# Patient Record
Sex: Male | Born: 1955
Health system: Southern US, Community
[De-identification: ages and names within clinical notes are randomized; demographics above are authoritative.]

## PROBLEM LIST (undated history)

## (undated) DIAGNOSIS — K219 Gastro-esophageal reflux disease without esophagitis: Secondary | ICD-10-CM

## (undated) DIAGNOSIS — M1990 Unspecified osteoarthritis, unspecified site: Secondary | ICD-10-CM

## (undated) DIAGNOSIS — H34211 Partial retinal artery occlusion, right eye: Secondary | ICD-10-CM

## (undated) DIAGNOSIS — I10 Essential (primary) hypertension: Secondary | ICD-10-CM

## (undated) DIAGNOSIS — R519 Headache, unspecified: Secondary | ICD-10-CM

## (undated) DIAGNOSIS — I499 Cardiac arrhythmia, unspecified: Secondary | ICD-10-CM

## (undated) DIAGNOSIS — G510 Bell's palsy: Secondary | ICD-10-CM

## (undated) DIAGNOSIS — R51 Headache: Secondary | ICD-10-CM

## (undated) HISTORY — PX: COLONOSCOPY: SHX174

## (undated) HISTORY — PX: CIRCUMCISION: SUR203

## (undated) HISTORY — PX: JOINT REPLACEMENT: SHX530

---

## 2005-06-06 ENCOUNTER — Emergency Department: Payer: Self-pay | Admitting: Emergency Medicine

## 2007-07-20 ENCOUNTER — Emergency Department: Payer: Self-pay | Admitting: Emergency Medicine

## 2008-09-06 DIAGNOSIS — G510 Bell's palsy: Secondary | ICD-10-CM

## 2008-09-06 HISTORY — DX: Bell's palsy: G51.0

## 2009-07-28 ENCOUNTER — Emergency Department: Payer: Self-pay | Admitting: Emergency Medicine

## 2009-08-20 ENCOUNTER — Emergency Department: Payer: Self-pay | Admitting: Emergency Medicine

## 2010-02-02 ENCOUNTER — Emergency Department: Payer: Self-pay | Admitting: Emergency Medicine

## 2010-02-03 ENCOUNTER — Emergency Department: Payer: Self-pay | Admitting: Emergency Medicine

## 2010-03-27 ENCOUNTER — Emergency Department (HOSPITAL_COMMUNITY): Admission: EM | Admit: 2010-03-27 | Discharge: 2010-03-28 | Payer: Self-pay | Admitting: Emergency Medicine

## 2011-02-28 ENCOUNTER — Emergency Department: Payer: Self-pay | Admitting: Emergency Medicine

## 2012-08-08 ENCOUNTER — Other Ambulatory Visit: Payer: Self-pay | Admitting: Orthopedic Surgery

## 2012-08-25 ENCOUNTER — Encounter (HOSPITAL_COMMUNITY): Admission: RE | Payer: Self-pay | Source: Ambulatory Visit

## 2012-08-25 ENCOUNTER — Ambulatory Visit (HOSPITAL_COMMUNITY): Admission: RE | Admit: 2012-08-25 | Payer: 59 | Source: Ambulatory Visit | Admitting: Orthopedic Surgery

## 2012-08-25 SURGERY — ARTHROPLASTY, KNEE, TOTAL
Anesthesia: General | Laterality: Right

## 2015-01-14 ENCOUNTER — Emergency Department (HOSPITAL_COMMUNITY)
Admission: EM | Admit: 2015-01-14 | Discharge: 2015-01-14 | Disposition: A | Discharge status: Home / Self Care | Payer: No Typology Code available for payment source | Attending: Emergency Medicine | Admitting: Emergency Medicine

## 2015-01-14 ENCOUNTER — Emergency Department (HOSPITAL_COMMUNITY): Payer: No Typology Code available for payment source

## 2015-01-14 ENCOUNTER — Encounter (HOSPITAL_COMMUNITY): Payer: Self-pay | Admitting: *Deleted

## 2015-01-14 DIAGNOSIS — S46911A Strain of unspecified muscle, fascia and tendon at shoulder and upper arm level, right arm, initial encounter: Secondary | ICD-10-CM | POA: Diagnosis not present

## 2015-01-14 DIAGNOSIS — Z7982 Long term (current) use of aspirin: Secondary | ICD-10-CM | POA: Diagnosis not present

## 2015-01-14 DIAGNOSIS — Y9389 Activity, other specified: Secondary | ICD-10-CM | POA: Diagnosis not present

## 2015-01-14 DIAGNOSIS — Y92410 Unspecified street and highway as the place of occurrence of the external cause: Secondary | ICD-10-CM | POA: Insufficient documentation

## 2015-01-14 DIAGNOSIS — Y998 Other external cause status: Secondary | ICD-10-CM | POA: Insufficient documentation

## 2015-01-14 DIAGNOSIS — Z79899 Other long term (current) drug therapy: Secondary | ICD-10-CM | POA: Insufficient documentation

## 2015-01-14 DIAGNOSIS — S161XXA Strain of muscle, fascia and tendon at neck level, initial encounter: Secondary | ICD-10-CM | POA: Diagnosis not present

## 2015-01-14 DIAGNOSIS — S4991XA Unspecified injury of right shoulder and upper arm, initial encounter: Secondary | ICD-10-CM | POA: Diagnosis present

## 2015-01-14 MED ORDER — ORPHENADRINE CITRATE ER 100 MG PO TB12: 100.0000 mg | ORAL_TABLET | Freq: Two times a day (BID) | ORAL | Status: DC | PRN

## 2015-01-14 MED ORDER — HYDROCODONE-ACETAMINOPHEN 5-325 MG PO TABS
2.0000 | ORAL_TABLET | Freq: Once | ORAL | Status: AC
  Administered 2015-01-14: 2 via ORAL
  Filled 2015-01-14: qty 2
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
## 2015-01-14 NOTE — ED Notes (Signed)
Pt is upset that dr Jodi MourningZavitz wouldn't give him prescription for Vicodin. Explained to pt there are multiple ways to manage pain besides narcotics, he voiced understanding.

## 2015-01-14 NOTE — ED Notes (Signed)
Bed: WA14 Expected date:  Expected time:  Means of arrival:  Comments: EMS 

## 2015-01-14 NOTE — Discharge Instructions (Signed)
Take Tylenol as needed for pain every 4 hours. Use ice or heat as needed and tolerated. For muscle spasms or muscle tension try muscle relaxant.  If you were given medicines take as directed.  If you are on coumadin or contraceptives realize their levels and effectiveness is altered by many different medicines.  If you have any reaction (rash, tongues swelling, other) to the medicines stop taking and see a physician.   Please follow up as directed and return to the ER or see a physician for new or worsening symptoms.  Thank you. Filed Vitals:   01/14/15 1633 01/14/15 1640  BP:  153/77  Pulse:  105  Temp:  97.6 F (36.4 C)  TempSrc:  Oral  Resp:  20  SpO2: 98% 96%

## 2015-01-14 NOTE — ED Notes (Signed)
Pt requesting prescription for narcotic pain medications, Dr Jodi MourningZavitz notified.

## 2015-02-26 ENCOUNTER — Emergency Department
Admission: EM | Admit: 2015-02-26 | Discharge: 2015-02-26 | Discharge status: Against Medical Advice | Payer: Self-pay | Attending: Emergency Medicine | Admitting: Emergency Medicine

## 2015-02-26 DIAGNOSIS — M25561 Pain in right knee: Secondary | ICD-10-CM | POA: Insufficient documentation

## 2015-02-26 DIAGNOSIS — M25562 Pain in left knee: Secondary | ICD-10-CM | POA: Insufficient documentation

## 2015-02-26 DIAGNOSIS — M545 Low back pain: Secondary | ICD-10-CM | POA: Insufficient documentation
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
## 2015-02-27 ENCOUNTER — Encounter: Payer: Self-pay | Admitting: Emergency Medicine

## 2015-02-27 ENCOUNTER — Emergency Department
Admission: EM | Admit: 2015-02-27 | Discharge: 2015-02-27 | Disposition: A | Discharge status: Home / Self Care | Payer: Self-pay | Attending: Emergency Medicine | Admitting: Emergency Medicine

## 2015-02-27 DIAGNOSIS — M170 Bilateral primary osteoarthritis of knee: Secondary | ICD-10-CM | POA: Insufficient documentation

## 2015-02-27 DIAGNOSIS — Z7982 Long term (current) use of aspirin: Secondary | ICD-10-CM | POA: Insufficient documentation

## 2015-02-27 DIAGNOSIS — G8929 Other chronic pain: Secondary | ICD-10-CM | POA: Insufficient documentation

## 2015-02-27 DIAGNOSIS — Z79899 Other long term (current) drug therapy: Secondary | ICD-10-CM | POA: Insufficient documentation

## 2015-02-27 DIAGNOSIS — S46911D Strain of unspecified muscle, fascia and tendon at shoulder and upper arm level, right arm, subsequent encounter: Secondary | ICD-10-CM | POA: Insufficient documentation

## 2015-02-27 MED ORDER — PREDNISONE 10 MG PO TABS: 10.0000 mg | ORAL_TABLET | Freq: Two times a day (BID) | ORAL | Status: DC

## 2015-02-27 MED ORDER — MELOXICAM 15 MG PO TABS: 15.0000 mg | ORAL_TABLET | Freq: Every day | ORAL | Status: DC

## 2015-02-27 MED ORDER — TRAMADOL HCL 50 MG PO TABS: 50.0000 mg | ORAL_TABLET | Freq: Two times a day (BID) | ORAL | Status: DC

## 2015-02-27 MED ORDER — ORPHENADRINE CITRATE 30 MG/ML IJ SOLN
60.0000 mg | INTRAMUSCULAR | Status: AC
  Administered 2015-02-27: 60 mg via INTRAMUSCULAR

## 2015-02-27 MED ORDER — KETOROLAC TROMETHAMINE 60 MG/2ML IM SOLN
60.0000 mg | Freq: Once | INTRAMUSCULAR | Status: AC
  Administered 2015-02-27: 60 mg via INTRAMUSCULAR

## 2015-02-27 MED ORDER — KETOROLAC TROMETHAMINE 60 MG/2ML IM SOLN
INTRAMUSCULAR | Status: AC
  Administered 2015-02-27: 60 mg via INTRAMUSCULAR
  Filled 2015-02-27: qty 2

## 2015-02-27 MED ORDER — ORPHENADRINE CITRATE 30 MG/ML IJ SOLN
INTRAMUSCULAR | Status: AC
  Administered 2015-02-27: 60 mg via INTRAMUSCULAR
  Filled 2015-02-27: qty 2
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
## 2015-02-27 NOTE — Discharge Instructions (Signed)
Osteoarthritis Osteoarthritis is a disease that causes soreness and inflammation of a joint. It occurs when the cartilage at the affected joint wears down. Cartilage acts as a cushion, covering the ends of bones where they meet to form a joint. Osteoarthritis is the most common form of arthritis. It often occurs in older people. The joints affected most often by this condition include those in the:  Ends of the fingers.  Thumbs.  Neck.  Lower back.  Knees.  Hips. CAUSES  Over time, the cartilage that covers the ends of bones begins to wear away. This causes bone to rub on bone, producing pain and stiffness in the affected joints.  RISK FACTORS Certain factors can increase your chances of having osteoarthritis, including:  Older age.  Excessive body weight.  Overuse of joints.  Previous joint injury. SIGNS AND SYMPTOMS   Pain, swelling, and stiffness in the joint.  Over time, the joint may lose its normal shape.  Small deposits of bone (osteophytes) may grow on the edges of the joint.  Bits of bone or cartilage can break off and float inside the joint space. This may cause more pain and damage. DIAGNOSIS  Your health care provider will do a physical exam and ask about your symptoms. Various tests may be ordered, such as:  X-rays of the affected joint.  An MRI scan.  Blood tests to rule out other types of arthritis.  Joint fluid tests. This involves using a needle to draw fluid from the joint and examining the fluid under a microscope. TREATMENT  Goals of treatment are to control pain and improve joint function. Treatment plans may include:  A prescribed exercise program that allows for rest and joint relief.  A weight control plan.  Pain relief techniques, such as:  Properly applied heat and cold.  Electric pulses delivered to nerve endings under the skin (transcutaneous electrical nerve stimulation [TENS]).  Massage.  Certain nutritional  supplements.  Medicines to control pain, such as:  Acetaminophen.  Nonsteroidal anti-inflammatory drugs (NSAIDs), such as naproxen.  Narcotic or central-acting agents, such as tramadol.  Corticosteroids. These can be given orally or as an injection.  Surgery to reposition the bones and relieve pain (osteotomy) or to remove loose pieces of bone and cartilage. Joint replacement may be needed in advanced states of osteoarthritis. HOME CARE INSTRUCTIONS   Take medicines only as directed by your health care provider.  Maintain a healthy weight. Follow your health care provider's instructions for weight control. This may include dietary instructions.  Exercise as directed. Your health care provider can recommend specific types of exercise. These may include:  Strengthening exercises. These are done to strengthen the muscles that support joints affected by arthritis. They can be performed with weights or with exercise bands to add resistance.  Aerobic activities. These are exercises, such as brisk walking or low-impact aerobics, that get your heart pumping.  Range-of-motion activities. These keep your joints limber.  Balance and agility exercises. These help you maintain daily living skills.  Rest your affected joints as directed by your health care provider.  Keep all follow-up visits as directed by your health care provider. SEEK MEDICAL CARE IF:   Your skin turns red.  You develop a rash in addition to your joint pain.  You have worsening joint pain.  You have a fever along with joint or muscle aches. SEEK IMMEDIATE MEDICAL CARE IF: 1. You have a significant loss of weight or appetite. 2. You have night sweats. FOR MORE  INFORMATION  1. [employer] of Arthritis and Musculoskeletal and Skin Diseases: www.niams.http://www.myers.net/ 2. [employer] on Aging: https://walker.com/ 3. [employer] of Rheumatology: www.rheumatology.org Document Released: 08/23/2005 Document  Revised: 01/07/2014 Document Reviewed: 04/30/2013 [facility] Patient Information 2015 [place], [place]. This information is not intended to replace advice given to you by your health care provider. Make sure you discuss any questions you have with your health care provider.   Shoulder Sprain A shoulder sprain is the result of damage to the tough, fiber-like tissues (ligaments) that help hold your shoulder in place. The ligaments may be stretched or torn. Besides the main shoulder joint (the ball and socket), there are several smaller joints that connect the bones in this area. A sprain usually involves one of those joints. Most often it is the acromioclavicular (or AC) joint. That is the joint that connects the collarbone (clavicle) and the shoulder blade (scapula) at the top point of the shoulder blade (acromion). A shoulder sprain is a mild form of what is called a shoulder separation. Recovering from a shoulder sprain may take some time. For some, pain lingers for several months. Most people recover without long term problems. CAUSES   A shoulder sprain is usually caused by some kind of trauma. This might be:  Falling on an outstretched arm.  Being hit hard on the shoulder.  Twisting the arm.  Shoulder sprains are more likely to occur in people who:  Play sports.  Have balance or coordination problems. SYMPTOMS   Pain when you move your shoulder.  Limited ability to move the shoulder.  Swelling and tenderness on top of the shoulder.  Redness or warmth in the shoulder.  Bruising.  A change in the shape of the shoulder. DIAGNOSIS  Your healthcare provider may:  Ask about your symptoms.  Ask about recent activity that might have caused those symptoms.  Examine your shoulder. You may be asked to do simple exercises to test movement. The other shoulder will be examined for comparison.  Order some tests that provide a look inside the body. They can show the extent of the injury.  The tests could include:  X-rays.  CT (computed tomography) scan.  MRI (magnetic resonance imaging) scan. RISKS AND COMPLICATIONS  Loss of full shoulder motion.  Ongoing shoulder pain. TREATMENT  How long it takes to recover from a shoulder sprain depends on how severe it was. Treatment options may include:  Rest. You should not use the arm or shoulder until it heals.  Ice. For 2 or 3 days after the injury, put an ice pack on the shoulder up to 4 times a day. It should stay on for 15 to 20 minutes each time. Wrap the ice in a towel so it does not touch your skin.  Over-the-counter medicine to relieve pain.  A sling or brace. This will keep the arm still while the shoulder is healing.  Physical therapy or rehabilitation exercises. These will help you regain strength and motion. Ask your healthcare provider when it is OK to begin these exercises.  Surgery. The need for surgery is rare with a sprained shoulder, but some people may need surgery to keep the joint in place and reduce pain. HOME CARE INSTRUCTIONS   Ask your healthcare provider about what you should and should not do while your shoulder heals.  Make sure you know how to apply ice to the correct area of your shoulder.  Talk with your healthcare provider about which medications should be used for pain  and swelling.  If rehabilitation therapy will be needed, ask your healthcare provider to refer you to a therapist. If it is not recommended, then ask about at-home exercises. Find out when exercise should begin. SEEK MEDICAL CARE IF:  Your pain, swelling, or redness at the joint increases. SEEK IMMEDIATE MEDICAL CARE IF:   You have a fever.  You cannot move your arm or shoulder. Document Released: 01/09/2009 Document Revised: 11/15/2011 Document Reviewed: 01/09/2009 [facility] Patient Information 2015 [place], [place]. This information is not intended to replace advice given to you by your health care provider. Make sure  you discuss any questions you have with your health care provider.  Take the prescription meds as directed.  Take the Prednisone until complete, then start the Meloxicam daily for arthritis pain. Rest and ice the joints for comfort. Determine eligibility for Medicaid benefits, then follow-up with Dr. Luiz Blare for re-evaluation for surgery.

## 2015-03-18 ENCOUNTER — Other Ambulatory Visit (HOSPITAL_COMMUNITY): Payer: Self-pay | Admitting: Orthopaedic Surgery

## 2015-03-18 DIAGNOSIS — M25511 Pain in right shoulder: Secondary | ICD-10-CM

## 2015-03-27 ENCOUNTER — Ambulatory Visit (HOSPITAL_COMMUNITY)
Admission: RE | Admit: 2015-03-27 | Discharge: 2015-03-27 | Disposition: A | Discharge status: Home / Self Care | Payer: No Typology Code available for payment source | Source: Ambulatory Visit | Attending: Orthopaedic Surgery | Admitting: Orthopaedic Surgery

## 2015-03-27 DIAGNOSIS — M25511 Pain in right shoulder: Secondary | ICD-10-CM

## 2015-04-04 ENCOUNTER — Emergency Department
Admission: EM | Admit: 2015-04-04 | Discharge: 2015-04-04 | Disposition: A | Discharge status: Home / Self Care | Payer: Self-pay | Attending: Emergency Medicine | Admitting: Emergency Medicine

## 2015-04-04 DIAGNOSIS — Z79899 Other long term (current) drug therapy: Secondary | ICD-10-CM | POA: Insufficient documentation

## 2015-04-04 DIAGNOSIS — Z791 Long term (current) use of non-steroidal anti-inflammatories (NSAID): Secondary | ICD-10-CM | POA: Insufficient documentation

## 2015-04-04 DIAGNOSIS — L259 Unspecified contact dermatitis, unspecified cause: Secondary | ICD-10-CM | POA: Insufficient documentation

## 2015-04-04 DIAGNOSIS — L509 Urticaria, unspecified: Secondary | ICD-10-CM | POA: Insufficient documentation

## 2015-04-04 DIAGNOSIS — Z7982 Long term (current) use of aspirin: Secondary | ICD-10-CM | POA: Insufficient documentation

## 2015-04-04 MED ORDER — RANITIDINE HCL 150 MG PO TABS: 150.0000 mg | ORAL_TABLET | Freq: Two times a day (BID) | ORAL | Status: DC

## 2015-04-04 MED ORDER — DEXAMETHASONE SODIUM PHOSPHATE 10 MG/ML IJ SOLN
INTRAMUSCULAR | Status: AC
  Administered 2015-04-04: 4 mg
  Filled 2015-04-04: qty 1

## 2015-04-04 MED ORDER — FAMOTIDINE 20 MG PO TABS
20.0000 mg | ORAL_TABLET | Freq: Once | ORAL | Status: AC
  Administered 2015-04-04: 20 mg via ORAL
  Filled 2015-04-04: qty 1

## 2015-04-04 MED ORDER — DIPHENHYDRAMINE HCL 25 MG PO CAPS
ORAL_CAPSULE | ORAL | Status: AC
  Filled 2015-04-04: qty 1

## 2015-04-04 MED ORDER — HYDROXYZINE PAMOATE 25 MG PO CAPS: 25.0000 mg | ORAL_CAPSULE | Freq: Three times a day (TID) | ORAL | Status: DC | PRN

## 2015-04-04 MED ORDER — DEXAMETHASONE SODIUM PHOSPHATE 4 MG/ML IJ SOLN: 4.0000 mg | Freq: Once | INTRAMUSCULAR | Status: AC

## 2015-04-04 MED ORDER — PREDNISONE 10 MG PO TABS: 50.0000 mg | ORAL_TABLET | Freq: Every day | ORAL | Status: DC

## 2015-04-04 MED ORDER — DIPHENHYDRAMINE HCL 25 MG PO CAPS
25.0000 mg | ORAL_CAPSULE | Freq: Once | ORAL | Status: AC
  Administered 2015-04-04: 25 mg via ORAL

## 2015-04-04 MED ORDER — DIPHENHYDRAMINE HCL 50 MG/ML IJ SOLN
50.0000 mg | Freq: Once | INTRAMUSCULAR | Status: AC
  Administered 2015-04-04: 50 mg via INTRAMUSCULAR
  Filled 2015-04-04: qty 1

## 2015-04-04 NOTE — ED Provider Notes (Signed)
[facility] Emergency Department Provider Note  ____________________________________________  Time seen: Approximately 10:29 PM  I have reviewed the triage vital signs and the nursing notes.   HISTORY  Chief Complaint Rash    HPI Walter Hodges is a 59 y.o. male who presents for evaluation of eye itching erythematous rash in the groin and lower abdomen both arms and upper chest. Patient states that he is recently changed his soaps. Denies any shortness of breath chest pains or difficulty breathing.   No past medical history on file.  There are no active problems to display for this patient.   No past surgical history on file.  Current Outpatient Rx  Name  Route  Sig  Dispense  Refill  . aspirin EC 325 MG tablet   Oral   Take 325 mg by mouth daily.         . hydrochlorothiazide (HYDRODIURIL) 25 MG tablet   Oral   Take 25 mg by mouth daily.         . hydrOXYzine (VISTARIL) 25 MG capsule   Oral   Take 1 capsule (25 mg total) by mouth 3 (three) times daily as needed.   30 capsule   0   . lisinopril (PRINIVIL,ZESTRIL) 10 MG tablet   Oral   Take 10 mg by mouth daily.         . meloxicam (MOBIC) 15 MG tablet   Oral   Take 1 tablet (15 mg total) by mouth daily.   30 tablet   1   . orphenadrine (NORFLEX) 100 MG tablet   Oral   Take 1 tablet (100 mg total) by mouth 2 (two) times daily as needed for muscle spasms.   10 tablet   0   . predniSONE (DELTASONE) 10 MG tablet   Oral   Take 5 tablets (50 mg total) by mouth daily with breakfast.   25 tablet   0   . ranitidine (ZANTAC) 150 MG tablet   Oral   Take 1 tablet (150 mg total) by mouth 2 (two) times daily.   14 tablet   0   . traMADol (ULTRAM) 50 MG tablet   Oral   Take 1 tablet (50 mg total) by mouth 2 (two) times daily.   20 tablet   0     Allergies Review of patient's allergies indicates no known allergies.  No family history on file.  Social History History   Substance Use Topics  . Smoking status: Never Smoker   . Smokeless tobacco: Not on file  . Alcohol Use: No    Review of Systems Constitutional: No fever/chills Eyes: No visual changes. ENT: No sore throat. Cardiovascular: Denies chest pain. Respiratory: Denies shortness of breath. Gastrointestinal: No abdominal pain.  No nausea, no vomiting.  No diarrhea.  No constipation. Genitourinary: Negative for dysuria. Musculoskeletal: Negative for back pain. Skin: Positive for skin rash. Neurological: Negative for headaches, focal weakness or numbness.  10-point ROS otherwise negative.  ____________________________________________   PHYSICAL EXAM:  VITAL SIGNS: ED Triage Vitals  Enc Vitals Group     BP 04/04/15 2213 152/78 mmHg     Pulse Rate 04/04/15 2213 80     Resp 04/04/15 2213 16     Temp 04/04/15 2213 98.2 F (36.8 C)     Temp Source 04/04/15 2213 Oral     SpO2 04/04/15 2213 100 %     Weight 04/04/15 2213 248 lb (112.492 kg)     Height 04/04/15 2213  5\' 10"  (1.778 m)     Head Cir --      Peak Flow --      Pain Score --      Pain Loc --      Pain Edu? --      Excl. in GC? --     Constitutional: Alert and oriented. Well appearing and in no acute distress. Eyes: Conjunctivae are normal. PERRL. EOMI. Head: Atraumatic. Nose: No congestion/rhinnorhea. Mouth/Throat: Mucous membranes are moist.  Oropharynx non-erythematous. Neck: No stridor.   Cardiovascular: Normal rate, regular rhythm. Grossly normal heart sounds.  Good peripheral circulation. Respiratory: Normal respiratory effort.  No retractions. Lungs CTAB. Gastrointestinal: Soft and nontender. No distention. No abdominal bruits. No CVA tenderness. Musculoskeletal: No lower extremity tenderness nor edema.  No joint effusions. Neurologic:  Normal speech and language. No gross focal neurologic deficits are appreciated. No gait instability. Skin:  Skin is warm, dry and intact. No rash noted. Psychiatric: Mood and  affect are normal. Speech and behavior are normal.  ____________________________________________   LABS (all labs ordered are listed, but only abnormal results are displayed)  Labs Reviewed - No data to display    PROCEDURES  Procedure(s) performed: None  Critical Care performed: No  ____________________________________________   INITIAL IMPRESSION / ASSESSMENT AND PLAN / ED COURSE  Pertinent labs & imaging results that were available during my care of the patient were reviewed by me and considered in my medical decision making (see chart for details). Contact dermatitis/acute urticaria. There are Benadryl 50 mg IM, and on 4 mg IM and intact 150 by mouth given while in the ED. Rx given for prednisone, Vistaril, and Zantac. Patient to follow-up or return to the ER with any worsening symptomology. Patient voices no other emergency medical complaints at this visit. ____________________________________________   FINAL CLINICAL IMPRESSION(S) / ED DIAGNOSES  Final diagnoses:  Urticaria  Contact dermatitis      Evangeline Dakin, PA-C 04/04/15 2256  Arnaldo Natal, MD 04/04/15 2324

## 2015-04-04 NOTE — ED Notes (Signed)
Pt to ed from home due to rash on abd, arms and genitals area that's been going on for about 2 days now thinks it might be from changing the soaps.

## 2015-04-04 NOTE — ED Notes (Signed)
Pt states has itchy red rash that is confluent to groin, lower abd, bilateral arms, lateral upper chest. Pt states has recently changed soaps. Pt without shob, no difficulty swallowing.

## 2015-04-04 NOTE — Discharge Instructions (Signed)
Contact Dermatitis °Contact dermatitis is a reaction to certain substances that touch the skin. Contact dermatitis can be either irritant contact dermatitis or allergic contact dermatitis. Irritant contact dermatitis does not require previous exposure to the substance for a reaction to occur. Allergic contact dermatitis only occurs if you have been exposed to the substance before. Upon a repeat exposure, your body reacts to the substance.  °CAUSES  °Many substances can cause contact dermatitis. Irritant dermatitis is most commonly caused by repeated exposure to mildly irritating substances, such as: °· Makeup. °· Soaps. °· Detergents. °· Bleaches. °· Acids. °· Metal salts, such as nickel. °Allergic contact dermatitis is most commonly caused by exposure to: °· Poisonous plants. °· Chemicals (deodorants, shampoos). °· Jewelry. °· Latex. °· Neomycin in triple antibiotic cream. °· Preservatives in products, including clothing. °SYMPTOMS  °The area of skin that is exposed may develop: °· Dryness or flaking. °· Redness. °· Cracks. °· Itching. °· Pain or a burning sensation. °· Blisters. °With allergic contact dermatitis, there may also be swelling in areas such as the eyelids, mouth, or genitals.  °DIAGNOSIS  °Your caregiver can usually tell what the problem is by doing a physical exam. In cases where the cause is uncertain and an allergic contact dermatitis is suspected, a patch skin test may be performed to help determine the cause of your dermatitis. °TREATMENT °Treatment includes protecting the skin from further contact with the irritating substance by avoiding that substance if possible. Barrier creams, powders, and gloves may be helpful. Your caregiver may also recommend: °· Steroid creams or ointments applied 2 times daily. For best results, soak the rash area in cool water for 20 minutes. Then apply the medicine. Cover the area with a plastic wrap. You can store the steroid cream in the refrigerator for a "chilly"  effect on your rash. That may decrease itching. Oral steroid medicines may be needed in more severe cases. °· Antibiotics or antibacterial ointments if a skin infection is present. °· Antihistamine lotion or an antihistamine taken by mouth to ease itching. °· Lubricants to keep moisture in your skin. °· Burow's solution to reduce redness and soreness or to dry a weeping rash. Mix one packet or tablet of solution in 2 cups cool water. Dip a clean washcloth in the mixture, wring it out a bit, and put it on the affected area. Leave the cloth in place for 30 minutes. Do this as often as possible throughout the day. °· Taking several cornstarch or baking soda baths daily if the area is too large to cover with a washcloth. °Harsh chemicals, such as alkalis or acids, can cause skin damage that is like a burn. You should flush your skin for 15 to 20 minutes with cold water after such an exposure. You should also seek immediate medical care after exposure. Bandages (dressings), antibiotics, and pain medicine may be needed for severely irritated skin.  °HOME CARE INSTRUCTIONS °· Avoid the substance that caused your reaction. °· Keep the area of skin that is affected away from hot water, soap, sunlight, chemicals, acidic substances, or anything else that would irritate your skin. °· Do not scratch the rash. Scratching may cause the rash to become infected. °· You may take cool baths to help stop the itching. °· Only take over-the-counter or prescription medicines as directed by your caregiver. °· See your caregiver for follow-up care as directed to make sure your skin is healing properly. °SEEK MEDICAL CARE IF:  °· Your condition is not better after 3   days of treatment. °· You seem to be getting worse. °· You see signs of infection such as swelling, tenderness, redness, soreness, or warmth in the affected area. °· You have any problems related to your medicines. °Document Released: 08/20/2000 Document Revised: 11/15/2011  Document Reviewed: 01/26/2011 °ExitCare® Patient Information ©2015 [employer]. This information is not intended to replace advice given to you by your health care provider. Make sure you discuss any questions you have with your health care provider. °Hives °Hives are itchy, red, swollen areas of the skin. They can vary in size and location on your body. Hives can come and go for hours or several days (acute hives) or for several weeks (chronic hives). Hives do not spread from person to person (noncontagious). They may get worse with scratching, exercise, and emotional stress. °CAUSES  °Allergic reaction to food, additives, or drugs. °Infections, including the common cold. °Illness, such as vasculitis, lupus, or thyroid disease. °Exposure to sunlight, heat, or cold. °Exercise. °Stress. °Contact with chemicals. °SYMPTOMS  °Red or white swollen patches on the skin. The patches may change size, shape, and location quickly and repeatedly. °Itching. °Swelling of the hands, feet, and face. This may occur if hives develop deeper in the skin. °DIAGNOSIS  °Your caregiver can usually tell what is wrong by performing a physical exam. Skin or blood tests may also be done to determine the cause of your hives. In some cases, the cause cannot be determined. °TREATMENT  °Mild cases usually get better with medicines such as antihistamines. Severe cases may require an emergency epinephrine injection. If the cause of your hives is known, treatment includes avoiding that trigger.  °HOME CARE INSTRUCTIONS  °Avoid causes that trigger your hives. °Take antihistamines as directed by your caregiver to reduce the severity of your hives. Non-sedating or low-sedating antihistamines are usually recommended. Do not drive while taking an antihistamine. °Take any other medicines prescribed for itching as directed by your caregiver. °Wear loose-fitting clothing. °Keep all follow-up appointments as directed by your caregiver. °SEEK MEDICAL CARE  IF:  °You have persistent or severe itching that is not relieved with medicine. °You have painful or swollen joints. °SEEK IMMEDIATE MEDICAL CARE IF:  °You have a fever. °Your tongue or lips are swollen. °You have trouble breathing or swallowing. °You feel tightness in the throat or chest. °You have abdominal pain. °These problems may be the first sign of a life-threatening allergic reaction. Call your local emergency services (911 in [place] °MAKE SURE YOU:  °Understand these instructions. °Will watch your condition. °Will get help right away if you are not doing well or get worse. °Document Released: 08/23/2005 Document Revised: 08/28/2013 Document Reviewed: 11/16/2011 °ExitCare® Patient Information ©2015 [employer]. This information is not intended to replace advice given to you by your health care provider. Make sure you discuss any questions you have with your health care provider. ° °

## 2015-04-08 ENCOUNTER — Ambulatory Visit (HOSPITAL_COMMUNITY): Admission: RE | Admit: 2015-04-08 | Payer: No Typology Code available for payment source | Source: Ambulatory Visit

## 2015-04-09 ENCOUNTER — Ambulatory Visit (HOSPITAL_COMMUNITY): Admission: RE | Admit: 2015-04-09 | Payer: No Typology Code available for payment source | Source: Ambulatory Visit

## 2015-04-18 ENCOUNTER — Ambulatory Visit (HOSPITAL_COMMUNITY)
Admission: RE | Admit: 2015-04-18 | Discharge: 2015-04-18 | Disposition: A | Discharge status: Home / Self Care | Payer: Self-pay | Source: Ambulatory Visit | Attending: Orthopaedic Surgery | Admitting: Orthopaedic Surgery

## 2015-04-18 DIAGNOSIS — M25511 Pain in right shoulder: Secondary | ICD-10-CM | POA: Insufficient documentation

## 2015-04-18 DIAGNOSIS — M129 Arthropathy, unspecified: Secondary | ICD-10-CM | POA: Insufficient documentation

## 2015-04-18 DIAGNOSIS — M94211 Chondromalacia, right shoulder: Secondary | ICD-10-CM | POA: Insufficient documentation

## 2015-04-24 ENCOUNTER — Other Ambulatory Visit (HOSPITAL_COMMUNITY): Payer: Self-pay | Admitting: Orthopaedic Surgery

## 2015-05-15 NOTE — Pre-Procedure Instructions (Signed)
Walter Hodges  05/15/2015      [facility] - [place], [place] - [address] [place] [address] Phone: [phone] Fax: [phone]    Your procedure is scheduled on Monday, September 19th .  Report to [facility] Admitting at 10:30 AM  Call this number if you have problems the morning of surgery:  [phone]   Remember:  Do not eat food or drink liquids after midnight Sunday.            Take these medicines the morning of surgery with A SIP OF WATER :  No Medicine               Please STOP taking any herbal medications/supplements and anti-inflammatories ( DONOT TAKE MOTRIN AFTER 05/20/2015)   4-5 days prior to surgery.   Do not wear jewelry - no rings or watches.   Do not wear lotions or colognes.   You may NOT wear deodorant the day of surgery.              Men may shave face and neck.   Do not bring valuables to the hospital.   [facility] is not responsible for any belongings or valuables.  Contacts, dentures or bridgework may not be worn into surgery.  Leave your suitcase in the car.  After surgery it may be brought to your room.  For patients admitted to the hospital, discharge time will be determined by your treatment team.  Patients discharged the day of surgery will not be allowed to drive home.    Name and phone number of your driver:     Special instructions:  Special Instructions: [facility] - Preparing for Surgery  Before surgery, you can play an important role.  Because skin is not sterile, your skin needs to be as free of germs as possible.  You can reduce the number of germs on you skin by washing with CHG (chlorahexidine gluconate) soap before surgery.  CHG is an antiseptic cleaner which kills germs and bonds with the skin to continue killing germs even after washing.  Please DO NOT use if you have an allergy to CHG or antibacterial soaps.  If your skin becomes reddened/irritated stop  using the CHG and inform your nurse when you arrive at Short Stay.  Do not shave (including legs and underarms) for at least 48 hours prior to the first CHG shower.  You may shave your face.  Please follow these instructions carefully:   1.  Shower with CHG Soap the night before surgery and the  morning of Surgery.  2.  If you choose to wash your hair, wash your hair first as usual with your  normal shampoo.  3.  After you shampoo, rinse your hair and body thoroughly to remove the  Shampoo.  4.  Use CHG as you would any other liquid soap.  You can apply chg directly to the skin and wash gently with scrungie or a clean washcloth.  5.  Apply the CHG Soap to your body ONLY FROM THE NECK DOWN.    Do not use on open wounds or open sores.  Avoid contact with your eyes, ears, mouth and genitals (private parts).  Wash genitals (private parts)   with your normal soap.  6.  Wash thoroughly, paying special attention to the area where your surgery will be performed.  7.  Thoroughly rinse your body with warm water from the neck down.  8.  DO NOT shower/wash with your normal soap after using and rinsing off   the CHG Soap.  9.  Pat yourself dry with a clean towel.            10.  Wear clean pajamas.            11.  Place clean sheets on your bed the night of your first shower and do not sleep with pets.  Day of Surgery  Do not apply any lotions/deodorants the morning of surgery.  Please wear clean clothes to the hospital/surgery center.  Please read over the following fact sheets that you were given. Pain Booklet, Coughing and Deep Breathing, Blood Transfusion Information, MRSA Information and Surgical Site Infection Prevention

## 2015-05-16 ENCOUNTER — Encounter (HOSPITAL_COMMUNITY): Payer: Self-pay

## 2015-05-16 ENCOUNTER — Encounter (HOSPITAL_COMMUNITY)
Admission: RE | Admit: 2015-05-16 | Discharge: 2015-05-16 | Disposition: A | Discharge status: Home / Self Care | Payer: Self-pay | Source: Ambulatory Visit | Attending: Orthopaedic Surgery | Admitting: Orthopaedic Surgery

## 2015-05-16 DIAGNOSIS — Z0183 Encounter for blood typing: Secondary | ICD-10-CM | POA: Insufficient documentation

## 2015-05-16 DIAGNOSIS — M179 Osteoarthritis of knee, unspecified: Secondary | ICD-10-CM | POA: Insufficient documentation

## 2015-05-16 DIAGNOSIS — R9431 Abnormal electrocardiogram [ECG] [EKG]: Secondary | ICD-10-CM | POA: Insufficient documentation

## 2015-05-16 DIAGNOSIS — Z01812 Encounter for preprocedural laboratory examination: Secondary | ICD-10-CM | POA: Insufficient documentation

## 2015-05-16 HISTORY — DX: Bell's palsy: G51.0

## 2015-05-16 HISTORY — DX: Unspecified osteoarthritis, unspecified site: M19.90

## 2015-05-16 HISTORY — DX: Essential (primary) hypertension: I10

## 2015-05-16 LAB — COMPREHENSIVE METABOLIC PANEL
ALT: 27 U/L (ref 17–63)
ANION GAP: 8 (ref 5–15)
AST: 22 U/L (ref 15–41)
Albumin: 4 g/dL (ref 3.5–5.0)
Alkaline Phosphatase: 63 U/L (ref 38–126)
BUN: 18 mg/dL (ref 6–20)
CHLORIDE: 104 mmol/L (ref 101–111)
CO2: 28 mmol/L (ref 22–32)
Calcium: 9 mg/dL (ref 8.9–10.3)
Creatinine, Ser: 0.8 mg/dL (ref 0.61–1.24)
Glucose, Bld: 104 mg/dL — ABNORMAL HIGH (ref 65–99)
POTASSIUM: 3.4 mmol/L — AB (ref 3.5–5.1)
Sodium: 140 mmol/L (ref 135–145)
Total Bilirubin: 0.7 mg/dL (ref 0.3–1.2)
Total Protein: 7.3 g/dL (ref 6.5–8.1)

## 2015-05-16 LAB — CBC WITH DIFFERENTIAL/PLATELET
BASOS PCT: 0 % (ref 0–1)
Basophils Absolute: 0 10*3/uL (ref 0.0–0.1)
EOS ABS: 0.1 10*3/uL (ref 0.0–0.7)
Eosinophils Relative: 2 % (ref 0–5)
HEMATOCRIT: 41.4 % (ref 39.0–52.0)
HEMOGLOBIN: 13.8 g/dL (ref 13.0–17.0)
Lymphocytes Relative: 40 % (ref 12–46)
Lymphs Abs: 2.4 10*3/uL (ref 0.7–4.0)
MCH: 28.3 pg (ref 26.0–34.0)
MCHC: 33.3 g/dL (ref 30.0–36.0)
MCV: 84.8 fL (ref 78.0–100.0)
Monocytes Absolute: 0.6 10*3/uL (ref 0.1–1.0)
Monocytes Relative: 9 % (ref 3–12)
NEUTROS ABS: 2.9 10*3/uL (ref 1.7–7.7)
NEUTROS PCT: 49 % (ref 43–77)
Platelets: 238 10*3/uL (ref 150–400)
RBC: 4.88 MIL/uL (ref 4.22–5.81)
RDW: 13.6 % (ref 11.5–15.5)
WBC: 6 10*3/uL (ref 4.0–10.5)

## 2015-05-16 LAB — URINALYSIS, ROUTINE W REFLEX MICROSCOPIC
BILIRUBIN URINE: NEGATIVE
GLUCOSE, UA: NEGATIVE mg/dL
Hgb urine dipstick: NEGATIVE
KETONES UR: NEGATIVE mg/dL
LEUKOCYTES UA: NEGATIVE
Nitrite: NEGATIVE
PH: 6.5 (ref 5.0–8.0)
Protein, ur: NEGATIVE mg/dL
SPECIFIC GRAVITY, URINE: 1.022 (ref 1.005–1.030)
Urobilinogen, UA: 1 mg/dL (ref 0.0–1.0)

## 2015-05-16 LAB — SEDIMENTATION RATE: SED RATE: 8 mm/h (ref 0–16)

## 2015-05-16 LAB — SURGICAL PCR SCREEN
MRSA, PCR: NEGATIVE
Staphylococcus aureus: NEGATIVE

## 2015-05-16 LAB — C-REACTIVE PROTEIN

## 2015-05-16 LAB — PROTIME-INR
INR: 1.15 (ref 0.00–1.49)
PROTHROMBIN TIME: 14.9 s (ref 11.6–15.2)

## 2015-05-16 LAB — TYPE AND SCREEN
ABO/RH(D): A POS
ANTIBODY SCREEN: NEGATIVE

## 2015-05-16 LAB — ABO/RH: ABO/RH(D): A POS

## 2015-05-16 LAB — APTT: aPTT: 29 seconds (ref 24–37)

## 2015-05-16 NOTE — Progress Notes (Signed)
Pt. Reports that he is followed by Dr. Nita Sells at [address] , denies having an EKG in the past yr.  Pt. Denies ever having any advanced cardiac testing.

## 2015-05-16 NOTE — Progress Notes (Signed)
Call to interactive resource center where the med. Clinic is located, when called the med. Clinic, there was a recording. Do not know last name of "Dr. Nita Sells". The effort to find the PCP clinic was relative to sending sleep apnea scoring, but I was not successful in getting a fax no. To  Send to.

## 2015-05-16 NOTE — Progress Notes (Signed)
Call to Pharm. Tech for fine tuning of  med. List, pt. Reports that he absolutely doesn't take any meds. Except Lisinopril/ wHCTZ. Then later in the interview he reported use of Motrin . PRN

## 2015-05-26 ENCOUNTER — Encounter (HOSPITAL_COMMUNITY)
Admission: RE | Disposition: A | Discharge status: Home Health | Payer: Self-pay | Source: Ambulatory Visit | Attending: Orthopaedic Surgery

## 2015-05-26 ENCOUNTER — Inpatient Hospital Stay (HOSPITAL_COMMUNITY): Payer: Self-pay

## 2015-05-26 ENCOUNTER — Inpatient Hospital Stay (HOSPITAL_COMMUNITY)
Admission: RE | Admit: 2015-05-26 | Discharge: 2015-05-30 | DRG: 470 | Disposition: A | Discharge status: Home Health | Payer: Self-pay | Source: Ambulatory Visit | Attending: Orthopaedic Surgery | Admitting: Orthopaedic Surgery

## 2015-05-26 ENCOUNTER — Inpatient Hospital Stay (HOSPITAL_COMMUNITY): Payer: Self-pay | Admitting: Anesthesiology

## 2015-05-26 ENCOUNTER — Encounter (HOSPITAL_COMMUNITY): Payer: Self-pay | Admitting: Surgery

## 2015-05-26 DIAGNOSIS — Z79899 Other long term (current) drug therapy: Secondary | ICD-10-CM

## 2015-05-26 DIAGNOSIS — D62 Acute posthemorrhagic anemia: Secondary | ICD-10-CM | POA: Diagnosis not present

## 2015-05-26 DIAGNOSIS — Z96659 Presence of unspecified artificial knee joint: Secondary | ICD-10-CM

## 2015-05-26 DIAGNOSIS — M1711 Unilateral primary osteoarthritis, right knee: Principal | ICD-10-CM | POA: Diagnosis present

## 2015-05-26 DIAGNOSIS — Z96651 Presence of right artificial knee joint: Secondary | ICD-10-CM

## 2015-05-26 DIAGNOSIS — Z7982 Long term (current) use of aspirin: Secondary | ICD-10-CM

## 2015-05-26 DIAGNOSIS — I10 Essential (primary) hypertension: Secondary | ICD-10-CM | POA: Diagnosis present

## 2015-05-26 HISTORY — PX: TOTAL KNEE ARTHROPLASTY: SHX125

## 2015-05-26 SURGERY — ARTHROPLASTY, KNEE, TOTAL
Anesthesia: Monitor Anesthesia Care | Site: Knee | Laterality: Right

## 2015-05-26 MED ORDER — MIDAZOLAM HCL 2 MG/2ML IJ SOLN
INTRAMUSCULAR | Status: AC
  Filled 2015-05-26: qty 4

## 2015-05-26 MED ORDER — BUPIVACAINE LIPOSOME 1.3 % IJ SUSP
20.0000 mL | Freq: Once | INTRAMUSCULAR | Status: DC
  Filled 2015-05-26: qty 20

## 2015-05-26 MED ORDER — PROMETHAZINE HCL 25 MG/ML IJ SOLN: 6.2500 mg | INTRAMUSCULAR | Status: DC | PRN

## 2015-05-26 MED ORDER — DEXTROSE 5 % IV SOLN
500.0000 mg | Freq: Four times a day (QID) | INTRAVENOUS | Status: DC | PRN
  Filled 2015-05-26: qty 5

## 2015-05-26 MED ORDER — METOCLOPRAMIDE HCL 5 MG/ML IJ SOLN: 5.0000 mg | Freq: Three times a day (TID) | INTRAMUSCULAR | Status: DC | PRN

## 2015-05-26 MED ORDER — PROPOFOL 10 MG/ML IV BOLUS
INTRAVENOUS | Status: DC | PRN
  Administered 2015-05-26: 20 mg via INTRAVENOUS

## 2015-05-26 MED ORDER — HYDROMORPHONE HCL 1 MG/ML IJ SOLN
0.2500 mg | INTRAMUSCULAR | Status: DC | PRN
  Administered 2015-05-26 (×2): 0.5 mg via INTRAVENOUS

## 2015-05-26 MED ORDER — BUPIVACAINE LIPOSOME 1.3 % IJ SUSP
INTRAMUSCULAR | Status: DC | PRN
  Administered 2015-05-26: 20 mL

## 2015-05-26 MED ORDER — LACTATED RINGERS IV SOLN
INTRAVENOUS | Status: DC | PRN
  Administered 2015-05-26 (×2): via INTRAVENOUS

## 2015-05-26 MED ORDER — LISINOPRIL-HYDROCHLOROTHIAZIDE 20-12.5 MG PO TABS: 2.0000 | ORAL_TABLET | Freq: Every day | ORAL | Status: DC

## 2015-05-26 MED ORDER — SODIUM CHLORIDE 0.9 % IR SOLN
Status: DC | PRN
  Administered 2015-05-26: 3000 mL

## 2015-05-26 MED ORDER — BUPIVACAINE-EPINEPHRINE (PF) 0.5% -1:200000 IJ SOLN
INTRAMUSCULAR | Status: DC | PRN
  Administered 2015-05-26: 30 mL via PERINEURAL

## 2015-05-26 MED ORDER — PROMETHAZINE HCL 25 MG PO TABS: 25.0000 mg | ORAL_TABLET | Freq: Four times a day (QID) | ORAL | Status: DC | PRN

## 2015-05-26 MED ORDER — HYDROCHLOROTHIAZIDE 25 MG PO TABS
25.0000 mg | ORAL_TABLET | Freq: Every day | ORAL | Status: DC
  Administered 2015-05-26 – 2015-05-30 (×5): 25 mg via ORAL
  Filled 2015-05-26 (×5): qty 1

## 2015-05-26 MED ORDER — PROPOFOL 1000 MG/100ML IV EMUL
INTRAVENOUS | Status: AC
  Filled 2015-05-26: qty 200

## 2015-05-26 MED ORDER — HYDROMORPHONE HCL 1 MG/ML IJ SOLN
INTRAMUSCULAR | Status: AC
  Administered 2015-05-26: 0.5 mg via INTRAVENOUS
  Filled 2015-05-26: qty 1

## 2015-05-26 MED ORDER — DIPHENHYDRAMINE HCL 12.5 MG/5ML PO ELIX: 25.0000 mg | ORAL_SOLUTION | ORAL | Status: DC | PRN

## 2015-05-26 MED ORDER — ONDANSETRON HCL 4 MG/2ML IJ SOLN
INTRAMUSCULAR | Status: AC
  Filled 2015-05-26: qty 2

## 2015-05-26 MED ORDER — FENTANYL CITRATE (PF) 250 MCG/5ML IJ SOLN
INTRAMUSCULAR | Status: AC
  Filled 2015-05-26: qty 5

## 2015-05-26 MED ORDER — METHOCARBAMOL 500 MG PO TABS
500.0000 mg | ORAL_TABLET | Freq: Four times a day (QID) | ORAL | Status: DC | PRN
  Administered 2015-05-26 – 2015-05-30 (×8): 500 mg via ORAL
  Filled 2015-05-26 (×7): qty 1

## 2015-05-26 MED ORDER — ACETAMINOPHEN 650 MG RE SUPP: 650.0000 mg | Freq: Four times a day (QID) | RECTAL | Status: DC | PRN

## 2015-05-26 MED ORDER — PHENOL 1.4 % MT LIQD: 1.0000 | OROMUCOSAL | Status: DC | PRN

## 2015-05-26 MED ORDER — CEFAZOLIN SODIUM-DEXTROSE 2-3 GM-% IV SOLR
2.0000 g | INTRAVENOUS | Status: AC
  Administered 2015-05-26: 2 g via INTRAVENOUS
  Filled 2015-05-26: qty 50

## 2015-05-26 MED ORDER — PROPOFOL 10 MG/ML IV BOLUS
INTRAVENOUS | Status: AC
  Filled 2015-05-26: qty 20

## 2015-05-26 MED ORDER — METHOCARBAMOL 500 MG PO TABS
ORAL_TABLET | ORAL | Status: AC
Start: 2015-05-26 — End: 2015-05-27
  Filled 2015-05-26: qty 1

## 2015-05-26 MED ORDER — SODIUM CHLORIDE 0.9 % IJ SOLN
INTRAMUSCULAR | Status: DC | PRN
  Administered 2015-05-26: 40 mL

## 2015-05-26 MED ORDER — CHLORHEXIDINE GLUCONATE 4 % EX LIQD: 60.0000 mL | Freq: Once | CUTANEOUS | Status: DC

## 2015-05-26 MED ORDER — ASPIRIN EC 325 MG PO TBEC
325.0000 mg | DELAYED_RELEASE_TABLET | Freq: Two times a day (BID) | ORAL | Status: DC
  Administered 2015-05-26 – 2015-05-30 (×7): 325 mg via ORAL
  Filled 2015-05-26 (×7): qty 1

## 2015-05-26 MED ORDER — POVIDONE-IODINE 10 % EX SOLN
CUTANEOUS | Status: DC | PRN
  Administered 2015-05-26: 1 via TOPICAL

## 2015-05-26 MED ORDER — CEFAZOLIN SODIUM-DEXTROSE 2-3 GM-% IV SOLR
2.0000 g | Freq: Four times a day (QID) | INTRAVENOUS | Status: AC
  Administered 2015-05-26 (×2): 2 g via INTRAVENOUS
  Filled 2015-05-26 (×2): qty 50

## 2015-05-26 MED ORDER — MIDAZOLAM HCL 2 MG/2ML IJ SOLN
INTRAMUSCULAR | Status: AC
  Administered 2015-05-26: 2 mg
  Filled 2015-05-26: qty 2

## 2015-05-26 MED ORDER — HYDROMORPHONE HCL 1 MG/ML IJ SOLN
1.0000 mg | INTRAMUSCULAR | Status: DC | PRN
  Administered 2015-05-26 – 2015-05-28 (×6): 1 mg via INTRAVENOUS
  Filled 2015-05-26 (×6): qty 1

## 2015-05-26 MED ORDER — MORPHINE SULFATE (PF) 2 MG/ML IV SOLN
1.0000 mg | INTRAVENOUS | Status: DC | PRN
  Administered 2015-05-26: 1 mg via INTRAVENOUS
  Filled 2015-05-26: qty 1

## 2015-05-26 MED ORDER — SODIUM CHLORIDE 0.9 % IV SOLN
INTRAVENOUS | Status: DC
  Administered 2015-05-26: 1 mL via INTRAVENOUS
  Administered 2015-05-27: 09:00:00 via INTRAVENOUS

## 2015-05-26 MED ORDER — BUPIVACAINE IN DEXTROSE 0.75-8.25 % IT SOLN
INTRATHECAL | Status: DC | PRN
  Administered 2015-05-26: 2 mL via INTRATHECAL

## 2015-05-26 MED ORDER — ONDANSETRON HCL 4 MG PO TABS: 4.0000 mg | ORAL_TABLET | Freq: Four times a day (QID) | ORAL | Status: DC | PRN

## 2015-05-26 MED ORDER — METOCLOPRAMIDE HCL 5 MG PO TABS: 5.0000 mg | ORAL_TABLET | Freq: Three times a day (TID) | ORAL | Status: DC | PRN

## 2015-05-26 MED ORDER — PROPOFOL INFUSION 10 MG/ML OPTIME
INTRAVENOUS | Status: DC | PRN
  Administered 2015-05-26: 50 ug/kg/min via INTRAVENOUS

## 2015-05-26 MED ORDER — ALUM & MAG HYDROXIDE-SIMETH 200-200-20 MG/5ML PO SUSP: 30.0000 mL | ORAL | Status: DC | PRN

## 2015-05-26 MED ORDER — OXYCODONE HCL 5 MG PO TABS: 5.0000 mg | ORAL_TABLET | ORAL | Status: DC | PRN

## 2015-05-26 MED ORDER — FENTANYL CITRATE (PF) 100 MCG/2ML IJ SOLN
INTRAMUSCULAR | Status: DC | PRN
  Administered 2015-05-26: 100 ug via INTRAVENOUS

## 2015-05-26 MED ORDER — OXYCODONE HCL ER 10 MG PO T12A
10.0000 mg | EXTENDED_RELEASE_TABLET | Freq: Two times a day (BID) | ORAL | Status: DC
  Administered 2015-05-26 – 2015-05-30 (×8): 10 mg via ORAL
  Filled 2015-05-26 (×8): qty 1

## 2015-05-26 MED ORDER — LACTATED RINGERS IV SOLN
INTRAVENOUS | Status: DC
  Administered 2015-05-26: 11:00:00 via INTRAVENOUS

## 2015-05-26 MED ORDER — OXYCODONE HCL ER 10 MG PO T12A: 10.0000 mg | EXTENDED_RELEASE_TABLET | Freq: Two times a day (BID) | ORAL | Status: DC

## 2015-05-26 MED ORDER — SENNOSIDES-DOCUSATE SODIUM 8.6-50 MG PO TABS: 1.0000 | ORAL_TABLET | Freq: Every evening | ORAL | Status: DC | PRN

## 2015-05-26 MED ORDER — FENTANYL CITRATE (PF) 100 MCG/2ML IJ SOLN
INTRAMUSCULAR | Status: AC
  Administered 2015-05-26: 100 ug
  Filled 2015-05-26: qty 2

## 2015-05-26 MED ORDER — OXYCODONE HCL 5 MG PO TABS
5.0000 mg | ORAL_TABLET | ORAL | Status: DC | PRN
  Administered 2015-05-26: 10 mg via ORAL
  Administered 2015-05-26 – 2015-05-30 (×9): 15 mg via ORAL
  Administered 2015-05-30: 10 mg via ORAL
  Filled 2015-05-26 (×3): qty 3
  Filled 2015-05-26: qty 2
  Filled 2015-05-26 (×2): qty 3
  Filled 2015-05-26: qty 2
  Filled 2015-05-26 (×5): qty 3

## 2015-05-26 MED ORDER — ZOLPIDEM TARTRATE 5 MG PO TABS
10.0000 mg | ORAL_TABLET | Freq: Every evening | ORAL | Status: DC | PRN
  Administered 2015-05-26 – 2015-05-29 (×3): 10 mg via ORAL
  Filled 2015-05-26 (×3): qty 2

## 2015-05-26 MED ORDER — ACETAMINOPHEN 500 MG PO TABS
1000.0000 mg | ORAL_TABLET | Freq: Four times a day (QID) | ORAL | Status: AC
  Administered 2015-05-26 – 2015-05-27 (×4): 1000 mg via ORAL
  Filled 2015-05-26 (×4): qty 2

## 2015-05-26 MED ORDER — ASPIRIN EC 325 MG PO TBEC: 325.0000 mg | DELAYED_RELEASE_TABLET | Freq: Two times a day (BID) | ORAL | Status: DC

## 2015-05-26 MED ORDER — ONDANSETRON HCL 4 MG/2ML IJ SOLN
INTRAMUSCULAR | Status: DC | PRN
  Administered 2015-05-26: 4 mg via INTRAVENOUS

## 2015-05-26 MED ORDER — MIDAZOLAM HCL 5 MG/5ML IJ SOLN
INTRAMUSCULAR | Status: DC | PRN
  Administered 2015-05-26: 2 mg via INTRAVENOUS

## 2015-05-26 MED ORDER — ONDANSETRON HCL 4 MG/2ML IJ SOLN
4.0000 mg | Freq: Four times a day (QID) | INTRAMUSCULAR | Status: DC | PRN
  Administered 2015-05-26 – 2015-05-27 (×2): 4 mg via INTRAVENOUS
  Filled 2015-05-26 (×2): qty 2

## 2015-05-26 MED ORDER — MEPERIDINE HCL 25 MG/ML IJ SOLN: 6.2500 mg | INTRAMUSCULAR | Status: DC | PRN

## 2015-05-26 MED ORDER — 0.9 % SODIUM CHLORIDE (POUR BTL) OPTIME
TOPICAL | Status: DC | PRN
  Administered 2015-05-26: 1000 mL

## 2015-05-26 MED ORDER — LISINOPRIL 40 MG PO TABS
40.0000 mg | ORAL_TABLET | Freq: Every day | ORAL | Status: DC
  Administered 2015-05-26 – 2015-05-30 (×5): 40 mg via ORAL
  Filled 2015-05-26 (×5): qty 1

## 2015-05-26 MED ORDER — MENTHOL 3 MG MT LOZG: 1.0000 | LOZENGE | OROMUCOSAL | Status: DC | PRN

## 2015-05-26 MED ORDER — ACETAMINOPHEN 325 MG PO TABS: 650.0000 mg | ORAL_TABLET | Freq: Four times a day (QID) | ORAL | Status: DC | PRN

## 2015-05-26 MED ORDER — CELECOXIB 200 MG PO CAPS
200.0000 mg | ORAL_CAPSULE | Freq: Two times a day (BID) | ORAL | Status: DC
  Administered 2015-05-26 – 2015-05-27 (×2): 200 mg via ORAL
  Filled 2015-05-26 (×2): qty 1

## 2015-05-26 SURGICAL SUPPLY — 67 items
ALCOHOL ISOPROPYL (RUBBING) (MISCELLANEOUS) IMPLANT
BANDAGE ELASTIC 6 VELCRO ST LF (GAUZE/BANDAGES/DRESSINGS) ×6 IMPLANT
BANDAGE ESMARK 6X9 LF (GAUZE/BANDAGES/DRESSINGS) ×1 IMPLANT
BLADE SAG 18X100X1.27 (BLADE) ×6 IMPLANT
BLADE SAGITTAL 25.0X1.27X90 (BLADE) ×2 IMPLANT
BLADE SAGITTAL 25.0X1.27X90MM (BLADE) ×1
BLADE SAW SGTL 13.0X1.19X90.0M (BLADE) ×3 IMPLANT
BNDG ESMARK 6X9 LF (GAUZE/BANDAGES/DRESSINGS) ×3
BONE CEMENT PALACOSE (Orthopedic Implant) ×6 IMPLANT
BOWL SMART MIX CTS (DISPOSABLE) ×3 IMPLANT
CAPT KNEE TOTAL 3 ×3 IMPLANT
CEMENT BONE PALACOSE (Orthopedic Implant) ×2 IMPLANT
COVER SURGICAL LIGHT HANDLE (MISCELLANEOUS) ×3 IMPLANT
CUFF TOURNIQUET SINGLE 34IN LL (TOURNIQUET CUFF) ×3 IMPLANT
CUFF TOURNIQUET SINGLE 44IN (TOURNIQUET CUFF) IMPLANT
DERMABOND ADVANCED (GAUZE/BANDAGES/DRESSINGS) ×2
DERMABOND ADVANCED .7 DNX12 (GAUZE/BANDAGES/DRESSINGS) ×1 IMPLANT
DRAPE EXTREMITY T 121X128X90 (DRAPE) ×3 IMPLANT
DRAPE IMP U-DRAPE 54X76 (DRAPES) ×3 IMPLANT
DRAPE INCISE IOBAN 66X45 STRL (DRAPES) ×3 IMPLANT
DRAPE ORTHO SPLIT 77X108 STRL (DRAPES) ×4
DRAPE PROXIMA HALF (DRAPES) ×3 IMPLANT
DRAPE SURG 17X11 SM STRL (DRAPES) ×3 IMPLANT
DRAPE SURG ORHT 6 SPLT 77X108 (DRAPES) ×2 IMPLANT
DRSG ADAPTIC 3X8 NADH LF (GAUZE/BANDAGES/DRESSINGS) ×3 IMPLANT
DRSG AQUACEL AG ADV 3.5X14 (GAUZE/BANDAGES/DRESSINGS) ×3 IMPLANT
DRSG PAD ABDOMINAL 8X10 ST (GAUZE/BANDAGES/DRESSINGS) ×3 IMPLANT
DURAPREP 26ML APPLICATOR (WOUND CARE) ×9 IMPLANT
ELECT CAUTERY BLADE 6.4 (BLADE) ×3 IMPLANT
ELECT REM PT RETURN 9FT ADLT (ELECTROSURGICAL) ×3
ELECTRODE REM PT RTRN 9FT ADLT (ELECTROSURGICAL) ×1 IMPLANT
EVACUATOR 1/8 PVC DRAIN (DRAIN) IMPLANT
FACESHIELD WRAPAROUND (MASK) ×3 IMPLANT
GAUZE SPONGE 4X4 12PLY STRL (GAUZE/BANDAGES/DRESSINGS) ×3 IMPLANT
GAUZE XEROFORM 5X9 LF (GAUZE/BANDAGES/DRESSINGS) ×3 IMPLANT
GLOVE SURG SYN 7.5  E (GLOVE) ×4
GLOVE SURG SYN 7.5 E (GLOVE) ×2 IMPLANT
GOWN STRL REIN XL XLG (GOWN DISPOSABLE) ×9 IMPLANT
HANDPIECE INTERPULSE COAX TIP (DISPOSABLE) ×2
HOOD SURGICAL BLUE (PROTECTIVE WEAR) ×3 IMPLANT
KIT BASIN OR (CUSTOM PROCEDURE TRAY) ×3 IMPLANT
KIT ROOM TURNOVER OR (KITS) ×3 IMPLANT
MANIFOLD NEPTUNE II (INSTRUMENTS) ×3 IMPLANT
NEEDLE SPNL 18GX3.5 QUINCKE PK (NEEDLE) ×6 IMPLANT
NS IRRIG 1000ML POUR BTL (IV SOLUTION) ×3 IMPLANT
PACK TOTAL JOINT (CUSTOM PROCEDURE TRAY) ×3 IMPLANT
PACK UNIVERSAL I (CUSTOM PROCEDURE TRAY) ×3 IMPLANT
PAD ARMBOARD 7.5X6 YLW CONV (MISCELLANEOUS) ×6 IMPLANT
PADDING CAST COTTON 6X4 STRL (CAST SUPPLIES) ×3 IMPLANT
PEN SKIN MARKING BROAD (MISCELLANEOUS) ×3 IMPLANT
SET HNDPC FAN SPRY TIP SCT (DISPOSABLE) ×1 IMPLANT
SPONGE GAUZE 4X4 12PLY STER LF (GAUZE/BANDAGES/DRESSINGS) ×3 IMPLANT
STAPLER VISISTAT 35W (STAPLE) IMPLANT
SUCTION FRAZIER TIP 10 FR DISP (SUCTIONS) ×3 IMPLANT
SUT ETHILON 2 0 FS 18 (SUTURE) ×6 IMPLANT
SUT VIC AB 0 CT1 27 (SUTURE) ×4
SUT VIC AB 0 CT1 27XBRD ANBCTR (SUTURE) ×2 IMPLANT
SUT VIC AB 1 CT1 27 (SUTURE) ×4
SUT VIC AB 1 CT1 27XBRD ANBCTR (SUTURE) ×2 IMPLANT
SUT VIC AB 2-0 CT1 27 (SUTURE) ×6
SUT VIC AB 2-0 CT1 TAPERPNT 27 (SUTURE) ×3 IMPLANT
SYR 50ML LL SCALE MARK (SYRINGE) ×3 IMPLANT
TOWEL OR 17X24 6PK STRL BLUE (TOWEL DISPOSABLE) ×3 IMPLANT
TOWEL OR 17X26 10 PK STRL BLUE (TOWEL DISPOSABLE) ×3 IMPLANT
WATER STERILE IRR 1000ML POUR (IV SOLUTION) ×6 IMPLANT
WRAP KNEE MAXI GEL POST OP (GAUZE/BANDAGES/DRESSINGS) ×3 IMPLANT
YANKAUER SUCT BULB TIP NO VENT (SUCTIONS) ×3 IMPLANT

## 2015-05-26 NOTE — Discharge Instructions (Signed)

## 2015-05-26 NOTE — Transfer of Care (Signed)
Immediate Anesthesia Transfer of Care Note  Patient: MINOR IDEN  Procedure(s) Performed: Procedure(s): RIGHT TOTAL KNEE ARTHROPLASTY (Right)  Patient Location: PACU  Anesthesia Type:Spinal  Level of Consciousness: awake  Airway & Oxygen Therapy: Patient Spontanous Breathing and Patient connected to nasal cannula oxygen  Post-op Assessment: Report given to RN and Post -op Vital signs reviewed and stable  Post vital signs: Reviewed and stable  Last Vitals:  Filed Vitals:   05/26/15 1215  BP:   Pulse: 57  Temp:   Resp: 11    Complications: No apparent anesthesia complications

## 2015-05-26 NOTE — Progress Notes (Signed)
Orthopedic Tech Progress Note Patient Details:  Walter Hodges 1956/06/11 [id number]  CPM Right Knee CPM Right Knee: On Right Knee Flexion (Degrees): 90 Right Knee Extension (Degrees): 0   Cammer, Mickie Bail 05/26/2015, 5:52 PM

## 2015-05-26 NOTE — Anesthesia Preprocedure Evaluation (Addendum)
Anesthesia Evaluation  Patient identified by MRN, date of birth, ID band Patient awake    Reviewed: Allergy & Precautions, NPO status , Patient's Chart, lab work & pertinent test results  Airway Mallampati: II  TM Distance: >3 FB Neck ROM: Full    Dental no notable dental hx.    Pulmonary neg pulmonary ROS,    Pulmonary exam normal breath sounds clear to auscultation       Cardiovascular hypertension, Pt. on medications Normal cardiovascular exam Rhythm:Regular Rate:Normal     Neuro/Psych negative neurological ROS  negative psych ROS   GI/Hepatic negative GI ROS, Neg liver ROS,   Endo/Other  negative endocrine ROS  Renal/GU negative Renal ROS     Musculoskeletal  (+) Arthritis ,   Abdominal (+) + obese,   Peds  Hematology negative hematology ROS (+)   Anesthesia Other Findings   Reproductive/Obstetrics negative OB ROS                            Anesthesia Physical Anesthesia Plan  ASA: II  Anesthesia Plan: Spinal, MAC and Regional   Post-op Pain Management: MAC Combined w/ Regional for Post-op pain   Induction: Intravenous  Airway Management Planned: Simple Face Mask  Additional Equipment:   Intra-op Plan:   Post-operative Plan:   Informed Consent: I have reviewed the patients History and Physical, chart, labs and discussed the procedure including the risks, benefits and alternatives for the proposed anesthesia with the patient or authorized representative who has indicated his/her understanding and acceptance.   Dental advisory given  Plan Discussed with: CRNA  Anesthesia Plan Comments:        Anesthesia Quick Evaluation

## 2015-05-26 NOTE — Anesthesia Postprocedure Evaluation (Signed)
Anesthesia Post Note  Patient: Walter Hodges  Procedure(s) Performed: Procedure(s) (LRB): RIGHT TOTAL KNEE ARTHROPLASTY (Right)  Anesthesia type: Spinal  Patient location: PACU  Post pain: Pain level controlled  Post assessment: Post-op Vital signs reviewed  Last Vitals: BP 130/75 mmHg  Pulse 55  Temp(Src) 36.3 C (Axillary)  Resp 12  Ht  (1.778 m)  Wt 260 lb (117.935 kg)  BMI 37.31 kg/m2  SpO2 100%  Post vital signs: Reviewed  Level of consciousness: sedated  Complications: No apparent anesthesia complications

## 2015-05-26 NOTE — Op Note (Signed)
Total Knee Arthroplasty Procedure Note PAULMICHAEL SCHRECK [id number] 05/26/2015   Preoperative diagnosis: Right knee osteoarthritis  Postoperative diagnosis:same  Operative procedure: Right total knee arthroplasty. CPT [phone]  Surgeon: N. Glee Arvin, MD  Co-surgeon: none  Assistants: April Green, RNFA  Anesthesia: Spinal, regional  Tourniquet time: less than 2 hrs  Implants used: Smith and Nephew Femur: Legion PS 7 Tibia:Genesis II 7 Patella: 35 mm Polyethylene: 9 mm  Indication: Walter Hodges is a 59 y.o. year old male with a history of knee pain. Having failed conservative management, the patient elected to proceed with a total knee arthroplasty.  We have reviewed the risk and benefits of the surgery and they elected to proceed after voicing understanding.  Procedure:  After informed consent was obtained and understanding of the risk were voiced including but not limited to bleeding, infection, damage to surrounding structures including nerves and vessels, blood clots, leg length inequality and the failure to achieve desired results, the operative extremity was marked with verbal confirmation of the patient in the holding area.   The patient was then brought to the operating room and transported to the operating room table in the supine position.  A tourniquet was applied to the operative extremity around the upper thigh. The operative limb was then prepped and draped in the usual sterile fashion and preoperative antibiotics were administered.  A time out was performed prior to the start of surgery confirming the correct extremity, preoperative antibiotic administration, as well as team members, implants and instruments available for the case. Correct surgical site was also confirmed with preoperative radiographs. The limb was then elevated for exsanguination and the tourniquet was inflated. A midline incision was made and a standard medial parapatellar approach was performed.   The patella was prepared and sized to a 35 mm.  A cover was placed on the patella for protection from retractors.  We then turned our attention to the femur. Posterior cruciate ligament was sacrificed. Start site was drilled in the femur and the intramedullary distal femoral cutting guide was placed, set at 5 degrees valgus, taking 9 mm of distal resection. The distal cut was made. Osteophytes were then removed. Next, the proximal tibial cutting guide was placed with appropriate slope, varus/valgus alignment and depth of resection. The proximal tibial cut was made. Gap blocks were then used to assess the extension gap and alignment, and appropriate soft tissue releases were performed. Attention was turned back to the femur, which was sized using the sizing guide to a size 7. Appropriate rotation of the femoral component was determined using epicondylar axis, Whiteside's line, and assessing the flexion gap under ligament tension. The appropriate size 4-in-1 cutting block was placed and cuts were made. Posterior femoral osteophytes and uncapped bone were then removed with the curved osteotome. The tibia was sized for a size 7 component. The femoral box-cutting guide was placed and prepared for a PS femoral component. Trial components were placed, and stability was checked in full extension, mid-flexion, and deep flexion. Proper tibial rotation was determined and marked.  The patella tracked well without a lateral release. Trial components were then removed and tibial preparation performed. A posterior capsular injection comprising of 20 cc of 1.3% exparel and 40 cc of normal saline was performed for postoperative pain control. The bony surfaces were irrigated with a pulse lavage and then dried. Bone cement was vacuum mixed on the back table, and the final components sized above were cemented into place. After cement had finished  curing, excess cement was removed. The stability of the construct was re-evaluated  throughout a range of motion and found to be acceptable. The trial liner was removed, the knee was copiously irrigated, and the knee was re-evaluated for any excess bone debris. The real polyethylene liner, 9 mm thick, was inserted and checked to ensure the locking mechanism had engaged appropriately. The tourniquet was deflated and hemostasis was achieved. The wound was irrigated with dilute betadine in normal saline, and then again with normal saline. A drain was placed.  Capsular closure was performed with a #1 vicryl, subcutaneous fat closed with a 2.0 vicryl suture, then subcutaneous tissue closed with interrupted 2.0 vicryl suture. The skin was then closed with a staples. A sterile dressing was applied.   The patient was awakened in the operating room and taken to recovery in stable condition. All sponge, needle, and instrument counts were correct at the end of the case.  Position: supine  Complications: none.  Time Out: performed   Drains/Packing: 1 HVAC  Estimated blood loss: minimal  Returned to Recovery Room: in good condition.   Antibiotics: yes   Mechanical VTE (DVT) Prophylaxis: sequential compression devices, TED thigh-high  Chemical VTE (DVT) Prophylaxis: aspirin  Fluid Replacement  Crystalloid: see anesthesia record Blood: none  FFP: none   Specimens Removed: 1 to pathology   Sponge and Instrument Count Correct? yes   PACU: portable radiograph - knee AP and Lateral   Admission: inpatient status, start PT & OT POD#1  Plan/RTC: Return in 2 weeks for wound check.   Weight Bearing/Load Lower Extremity: full   N. Glee Arvin, MD [facility] [phone] 2:52 PM

## 2015-05-26 NOTE — H&P (Signed)
    PREOPERATIVE H&P  Chief Complaint: right knee degenerative joint disease  HPI: Walter Hodges is a 59 y.o. male who presents for surgical treatment of right knee degenerative joint disease.  He denies any changes in medical history.  Past Medical History  Diagnosis Date  . Bell's palsy 2010    lasted one day ago, told that he had a ministroke  . Hypertension     followed by Dr. Nita Sells" with [facility]    . Arthritis    Past Surgical History  Procedure Laterality Date  . Circumcision      done as a child- given inhalation    Social History   Social History  . Marital Status: Widowed    Spouse Name: N/A  . Number of Children: N/A  . Years of Education: N/A   Social History Main Topics  . Smoking status: Never Smoker   . Smokeless tobacco: Not on file  . Alcohol Use: No  . Drug Use: No  . Sexual Activity: Not on file   Other Topics Concern  . Not on file   Social History Narrative   No family history on file. No Known Allergies Prior to Admission medications   Medication Sig Start Date End Date Taking? Authorizing Provider  aspirin EC 325 MG tablet Take 325 mg by mouth every 6 (six) hours as needed for moderate pain.    Yes Historical Provider, MD  ibuprofen (ADVIL,MOTRIN) 800 MG tablet Take 800 mg by mouth every 8 (eight) hours as needed.   Yes Historical Provider, MD  lisinopril-hydrochlorothiazide (PRINZIDE,ZESTORETIC) 20-12.5 MG per tablet Take 2 tablets by mouth daily.   Yes Historical Provider, MD     Positive ROS: All other systems have been reviewed and were otherwise negative with the exception of those mentioned in the HPI and as above.  Physical Exam: General: Alert, no acute distress Cardiovascular: No pedal edema Respiratory: No cyanosis, no use of accessory musculature GI: abdomen soft Skin: No lesions in the area of chief complaint Neurologic: Sensation intact distally Psychiatric: Patient is competent for consent with normal mood and  affect Lymphatic: no lymphedema  MUSCULOSKELETAL: exam stable  Assessment: right knee degenerative joint disease  Plan: Plan for Procedure(s): RIGHT TOTAL KNEE ARTHROPLASTY  The risks benefits and alternatives were discussed with the patient including but not limited to the risks of nonoperative treatment, versus surgical intervention including infection, bleeding, nerve injury,  blood clots, cardiopulmonary complications, morbidity, mortality, among others, and they were willing to proceed.   Cheral Almas, MD   05/26/2015 6:34 AM

## 2015-05-26 NOTE — Anesthesia Procedure Notes (Addendum)
Anesthesia Regional Block:  Adductor canal block  Pre-Anesthetic Checklist: ,, timeout performed, Correct Patient, Correct Site, Correct Laterality, Correct Procedure, Correct Position, site marked, Risks and benefits discussed, Surgical consent,  Pre-op evaluation,  Post-op pain management  Laterality: Right  Prep: chloraprep       Needles:  Injection technique: Single-shot  Needle Type: Stimiplex     Needle Length: 9cm 9 cm Needle Gauge: 21 and 21 G    Additional Needles:  Procedures: ultrasound guided (picture in chart) Adductor canal block Narrative:  Injection made incrementally with aspirations every 5 mL.  Performed by: Personally  Anesthesiologist: Nolon Nations  Additional Notes: BP cuff, EKG monitors applied. Sedation begun. Artery and nerve location verified with U/S and anesthetic injected incrementally, slowly, and after negative aspirations under direct u/s guidance. Good fascial /perineural spread. Tolerated well.   Spinal Patient location during procedure: OR Staffing Anesthesiologist: Nolon Nations Performed by: anesthesiologist  Preanesthetic Checklist Completed: patient identified, site marked, surgical consent, pre-op evaluation, timeout performed, IV checked, risks and benefits discussed and monitors and equipment checked Spinal Block Patient position: sitting Prep: ChloraPrep Patient monitoring: heart rate, continuous pulse ox and blood pressure Approach: right paramedian Location: L3-4 Injection technique: single-shot Needle Needle type: Sprotte  Needle gauge: 24 G Needle length: 9 cm Additional Notes Expiration date of kit checked and confirmed. Patient tolerated procedure well, without complications.

## 2015-05-27 ENCOUNTER — Encounter (HOSPITAL_COMMUNITY): Payer: Self-pay | Admitting: Orthopaedic Surgery

## 2015-05-27 LAB — CBC
HCT: 33.9 % — ABNORMAL LOW (ref 39.0–52.0)
Hemoglobin: 11.5 g/dL — ABNORMAL LOW (ref 13.0–17.0)
MCH: 28.8 pg (ref 26.0–34.0)
MCHC: 33.9 g/dL (ref 30.0–36.0)
MCV: 85 fL (ref 78.0–100.0)
PLATELETS: 261 10*3/uL (ref 150–400)
RBC: 3.99 MIL/uL — ABNORMAL LOW (ref 4.22–5.81)
RDW: 13.7 % (ref 11.5–15.5)
WBC: 9.5 10*3/uL (ref 4.0–10.5)

## 2015-05-27 LAB — BASIC METABOLIC PANEL
Anion gap: 7 (ref 5–15)
BUN: 10 mg/dL (ref 6–20)
CALCIUM: 8.1 mg/dL — AB (ref 8.9–10.3)
CO2: 29 mmol/L (ref 22–32)
CREATININE: 0.84 mg/dL (ref 0.61–1.24)
Chloride: 98 mmol/L — ABNORMAL LOW (ref 101–111)
GFR calc Af Amer: >60 mL/min (ref >60)
GLUCOSE: 120 mg/dL — AB (ref 65–99)
Potassium: 3.2 mmol/L — ABNORMAL LOW (ref 3.5–5.1)
SODIUM: 134 mmol/L — AB (ref 135–145)

## 2015-05-27 MED ORDER — KETOROLAC TROMETHAMINE 30 MG/ML IJ SOLN
30.0000 mg | Freq: Four times a day (QID) | INTRAMUSCULAR | Status: AC | PRN
  Administered 2015-05-27: 30 mg via INTRAVENOUS
  Filled 2015-05-27: qty 1

## 2015-05-27 NOTE — Progress Notes (Signed)
   Subjective:  Patient reports pain as severe.  No events.  Objective:   VITALS:   Filed Vitals:   05/26/15 2034 05/26/15 2154 05/27/15 0420 05/27/15 0700  BP: 184/91  140/80 114/63  Pulse: 88 89 75 80  Temp: 98.1 F (36.7 C) 97.6 F (36.4 C) 97.9 F (36.6 C) 98.1 F (36.7 C)  TempSrc: Oral   Oral  Resp: Height:      Weight:      SpO2: 98% 96% 98% 97%    Neurologically intact ABD soft Neurovascular intact Sensation intact distally Intact pulses distally Dorsiflexion/Plantar flexion intact Incision: dressing C/D/I and no drainage No cellulitis present Compartment soft   Lab Results  Component Value Date   WBC 9.5 05/27/2015   HGB 11.5* 05/27/2015   HCT 33.9* 05/27/2015   MCV 85.0 05/27/2015   PLT 261 05/27/2015     Assessment/Plan:  1 Day Post-Op   - Expected postop acute blood loss anemia - will monitor for symptoms - Up with PT/OT - DVT ppx - SCDs, ambulation, aspirin - WBAT operative extremity - Pain control - HVAC removed - Discharge planning  Yeng, Perz 05/27/2015, 9:13 AM [phone]

## 2015-05-27 NOTE — Evaluation (Signed)
Physical Therapy Evaluation Patient Details Name: Walter Hodges MRN: [id number] DOB: April 22, 1956 Today's Date: 05/27/2015   History of Present Illness  Rt TKA  Clinical Impression  Pt is s/p TKA resulting in the deficits listed below (see PT Problem List). Pt will benefit from skilled PT to increase their independence and safety with mobility to allow discharge to home. Patient reporting that he is not sure if he will be able to go directly home upon D/C or go to a facility. Based upon the patient's initial mobility, it is anticipated that he will be able to D/C home if supervision is able to be provided when OOB. Will continue to assess during subsequent sessions.        Follow Up Recommendations Home health PT;Supervision for mobility/OOB    Equipment Recommendations  Rolling walker with 5" wheels    Recommendations for Other Services       Precautions / Restrictions Precautions Precautions: Knee;Fall Precaution Booklet Issued: Yes (comment) Precaution Comments: HEP provided Restrictions Weight Bearing Restrictions: Yes RLE Weight Bearing: Weight bearing as tolerated      Mobility  Bed Mobility Overal bed mobility: Needs Assistance Bed Mobility: Supine to Sit     Supine to sit: Supervision;HOB elevated        Transfers Overall transfer level: Needs assistance Equipment used: Rolling walker (2 wheeled) Transfers: Sit to/from Stand Sit to Stand: Min assist         General transfer comment: cues for hand placement, using bed rail to assist  Ambulation/Gait Ambulation/Gait assistance: Min guard Ambulation Distance (Feet): 12 Feet Assistive device: Rolling walker (2 wheeled) Gait Pattern/deviations: Step-to pattern;Decreased weight shift to right;Decreased stance time - right Gait velocity: decreased   General Gait Details: encouraging weight bearing as tolerated.   Stairs            Wheelchair Mobility    Modified Rankin (Stroke Patients Only)        Balance Overall balance assessment: Needs assistance Sitting-balance support: No upper extremity supported Sitting balance-Leahy Scale: Fair     Standing balance support: Bilateral upper extremity supported Standing balance-Leahy Scale: Poor Standing balance comment: using rw                             Pertinent Vitals/Pain Pain Assessment: 0-10 Pain Score: 8  Pain Location: Rt knee Pain Descriptors / Indicators: Aching;Sharp Pain Intervention(s): Limited activity within patient's tolerance;Monitored during session    Home Living Family/patient expects to be discharged to:: Unsure Living Arrangements: Other relatives               Additional Comments: stays with aunt    Prior Function Level of Independence: Independent               Hand Dominance        Extremity/Trunk Assessment               Lower Extremity Assessment: RLE deficits/detail RLE Deficits / Details: fair quad activation, able to perform SLR with lag       Communication   Communication: No difficulties  Cognition Arousal/Alertness: Awake/alert Behavior During Therapy: WFL for tasks assessed/performed Overall Cognitive Status: Within Functional Limits for tasks assessed                      General Comments      Exercises Total Joint Exercises Ankle Circles/Pumps: AROM;Both;10 reps Quad Sets: Right;10 reps;Strengthening Gluteal Sets: Strengthening;Both;10  reps      Assessment/Plan    PT Assessment Patient needs continued PT services  PT Diagnosis Difficulty walking;Acute pain   PT Problem List Decreased strength;Decreased activity tolerance;Decreased range of motion;Decreased balance;Decreased mobility;Pain  PT Treatment Interventions DME instruction;Gait training;Functional mobility training;Stair training;Therapeutic activities;Therapeutic exercise;Balance training;Patient/family education   PT Goals (Current goals can be found in the Care  Plan section) Acute Rehab PT Goals Patient Stated Goal: get back to being active again PT Goal Formulation: With patient Time For Goal Achievement: 06/10/15 Potential to Achieve Goals: Good    Frequency 7X/week   Barriers to discharge        Co-evaluation               End of Session Equipment Utilized During Treatment: Gait belt Activity Tolerance: Patient tolerated treatment well Patient left: in chair;with call bell/phone within reach;Other (comment) (in knee extension) Nurse Communication: Mobility status         Time: 1610-9604 PT Time Calculation (min) (ACUTE ONLY): 29 min   Charges:   PT Evaluation $Initial PT Evaluation Tier I: 1 Procedure PT Treatments $Gait Training: 8-22 mins   PT G Codes:        Christiane Ha, PT, CSCS Pager [phone] Office [phone]  05/27/2015, 11:55 AM

## 2015-05-27 NOTE — Progress Notes (Signed)
Utilization review completed.  

## 2015-05-27 NOTE — Progress Notes (Signed)
Physical Therapy Treatment Patient Details Name: Walter Hodges MRN: [id number] DOB: 06/30/1956 Today's Date: 05/27/2015    History of Present Illness Rt TKA    PT Comments    Patient is making good progress with PT.  From a mobility standpoint anticipate patient will be able to return home with family assistance. Patient expressing high level of pain but able to progress with mobility. Patient expressing that he is not sure that he will be able to go directly home and has mentioned going to a rehab facility. In afternoon, patient able to ambulate 120 feet, 92 degrees flexion.      Follow Up Recommendations  Home health PT;Supervision for mobility/OOB     Equipment Recommendations  Rolling walker with 5" wheels    Recommendations for Other Services       Precautions / Restrictions Precautions Precautions: Knee;Fall Precaution Booklet Issued: Yes (comment) Precaution Comments: Reviewed no pillow under knee. Restrictions Weight Bearing Restrictions: Yes RLE Weight Bearing: Weight bearing as tolerated    Mobility  Bed Mobility Overal bed mobility: Needs Assistance Bed Mobility: Supine to Sit     Supine to sit: Supervision;HOB elevated    General bed mobility comments: HOB at approximately 20 degrees  Transfers Overall transfer level: Needs assistance Equipment used: Rolling walker (2 wheeled) Transfers: Sit to/from Stand Sit to Stand: Min guard         General transfer comment: no cues needed.   Ambulation/Gait Ambulation/Gait assistance: Min guard Ambulation Distance (Feet): 120 Feet Assistive device: Rolling walker (2 wheeled) Gait Pattern/deviations: Step-through pattern;Decreased step length - right;Decreased stance time - right;Decreased weight shift to right Gait velocity: decreased   General Gait Details: slow pattern but stable, patient reports feeling tired by end of ambulation   Stairs            Wheelchair Mobility    Modified Rankin  (Stroke Patients Only)       Balance Overall balance assessment: Needs assistance Sitting-balance support: No upper extremity supported Sitting balance-Leahy Scale: Fair     Standing balance support: Bilateral upper extremity supported Standing balance-Leahy Scale: Poor Standing balance comment: using rw in standing                    Cognition Arousal/Alertness: Awake/alert Behavior During Therapy: WFL for tasks assessed/performed Overall Cognitive Status: Within Functional Limits for tasks assessed                      Exercises Total Joint Exercises Ankle Circles/Pumps: AROM;Both;10 reps Quad Sets: Right;10 reps;Strengthening Gluteal Sets: Strengthening;Both;10 reps Heel Slides: AAROM;Right;10 reps Straight Leg Raises: Strengthening;Right;10 reps;Other (comment) (with lag) Goniometric ROM: 92 degrees flexion    General Comments      Pertinent Vitals/Pain Pain Assessment: Faces Pain Score: 9  Faces Pain Scale: Hurts whole lot Pain Location: Rt knee Pain Descriptors / Indicators: Aching;Sore Pain Intervention(s): Monitored during session;Limited activity within patient's tolerance    Home Living    Prior Function        PT Goals (current goals can now be found in the care plan section) Acute Rehab PT Goals Patient Stated Goal: do what I can PT Goal Formulation: With patient Time For Goal Achievement: 06/10/15 Potential to Achieve Goals: Good Progress towards PT goals: Progressing toward goals    Frequency  7X/week    PT Plan Current plan remains appropriate    Co-evaluation             End of  Session Equipment Utilized During Treatment: Gait belt Activity Tolerance: Patient limited by fatigue;Patient limited by pain;Other (comment) (reports nausea but no emesis) Patient left: in chair;with call bell/phone within reach;Other (comment) (placed in extension stretch)     Time: 0981-1914 PT Time Calculation (min) (ACUTE ONLY): 27  min  Charges:  $Gait Training: 8-22 mins $Therapeutic Exercise: 8-22 mins                    G Codes:      Delton See 2015-06-23, 3:15 PM

## 2015-05-27 NOTE — Evaluation (Signed)
Occupational Therapy Evaluation Patient Details Name: Walter Hodges MRN: [id number] DOB: 18-Apr-1956 Today's Date: 05/27/2015    History of Present Illness Rt TKA   Clinical Impression   Pt reports he was independent in ADLs PTA. Currently pt is min guard overall for ADL activities. Pt reports significant pain in R knee (9/10), even following pain medication. Pt unsure about going directly home upon d/c, asked if he could go to rehab facility prior to d/c. Based on pts initial eval of ADLs and functional mobility, it is anticipated that pt will be able to safely d/c home with intermittent supervision. Pt reports that he lives with his aunt and she is able to assist upon d/c. Pt would benefit from continued OT to address toilet transfers, tub transfer, and LB ADLs.     Follow Up Recommendations  Home health OT;Supervision - Intermittent    Equipment Recommendations  3 in 1 bedside comode    Recommendations for Other Services       Precautions / Restrictions Precautions Precautions: Knee;Fall Precaution Booklet Issued: Yes (comment) Precaution Comments: Reviewed no pillow under knee. Restrictions Weight Bearing Restrictions: Yes RLE Weight Bearing: Weight bearing as tolerated      Mobility Bed Mobility Overal bed mobility: Needs Assistance Bed Mobility: Supine to Sit;Sit to Supine     Supine to sit: Supervision;HOB elevated Sit to supine: Supervision;HOB elevated   General bed mobility comments: Impulsive with bed mobility  Transfers Overall transfer level: Needs assistance Equipment used: Rolling walker (2 wheeled) Transfers: Sit to/from Stand Sit to Stand: Min guard         General transfer comment: Verbal cues for hand placement, pt very impulsive and moves quickly    Balance Overall balance assessment: Needs assistance Sitting-balance support: No upper extremity supported;Feet supported Sitting balance-Leahy Scale: Fair     Standing balance support:  Bilateral upper extremity supported Standing balance-Leahy Scale: Poor Standing balance comment: RW for support                             ADL Overall ADL's : Needs assistance/impaired Eating/Feeding: Set up;Sitting   Grooming: Set up;Sitting   Upper Body Bathing: Supervision/ safety;Sitting   Lower Body Bathing: Min guard;Sit to/from stand   Upper Body Dressing : Supervision/safety;Sitting   Lower Body Dressing: Min guard;Sit to/from stand   Toilet Transfer: Min guard;Ambulation;BSC;RW (BSC over toilet)   Toileting- Clothing Manipulation and Hygiene: Min guard;Sit to/from stand   Tub/ Shower Transfer: Min guard;Ambulation;3 in 1;Rolling walker   Functional mobility during ADLs: Min guard;Rolling walker       Vision     Perception     Praxis      Pertinent Vitals/Pain Pain Assessment: 0-10 Pain Score: 9  Pain Location: R knee Pain Descriptors / Indicators: Aching;Grimacing;Sore;Throbbing;Constant Pain Intervention(s): Limited activity within patient's tolerance;Monitored during session;Repositioned;Ice applied     Hand Dominance     Extremity/Trunk Assessment Upper Extremity Assessment Upper Extremity Assessment: Overall WFL for tasks assessed   Lower Extremity Assessment Lower Extremity Assessment: Defer to PT evaluation RLE Deficits / Details: fair quad activation, able to perform SLR with lag   Cervical / Trunk Assessment Cervical / Trunk Assessment: Normal   Communication Communication Communication: No difficulties   Cognition Arousal/Alertness: Awake/alert Behavior During Therapy: Impulsive Overall Cognitive Status: Within Functional Limits for tasks assessed                     General  Comments       Exercises Exercises: Total Joint     Shoulder Instructions      Home Living Family/patient expects to be discharged to:: Unsure Living Arrangements: Other relatives (lives with Aunt) Available Help at Discharge:  Family;Available PRN/intermittently Type of Home: House Home Access: Stairs to enter [employer] of Steps: 5   Home Layout: One level     Bathroom Shower/Tub: Chief Strategy Officer: Standard Bathroom Accessibility: Yes How Accessible: Accessible via walker Home Equipment: None   Additional Comments: stays with aunt      Prior Functioning/Environment Level of Independence: Independent             OT Diagnosis: Generalized weakness;Acute pain   OT Problem List: Decreased activity tolerance;Impaired balance (sitting and/or standing);Decreased safety awareness;Decreased knowledge of use of DME or AE;Decreased knowledge of precautions;Pain   OT Treatment/Interventions: Self-care/ADL training;DME and/or AE instruction;Patient/family education    OT Goals(Current goals can be found in the care plan section) Acute Rehab OT Goals Patient Stated Goal: get rid of pain OT Goal Formulation: With patient Time For Goal Achievement: 06/10/15 Potential to Achieve Goals: Good ADL Goals Pt Will Perform Grooming: with modified independence;standing Pt Will Perform Lower Body Bathing: with supervision;sit to/from stand Pt Will Perform Lower Body Dressing: with supervision;sit to/from stand Pt Will Transfer to Toilet: with modified independence;ambulating;bedside commode (BSC over toilet) Pt Will Perform Tub/Shower Transfer: Tub transfer;with modified independence;ambulating;3 in 1;rolling walker  OT Frequency: Min 2X/week   Barriers to D/C:            Co-evaluation              End of Session Equipment Utilized During Treatment: Gait belt;Rolling walker CPM Right Knee CPM Right Knee: Off  Activity Tolerance: Patient limited by pain Patient left: in bed;with call bell/phone within reach   Time: 1317-1336 OT Time Calculation (min): 19 min Charges:  OT General Charges $OT Visit: 1 Procedure OT Evaluation $Initial OT Evaluation Tier I: 1  Procedure G-Codes:     Gaye Alken M.S., OTR/L Pager: [phone] 05/27/2015, 1:58 PM

## 2015-05-28 LAB — CBC
HCT: 31.5 % — ABNORMAL LOW (ref 39.0–52.0)
Hemoglobin: 10.3 g/dL — ABNORMAL LOW (ref 13.0–17.0)
MCH: 28 pg (ref 26.0–34.0)
MCHC: 32.7 g/dL (ref 30.0–36.0)
MCV: 85.6 fL (ref 78.0–100.0)
PLATELETS: 240 10*3/uL (ref 150–400)
RBC: 3.68 MIL/uL — ABNORMAL LOW (ref 4.22–5.81)
RDW: 13.7 % (ref 11.5–15.5)
WBC: 10.3 10*3/uL (ref 4.0–10.5)

## 2015-05-28 MED ORDER — POLYETHYLENE GLYCOL 3350 17 G PO PACK
17.0000 g | PACK | Freq: Every day | ORAL | Status: DC
  Administered 2015-05-28 – 2015-05-30 (×3): 17 g via ORAL
  Filled 2015-05-28 (×3): qty 1

## 2015-05-28 MED ORDER — DOCUSATE SODIUM 100 MG PO CAPS
100.0000 mg | ORAL_CAPSULE | Freq: Two times a day (BID) | ORAL | Status: DC
  Administered 2015-05-28 – 2015-05-30 (×4): 100 mg via ORAL
  Filled 2015-05-28 (×4): qty 1

## 2015-05-28 NOTE — Progress Notes (Signed)
Physical Therapy Treatment Patient Details Name: Walter Hodges MRN: [id number] DOB: 09/24/1955 Today's Date: 05/28/2015    History of Present Illness Rt TKA    PT Comments    Patient is making good progress with PT.  From a mobility standpoint anticipate patient will be ready for DC home with family assistance. Able to increase ambulation distance today and completed stairs this afternoon. Recommending supervision when OOB at this time.  Will continue with skilled PT for progression of independence and safety with mobility.    Follow Up Recommendations  Home health PT;Supervision for mobility/OOB     Equipment Recommendations  Rolling walker with 5" wheels    Recommendations for Other Services       Precautions / Restrictions Precautions Precautions: Knee;Fall Precaution Booklet Issued: Yes (comment) Precaution Comments: reviewed HEP Restrictions Weight Bearing Restrictions: Yes RLE Weight Bearing: Weight bearing as tolerated    Mobility  Bed Mobility Overal bed mobility: Modified Independent Bed Mobility: Supine to Sit     Supine to sit: Modified independent (Device/Increase time)     General bed mobility comments: HOB flat, no hand rail  Transfers Overall transfer level: Needs assistance Equipment used: Rolling walker (2 wheeled) Transfers: Sit to/from Stand Sit to Stand: Min guard         General transfer comment: no cues needed.   Ambulation/Gait Ambulation/Gait assistance: Min guard Ambulation Distance (Feet): 50 Feet Assistive device: Rolling walker (2 wheeled) Gait Pattern/deviations: Step-through pattern;Decreased weight shift to right Gait velocity: decreased   General Gait Details: encouraging knee flexion with swing phase   Stairs Stairs: Yes Stairs assistance: Min assist Stair Management: Two rails;One rail Right (X5 steps with one rail, X10 steps with both rails) Number of Stairs: 15 General stair comments: reminder cues for which  leg to lead with going up and down.   Wheelchair Mobility    Modified Rankin (Stroke Patients Only)       Balance Overall balance assessment: Needs assistance Sitting-balance support: No upper extremity supported Sitting balance-Leahy Scale: Good     Standing balance support: During functional activity Standing balance-Leahy Scale: Fair Standing balance comment: using rw                    Cognition Arousal/Alertness: Awake/alert;Lethargic (occasionally needing verbal reminder to stay awake. ) Behavior During Therapy: WFL for tasks assessed/performed Overall Cognitive Status: Within Functional Limits for tasks assessed                      Exercises Total Joint Exercises Ankle Circles/Pumps: AROM;Both;10 reps Quad Sets: Right;10 reps;Strengthening Short Arc Quad: Strengthening;Right;10 reps;Other (comment) (min assist) Heel Slides: AAROM;Right;10 reps Hip ABduction/ADduction: Strengthening;Right;10 reps;Other (comment) (min assist) Straight Leg Raises: Strengthening;Right;10 reps (min assist) Long Arc Quad: Strengthening;Right;10 reps Knee Flexion: AROM;Seated;10 reps Goniometric ROM: 91 degrees    General Comments        Pertinent Vitals/Pain Pain Assessment: 0-10 Pain Score: 7  Pain Location: Rt knee Pain Descriptors / Indicators: Sore;Aching Pain Intervention(s): Limited activity within patient's tolerance;Monitored during session;Ice applied    Home Living                      Prior Function            PT Goals (current goals can now be found in the care plan section) Acute Rehab PT Goals Patient Stated Goal: do the best I can PT Goal Formulation: With patient Time For Goal Achievement: 06/10/15 Potential  to Achieve Goals: Good Progress towards PT goals: Progressing toward goals    Frequency  7X/week    PT Plan Current plan remains appropriate    Co-evaluation             End of Session Equipment Utilized During  Treatment: Gait belt Activity Tolerance: Patient limited by pain;Patient limited by lethargy Patient left: in chair;with call bell/phone within reach;Other (comment) (in knee extension stretch)     Time: 4098-1191 PT Time Calculation (min) (ACUTE ONLY): 29 min  Charges:  $Gait Training: 8-22 mins $Therapeutic Exercise: 8-22 mins                    G Codes:      Christiane Ha, PT, CSCS Pager [phone] Office [phone]  05/28/2015, 2:40 PM

## 2015-05-28 NOTE — Progress Notes (Signed)
Occupational Therapy Treatment Patient Details Name: Walter Hodges MRN: [id number] DOB: 06/24/1956 Today's Date: 05/28/2015    History of present illness Rt TKA   OT comments  Pt progressing well toward OT goals. Pt still reporting significant pain even with taking pain medication. Pt able to complete LB ADLs with supervision for safety and setup. Recommendation of HHOT and supervision during ADLs and functional mobility remains appropriate at this time. Pt educated on use of 3 in 1 for shower seat and handout provided, may be beneficial to practice tub transfer with 3 in 1 next session to reinforce learning. Continue to follow pt acutely.     Follow Up Recommendations  Home health OT;Supervision - Intermittent    Equipment Recommendations  3 in 1 bedside comode    Recommendations for Other Services      Precautions / Restrictions Precautions Precautions: Knee;Fall Precaution Booklet Issued: Yes (comment) Precaution Comments: reviewed HEP Restrictions Weight Bearing Restrictions: Yes RLE Weight Bearing: Weight bearing as tolerated       Mobility Bed Mobility Overal bed mobility: Modified Independent Bed Mobility: Supine to Sit     Supine to sit: Modified independent (Device/Increase time)     General bed mobility comments: Pt found in recliner, returned to recliner at end of session  Transfers Overall transfer level: Needs assistance Equipment used: Rolling walker (2 wheeled) Transfers: Sit to/from Stand Sit to Stand: Min guard         General transfer comment: no cues needed.     Balance Overall balance assessment: Needs assistance Sitting-balance support: Feet supported;No upper extremity supported Sitting balance-Leahy Scale: Good     Standing balance support: During functional activity Standing balance-Leahy Scale: Fair Standing balance comment: using rw                   ADL Overall ADL's : Needs assistance/impaired              Lower Body Bathing: Set up;Supervison/ safety Lower Body Bathing Details (indicate cue type and reason): Pt able to wash feet and don/doff socks with supervision for safety                        General ADL Comments: Educated pt on use of 3 in 1 for tub seat, provided handout      Vision                     Perception     Praxis      Cognition   Behavior During Therapy: [facility] for tasks assessed/performed Overall Cognitive Status: Within Functional Limits for tasks assessed                       Extremity/Trunk Assessment               Exercises Total Joint Exercises Ankle Circles/Pumps: AROM;Both;10 reps Quad Sets: Right;10 reps;Strengthening Short Arc Quad: Strengthening;Right;10 reps;Other (comment) (min assist) Heel Slides: AAROM;Right;10 reps Hip ABduction/ADduction: Strengthening;Right;10 reps;Other (comment) (min assist) Straight Leg Raises: Strengthening;Right;10 reps (min assist)   Shoulder Instructions       General Comments      Pertinent Vitals/ Pain       Pain Assessment: 0-10 Pain Score: 9  Pain Location: R knee Pain Descriptors / Indicators: Aching;Sore Pain Intervention(s): Limited activity within patient's tolerance;Monitored during session;Repositioned;Ice applied  Home Living  Prior Functioning/Environment              Frequency Min 2X/week     Progress Toward Goals  OT Goals(current goals can now be found in the care plan section)  Progress towards OT goals: Progressing toward goals  Acute Rehab OT Goals Patient Stated Goal: none stated   Plan Discharge plan remains appropriate    Co-evaluation                 End of Session     Activity Tolerance Patient tolerated treatment well   Patient Left in chair;with call bell/phone within reach   Nurse Communication          Time: 1610-9604 OT Time Calculation (min): 24  min  Charges: OT General Charges $OT Visit: 1 Procedure OT Treatments $Self Care/Home Management : 23-37 mins  Gaye Alken M.S., OTR/L Pager: [phone]  05/28/2015, 4:12 PM

## 2015-05-28 NOTE — Progress Notes (Signed)
Physical Therapy Treatment Patient Details Name: Walter Hodges MRN: [id number] DOB: Sep 19, 1955 Today's Date: 05/28/2015    History of Present Illness Rt TKA    PT Comments    Patient is making good progress with PT.  From a mobility standpoint anticipate patient will be ready for DC home with family support. Patient able to ambulate 100 feet X2 with supervision to min guard. Patient will need to trial up/down 5 steps to enter his home. Anticipate attempting steps at next session.     Follow Up Recommendations  Home health PT;Supervision for mobility/OOB     Equipment Recommendations  Rolling walker with 5" wheels    Recommendations for Other Services       Precautions / Restrictions Precautions Precautions: Knee;Fall Precaution Booklet Issued: Yes (comment) Precaution Comments: reviewed HEP Restrictions Weight Bearing Restrictions: Yes RLE Weight Bearing: Weight bearing as tolerated    Mobility  Bed Mobility Overal bed mobility: Needs Assistance Bed Mobility: Supine to Sit     Supine to sit: Supervision        Transfers Overall transfer level: Needs assistance Equipment used: Rolling walker (2 wheeled) Transfers: Sit to/from Stand Sit to Stand: Min guard         General transfer comment: no cues needed.   Ambulation/Gait Ambulation/Gait assistance: Min guard;Supervision Ambulation Distance (Feet): 200 Feet ((100 feet X2)) Assistive device: Rolling walker (2 wheeled) Gait Pattern/deviations: Step-through pattern Gait velocity: decreased   General Gait Details: encouraging knee flexion with swing phase   Stairs            Wheelchair Mobility    Modified Rankin (Stroke Patients Only)       Balance Overall balance assessment: Needs assistance Sitting-balance support: No upper extremity supported Sitting balance-Leahy Scale: Good     Standing balance support: During functional activity Standing balance-Leahy Scale: Fair Standing  balance comment: with rw                    Cognition Arousal/Alertness: Awake/alert Behavior During Therapy: WFL for tasks assessed/performed Overall Cognitive Status: Within Functional Limits for tasks assessed                      Exercises Total Joint Exercises Ankle Circles/Pumps: AROM;Both;10 reps Quad Sets: Right;10 reps;Strengthening Heel Slides: AAROM;Right;10 reps Long Arc Quad: Strengthening;Right;10 reps Knee Flexion: AROM;Seated;10 reps Goniometric ROM: 91 degrees    General Comments        Pertinent Vitals/Pain Pain Assessment: 0-10 Pain Score: 8  Pain Location: Rt knee Pain Descriptors / Indicators: Sore Pain Intervention(s): Limited activity within patient's tolerance;Monitored during session;Ice applied    Home Living                      Prior Function            PT Goals (current goals can now be found in the care plan section) Acute Rehab PT Goals Patient Stated Goal: do the best I can PT Goal Formulation: With patient Time For Goal Achievement: 06/10/15 Potential to Achieve Goals: Good Progress towards PT goals: Progressing toward goals    Frequency  7X/week    PT Plan Current plan remains appropriate    Co-evaluation             End of Session Equipment Utilized During Treatment: Gait belt Activity Tolerance: Patient limited by fatigue Patient left: in chair;with call bell/phone within reach;Other (comment) (in knee extension with ice on )  Time: 4540-9811 PT Time Calculation (min) (ACUTE ONLY): 39 min  Charges:  $Gait Training: 23-37 mins $Therapeutic Exercise: 8-22 mins                    G Codes:      Christiane Ha, PT, CSCS Pager [phone] Office [phone]  05/28/2015, 12:04 PM

## 2015-05-28 NOTE — Progress Notes (Signed)
   Subjective:  Patient reports pain as severe with ambulation.  No events.  Objective:   VITALS:   Filed Vitals:   05/27/15 0700 05/27/15 1426 05/27/15 2144 05/28/15 0500  BP: 114/63 137/70 128/66 133/74  Pulse: 80 75 92 100  Temp: 98.1 F (36.7 C) 99 F (37.2 C) 98.7 F (37.1 C) 100.1 F (37.8 C)  TempSrc: Oral Oral Oral Oral  Resp: Height:      Weight:      SpO2: 97% 96% 96% 95%    Neurologically intact ABD soft Neurovascular intact Sensation intact distally Intact pulses distally Dorsiflexion/Plantar flexion intact Incision: dressing C/D/I and no drainage No cellulitis present Compartment soft  Incision c/d/i   Lab Results  Component Value Date   WBC 10.3 05/28/2015   HGB 10.3* 05/28/2015   HCT 31.5* 05/28/2015   MCV 85.6 05/28/2015   PLT 240 05/28/2015     Assessment/Plan:  2 Days Post-Op   - Expected postop acute blood loss anemia - will monitor for symptoms - Up with PT/OT - HHPT - DVT ppx - SCDs, ambulation, aspirin - WBAT operative extremity - Pain control - Discharge planning - HHPT vs facility - dressing changed  Walter Hodges, Walter Hodges 05/28/2015, 7:55 AM [phone]

## 2015-05-28 NOTE — Progress Notes (Signed)
Orthopedic Tech Progress Note Patient Details:  Walter Hodges 03-27-1956 [id number]  Patient ID: Walter Hodges, male   DOB: 19-Jul-1956, 59 y.o.   MRN: [id number] Placed pt's rle on cpm @ 0-60 degrees   Nikki Dom 05/28/2015, 1:26 PM

## 2015-05-28 NOTE — Care Management Note (Signed)
Case Management Note  Patient Details  Name: ERVAN HEBER MRN: [id number] Date of Birth: 01-Feb-1956  Subjective/Objective:  59 yr old male s/p right total knee arthroplasty.                  Action/Plan: Case manager spoke with patient concerning home health and DME needs at discharge. Referral was called to [place].   Expected Discharge Date:   05/29/15               Expected Discharge Plan:   Home with Home Health  In-House Referral:  NA  Discharge planning Services  CM Consult  Post Acute Care Choice:  Durable Medical Equipment, Home Health Choice offered to:  Patient  DME Arranged:  3-N-1, Walker rolling DME Agency:  [facility]  HH Arranged:  PT [facility] Agency:  [facility]  Status of Service:  Completed, signed off  Medicare Important Message Given:    Date Medicare IM Given:    Medicare IM give by:    Date Additional Medicare IM Given:    Additional Medicare Important Message give by:     If discussed at Long Length of Stay Meetings, dates discussed:    Additional Comments:  Durenda Guthrie, RN 05/28/2015, 11:57 AM

## 2015-05-29 ENCOUNTER — Inpatient Hospital Stay (HOSPITAL_COMMUNITY): Payer: Self-pay

## 2015-05-29 DIAGNOSIS — Z96659 Presence of unspecified artificial knee joint: Secondary | ICD-10-CM

## 2015-05-29 LAB — CBC
HEMATOCRIT: 29.8 % — AB (ref 39.0–52.0)
Hemoglobin: 10.3 g/dL — ABNORMAL LOW (ref 13.0–17.0)
MCH: 28.9 pg (ref 26.0–34.0)
MCHC: 34.6 g/dL (ref 30.0–36.0)
MCV: 83.5 fL (ref 78.0–100.0)
Platelets: 256 10*3/uL (ref 150–400)
RBC: 3.57 MIL/uL — AB (ref 4.22–5.81)
RDW: 13.6 % (ref 11.5–15.5)
WBC: 11.4 10*3/uL — AB (ref 4.0–10.5)

## 2015-05-29 NOTE — Discharge Summary (Signed)
Physician Discharge Summary      Patient ID: Walter Hodges MRN: [id number] DOB/AGE: 1956/07/16 59 y.o.  Admit date: 05/26/2015 Discharge date: 05/30/2015  Admission Diagnoses:  <principal problem not specified>  Discharge Diagnoses:  Active Problems:   Primary osteoarthritis of right knee   Past Medical History  Diagnosis Date  . Bell's palsy 2010    lasted one day ago, told that he had a ministroke  . Hypertension     followed by Dr. Nita Sells" with [facility]    . Arthritis     Surgeries: Procedure(s): RIGHT TOTAL KNEE ARTHROPLASTY on 05/26/2015   Consultants (if any):    Discharged Condition: Improved  Hospital Course: ROHIL LESCH is an 59 y.o. male who was admitted 05/26/2015 with a diagnosis of <principal problem not specified> and went to the operating room on 05/26/2015 and underwent the above named procedures.    He was given perioperative antibiotics:      Anti-infectives    Start     Dose/Rate Route Frequency Ordered Stop   05/26/15 1800  ceFAZolin (ANCEF) IVPB 2 g/50 mL premix     2 g 100 mL/hr over 30 Minutes Intravenous Every 6 hours 05/26/15 1648 05/27/15 0013   05/26/15 1230  ceFAZolin (ANCEF) IVPB 2 g/50 mL premix     2 g 100 mL/hr over 30 Minutes Intravenous To [facility] 05/26/15 1051 05/26/15 1227    .  He was given sequential compression devices, early ambulation, and aspirin for DVT prophylaxis.  He benefited maximally from the hospital stay and there were no complications.    Recent vital signs:  Filed Vitals:   05/30/15 0545  BP: 151/81  Pulse: 81  Temp: 98.8 F (37.1 C)  Resp: 18    Recent laboratory studies:  Lab Results  Component Value Date   HGB 10.3* 05/29/2015   HGB 10.3* 05/28/2015   HGB 11.5* 05/27/2015   Lab Results  Component Value Date   WBC 11.4* 05/29/2015   PLT 256 05/29/2015   Lab Results  Component Value Date   INR 1.15 05/16/2015   Lab Results  Component Value Date   NA 134*  05/27/2015   K 3.2* 05/27/2015   CL 98* 05/27/2015   CO2 29 05/27/2015   BUN 10 05/27/2015   CREATININE 0.84 05/27/2015   GLUCOSE 120* 05/27/2015    Discharge Medications:     Medication List    TAKE these medications        aspirin EC 325 MG tablet  Take 1 tablet (325 mg total) by mouth 2 (two) times daily.     ibuprofen 800 MG tablet  Commonly known as:  ADVIL,MOTRIN  Take 800 mg by mouth every 8 (eight) hours as needed.     lisinopril-hydrochlorothiazide 20-12.5 MG per tablet  Commonly known as:  PRINZIDE,ZESTORETIC  Take 2 tablets by mouth daily.     oxyCODONE 5 MG immediate release tablet  Commonly known as:  Oxy IR/ROXICODONE  Take 1-3 tablets (5-15 mg total) by mouth every 4 (four) hours as needed.     OxyCODONE 10 mg T12a 12 hr tablet  Commonly known as:  OXYCONTIN  Take 1 tablet (10 mg total) by mouth every 12 (twelve) hours.     promethazine 25 MG tablet  Commonly known as:  PHENERGAN  Take 1 tablet (25 mg total) by mouth every 6 (six) hours as needed for nausea.     senna-docusate 8.6-50 MG per tablet  Commonly known as:  [employer]  S  Take 1 tablet by mouth at bedtime as needed.     zolpidem 10 MG tablet  Commonly known as:  AMBIEN  Take 1 tablet (10 mg total) by mouth at bedtime as needed for sleep.        Diagnostic Studies: Dg Knee Right Port  05/26/2015   CLINICAL DATA:  Right total knee replacement.  EXAM: PORTABLE RIGHT KNEE - 1-2 VIEW  COMPARISON:  None.  FINDINGS: Examination demonstrates evidence of patient's recent right total knee arthroplasty with prosthetic components intact and normally located. Skin staples of present vertically over the anterior soft tissues. A surgical drain is present with tip over the lateral soft tissues. No acute complicating features.  IMPRESSION: Evidence of recent right total knee arthroplasty intact and normally located. Surgical drain in place.   Electronically Signed   By: Elberta Fortis M.D.   On: 05/26/2015 16:19      Disposition: 01-Home or Self Care  Discharge Instructions    Call MD / Call 911    Complete by:  As directed   If you experience chest pain or shortness of breath, CALL 911 and be transported to the hospital emergency room.  If you develope a fever above 101.5 F, pus (white drainage) or increased drainage or redness at the wound, or calf pain, call your surgeon's office.     Constipation Prevention    Complete by:  As directed   Drink plenty of fluids.  Prune juice may be helpful.  You may use a stool softener, such as Colace (over the counter) 100 mg twice a day.  Use MiraLax (over the counter) for constipation as needed.     Diet - low sodium heart healthy    Complete by:  As directed      Diet general    Complete by:  As directed      Driving restrictions    Complete by:  As directed   No driving while taking narcotic pain meds.     Increase activity slowly as tolerated    Complete by:  As directed      Weight bearing as tolerated    Complete by:  As directed            Follow-up Information    Follow up with Cheral Almas, MD In 1 week.   Specialty:  Orthopedic Surgery   Why:  For wound re-check   Contact information:   [address] [place] [address] [phone]       Follow up with [facility].   Why:  Someone from [facility] will contact you concerning start date and time for therapy.   Contact information:   [address] [place] [address] [phone]        Signed: Monique, Gift 05/30/2015, 7:06 AM

## 2015-05-29 NOTE — Progress Notes (Signed)
   Subjective:  Patient c/o severe tightness and pain related to swelling.  Objective:   VITALS:   Filed Vitals:   05/28/15 1551 05/28/15 1700 05/28/15 2109 05/29/15 0549  BP: 118/64  120/60 149/69  Pulse: 103  119 101  Temp: 99.3 F (37.4 C) 99.6 F (37.6 C) 99.1 F (37.3 C) 98.9 F (37.2 C)  TempSrc: Oral  Oral Oral  Resp: Height:      Weight:      SpO2: 94%  92% 95%    Calf is nontender No blistering Strong distal pulses Negative homan's sign Dressing c/d/i No cellulitis   Lab Results  Component Value Date   WBC 11.4* 05/29/2015   HGB 10.3* 05/29/2015   HCT 29.8* 05/29/2015   MCV 83.5 05/29/2015   PLT 256 05/29/2015     Assessment/Plan:  3 Days Post-Op   - Expected postop acute blood loss anemia - will monitor for symptoms - Up with PT/OT - HHPT - DVT ppx - SCDs, ambulation, aspirin - WBAT operative extremity - Pain control - doppler to r/o DVT - unna boot for swelling - ice ATC   Elin, Seats 05/29/2015, 7:57 AM [phone]

## 2015-05-29 NOTE — Progress Notes (Signed)
Physical Therapy Treatment Patient Details Name: Walter Hodges MRN: [id number] DOB: [birth year] Today's Date: 05/29/2015    History of Present Illness Rt TKA    PT Comments    Patient with noted increased edema through the RLE along with reports of increased pain. Continuing to work on exercises and mobility as tolerated with anticipated D/C home with family assistance.   Follow Up Recommendations  Home health PT;Supervision for mobility/OOB     Equipment Recommendations  Rolling walker with 5" wheels    Recommendations for Other Services       Precautions / Restrictions Precautions Precautions: Knee;Fall Restrictions Weight Bearing Restrictions: Yes RLE Weight Bearing: Weight bearing as tolerated    Mobility  Bed Mobility               General bed mobility comments: found and returned to chair  Transfers Overall transfer level: Needs assistance Equipment used: Rolling walker (2 wheeled) Transfers: Sit to/from Stand Sit to Stand: Min guard         General transfer comment: no cues needed.   Ambulation/Gait Ambulation/Gait assistance: Min guard Ambulation Distance (Feet): 75 Feet Assistive device: Rolling walker (2 wheeled) Gait Pattern/deviations: Step-through pattern;Decreased stance time - right;Decreased step length - left;Decreased weight shift to right Gait velocity: decreased       Stairs            Wheelchair Mobility    Modified Rankin (Stroke Patients Only)       Balance Overall balance assessment: Needs assistance Sitting-balance support: No upper extremity supported Sitting balance-Leahy Scale: Good     Standing balance support: During functional activity Standing balance-Leahy Scale: Fair                      Cognition Arousal/Alertness: Awake/alert Behavior During Therapy: WFL for tasks assessed/performed Overall Cognitive Status: Within Functional Limits for tasks assessed                       Exercises Total Joint Exercises Ankle Circles/Pumps: AROM;Both;10 reps Quad Sets: Right;10 reps;Strengthening Short Arc Quad: Strengthening;Right;10 reps Heel Slides: AAROM;Right;10 reps Straight Leg Raises: Strengthening;Right;10 reps;Other (comment) (mod assist) Goniometric ROM: 72 degrees flexion    General Comments General comments (skin integrity, edema, etc.): significant edema noted throughout RLE, leg elevated and patient reclined after session. RLE in extension with ice.       Pertinent Vitals/Pain Pain Assessment: 0-10 Pain Score: 8  Pain Location: Rt knee Pain Descriptors / Indicators: Sore;Tightness Pain Intervention(s): Limited activity within patient's tolerance;Monitored during session;Ice applied;Repositioned    Home Living                      Prior Function            PT Goals (current goals can now be found in the care plan section) Acute Rehab PT Goals Patient Stated Goal: keep going PT Goal Formulation: With patient Time For Goal Achievement: 06/10/15 Potential to Achieve Goals: Good Progress towards PT goals: Not progressing toward goals - comment (decreased ambulation and ROM due to pain/swelling)    Frequency  7X/week    PT Plan Current plan remains appropriate    Co-evaluation             End of Session Equipment Utilized During Treatment: Gait belt Activity Tolerance: No increased pain Patient left: in chair;with call bell/phone within reach     Time: 1017-1047 PT Time Calculation (min) (ACUTE ONLY):  30 min  Charges:  $Gait Training: 8-22 mins $Therapeutic Exercise: 8-22 mins                    G Codes:      Walter Hodges, PT, CSCS Pager [phone] Office [phone]  05/29/2015, 11:49 AM

## 2015-05-29 NOTE — Progress Notes (Signed)
VASCULAR LAB PRELIMINARY  PRELIMINARY  PRELIMINARY  PRELIMINARY  Right lower extremity venous duplex completed.    Preliminary report:  Right:  No evidence of DVT, superficial thrombosis, or Baker's cyst.  Cestone,Helene, RVT 05/29/2015, 9:16 AM

## 2015-05-29 NOTE — Progress Notes (Signed)
Patient c/o discomfort and pain in right knee. This RN noticed that the patient's leg was swollen, red, warm with tightness. Leg negative for homan's. Called Dr. Lajoyce Corners and explained what was going on. Order for [employer] placed and completed. Patient's leg was also elevated with three pillows, placed in trendelenburg so that the leg is above the heart. Ice placed on knee. Patient was educated on keeping leg elevated. Dr. Lajoyce Corners and Dr. Roda Shutters reassured the patient that this was a normal process and that it will resolve. Will continue to monitor.

## 2015-05-29 NOTE — Progress Notes (Signed)
Orthopedic Tech Progress Note Patient Details:  Walter Hodges 05-14-1956 [id number]  Ortho Devices Type of Ortho Device: Unna boot, Ace wrap Ortho Device/Splint Location: RLE Ortho Device/Splint Interventions: Application   Asia Burnett Kanaris 05/29/2015, 6:35 AM

## 2015-05-29 NOTE — Progress Notes (Signed)
Physical Therapy Treatment Patient Details Name: Walter Hodges MRN: [id number] DOB: 1956-07-01 Today's Date: 05/29/2015    History of Present Illness Rt TKA    PT Comments    Patient with decreased mobility and general activity tolerance due to reports of pain and increased swelling in the RLE. Will continue to progress mobility for safety and independence for anticipated D/C to home with family assistance.   Follow Up Recommendations  Home health PT;Supervision for mobility/OOB     Equipment Recommendations  Rolling walker with 5" wheels    Recommendations for Other Services       Precautions / Restrictions Precautions Precautions: Knee;Fall Restrictions Weight Bearing Restrictions: Yes RLE Weight Bearing: Weight bearing as tolerated    Mobility  Bed Mobility               General bed mobility comments: found and returned to chair  Transfers Overall transfer level: Needs assistance Equipment used: Rolling walker (2 wheeled) Transfers: Sit to/from Stand Sit to Stand: Min guard         General transfer comment: no cues needed.   Ambulation/Gait Ambulation/Gait assistance: Min guard Ambulation Distance (Feet): 100 Feet Assistive device: Rolling walker (2 wheeled) Gait Pattern/deviations: Step-through pattern;Decreased weight shift to right Gait velocity: decreased       Stairs            Wheelchair Mobility    Modified Rankin (Stroke Patients Only)       Balance Overall balance assessment: Needs assistance Sitting-balance support: No upper extremity supported Sitting balance-Leahy Scale: Good     Standing balance support: During functional activity Standing balance-Leahy Scale: Fair                      Cognition Arousal/Alertness: Awake/alert Behavior During Therapy: WFL for tasks assessed/performed Overall Cognitive Status: Within Functional Limits for tasks assessed                      Exercises Total  Joint Exercises Ankle Circles/Pumps: AROM;Both;10 reps Quad Sets: Right;10 reps;Strengthening Short Arc Quad: Strengthening;Right;10 reps Heel Slides: AAROM;Right;10 reps Straight Leg Raises: Strengthening;Right;10 reps;Other (comment) (mod assist) Long Arc Quad: Strengthening;Right;10 reps (mod assist)    General Comments        Pertinent Vitals/Pain Pain Assessment: 0-10 Pain Score: 8  Pain Location: Rt knee Pain Descriptors / Indicators: Aching;Sore Pain Intervention(s): Monitored during session;Limited activity within patient's tolerance;Repositioned;Ice applied    Home Living                      Prior Function            PT Goals (current goals can now be found in the care plan section) Acute Rehab PT Goals Patient Stated Goal: have less pain PT Goal Formulation: With patient Time For Goal Achievement: 06/10/15 Potential to Achieve Goals: Good Progress towards PT goals: Progressing toward goals    Frequency  7X/week    PT Plan Current plan remains appropriate    Co-evaluation             End of Session Equipment Utilized During Treatment: Gait belt Activity Tolerance: Patient limited by pain;Other (comment) (and swelling in RLE) Patient left: in chair;with call bell/phone within reach;with family/visitor present;Other (comment) (in knee extension with ice on )     Time: 7829-5621 PT Time Calculation (min) (ACUTE ONLY): 23 min  Charges:  $Gait Training: 8-22 mins $Therapeutic Exercise: 8-22 mins  G Codes:      Christiane Ha, PT, CSCS Pager [phone] Office [phone]  05/29/2015, 4:23 PM

## 2015-05-30 MED ORDER — ZOLPIDEM TARTRATE 10 MG PO TABS: 10.0000 mg | ORAL_TABLET | Freq: Every evening | ORAL | Status: DC | PRN

## 2015-05-30 NOTE — Progress Notes (Signed)
   Subjective:  Patient c/o tightness and swelling.  Objective:   VITALS:   Filed Vitals:   05/29/15 1000 05/29/15 1419 05/29/15 2135 05/30/15 0545  BP: 144/63 116/67 122/70 151/81  Pulse: 106 94 100 81  Temp:  98.4 F (36.9 C) 97.5 F (36.4 C) 98.8 F (37.1 C)  TempSrc:  Oral Oral Oral  Resp:  Height:      Weight:      SpO2:  98% 98% 99%    Exam stable   Lab Results  Component Value Date   WBC 11.4* 05/29/2015   HGB 10.3* 05/29/2015   HCT 29.8* 05/29/2015   MCV 83.5 05/29/2015   PLT 256 05/29/2015     Assessment/Plan:  4 Days Post-Op   - d/c unna boot - d/c home today - Rx in chart   Walter Hodges, Walter Hodges 05/30/2015, 7:08 AM [phone]

## 2015-05-30 NOTE — Progress Notes (Signed)
Physical Therapy Treatment Patient Details Name: Walter Hodges MRN: [id number] DOB: February 26, 1956 Today's Date: 05/30/2015    History of Present Illness Rt TKA    PT Comments    Patient is making good progress with PT.  From a mobility standpoint anticipate patient will be ready for DC home with family assistance. Patient denies any questions or concerns after session. .     Follow Up Recommendations  Home health PT;Supervision for mobility/OOB     Equipment Recommendations  Rolling walker with 5" wheels    Recommendations for Other Services       Precautions / Restrictions Precautions Precautions: Knee Restrictions Weight Bearing Restrictions: Yes RLE Weight Bearing: Weight bearing as tolerated    Mobility  Bed Mobility               General bed mobility comments: found and returned to chair  Transfers Overall transfer level: Needs assistance Equipment used: Rolling walker (2 wheeled)   Sit to Stand: Supervision            Ambulation/Gait Ambulation/Gait assistance: Supervision Ambulation Distance (Feet): 150 Feet Assistive device: Rolling walker (2 wheeled) Gait Pattern/deviations: Step-through pattern;Decreased stance time - right;Decreased weight shift to right Gait velocity: decreased   General Gait Details: encouraging weightbearing through RLE   Stairs Stairs: Yes Stairs assistance: Min guard Stair Management: One rail Right;Step to pattern Number of Stairs: 5 General stair comments: reports feeling confident with stairs  Wheelchair Mobility    Modified Rankin (Stroke Patients Only)       Balance Overall balance assessment: Needs assistance Sitting-balance support: No upper extremity supported Sitting balance-Leahy Scale: Good     Standing balance support: During functional activity Standing balance-Leahy Scale: Fair                      Cognition Arousal/Alertness: Awake/alert Behavior During Therapy: WFL for  tasks assessed/performed Overall Cognitive Status: Within Functional Limits for tasks assessed                      Exercises      General Comments        Pertinent Vitals/Pain Pain Assessment: 0-10 Pain Score: 8  Faces Pain Scale: Hurts whole lot Pain Location: Rt knee Pain Descriptors / Indicators: Aching;Heaviness;Sore Pain Intervention(s): Limited activity within patient's tolerance;Monitored during session    Home Living                      Prior Function            PT Goals (current goals can now be found in the care plan section) Acute Rehab PT Goals Patient Stated Goal: Be able to walk around again PT Goal Formulation: With patient Time For Goal Achievement: 06/10/15 Potential to Achieve Goals: Good Progress towards PT goals: Progressing toward goals    Frequency  7X/week    PT Plan Current plan remains appropriate    Co-evaluation             End of Session Equipment Utilized During Treatment: Gait belt Activity Tolerance: Patient tolerated treatment well Patient left: in chair;Other (comment) (with OT)     Time: 4098-1191 PT Time Calculation (min) (ACUTE ONLY): 17 min  Charges:  $Gait Training: 8-22 mins                    G Codes:      Christiane Ha, PT, CSCS Pager [phone]  [phone] Office [phone]  05/30/2015, 3:30 PM

## 2015-05-30 NOTE — Progress Notes (Signed)
Physical Therapy Treatment Patient Details Name: Walter Hodges MRN: [id number] DOB: 04/19/1956 Today's Date: 05/30/2015    History of Present Illness Rt TKA    PT Comments    Patient is making good progress with PT.  From a mobility standpoint anticipate patient will be ready for DC home following afternoon PT session. Will review stairs and HEP.      Follow Up Recommendations  Home health PT;Supervision for mobility/OOB     Equipment Recommendations  Rolling walker with 5" wheels    Recommendations for Other Services       Precautions / Restrictions Precautions Precautions: Knee Precaution Booklet Issued: Yes (comment) Precaution Comments: reviewed HEP Restrictions Weight Bearing Restrictions: Yes RLE Weight Bearing: Weight bearing as tolerated    Mobility  Bed Mobility               General bed mobility comments: found and returned to chair  Transfers Overall transfer level: Needs assistance Equipment used: Rolling walker (2 wheeled) Transfers: Sit to/from Stand Sit to Stand: Supervision         General transfer comment: no cues needed.   Ambulation/Gait Ambulation/Gait assistance: Min guard Ambulation Distance (Feet): 150 Feet Assistive device: Rolling walker (2 wheeled) Gait Pattern/deviations: Step-through pattern;Decreased stance time - right;Decreased weight shift to right Gait velocity: decreased   General Gait Details: encouraging weightbearing through RLE   Stairs            Wheelchair Mobility    Modified Rankin (Stroke Patients Only)       Balance Overall balance assessment: Needs assistance Sitting-balance support: No upper extremity supported Sitting balance-Leahy Scale: Good     Standing balance support: During functional activity Standing balance-Leahy Scale: Fair                      Cognition Arousal/Alertness: Awake/alert Behavior During Therapy: WFL for tasks assessed/performed Overall Cognitive  Status: Within Functional Limits for tasks assessed                      Exercises Total Joint Exercises Ankle Circles/Pumps: AROM;Both;10 reps Quad Sets: Right;10 reps;Strengthening Towel Squeeze: Strengthening;Both;10 reps Short Arc Quad: Strengthening;Right;10 reps (mod assist) Heel Slides: AAROM;Right;10 reps Goniometric ROM: 89 degrees flexion    General Comments        Pertinent Vitals/Pain Pain Assessment: 0-10 Pain Score: 8  Pain Location: Rt knee Pain Descriptors / Indicators: Sore Pain Intervention(s): Limited activity within patient's tolerance;Monitored during session;Patient requesting pain meds-RN notified    Home Living                      Prior Function            PT Goals (current goals can now be found in the care plan section) Acute Rehab PT Goals Patient Stated Goal: Be able to walk around again PT Goal Formulation: With patient Time For Goal Achievement: 06/10/15 Potential to Achieve Goals: Good Progress towards PT goals: Progressing toward goals    Frequency  7X/week    PT Plan Current plan remains appropriate    Co-evaluation             End of Session Equipment Utilized During Treatment: Gait belt Activity Tolerance: Patient limited by pain Patient left: in chair;with call bell/phone within reach;with nursing/sitter in room     Time: 0816-0851 PT Time Calculation (min) (ACUTE ONLY): 35 min  Charges:  $Gait Training: 8-22 mins $Therapeutic Exercise: 8-22  mins                    G Codes:      Christiane Ha, PT, CSCS Pager [phone] Office [phone]  05/30/2015, 9:13 AM

## 2015-05-30 NOTE — Progress Notes (Signed)
   05/30/15 1400  OT Visit Information  Last OT Received On 05/30/15  Assistance Needed +1  History of Present Illness Rt TKA  OT Time Calculation  OT Start Time (ACUTE ONLY) 1340  OT Stop Time (ACUTE ONLY) 1412  OT Time Calculation (min) 32 min  Precautions  Precautions Knee  Pain Assessment  Pain Assessment Faces  Faces Pain Scale 8  Pain Location R knee  Pain Descriptors / Indicators Grimacing;Guarding  Pain Intervention(s) Monitored during session;Repositioned;Ice applied  Cognition  Arousal/Alertness Awake/alert  Behavior During Therapy WFL for tasks assessed/performed  Overall Cognitive Status Within Functional Limits for tasks assessed  ADL  Overall ADL's  Needs assistance/impaired  Grooming Supervision/safety;Standing  Statistician Supervision/safety;Ambulation  Toilet Transfer Details (indicate cue type and reason) stood to urinate  Toileting- Architect and Hygiene Supervision/safety  Tub/ Solicitor guard;Ambulation;3 in 1;Rolling walker  Functional mobility during ADLs Supervision/safety;Rolling walker  General ADL Comments reinforced education of use of 3 in1 and tub transfer  Restrictions  RLE Weight Bearing WBAT  Transfers  Overall transfer level Needs assistance  Equipment used Rolling walker (2 wheeled)  Sit to Stand Supervision  OT - End of Session  Equipment Utilized During Treatment Gait belt;Rolling walker  Activity Tolerance Patient tolerated treatment well  Patient left in chair;with call bell/phone within reach  OT Assessment/Plan  OT Plan Discharge plan remains appropriate  OT Frequency (ACUTE ONLY) Min 2X/week  Follow Up Recommendations Home health OT;Supervision - Intermittent  OT Equipment 3 in 1 bedside comode  OT Goal Progression  Progress towards OT goals Progressing toward goals  OT General Charges  $OT Visit 1 Procedure  OT Treatments  $Self Care/Home Management  23-37 mins  05/30/2015 Martie Round,  OTR/L Pager: [phone]

## 2015-06-17 ENCOUNTER — Ambulatory Visit: Payer: Self-pay | Attending: Orthopaedic Surgery | Admitting: Physical Therapy

## 2015-06-17 DIAGNOSIS — M25561 Pain in right knee: Secondary | ICD-10-CM | POA: Insufficient documentation

## 2015-06-17 DIAGNOSIS — M6281 Muscle weakness (generalized): Secondary | ICD-10-CM | POA: Insufficient documentation

## 2015-06-17 DIAGNOSIS — R262 Difficulty in walking, not elsewhere classified: Secondary | ICD-10-CM | POA: Insufficient documentation
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
## 2015-06-19 ENCOUNTER — Encounter: Payer: Self-pay | Admitting: Physical Therapy

## 2015-06-19 ENCOUNTER — Ambulatory Visit: Payer: Self-pay | Admitting: Physical Therapy

## 2015-06-19 DIAGNOSIS — R262 Difficulty in walking, not elsewhere classified: Secondary | ICD-10-CM

## 2015-06-19 DIAGNOSIS — M6281 Muscle weakness (generalized): Secondary | ICD-10-CM

## 2015-06-19 DIAGNOSIS — M25561 Pain in right knee: Secondary | ICD-10-CM

## 2015-06-19 NOTE — Therapy (Signed)
[place] [facility] [address] [place], [place], [address] Phone: [phone]   Fax:  [phone]  Physical Therapy Treatment  Patient Details  Name: Walter Hodges MRN: [id number] Date of Birth: 01/08/1956 Referring Provider:  Tarry KosXu, Naiping M, MD  Encounter Date: 06/19/2015      PT End of Session - 06/19/15 0924    Visit Number 2   Number of Visits 17   Date for PT Re-Evaluation 08/12/15   Authorization Type 2   Authorization Time Period 10   PT Start Time 0900   PT Stop Time 1000   PT Time Calculation (min) 60 min   Activity Tolerance Patient tolerated treatment well   Behavior During Therapy [facility] for tasks assessed/performed      Past Medical History  Diagnosis Date  . Bell's palsy 2010    lasted one day ago, told that he had a ministroke  . Hypertension     followed by Dr. Nita Sells"maryann" with [facility]    . Arthritis     Past Surgical History  Procedure Laterality Date  . Circumcision      done as a child- given inhalation   . 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  Visit Diagnosis:  Right knee pain  Difficulty walking  Muscle weakness      Subjective Assessment - 06/19/15 0910    Subjective Patient is having pain in right knee 7/10   Pain Score 7    Pain Location Knee   Pain Orientation Right        Therapeutic exercise including: SAQ x 20 large and small bolster with 3 second rest Hamstring stretch and knee flex x 20 with 30 sec holds x 3 Heel slides x 20  Quad  Set wiith towel under ankle x 5 seconds SLR x 20 x 2 Patient needs occasional verbal cueing to improve posture and cueing to correctly perform exercises slowly, holding at end of range to increase motor firing of desired muscle to encourage fatigue.                                 PT Long Term Goals - 06/17/15 1102    PT LONG TERM GOAL #1   Title Patient will be independent in home exercise program to improve strength/mobility for better functional independence with ADLs  08/12/15   PT LONG TERM GOAL #2   Title Patient (> 60 years old) will complete five times sit to stand test in < 15 seconds indicating an increased LE strength and improved balance.  08/12/15   PT LONG TERM GOAL #3   Title Patient will increase six minute walk test distance to >1000 for progression to community ambulator and improve gait ability 08/12/15   PT LONG TERM GOAL #4   Title Patient will increase 10 meter walk test to >1.7734m/s as to improve gait speed for better community ambulation and to reduce fall risk 08/12/15   PT LONG TERM GOAL #5   Title Patient will ascend/descend 4 stairs without rail assist independently without loss of balance to improve ability to get in/out of home. 08/12/15               Plan - 06/19/15  4098    Clinical Impression Statement Patiient is having decreased ROM and strength of R knee.    Pt will benefit from skilled therapeutic intervention in order to improve on the following deficits Decreased endurance;Increased edema;Decreased activity tolerance;Decreased strength;Pain;Difficulty walking;Decreased mobility;Impaired flexibility   Rehab Potential Good   PT Frequency 2x / week   PT Duration 8 weeks   PT Treatment/Interventions Passive range of motion;Scar mobilization;Manual techniques;Therapeutic exercise;Therapeutic activities;Functional mobility training;Stair training;Gait training;Electrical Stimulation;Moist Heat;Cryotherapy   PT Next Visit Plan progression of HEP   PT Home Exercise Plan hamstring stretching   Consulted and Agree with Plan of Care Patient        Problem List Patient Active Problem List   Diagnosis Date Noted  . Primary osteoarthritis of right knee 05/26/2015     [name] 06/19/2015, 9:57 AM  [place] [facility] MAIN [facility] SERVICES [address] [place], [place], [address] Phone: [phone]   Fax:  [phone]

## 2015-06-24 ENCOUNTER — Encounter: Payer: Self-pay | Admitting: Physical Therapy

## 2015-06-24 ENCOUNTER — Ambulatory Visit: Payer: Self-pay | Admitting: Physical Therapy

## 2015-06-24 DIAGNOSIS — R262 Difficulty in walking, not elsewhere classified: Secondary | ICD-10-CM

## 2015-06-24 DIAGNOSIS — M6281 Muscle weakness (generalized): Secondary | ICD-10-CM

## 2015-06-24 DIAGNOSIS — M25561 Pain in right knee: Secondary | ICD-10-CM

## 2015-06-24 NOTE — Patient Instructions (Signed)
Therapeutic exercise including: Nu-step x 5 minutes  Lunges into BOSU ball x 15 x 2 SAQ x 20 large and small bolster with 3 second rest Hamstring stretch and knee flex x 20 with 30 sec holds x 3 Seated knee flex PROM with 5 sec hold x 10 with PROM 115 deg, AROM 95 deg after stretching, extension -10 deg right knee Heel slides x 20  Quad Set wiith towel under ankle x 5 seconds SLR x 10 x 4 Patient needs cuing to perform exercises correctly. Instructed in HEP for hamstring stretching and knee flex stretching.

## 2015-06-24 NOTE — Therapy (Signed)
[facility] [address] [place], [place], [address] Phone: [phone]   Fax:  [phone]  Physical Therapy Treatment  Patient Details  Name: Walter Hodges MRN: [id number] Date of Birth: [birth year] No Data Recorded  Encounter Date: 06/24/2015      PT End of Session - 06/24/15 0905    Visit Number 3   Number of Visits 17   Date for PT Re-Evaluation 08/12/15   Authorization Type 3   Authorization Time Period 10   PT Start Time 0900   PT Stop Time 0945   PT Time Calculation (min) 45 min   Activity Tolerance Patient tolerated treatment well   Behavior During Therapy [facility] for tasks assessed/performed      Past Medical History  Diagnosis Date  . Bell's palsy 2010    lasted one day ago, told that he had a ministroke  . Hypertension     followed by Dr. Nita Sells"maryann" with [facility]    . Arthritis     Past Surgical History  Procedure Laterality Date  . Circumcision      done as a child- given inhalation   . 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  Visit Diagnosis:  Right knee pain  Difficulty walking  Muscle weakness      Subjective Assessment - 06/24/15 0904    Subjective Patient is having pain in right knee 6/10. Patient is walking up and down the steps and walking a track outside.    Currently in Pain? Yes   Pain Score 6    Pain Location Knee   Pain Orientation Right   Pain Descriptors / Indicators Aching   Pain Type Acute pain   Pain Onset 1 to 4 weeks ago   Pain Frequency Constant   Multiple Pain Sites No        Therapeutic exercise including: Nu-step x 5 minutes  Lunges into BOSU ball x 15 x 2 SAQ x 20 large and small bolster with 3 second rest Hamstring stretch and knee flex x 20 with 30 sec holds x 3 Seated  knee flex PROM with 5 sec hold x 10 with PROM 115 deg, AROM 95 deg after stretching, extension -10 deg right knee Heel slides x 20 x2 Quad Set wiith towel under ankle x 5 seconds SLR x 10 x 4 Patient needs cuing to perform exercises correctly. Instructed in HEP for hamstring stretching and knee flex stretchi                          PT Education - 06/24/15 0905    Education provided Yes   Education Details HEP   Person(s) Educated Patient   Methods Explanation   Comprehension Verbalized understanding             PT Long Term Goals - 06/17/15 1102    PT LONG TERM GOAL #1   Title Patient will be independent in home exercise program to improve strength/mobility for better functional independence with ADLs  08/12/15   PT LONG TERM GOAL #2   Title Patient (> 60 years old) will complete five times sit to stand test in < 15 seconds indicating an increased LE strength and improved balance.  08/12/15   PT LONG TERM  GOAL #3   Title Patient will increase six minute walk test distance to >1000 for progression to community ambulator and improve gait ability 08/12/15   PT LONG TERM GOAL #4   Title Patient will increase 10 meter walk test to >1.29m/s as to improve gait speed for better community ambulation and to reduce fall risk 08/12/15   PT LONG TERM GOAL #5   Title Patient will ascend/descend 4 stairs without rail assist independently without loss of balance to improve ability to get in/out of home. 08/12/15               Plan - 06/24/15 0936    Clinical Impression Statement AROM is -10 to 95 deg. Patient has increase pain during session.    Pt will benefit from skilled therapeutic intervention in order to improve on the following deficits Decreased endurance;Increased edema;Decreased activity tolerance;Decreased strength;Pain;Difficulty walking;Decreased mobility;Impaired flexibility   Rehab Potential Good   PT Frequency 2x / week   PT Duration 8 weeks   PT  Treatment/Interventions Passive range of motion;Scar mobilization;Manual techniques;Therapeutic exercise;Therapeutic activities;Functional mobility training;Stair training;Gait training;Electrical Stimulation;Moist Heat;Cryotherapy   PT Next Visit Plan progression of HEP   PT Home Exercise Plan hamstring stretching   Consulted and Agree with Plan of Care Patient        Problem List Patient Active Problem List   Diagnosis Date Noted  . Primary osteoarthritis of right knee 05/26/2015    Walter Hodges 06/24/2015, 9:38 AM  [facility] [facility] MAIN [facility] SERVICES [address] [place], [place], [address] Phone: [phone]   Fax:  [phone]  Name: Walter Hodges MRN: [id number] Date of Birth: September 22, 1955

## 2015-06-26 ENCOUNTER — Ambulatory Visit: Payer: Self-pay | Admitting: Physical Therapy

## 2015-06-26 ENCOUNTER — Encounter: Payer: Self-pay | Admitting: Physical Therapy

## 2015-06-26 DIAGNOSIS — M25561 Pain in right knee: Secondary | ICD-10-CM

## 2015-06-26 DIAGNOSIS — M6281 Muscle weakness (generalized): Secondary | ICD-10-CM

## 2015-06-26 DIAGNOSIS — R262 Difficulty in walking, not elsewhere classified: Secondary | ICD-10-CM

## 2015-06-26 NOTE — Therapy (Signed)
[place] [facility] MAIN [facility] SERVICES [address] [place], [place], [address] Phone: [phone]   Fax:  [phone]  Physical Therapy Treatment  Patient Details  Name: Walter Hodges MRN: [id number] Date of Birth: Apr 09, 1956 No Data Recorded  Encounter Date: 06/26/2015      PT End of Session - 06/26/15 0945    Visit Number 3   Number of Visits 17   Date for PT Re-Evaluation 08/12/15   Authorization Type 3   Authorization Time Period 10   Activity Tolerance Patient tolerated treatment well   Behavior During Therapy [facility] for tasks assessed/performed      Past Medical History  Diagnosis Date  . Bell's palsy 2010    lasted one day ago, told that he had a ministroke  . Hypertension     followed by Dr. Nita Sells" with [facility]    . Arthritis     Past Surgical History  Procedure Laterality Date  . Circumcision      done as a child- given inhalation   . 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  Visit Diagnosis:  Right knee pain  Difficulty walking  Muscle weakness      Subjective Assessment - 06/26/15 0944    Subjective Patient is having pain in right knee 7/10. Patient is walking up and down the steps and walking a track outside. Patient is not able to take the pain medicition.    Pain Onset 1 to 4 weeks ago         Nu-step x 5 minutes  SAQ x 20 large and small bolster with 3 second rest Hamstring stretch and knee flex x 20 with 30 sec holds x 3 Seated knee flex PROM with 5 sec hold x 10 with PROM 105 deg, AROM 95 deg after stretching, extension -10 deg right knee Heel slides x 20 x2 Quad Set wiith towel under ankle x 5 seconds SLR x 10 x 4 Patient needs cuing to perform exercises correctly. Instructed in HEP for hamstring stretching and  knee flex stretchi                                                         PT Education - 06/26/15 0945    Education provided Yes   Education Details HEP   Person(s) Educated Patient   Methods Explanation   Comprehension Verbalized understanding             PT Long Term Goals - 06/17/15 1102    PT LONG TERM GOAL #1   Title Patient will be independent in home exercise program to improve strength/mobility for better functional independence with ADLs  08/12/15   PT LONG TERM GOAL #2   Title Patient (> 84 years old) will complete five times sit to stand test in < 15 seconds indicating an increased LE strength and improved balance.  08/12/15   PT LONG TERM GOAL #3   Title Patient will increase six minute walk test distance to >1000 for progression to community ambulator and improve gait ability 08/12/15   PT LONG TERM GOAL #4   Title Patient  will increase 10 meter walk test to >1.1938m/s as to improve gait speed for better community ambulation and to reduce fall risk 08/12/15   PT LONG TERM GOAL #5   Title Patient will ascend/descend 4 stairs without rail assist independently without loss of balance to improve ability to get in/out of home. 08/12/15               Plan - 06/26/15 0946    Clinical Impression Statement AROM is -10 to 105 deg. Patient has increased pain during session.   Pt will benefit from skilled therapeutic intervention in order to improve on the following deficits Decreased endurance;Increased edema;Decreased activity tolerance;Decreased strength;Pain;Difficulty walking;Decreased mobility;Impaired flexibility   Rehab Potential Good   PT Frequency 2x / week   PT Duration 8 weeks   PT Treatment/Interventions Passive range of motion;Scar mobilization;Manual techniques;Therapeutic exercise;Therapeutic activities;Functional mobility training;Stair training;Gait training;Electrical Stimulation;Moist Heat;Cryotherapy   PT  Next Visit Plan progression of HEP   PT Home Exercise Plan hamstring stretching   Consulted and Agree with Plan of Care Patient        Problem List Patient Active Problem List   Diagnosis Date Noted  . Primary osteoarthritis of right knee 05/26/2015    [name], Kristine S 06/26/2015, 9:47 AM  [place] [facility] [address] [place], [place], [address] Phone: [phone]   Fax:  [phone]  Name: Walter Hodges MRN: [id number] Date of Birth: 1955/10/23

## 2015-06-30 ENCOUNTER — Encounter: Payer: Self-pay | Admitting: Physical Therapy

## 2015-06-30 ENCOUNTER — Ambulatory Visit: Payer: Self-pay | Admitting: Physical Therapy

## 2015-06-30 DIAGNOSIS — R262 Difficulty in walking, not elsewhere classified: Secondary | ICD-10-CM

## 2015-06-30 DIAGNOSIS — M6281 Muscle weakness (generalized): Secondary | ICD-10-CM

## 2015-06-30 DIAGNOSIS — M25561 Pain in right knee: Secondary | ICD-10-CM

## 2015-06-30 NOTE — Therapy (Signed)
[facility] [address] [place], [place], [address] Phone: [phone]   Fax:  [phone]  Physical Therapy Treatment  Patient Details  Name: Walter Hodges MRN: [id number] Date of Birth: May 05, 1956 No Data Recorded  Encounter Date: 06/30/2015      PT End of Session - 06/30/15 0949    Visit Number 4   Number of Visits 17   Date for PT Re-Evaluation 08/12/15   Authorization Type 4   Authorization Time Period 10   Activity Tolerance Patient tolerated treatment well   Behavior During Therapy [facility] for tasks assessed/performed      Past Medical History  Diagnosis Date  . Bell's palsy 2010    lasted one day ago, told that he had a ministroke  . Hypertension     followed by Dr. Nita Sells"maryann" [facility]    . Arthritis     Past Surgical History  Procedure Laterality Date  . Circumcision      done as a child- given inhalation   . 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  Visit Diagnosis:  Right knee pain  Difficulty walking  Muscle weakness      Subjective Assessment - 06/30/15 0936    Subjective Patient has pain in right knee 9/10 todlay He had a terrible night but is not able ot take any medicine. He tried the heating pad but PT recommended ice.    Pain Score 8    Pain Location Knee   Pain Onset 1 to 4 weeks ago      Therapeutic exercise including: Nu-step x 5 minutes  SAQ x 20 large and small bolster with 3 second rest Hamstring stretch and knee flex x 20 with 30 sec holds x 3 Seated knee flex PROM with 5 sec hold x 10 with PROM 115 deg, AROM 95 deg after stretching, extension -10 deg right knee Heel slides x 20 x2 Quad Set wiith towel under ankle x 5 seconds SLR x 10 x 4 Patient needs cuing to perform exercises  correctly. Instructed in HEP for hamstring stretching and knee flex stretchig                           PT Education - 06/30/15 0948    Education provided Yes   Education Details HEP   Person(s) Educated Patient   Methods Explanation   Comprehension Verbalized understanding             PT Long Term Goals - 06/17/15 1102    PT LONG TERM GOAL #1   Title Patient will be independent in home exercise program to improve strength/mobility for better functional independence with ADLs  08/12/15   PT LONG TERM GOAL #2   Title Patient (> 60 years old) will complete five times sit to stand test in < 15 seconds indicating an increased LE strength and improved balance.  08/12/15   PT LONG TERM GOAL #3   Title Patient will increase six minute walk test distance to >1000 for progression to community ambulator and improve gait ability 08/12/15   PT LONG TERM GOAL #4   Title Patient will increase 10 meter walk test to >1.567m/s as to improve gait speed for better community ambulation and to  reduce fall risk 08/12/15   PT LONG TERM GOAL #5   Title Patient will ascend/descend 4 stairs without rail assist independently without loss of balance to improve ability to get in/out of home. 08/12/15               Plan - 06/30/15 0949    Clinical Impression Statement AROM is -3 to 100 deg to right knee.  Patient is limiited by pain.    Pt will benefit from skilled therapeutic intervention in order to improve on the following deficits Decreased endurance;Increased edema;Decreased activity tolerance;Decreased strength;Pain;Difficulty walking;Decreased mobility;Impaired flexibility   Rehab Potential Good   PT Frequency 2x / week   PT Duration 8 weeks   PT Treatment/Interventions Passive range of motion;Scar mobilization;Manual techniques;Therapeutic exercise;Therapeutic activities;Functional mobility training;Stair training;Gait training;Electrical Stimulation;Moist Heat;Cryotherapy   PT  Next Visit Plan progression of HEP   PT Home Exercise Plan hamstring stretching   Consulted and Agree with Plan of Care Patient        Problem List Patient Active Problem List   Diagnosis Date Noted  . Primary osteoarthritis of right knee 05/26/2015    Walter Hodges 06/30/2015, 10:25 AM   [facility] MAIN [facility] SERVICES [address] [place], [place], [address] Phone: [phone]   Fax:  [phone]  Name: Walter Hodges MRN: [id number] Date of Birth: [birth year]

## 2015-07-03 ENCOUNTER — Ambulatory Visit: Payer: Self-pay | Admitting: Physical Therapy

## 2015-07-03 ENCOUNTER — Encounter: Payer: Self-pay | Admitting: Physical Therapy

## 2015-07-03 DIAGNOSIS — R262 Difficulty in walking, not elsewhere classified: Secondary | ICD-10-CM

## 2015-07-03 DIAGNOSIS — M25561 Pain in right knee: Secondary | ICD-10-CM

## 2015-07-03 DIAGNOSIS — M6281 Muscle weakness (generalized): Secondary | ICD-10-CM

## 2015-07-03 NOTE — Therapy (Signed)
[place] [facility] MAIN [facility] SERVICES [address] [place], [place], [address] Phone: [phone]   Fax:  [phone]  Physical Therapy Treatment  Patient Details  Name: Walter Hodges MRN: [id number] Date of Birth: Jan 19, 1956 No Data Recorded  Encounter Date: 07/03/2015      PT End of Session - 07/03/15 1021    Visit Number 5   Number of Visits 17   Date for PT Re-Evaluation 08/12/15   Authorization Type 5   Authorization Time Period 10   PT Start Time 0915   PT Stop Time 1000   PT Time Calculation (min) 45 min   Activity Tolerance Patient tolerated treatment well   Behavior During Therapy [facility] for tasks assessed/performed      Past Medical History  Diagnosis Date  . Bell'Hodges palsy 2010    lasted one day ago, told that he had a ministroke  . Hypertension     followed by Dr. Nita Sells" with [facility]    . Arthritis     Past Surgical History  Procedure Laterality Date  . Circumcision      done as a child- given inhalation   . 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  Visit Diagnosis:  Right knee pain  Difficulty walking  Muscle weakness      Subjective Assessment - 07/03/15 0958    Subjective Patient has pain in right knee 7/10 todlay He took his  medicine..   Currently in Pain? Yes   Pain Score 7    Pain Location Knee   Pain Descriptors / Indicators Aching   Pain Onset 1 to 4 weeks ago   Pain Frequency Constant   Multiple Pain Sites No      SAQ x 20 large and small bolster with 3 second rest Hamstring stretch and knee flex x 20 with 30 sec holds x 3 Seated knee flex PROM with 5 sec hold x 10 with PROM 105 deg, AROM 95 deg after stretching, extension -10 deg right knee Heel slides x 20 x2 Quad Set wiith towel under ankle x 5 seconds SLR  x 10 x 4 PT provided min - moderate verbal instruction to improve set up, proper use of LE, and improved posture and gait mechanics. Patient responded moderately to instruction                           PT Education - 07/03/15 1004    Education provided Yes   Education Details HEP   Person(Hodges) Educated Patient   Methods Explanation   Comprehension Verbalized understanding             PT Long Term Goals - 06/17/15 1102    PT LONG TERM GOAL #1   Title Patient will be independent in home exercise program to improve strength/mobility for better functional independence with ADLs  08/12/15   PT LONG TERM GOAL #2   Title Patient (> 33 years old) will complete five times sit to stand test in < 15 seconds indicating an increased LE strength and improved balance.  08/12/15   PT LONG TERM GOAL #3   Title Patient will increase six minute walk test distance to >1000 for progression to community ambulator and improve gait ability 08/12/15   PT  LONG TERM GOAL #4   Title Patient will increase 10 meter walk test to >1.5967m/Hodges as to improve gait speed for better community ambulation and to reduce fall risk 08/12/15   PT LONG TERM GOAL #5   Title Patient will ascend/descend 4 stairs without rail assist independently without loss of balance to improve ability to get in/out of home. 08/12/15               Plan - 07/03/15 1022    Clinical Impression Statement AROM of right knee to 100 deg. Patient is limited by pain.    Pt will benefit from skilled therapeutic intervention in order to improve on the following deficits Decreased endurance;Increased edema;Decreased activity tolerance;Decreased strength;Pain;Difficulty walking;Decreased mobility;Impaired flexibility   Rehab Potential Good   PT Frequency 2x / week   PT Duration 8 weeks   PT Treatment/Interventions Passive range of motion;Scar mobilization;Manual techniques;Therapeutic exercise;Therapeutic activities;Functional  mobility training;Stair training;Gait training;Electrical Stimulation;Moist Heat;Cryotherapy   PT Next Visit Plan progression of HEP   PT Home Exercise Plan hamstring stretching   Consulted and Agree with Plan of Care Patient        Problem List Patient Active Problem List   Diagnosis Date Noted  . Primary osteoarthritis of right knee 05/26/2015    Walter Hodges 07/03/2015, 10:25 AM  [place] [facility] [address] [place], [place], [address] Phone: [phone]   Fax:  [phone]  Name: Walter Hodges MRN: [id number] Date of Birth: 1956/06/19

## 2015-07-08 ENCOUNTER — Ambulatory Visit: Payer: Self-pay | Attending: Orthopaedic Surgery | Admitting: Physical Therapy

## 2015-07-08 ENCOUNTER — Encounter: Payer: Self-pay | Admitting: Physical Therapy

## 2015-07-08 DIAGNOSIS — M25561 Pain in right knee: Secondary | ICD-10-CM | POA: Insufficient documentation

## 2015-07-08 DIAGNOSIS — R262 Difficulty in walking, not elsewhere classified: Secondary | ICD-10-CM | POA: Insufficient documentation

## 2015-07-08 DIAGNOSIS — M6281 Muscle weakness (generalized): Secondary | ICD-10-CM | POA: Insufficient documentation

## 2015-07-08 NOTE — Therapy (Signed)
[place] [facility] [address] [place], [place], [address] Phone: [phone]   Fax:  [phone]  Physical Therapy Treatment  Patient Details  Name: Walter Hodges MRN: [id number] Date of Birth: 1955/11/23 No Data Recorded  Encounter Date: 07/08/2015      PT End of Session - 07/08/15 0956    Visit Number 6   Number of Visits 17   Date for PT Re-Evaluation 08/12/15   Authorization Type 5   Authorization Time Period 10   PT Start Time 0915   PT Stop Time 1000   PT Time Calculation (min) 45 min   Activity Tolerance Patient tolerated treatment well   Behavior During Therapy [facility] for tasks assessed/performed      Past Medical History  Diagnosis Date  . Bell's palsy 2010    lasted one day ago, told that he had a ministroke  . Hypertension     followed by Dr. Nita Sells"maryann" with [facility]    . Arthritis     Past Surgical History  Procedure Laterality Date  . Circumcision      done as a child- given inhalation   . 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  Visit Diagnosis:  Right knee pain  Difficulty walking  Muscle weakness      Subjective Assessment - 07/08/15 0911    Subjective Patient has pain in right knee 8/10 todlay He took his  medicine..   Pain Score 8    Pain Location Knee   Pain Orientation Right   Pain Descriptors / Indicators Aching   Pain Onset 1 to 4 weeks ago        Cross trainner x 15 mins SAQ x 20 x 2 Quad sets and knee flex with RTB x 20  AROM is 0-96 deg  Ice x 10 mintues Patient continues to have high level of pain with intermittent pain mediation adherence.                          PT Education - 07/08/15 0956    Education provided Yes   Person(s) Educated Patient   Methods  Explanation   Comprehension Verbalized understanding             PT Long Term Goals - 06/17/15 1102    PT LONG TERM GOAL #1   Title Patient will be independent in home exercise program to improve strength/mobility for better functional independence with ADLs  08/12/15   PT LONG TERM GOAL #2   Title Patient (> 125 years old) will complete five times sit to stand test in < 15 seconds indicating an increased LE strength and improved balance.  08/12/15   PT LONG TERM GOAL #3   Title Patient will increase six minute walk test distance to >1000 for progression to community ambulator and improve gait ability 08/12/15   PT LONG TERM GOAL #4   Title Patient will increase 10 meter walk test to >1.4959m/s as to improve gait speed for better community ambulation and to reduce fall risk 08/12/15   PT LONG TERM GOAL #5   Title Patient will ascend/descend 4 stairs without rail assist independently without loss of balance to improve ability to get in/out of home. 08/12/15  Plan - 07/08/15 0956    Clinical Impression Statement AROM is 0- 96 deg right knee. Patient is having high level of pain 8/10 with exercises and is limited by pain.    Pt will benefit from skilled therapeutic intervention in order to improve on the following deficits Decreased endurance;Increased edema;Decreased activity tolerance;Decreased strength;Pain;Difficulty walking;Decreased mobility;Impaired flexibility   Rehab Potential Good   PT Frequency 2x / week   PT Duration 8 weeks   PT Treatment/Interventions Passive range of motion;Scar mobilization;Manual techniques;Therapeutic exercise;Therapeutic activities;Functional mobility training;Stair training;Gait training;Electrical Stimulation;Moist Heat;Cryotherapy   PT Next Visit Plan progression of HEP   PT Home Exercise Plan hamstring stretching   Consulted and Agree with Plan of Care Patient        Problem List Patient Active Problem List   Diagnosis Date  Noted  . Primary osteoarthritis of right knee 05/26/2015    [name] 07/08/2015, 9:58 AM  [place] [facility] MAIN [facility] SERVICES [address] [place], [place], [address] Phone: [phone]   Fax:  [phone]  Name: Walter Hodges MRN: [id number] Date of Birth: 1955-12-08

## 2015-07-10 ENCOUNTER — Ambulatory Visit: Payer: Self-pay | Admitting: Physical Therapy

## 2015-07-10 ENCOUNTER — Encounter: Payer: Self-pay | Admitting: Physical Therapy

## 2015-07-10 DIAGNOSIS — R262 Difficulty in walking, not elsewhere classified: Secondary | ICD-10-CM

## 2015-07-10 DIAGNOSIS — M25561 Pain in right knee: Secondary | ICD-10-CM

## 2015-07-10 DIAGNOSIS — M6281 Muscle weakness (generalized): Secondary | ICD-10-CM

## 2015-07-10 NOTE — Therapy (Signed)
[place] [facility] [address] [place], [place], [address] Phone: [phone]   Fax:  [phone]  Physical Therapy Treatment  Patient Details  Name: Walter Hodges MRN: [id number] Date of Birth: May 10, 1956 No Data Recorded  Encounter Date: 07/10/2015      PT End of Session - 07/10/15 0929    Visit Number 7   Number of Visits 17   Date for PT Re-Evaluation 08/12/15   Authorization Type 6   Authorization Time Period 10   Activity Tolerance Patient tolerated treatment well   Behavior During Therapy [facility] for tasks assessed/performed      Past Medical History  Diagnosis Date  . Bell's palsy 2010    lasted one day ago, told that he had a ministroke  . Hypertension     followed by Dr. Nita Sells"maryann" with [facility]    . Arthritis     Past Surgical History  Procedure Laterality Date  . Circumcision      done as a child- given inhalation   . 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  Visit Diagnosis:  Right knee pain  Difficulty walking  Muscle weakness      Subjective Assessment - 07/10/15 0927    Subjective Patient has pain in right knee 6/10 todlay He took his  medicine.Marland Kitchen.He is trying to see if he can get something to decreased his knee swelling.    Pain Score 6    Pain Location Knee   Pain Orientation Right   Pain Descriptors / Indicators Aching   Pain Onset 1 to 4 weeks ago      Therapeutic exercise; Cross trainner x 15 minutes Standing knee extension x 10 with ten second hold Knee flex -3-113 right knee seated Quad sets and hamstring stretching x 30 sec x 3 `                            PT Education - 07/10/15 0929    Education provided Yes   Education Details HEP   Person(s) Educated Patient   Methods  Explanation   Comprehension Verbalized understanding             PT Long Term Goals - 06/17/15 1102    PT LONG TERM GOAL #1   Title Patient will be independent in home exercise program to improve strength/mobility for better functional independence with ADLs  08/12/15   PT LONG TERM GOAL #2   Title Patient (> 421 years old) will complete five times sit to stand test in < 15 seconds indicating an increased LE strength and improved balance.  08/12/15   PT LONG TERM GOAL #3   Title Patient will increase six minute walk test distance to >1000 for progression to community ambulator and improve gait ability 08/12/15   PT LONG TERM GOAL #4   Title Patient will increase 10 meter walk test to >1.6964m/s as to improve gait speed for better community ambulation and to reduce fall risk 08/12/15   PT LONG TERM GOAL #5   Title Patient will ascend/descend 4 stairs without rail assist independently without loss of balance to improve ability to get in/out of home. 08/12/15  Plan - 07/10/15 0930    Clinical Impression Statement AROM    Pt will benefit from skilled therapeutic intervention in order to improve on the following deficits Decreased endurance;Increased edema;Decreased activity tolerance;Decreased strength;Pain;Difficulty walking;Decreased mobility;Impaired flexibility   Rehab Potential Good   PT Frequency 2x / week   PT Duration 8 weeks   PT Treatment/Interventions Passive range of motion;Scar mobilization;Manual techniques;Therapeutic exercise;Therapeutic activities;Functional mobility training;Stair training;Gait training;Electrical Stimulation;Moist Heat;Cryotherapy   PT Next Visit Plan progression of HEP   PT Home Exercise Plan hamstring stretching   Consulted and Agree with Plan of Care Patient        Problem List Patient Active Problem List   Diagnosis Date Noted  . Primary osteoarthritis of right knee 05/26/2015    Walter Hodges 07/10/2015, 10:00  AM  [place] [facility] MAIN [facility] SERVICES [address] [place], [place], [address] Phone: [phone]   Fax:  [phone]  Name: Walter Hodges MRN: [id number] Date of Birth: 1955-09-08

## 2015-07-15 ENCOUNTER — Ambulatory Visit: Payer: Self-pay | Admitting: Physical Therapy

## 2015-07-15 ENCOUNTER — Encounter: Payer: Self-pay | Admitting: Physical Therapy

## 2015-07-15 DIAGNOSIS — M6281 Muscle weakness (generalized): Secondary | ICD-10-CM

## 2015-07-15 DIAGNOSIS — R262 Difficulty in walking, not elsewhere classified: Secondary | ICD-10-CM

## 2015-07-15 DIAGNOSIS — M25561 Pain in right knee: Secondary | ICD-10-CM

## 2015-07-15 NOTE — Therapy (Signed)
[facility] [address] [place], [place], [address] Phone: [phone]   Fax:  [phone]  Physical Therapy Treatment  Patient Details  Name: Walter Hodges MRN: [id number] Date of Birth: 1956-02-12 No Data Recorded  Encounter Date: 07/15/2015      PT End of Session - 07/15/15 0943    Visit Number 8   Number of Visits 17   Date for PT Re-Evaluation 08/12/15   Authorization Type 8   Authorization Time Period 10   PT Start Time 0915   PT Stop Time 1000   PT Time Calculation (min) 45 min   Activity Tolerance Patient tolerated treatment well   Behavior During Therapy [facility] for tasks assessed/performed      Past Medical History  Diagnosis Date  . Bell's palsy 2010    lasted one day ago, told that he had a ministroke  . Hypertension     followed by Dr. Nita Sells"maryann" [facility]    . Arthritis     Past Surgical History  Procedure Laterality Date  . Circumcision      done as a child- given inhalation   . 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  Visit Diagnosis:  Right knee pain  Difficulty walking  Muscle weakness      Subjective Assessment - 07/15/15 0941    Subjective Patient has pain in right knee 9/10 todlay He took his  medicine.Marland Kitchen.He is trying to see if he can get something to decreased his knee swelling.    Pain Score 8    Pain Location Knee   Pain Orientation Right   Pain Descriptors / Indicators Aching   Pain Onset 1 to 4 weeks ago         Cross trainner x 15 mins Standing hamstring stretch x 30 sec x 3 Knee extension in standing and supine x 3 sets x 3 Standing knee flex x 30 sec x 3 SAQ x 20 x 2 Quad sets and knee flex with RTB x 20  AROM is 0-110  deg  Ice x 10 mintues Patient continues to have high  level of pain with intermittent pain mediation adherence.                               PT Long Term Goals - 06/17/15 1102    PT LONG TERM GOAL #1   Title Patient will be independent in home exercise program to improve strength/mobility for better functional independence with ADLs  08/12/15   PT LONG TERM GOAL #2   Title Patient (> 60 years old) will complete five times sit to stand test in < 15 seconds indicating an increased LE strength and improved balance.  08/12/15   PT LONG TERM GOAL #3   Title Patient will increase six minute walk test distance to >1000 for progression to community ambulator and improve gait ability 08/12/15   PT LONG TERM GOAL #4   Title Patient will increase 10 meter walk test to >1.2943m/s as to improve gait speed for better community ambulation and to reduce fall risk 08/12/15   PT LONG TERM GOAL #5   Title Patient will ascend/descend 4 stairs without rail assist independently without loss of balance to  improve ability to get in/out of home. 08/12/15               Plan - 07/15/15 0944    Clinical Impression Statement Patient has AROM 0-95 deg right knee. He continues to have high pain levels but is able to perform exercises for increasing ROM and strength.    Pt will benefit from skilled therapeutic intervention in order to improve on the following deficits Decreased endurance;Increased edema;Decreased activity tolerance;Decreased strength;Pain;Difficulty walking;Decreased mobility;Impaired flexibility   Rehab Potential Good   PT Frequency 2x / week   PT Duration 8 weeks   PT Treatment/Interventions Passive range of motion;Scar mobilization;Manual techniques;Therapeutic exercise;Therapeutic activities;Functional mobility training;Stair training;Gait training;Electrical Stimulation;Moist Heat;Cryotherapy   PT Next Visit Plan progression of HEP   PT Home Exercise Plan hamstring stretching   Consulted and Agree with Plan of Care Patient         Problem List Patient Active Problem List   Diagnosis Date Noted  . Primary osteoarthritis of right knee 05/26/2015    [name] 07/15/2015, 9:55 AM  [place] [facility] MAIN [facility] SERVICES [address] [place], [place], [address] Phone: [phone]   Fax:  [phone]  Name: Walter Hodges MRN: [id number] Date of Birth: [birth year]

## 2015-07-17 ENCOUNTER — Encounter: Payer: Self-pay | Admitting: Physical Therapy

## 2015-07-17 ENCOUNTER — Ambulatory Visit: Payer: Self-pay | Admitting: Physical Therapy

## 2015-07-17 DIAGNOSIS — M25561 Pain in right knee: Secondary | ICD-10-CM

## 2015-07-17 DIAGNOSIS — M6281 Muscle weakness (generalized): Secondary | ICD-10-CM

## 2015-07-17 DIAGNOSIS — R262 Difficulty in walking, not elsewhere classified: Secondary | ICD-10-CM

## 2015-07-17 NOTE — Therapy (Signed)
[facility] [address] [place], [place], [address] Phone: [phone]   Fax:  [phone]  Physical Therapy Treatment  Patient Details  Name: Walter HolmsMichael A Hodges MRN: [id number] Date of Birth: 02-26-1956 No Data Recorded  Encounter Date: 07/17/2015      PT End of Session - 07/17/15 1005    Visit Number 9   Date for PT Re-Evaluation 08/12/15   PT Start Time 0915   PT Stop Time 0930   PT Time Calculation (min) 15 min   Activity Tolerance Patient tolerated treatment well      Past Medical History  Diagnosis Date  . Bell's palsy 2010    lasted one day ago, told that he had a ministroke  . Hypertension     followed by Dr. Nita Sells"maryann" with [facility]    . Arthritis     Past Surgical History  Procedure Laterality Date  . Circumcision      done as a child- given inhalation   . 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  Visit Diagnosis:  Right knee pain  Difficulty walking  Muscle weakness      Subjective Assessment - 07/17/15 1003    Subjective Muscle fatigue but no major pain complaints      Therapeutic exercise:   AROM of right knee flex/ext with cross trainer and no reports of increased pain.                          PT Education - 07/17/15 1004    Education provided Yes   Education Details HEP   Person(s) Educated Patient   Methods Explanation   Comprehension Verbalized understanding             PT Long Term Goals - 06/17/15 1102    PT LONG TERM GOAL #1   Title Patient will be independent in home exercise program to improve strength/mobility for better functional independence with ADLs  08/12/15   PT LONG TERM GOAL #2   Title Patient (> 60 years old) will complete five times sit to stand test  in < 15 seconds indicating an increased LE strength and improved balance.  08/12/15   PT LONG TERM GOAL #3   Title Patient will increase six minute walk test distance to >1000 for progression to community ambulator and improve gait ability 08/12/15   PT LONG TERM GOAL #4   Title Patient will increase 10 meter walk test to >1.4882m/s as to improve gait speed for better community ambulation and to reduce fall risk 08/12/15   PT LONG TERM GOAL #5   Title Patient will ascend/descend 4 stairs without rail assist independently without loss of balance to improve ability to get in/out of home. 08/12/15               Plan - 07/17/15 1007    Clinical Impression Statement Patient reports increased pain today.   Pt will benefit from skilled therapeutic intervention in order to improve on the following deficits Decreased endurance;Increased edema;Decreased activity tolerance;Decreased strength;Pain;Difficulty walking;Decreased mobility;Impaired flexibility   Rehab Potential Good   PT Frequency 2x / week   PT Duration 8 weeks   PT Treatment/Interventions Passive range of motion;Scar mobilization;Manual techniques;Therapeutic exercise;Therapeutic activities;Functional mobility training;Stair training;Gait training;Electrical  Stimulation;Moist Heat;Cryotherapy   PT Next Visit Plan progression of HEP   PT Home Exercise Plan hamstring stretching   Consulted and Agree with Plan of Care Patient        Problem List Patient Active Problem List   Diagnosis Date Noted  . Primary osteoarthritis of right knee 05/26/2015    Walter Hodges 07/17/2015, 10:08 AM  [place] [facility] MAIN [facility] SERVICES [address] [place], [place], [address] Phone: [phone]   Fax:  [phone]  Name: Walter Hodges MRN: [id number] Date of Birth: [birth year]

## 2015-07-22 ENCOUNTER — Ambulatory Visit: Payer: Self-pay | Admitting: Physical Therapy

## 2015-07-24 ENCOUNTER — Ambulatory Visit: Payer: Self-pay | Admitting: Physical Therapy

## 2015-07-24 DIAGNOSIS — M25561 Pain in right knee: Secondary | ICD-10-CM

## 2015-07-24 DIAGNOSIS — R262 Difficulty in walking, not elsewhere classified: Secondary | ICD-10-CM

## 2015-07-24 DIAGNOSIS — M6281 Muscle weakness (generalized): Secondary | ICD-10-CM

## 2015-07-24 NOTE — Therapy (Signed)
[facility] [address] [place], [place], [address] Phone: [phone]   Fax:  [phone]  Physical Therapy Treatment  Patient Details  Name: Walter Hodges MRN: [id number] Date of Birth: 10-20-1955 No Data Recorded  Encounter Date: 07/24/2015    Past Medical History  Diagnosis Date  . Bell's palsy 2010    lasted one day ago, told that he had a ministroke  . Hypertension     followed by Dr. Nita Sells"maryann" with [facility]    . Arthritis     Past Surgical History  Procedure Laterality Date  . Circumcision      done as a child- given inhalation   . 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  Visit Diagnosis:  Right knee pain  Difficulty walking  Muscle weakness   Patient was sick with a fever. He arrived for his appointment but he was sent home due to illness.                                  PT Long Term Goals - 06/17/15 1102    PT LONG TERM GOAL #1   Title Patient will be independent in home exercise program to improve strength/mobility for better functional independence with ADLs  08/12/15   PT LONG TERM GOAL #2   Title Patient (> 60 years old) will complete five times sit to stand test in < 15 seconds indicating an increased LE strength and improved balance.  08/12/15   PT LONG TERM GOAL #3   Title Patient will increase six minute walk test distance to >1000 for progression to community ambulator and improve gait ability 08/12/15   PT LONG TERM GOAL #4   Title Patient will increase 10 meter walk test to >1.4726m/s as to improve gait speed for better community ambulation and to reduce fall risk 08/12/15   PT LONG TERM GOAL #5   Title Patient will ascend/descend 4 stairs without rail assist independently without  loss of balance to improve ability to get in/out of home. 08/12/15               Problem List Patient Active Problem List   Diagnosis Date Noted  . Primary osteoarthritis of right knee 05/26/2015    [name], Nyko Gell S 07/24/2015, 12:27 PM  [place] [facility] [address] [place], [place], [address] Phone: [phone]   Fax:  [phone]  Name: Walter Hodges MRN: [id number] Date of Birth: 10-20-1955

## 2015-07-29 ENCOUNTER — Ambulatory Visit: Payer: Self-pay | Admitting: Physical Therapy

## 2015-07-29 ENCOUNTER — Encounter: Payer: Self-pay | Admitting: Physical Therapy

## 2015-07-29 DIAGNOSIS — M6281 Muscle weakness (generalized): Secondary | ICD-10-CM

## 2015-07-29 DIAGNOSIS — M25561 Pain in right knee: Secondary | ICD-10-CM

## 2015-07-29 DIAGNOSIS — R262 Difficulty in walking, not elsewhere classified: Secondary | ICD-10-CM

## 2015-07-29 NOTE — Therapy (Signed)
[facility] [facility] [address] [place], [place], [address] Phone: [phone]   Fax:  [phone]  Physical Therapy Treatment  Patient Details  Name: Walter Hodges MRN: [id number] Date of Birth: 22-Feb-1956 No Data Recorded  Encounter Date: 07/29/2015      PT End of Session - 07/29/15 0923    Visit Number 10   Number of Visits 17   Date for PT Re-Evaluation 08/12/15   Authorization Type 10   PT Start Time 0915   PT Stop Time 1000   PT Time Calculation (min) 45 min      Past Medical History  Diagnosis Date  . Bell's palsy 2010    lasted one day ago, told that he had a ministroke  . Hypertension     followed by Dr. Nita Sells"maryann" with [facility]    . Arthritis     Past Surgical History  Procedure Laterality Date  . Circumcision      done as a child- given inhalation   . 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  Visit Diagnosis:  Right knee pain  Difficulty walking  Muscle weakness      Subjective Assessment - 07/29/15 0922    Subjective Muscle fatigue but no major pain complaints   Currently in Pain? Yes   Pain Score 6    Pain Location Knee      standing lunge  x 20  Sitting flex 0-110 AROM, 0-120 PROM  step ups from floor to 6 inch stool x 20 bilaterally   prone knee flex x 10 sec hold x 5 reps Ice following exercises                           PT Education - 07/29/15 0923    Education provided Yes   Education Details HEP   Person(s) Educated Patient   Methods Explanation   Comprehension Verbalized understanding             PT Long Term Goals - 06/17/15 1102    PT LONG TERM GOAL #1   Title Patient will be independent in home exercise program to improve strength/mobility for better  functional independence with ADLs  08/12/15   PT LONG TERM GOAL #2   Title Patient (> 60 years old) will complete five times sit to stand test in < 15 seconds indicating an increased LE strength and improved balance.  08/12/15   PT LONG TERM GOAL #3   Title Patient will increase six minute walk test distance to >1000 for progression to community ambulator and improve gait ability 08/12/15   PT LONG TERM GOAL #4   Title Patient will increase 10 meter walk test to >1.7348m/s as to improve gait speed for better community ambulation and to reduce fall risk 08/12/15   PT LONG TERM GOAL #5   Title Patient will ascend/descend 4 stairs without rail assist independently without loss of balance to improve ability to get in/out of home. 08/12/15               Plan - 07/29/15 0923    Clinical Impression Statement Patient has less pain today 6/10.   Pt will benefit from skilled therapeutic intervention in order to improve on the following deficits  Decreased endurance;Increased edema;Decreased activity tolerance;Decreased strength;Pain;Difficulty walking;Decreased mobility;Impaired flexibility   Rehab Potential Good   PT Frequency 2x / week   PT Duration 8 weeks   PT Treatment/Interventions Passive range of motion;Scar mobilization;Manual techniques;Therapeutic exercise;Therapeutic activities;Functional mobility training;Stair training;Gait training;Electrical Stimulation;Moist Heat;Cryotherapy   PT Next Visit Plan progression of HEP   PT Home Exercise Plan hamstring stretching   Consulted and Agree with Plan of Care Patient        Problem List Patient Active Problem List   Diagnosis Date Noted  . Primary osteoarthritis of right knee 05/26/2015    Walter Hodges 07/29/2015, 9:25 AM  [facility] MAIN [facility] SERVICES [address] [place], [place], [address] Phone: [phone]   Fax:  [phone]  Name: Walter Hodges MRN: [id number] Date of  Birth: 08/25/1956

## 2015-08-05 ENCOUNTER — Ambulatory Visit: Payer: Self-pay | Admitting: Physical Therapy

## 2015-08-05 ENCOUNTER — Encounter: Payer: Self-pay | Admitting: Physical Therapy

## 2015-08-05 DIAGNOSIS — M25561 Pain in right knee: Secondary | ICD-10-CM

## 2015-08-05 DIAGNOSIS — M6281 Muscle weakness (generalized): Secondary | ICD-10-CM

## 2015-08-05 DIAGNOSIS — R262 Difficulty in walking, not elsewhere classified: Secondary | ICD-10-CM

## 2015-08-05 NOTE — Therapy (Signed)
[place] [facility] MAIN [facility] SERVICES [address] [place], [place], [address] Phone: [phone]   Fax:  [phone]  Physical Therapy Treatment  Patient Details  Name: Walter Hodges MRN: [id number] Date of Birth: 02-Apr-1956 No Data Recorded  Encounter Date: 08/05/2015      PT End of Session - 08/05/15 0930    Visit Number 11   Number of Visits 17   Date for PT Re-Evaluation 08/12/15   Authorization Type 11   PT Start Time 0830   PT Stop Time 0915   PT Time Calculation (min) 45 min      Past Medical History  Diagnosis Date  . Bell's palsy 2010    lasted one day ago, told that he had a ministroke  . Hypertension     followed by Dr[facility] Nita Sells" with [facility]    . Arthritis     Past Surgical History  Procedure Laterality Date  . Circumcision      done as a child- given inhalation   . 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  Visit Diagnosis:  Right knee pain  Difficulty walking  Muscle weakness      Subjective Assessment - 08/05/15 0929    Subjective Muscle fatigue but no major pain complaints   Currently in Pain? Yes   Pain Score 4    Pain Location Knee        Therapeutic exercise; Leg press x 20 x 3 130 lbs, heel raises with 90 lbs x 20 x 3 Heel raises standing  4 way hip RTB x 20 Leg press with 120 lbs x 20 Knee flex stretch 30 sec x 3, knee extension stretch 30 sec x 3 Prone knee flex x 5 reps for 10 seconds Patient is having less pain during and after therapy.                           PT Education - 08/05/15 0929    Education provided Yes   Education Details HEP   Person(s) Educated Patient   Methods Explanation   Comprehension Verbalized understanding             PT Long Term Goals - 06/17/15  1102    PT LONG TERM GOAL #1   Title Patient will be independent in home exercise program to improve strength/mobility for better functional independence with ADLs  08/12/15   PT LONG TERM GOAL #2   Title Patient (> 74 years old) will complete five times sit to stand test in < 15 seconds indicating an increased LE strength and improved balance.  08/12/15   PT LONG TERM GOAL #3   Title Patient will increase six minute walk test distance to >1000 for progression to community ambulator and improve gait ability 08/12/15   PT LONG TERM GOAL #4   Title Patient will increase 10 meter walk test to >1.67m/s as to improve gait speed for better community ambulation and to reduce fall risk 08/12/15   PT LONG TERM GOAL #5   Title Patient will ascend/descend 4 stairs without rail assist independently without loss of balance to improve ability to get in/out of home. 08/12/15               Plan -  08/05/15 0931    Clinical Impression Statement PT provided min - moderate verbal instruction to improve set up, proper use of LE, and improved posture and gait mechanics. Patient responded to instruction   Pt will benefit from skilled therapeutic intervention in order to improve on the following deficits Decreased endurance;Increased edema;Decreased activity tolerance;Decreased strength;Pain;Difficulty walking;Decreased mobility;Impaired flexibility   Rehab Potential Good   PT Frequency 2x / week   PT Duration 8 weeks   PT Treatment/Interventions Passive range of motion;Scar mobilization;Manual techniques;Therapeutic exercise;Therapeutic activities;Functional mobility training;Stair training;Gait training;Electrical Stimulation;Moist Heat;Cryotherapy   PT Next Visit Plan progression of HEP   PT Home Exercise Plan hamstring stretching   Consulted and Agree with Plan of Care Patient        Problem List Patient Active Problem List   Diagnosis Date Noted  . Primary osteoarthritis of right knee 05/26/2015     [name], Kristine S 08/05/2015, 9:38 AM  [place] [facility] [address] [place], [place], [address] Phone: [phone]   Fax:  [phone]  Name: Walter Hodges MRN: [id number] Date of Birth: 11-27-1955

## 2015-08-07 ENCOUNTER — Ambulatory Visit: Payer: Self-pay | Attending: Orthopaedic Surgery | Admitting: Physical Therapy

## 2015-08-07 ENCOUNTER — Encounter: Payer: Self-pay | Admitting: Physical Therapy

## 2015-08-07 DIAGNOSIS — M25561 Pain in right knee: Secondary | ICD-10-CM | POA: Insufficient documentation

## 2015-08-07 DIAGNOSIS — R262 Difficulty in walking, not elsewhere classified: Secondary | ICD-10-CM | POA: Insufficient documentation

## 2015-08-07 DIAGNOSIS — M6281 Muscle weakness (generalized): Secondary | ICD-10-CM | POA: Insufficient documentation

## 2015-08-07 NOTE — Therapy (Signed)
[facility] MAIN [facility] SERVICES [address] [place], [place], [address] Phone: [phone]   Fax:  [phone]  Physical Therapy Treatment  Patient Details  Name: Walter Hodges MRN: [id number] Date of Birth: 23-Jun-1956 No Data Recorded  Encounter Date: 08/07/2015      PT End of Session - 08/07/15 0957    Visit Number 12   Number of Visits 17   Date for PT Re-Evaluation 08/12/15   Authorization Type 11   PT Start Time 0915   PT Stop Time 1000   PT Time Calculation (min) 45 min      Past Medical History  Diagnosis Date  . Bell's palsy 2010    lasted one day ago, told that he had a ministroke  . Hypertension     followed by Dr[facility] Nita Sells" with [facility]    . Arthritis     Past Surgical History  Procedure Laterality Date  . Circumcision      done as a child- given inhalation   . 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  Visit Diagnosis:  Right knee pain  Difficulty walking  Muscle weakness      Subjective Assessment - 08/07/15 0956    Subjective Patient is doing better and is having less pain.    Currently in Pain? Yes   Pain Score 4          Therapeutic exercise; Cross trainer  x 15 Knee flex stretch x 30 sec, knee extension x 30 sec x 3 Prone knee flex x 30 sec, x 10 repetitions Knee extension stretch 30 sec  CGA and Min to mod verbal cues used throughout exercises.  Right Knee ROM 0-115 degrees.                          PT Education - 08/07/15 0956    Education provided Yes   Education Details HEP   Person(s) Educated Patient   Methods Explanation   Comprehension Verbalized understanding             PT Long Term Goals - 06/17/15 1102    PT LONG TERM GOAL #1   Title Patient will be independent  in home exercise program to improve strength/mobility for better functional independence with ADLs  08/12/15   PT LONG TERM GOAL #2   Title Patient (> 57 years old) will complete five times sit to stand test in < 15 seconds indicating an increased LE strength and improved balance.  08/12/15   PT LONG TERM GOAL #3   Title Patient will increase six minute walk test distance to >1000 for progression to community ambulator and improve gait ability 08/12/15   PT LONG TERM GOAL #4   Title Patient will increase 10 meter walk test to >1.51m/s as to improve gait speed for better community ambulation and to reduce fall risk 08/12/15   PT LONG TERM GOAL #5   Title Patient will ascend/descend 4 stairs without rail assist independently without loss of balance to improve ability to get in/out of home. 08/12/15               Plan - 08/07/15 0957    Clinical Impression Statement PT provided min - moderate verbal instruction to improve set up,  proper use of LE, and improved posture and gait mechanics. Patient responded moderately to instruction   Pt will benefit from skilled therapeutic intervention in order to improve on the following deficits Decreased endurance;Increased edema;Decreased activity tolerance;Decreased strength;Pain;Difficulty walking;Decreased mobility;Impaired flexibility   Rehab Potential Good   PT Frequency 2x / week   PT Duration 8 weeks   PT Treatment/Interventions Passive range of motion;Scar mobilization;Manual techniques;Therapeutic exercise;Therapeutic activities;Functional mobility training;Stair training;Gait training;Electrical Stimulation;Moist Heat;Cryotherapy   PT Next Visit Plan progression of HEP   PT Home Exercise Plan hamstring stretching   Consulted and Agree with Plan of Care Patient        Problem List Patient Active Problem List   Diagnosis Date Noted  . Primary osteoarthritis of right knee 05/26/2015    [name] 08/07/2015, 9:58 AM  [facility] MAIN [facility] SERVICES [address] [place], [place], [address] Phone: [phone]   Fax:  [phone]  Name: DEKEL WAITERS MRN: [id number] Date of Birth: Nov 19, 1955

## 2015-08-11 ENCOUNTER — Ambulatory Visit: Payer: Self-pay | Admitting: Physical Therapy

## 2015-08-13 ENCOUNTER — Encounter: Payer: Self-pay | Admitting: Physical Therapy

## 2015-08-13 ENCOUNTER — Ambulatory Visit: Payer: Self-pay | Admitting: Physical Therapy

## 2015-08-13 DIAGNOSIS — M6281 Muscle weakness (generalized): Secondary | ICD-10-CM

## 2015-08-13 DIAGNOSIS — M25561 Pain in right knee: Secondary | ICD-10-CM

## 2015-08-13 DIAGNOSIS — R262 Difficulty in walking, not elsewhere classified: Secondary | ICD-10-CM

## 2015-08-13 NOTE — Therapy (Signed)
[facility] MAIN [facility] SERVICES [address] [place], [place], [address] Phone: [phone]   Fax:  [phone]  Physical Therapy Treatment  Patient Details  Name: Walter Hodges MRN: [id number] Date of Birth: Jul 29, 1956 No Data Recorded  Encounter Date: 08/13/2015      PT End of Session - 08/13/15 1516    Visit Number (p) 13   Number of Visits (p) 17   Date for PT Re-Evaluation (p) 08/12/15   Authorization Type (p) 13      Past Medical History  Diagnosis Date  . Bell's palsy 2010    lasted one day ago, told that he had a ministroke  . Hypertension     followed by Dr[facility] Nita Sells" with [facility]    . Arthritis     Past Surgical History  Procedure Laterality Date  . Circumcision      done as a child- given inhalation   . 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  Visit Diagnosis:  Right knee pain  Difficulty walking  Muscle weakness   Patient is having pain in BLE knees today 8/10 .  Cross trainer x 15 minutes with cuing for posture and speed.  Patient has decreased pain after treatment and continues to have swelling and pain.                             PT Education - 08/13/15 1515    Education provided Yes   Education Details HEP   Person(s) Educated Patient   Methods Explanation   Comprehension Verbalized understanding             PT Long Term Goals - 06/17/15 1102    PT LONG TERM GOAL #1   Title Patient will be independent in home exercise program to improve strength/mobility for better functional independence with ADLs  08/12/15   PT LONG TERM GOAL #2   Title Patient (> 28 years old) will complete five times sit to stand test in < 15 seconds indicating an increased LE strength and improved balance.  08/12/15    PT LONG TERM GOAL #3   Title Patient will increase six minute walk test distance to >1000 for progression to community ambulator and improve gait ability 08/12/15   PT LONG TERM GOAL #4   Title Patient will increase 10 meter walk test to >1.24m/s as to improve gait speed for better community ambulation and to reduce fall risk 08/12/15   PT LONG TERM GOAL #5   Title Patient will ascend/descend 4 stairs without rail assist independently without loss of balance to improve ability to get in/out of home. 08/12/15               Plan - 08/13/15 1542    Clinical Impression Statement Patient is having a lot of pain in both knee today 8/10 . He also had a a bad day yesterday.    Pt will benefit from skilled therapeutic intervention in order to improve on the following deficits Decreased endurance;Increased edema;Decreased activity tolerance;Decreased strength;Pain;Difficulty walking;Decreased mobility;Impaired flexibility   Rehab Potential Good   PT Frequency 2x / week   PT Duration 8 weeks   PT Treatment/Interventions Passive range of motion;Scar mobilization;Manual techniques;Therapeutic exercise;Therapeutic activities;Functional mobility training;Stair training;Gait  training;Electrical Stimulation;Moist Heat;Cryotherapy   PT Next Visit Plan progression of HEP   PT Home Exercise Plan hamstring stretching   Consulted and Agree with Plan of Care Patient        Problem List Patient Active Problem List   Diagnosis Date Noted  . Primary osteoarthritis of right knee 05/26/2015    [name] 08/13/2015, 3:50 PM  [place] [facility] MAIN [facility] SERVICES [address] [place], [place], [address] Phone: [phone]   Fax:  [phone]  Name: Walter Hodges MRN: [id number] Date of Birth: [birth year]

## 2015-10-18 IMAGING — CR DG KNEE 1-2V PORT*R*
2 series · 2 of 2 positions shown · non-contrast
Comparison: None.

CLINICAL DATA: Right total knee replacement.

EXAM:
PORTABLE RIGHT KNEE - 1-2 VIEW

[AP]
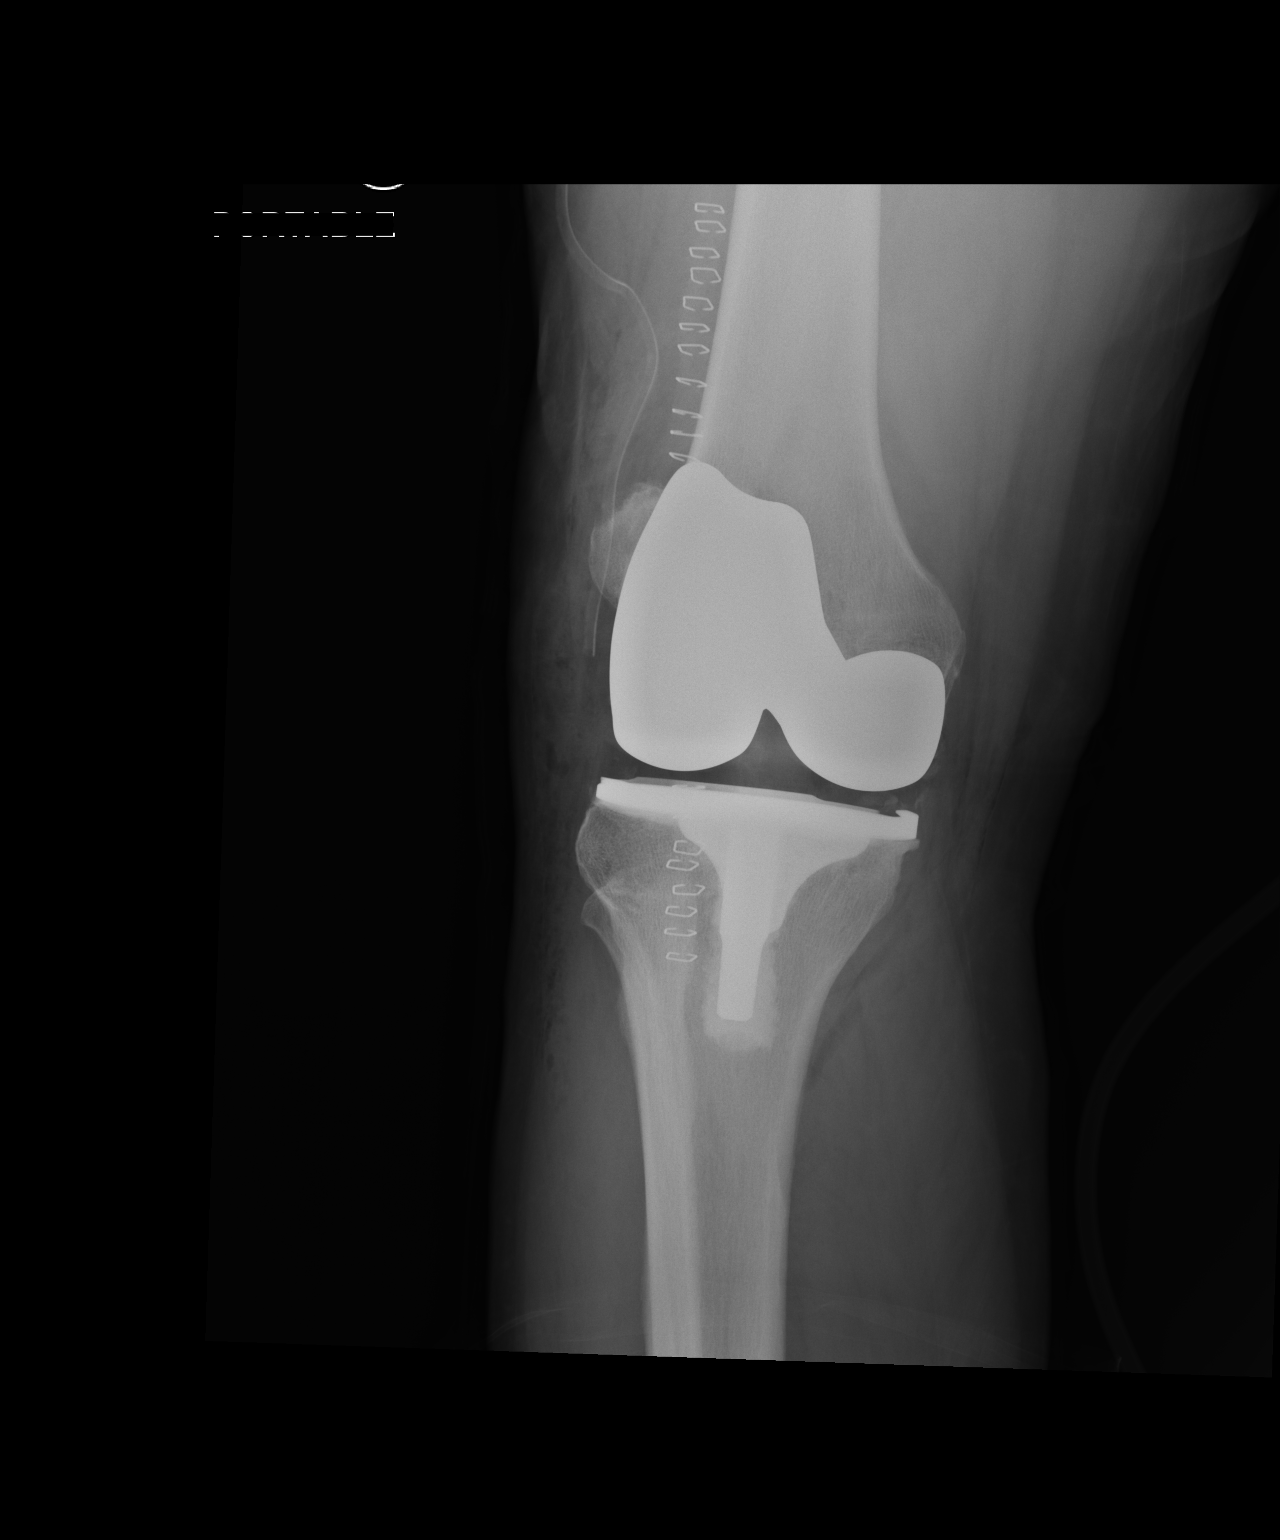

[xtable lateral]
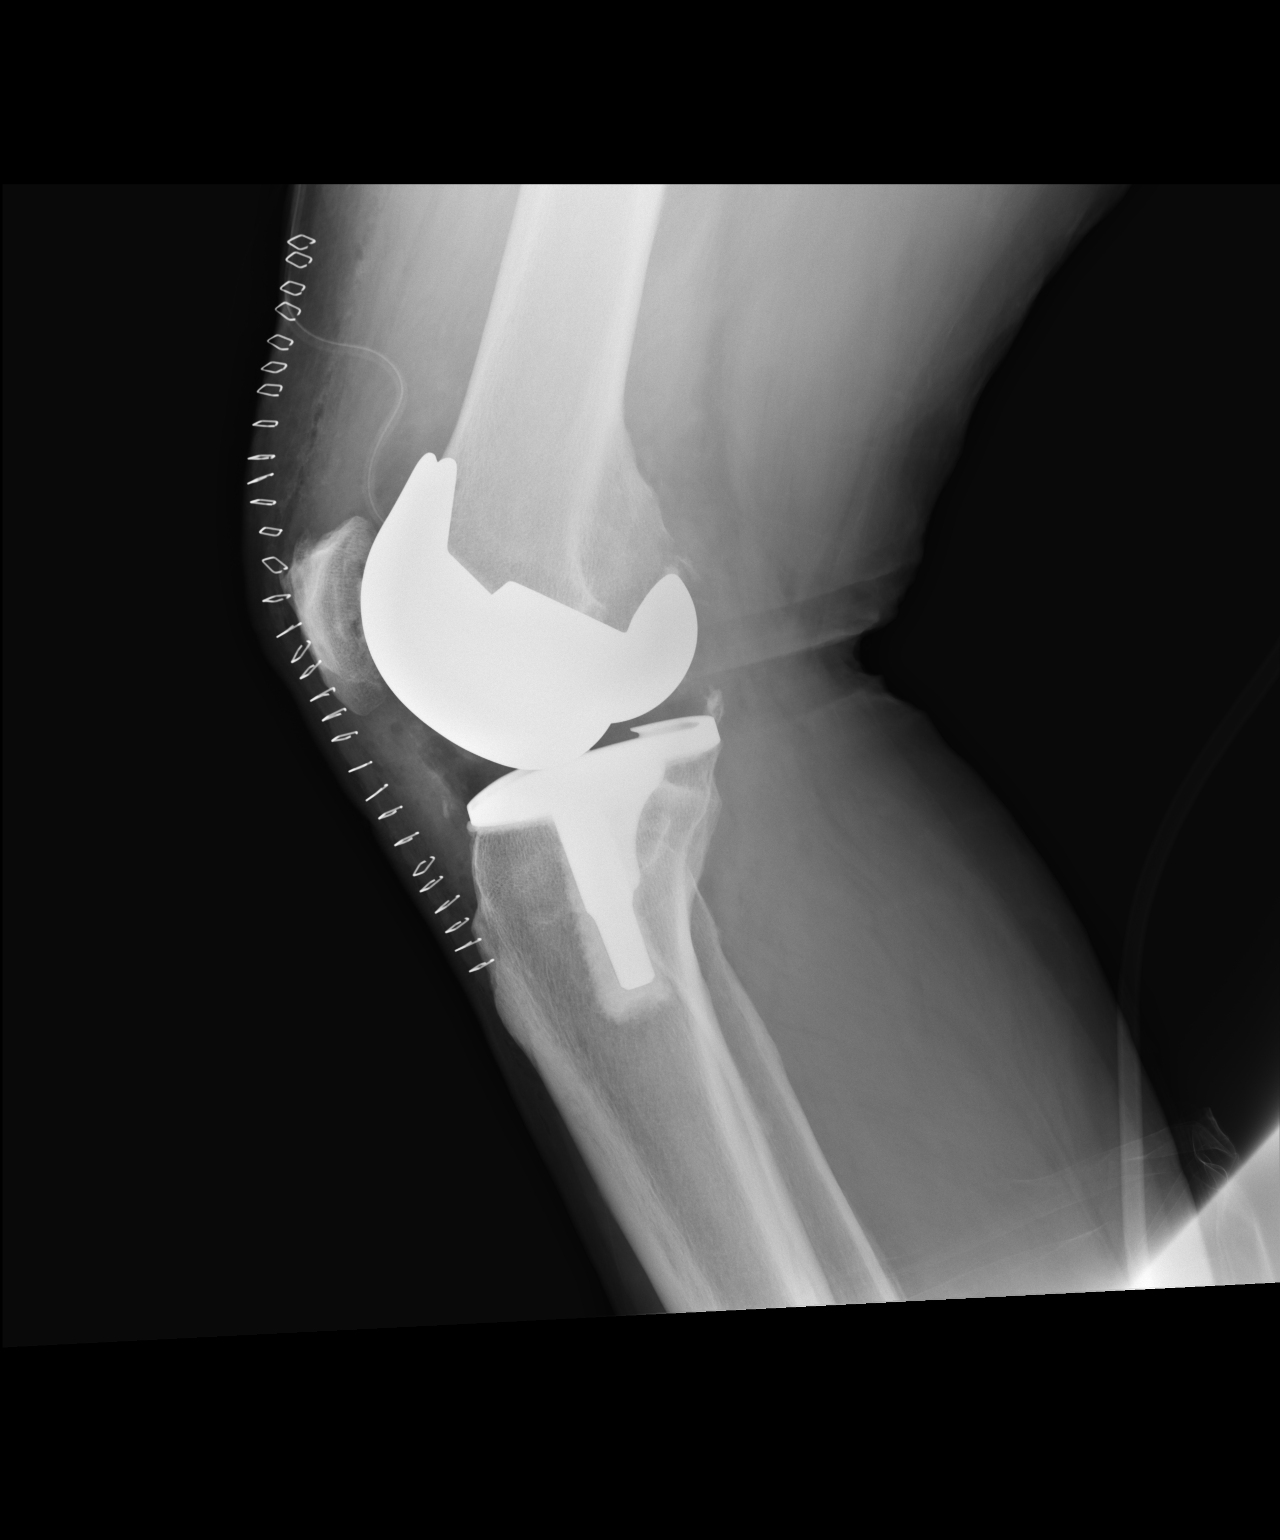

[2 of 2 positions shown; findings below may reference images not displayed]

FINDINGS: Examination demonstrates evidence of patient's recent right total
knee arthroplasty with prosthetic components intact and normally
located. Skin staples of present vertically over the anterior soft
tissues. A surgical drain is present with tip over the lateral soft
tissues. No acute complicating features.
IMPRESSION: Evidence of recent right total knee arthroplasty intact and normally
located. Surgical drain in place.

## 2015-12-18 ENCOUNTER — Other Ambulatory Visit: Payer: Self-pay | Admitting: Orthopaedic Surgery

## 2015-12-26 NOTE — Pre-Procedure Instructions (Signed)
Walter Hodges  12/26/2015      [facility] - [place], [place] - [address] [facility] [place] [address] Phone: [phone] Fax: [phone]    Your procedure is scheduled on April 27.  Report to [facility] Admitting at [phone].  Call this number if you have problems the morning of surgery:  [phone]   Remember:  Do not eat food or drink liquids after midnight.  Take these medicines the morning of surgery with A SIP OF WATER Oxycodone (Oxy IR) if needed, Oxycodone (Oxycontin)   Stop taking aspirin, Ibuprofen, Advil, Motrin, Herbal medications, Fish Oil, Aleve, BC's, Goody's   Do not wear jewelry, make-up or nail polish.  Do not wear lotions, powders, or perfumes.  You may wear deodorant.  Do not shave 48 hours prior to surgery.  Men may shave face and neck.  Do not bring valuables to the [facility].  [facility] is not responsible for any belongings or valuables.  Contacts, dentures or bridgework may not be worn into surgery.  Leave your suitcase in the car.  After surgery it may be brought to your room.  For patients admitted to the [facility], discharge time will be determined by your treatment team.  Patients discharged the day of surgery will not be allowed to drive home.    Special instructions:  [facility] - Preparing for Surgery  Before surgery, you can play an important role.  Because skin is not sterile, your skin needs to be as free of germs as possible.  You can reduce the number of germs on you skin by washing with CHG (chlorahexidine gluconate) soap before surgery.  CHG is an antiseptic cleaner which kills germs and bonds with the skin to continue killing germs even after washing.  Please DO NOT use if you have an allergy to CHG or antibacterial soaps.  If your skin becomes reddened/irritated stop using the CHG and inform your nurse when you arrive at Short Stay.  Do not shave (including legs and  underarms) for at least 48 hours prior to the first CHG shower.  You may shave your face.  Please follow these instructions carefully:   1.  Shower with CHG Soap the night before surgery and the  morning of Surgery.  2.  If you choose to wash your hair, wash your hair first as usual with your  normal shampoo.  3.  After you shampoo, rinse your hair and body thoroughly to remove the Shampoo.  4.  Use CHG as you would any other liquid soap.  You can apply chg directly       to the skin and wash gently with scrungie or a clean washcloth.  5.  Apply the CHG Soap to your body ONLY FROM THE NECK DOWN.        Do not use on open wounds or open sores.  Avoid contact with your eyes,       ears, mouth and genitals (private parts).  Wash genitals (private parts)       with your normal soap.  6.  Wash thoroughly, paying special attention to the area where your surgery        will be performed.  7.  Thoroughly rinse your body with warm water from the neck down.  8.  DO NOT shower/wash with your normal soap after using and rinsing off       the CHG Soap.  9.  Walter Bible  yourself dry with a clean towel.            10.  Wear clean pajamas.            11.  Place clean sheets on your bed the night of your first shower and do not        sleep with pets.  Day of Surgery  Do not apply any lotions/deoderants the morning of surgery.  Please wear clean clothes to the [facility]/surgery center.     Please read over the following fact sheets that you were given. Pain Booklet, Coughing and Deep Breathing, Blood Transfusion Information, MRSA Information and Surgical Site Infection Prevention

## 2015-12-29 ENCOUNTER — Encounter (HOSPITAL_COMMUNITY)
Admission: RE | Admit: 2015-12-29 | Discharge: 2015-12-29 | Disposition: A | Discharge status: Home / Self Care | Payer: Medicaid Other | Source: Ambulatory Visit | Attending: Orthopaedic Surgery | Admitting: Orthopaedic Surgery

## 2015-12-29 ENCOUNTER — Encounter (HOSPITAL_COMMUNITY): Payer: Self-pay

## 2015-12-29 DIAGNOSIS — Z01812 Encounter for preprocedural laboratory examination: Secondary | ICD-10-CM | POA: Diagnosis not present

## 2015-12-29 DIAGNOSIS — Z0183 Encounter for blood typing: Secondary | ICD-10-CM | POA: Insufficient documentation

## 2015-12-29 DIAGNOSIS — M1712 Unilateral primary osteoarthritis, left knee: Secondary | ICD-10-CM | POA: Diagnosis not present

## 2015-12-29 HISTORY — DX: Headache, unspecified: R51.9

## 2015-12-29 HISTORY — DX: Headache: R51

## 2015-12-29 LAB — COMPREHENSIVE METABOLIC PANEL
ALBUMIN: 3.8 g/dL (ref 3.5–5.0)
ALK PHOS: 78 U/L (ref 38–126)
ALT: 16 U/L — ABNORMAL LOW (ref 17–63)
ANION GAP: 6 (ref 5–15)
AST: 20 U/L (ref 15–41)
BILIRUBIN TOTAL: 0.5 mg/dL (ref 0.3–1.2)
BUN: 12 mg/dL (ref 6–20)
CALCIUM: 9.3 mg/dL (ref 8.9–10.3)
CO2: 31 mmol/L (ref 22–32)
Chloride: 104 mmol/L (ref 101–111)
Creatinine, Ser: 0.85 mg/dL (ref 0.61–1.24)
GFR calc non Af Amer: >60 mL/min (ref >60)
GLUCOSE: 105 mg/dL — AB (ref 65–99)
POTASSIUM: 3.8 mmol/L (ref 3.5–5.1)
SODIUM: 141 mmol/L (ref 135–145)
TOTAL PROTEIN: 6.7 g/dL (ref 6.5–8.1)

## 2015-12-29 LAB — URINALYSIS, ROUTINE W REFLEX MICROSCOPIC
BILIRUBIN URINE: NEGATIVE
GLUCOSE, UA: NEGATIVE mg/dL
HGB URINE DIPSTICK: NEGATIVE
Ketones, ur: NEGATIVE mg/dL
Leukocytes, UA: NEGATIVE
Nitrite: NEGATIVE
PH: 6.5 (ref 5.0–8.0)
Protein, ur: NEGATIVE mg/dL
SPECIFIC GRAVITY, URINE: 1.019 (ref 1.005–1.030)

## 2015-12-29 LAB — TYPE AND SCREEN
ABO/RH(D): A POS
ANTIBODY SCREEN: NEGATIVE

## 2015-12-29 LAB — CBC WITH DIFFERENTIAL/PLATELET
BASOS PCT: 0 %
Basophils Absolute: 0 10*3/uL (ref 0.0–0.1)
EOS ABS: 0.1 10*3/uL (ref 0.0–0.7)
Eosinophils Relative: 1 %
HEMATOCRIT: 38.9 % — AB (ref 39.0–52.0)
HEMOGLOBIN: 12.9 g/dL — AB (ref 13.0–17.0)
Lymphocytes Relative: 33 %
Lymphs Abs: 1.8 10*3/uL (ref 0.7–4.0)
MCH: 27.7 pg (ref 26.0–34.0)
MCHC: 33.2 g/dL (ref 30.0–36.0)
MCV: 83.5 fL (ref 78.0–100.0)
MONO ABS: 0.6 10*3/uL (ref 0.1–1.0)
MONOS PCT: 10 %
Neutro Abs: 3 10*3/uL (ref 1.7–7.7)
Neutrophils Relative %: 56 %
Platelets: 260 10*3/uL (ref 150–400)
RBC: 4.66 MIL/uL (ref 4.22–5.81)
RDW: 14.3 % (ref 11.5–15.5)
WBC: 5.4 10*3/uL (ref 4.0–10.5)

## 2015-12-29 LAB — PROTIME-INR
INR: 1.07 (ref 0.00–1.49)
Prothrombin Time: 14.1 seconds (ref 11.6–15.2)

## 2015-12-29 LAB — C-REACTIVE PROTEIN: CRP: 0.5 mg/dL (ref <1.0)

## 2015-12-29 LAB — APTT: aPTT: 30 seconds (ref 24–37)

## 2015-12-29 LAB — SURGICAL PCR SCREEN
MRSA, PCR: NEGATIVE
Staphylococcus aureus: NEGATIVE

## 2015-12-29 LAB — SEDIMENTATION RATE: SED RATE: 3 mm/h (ref 0–16)

## 2015-12-29 NOTE — Progress Notes (Signed)
   12/29/15 0927  OBSTRUCTIVE SLEEP APNEA  Have you ever been diagnosed with sleep apnea through a sleep study? No  Do you snore loudly (loud enough to be heard through closed doors)?  0  Do you often feel tired, fatigued, or sleepy during the daytime (such as falling asleep during driving or talking to someone)? 1  Has anyone observed you stop breathing during your sleep? 0  Do you have, or are you being treated for high blood pressure? 1  BMI more than 35 kg/m2? 1  Age > 50 (1-yes) 1  Neck circumference greater than:Male 16 inches or larger, Male 17inches or larger? 0  Male Gender (Yes=1) 1  Obstructive Sleep Apnea Score 5  Score 5 or greater  Results sent to PCP

## 2015-12-29 NOTE — Progress Notes (Signed)
PCP is Walter SharpsMary Ann Placey, NP Denies seeing a cardiologist. Denies ever having a stress test, echo, or card cath.

## 2015-12-31 MED ORDER — CEFAZOLIN SODIUM-DEXTROSE 2-4 GM/100ML-% IV SOLN
2.0000 g | INTRAVENOUS | Status: AC
  Administered 2016-01-01: 2 g via INTRAVENOUS
  Filled 2015-12-31: qty 100

## 2015-12-31 MED ORDER — BUPIVACAINE LIPOSOME 1.3 % IJ SUSP
20.0000 mL | INTRAMUSCULAR | Status: AC
  Administered 2016-01-01: 20 mL
  Filled 2015-12-31: qty 20

## 2015-12-31 MED ORDER — TRANEXAMIC ACID 1000 MG/10ML IV SOLN
1000.0000 mg | INTRAVENOUS | Status: AC
  Administered 2016-01-01: 1000 mg via INTRAVENOUS
  Filled 2015-12-31: qty 10

## 2016-01-01 ENCOUNTER — Inpatient Hospital Stay (HOSPITAL_COMMUNITY): Payer: Medicaid Other | Admitting: Certified Registered Nurse Anesthetist

## 2016-01-01 ENCOUNTER — Encounter (HOSPITAL_COMMUNITY): Payer: Self-pay | Admitting: Surgery

## 2016-01-01 ENCOUNTER — Inpatient Hospital Stay (HOSPITAL_COMMUNITY)
Admission: RE | Admit: 2016-01-01 | Discharge: 2016-01-03 | DRG: 470 | Disposition: A | Discharge status: Home / Self Care | Payer: Medicaid Other | Source: Ambulatory Visit | Attending: Orthopaedic Surgery | Admitting: Orthopaedic Surgery

## 2016-01-01 ENCOUNTER — Encounter (HOSPITAL_COMMUNITY)
Admission: RE | Disposition: A | Discharge status: Home / Self Care | Payer: Self-pay | Source: Ambulatory Visit | Attending: Orthopaedic Surgery

## 2016-01-01 ENCOUNTER — Inpatient Hospital Stay (HOSPITAL_COMMUNITY): Payer: Medicaid Other

## 2016-01-01 DIAGNOSIS — I10 Essential (primary) hypertension: Secondary | ICD-10-CM | POA: Diagnosis present

## 2016-01-01 DIAGNOSIS — Z96651 Presence of right artificial knee joint: Secondary | ICD-10-CM | POA: Diagnosis present

## 2016-01-01 DIAGNOSIS — Z96659 Presence of unspecified artificial knee joint: Secondary | ICD-10-CM

## 2016-01-01 DIAGNOSIS — D62 Acute posthemorrhagic anemia: Secondary | ICD-10-CM | POA: Diagnosis not present

## 2016-01-01 DIAGNOSIS — M1712 Unilateral primary osteoarthritis, left knee: Secondary | ICD-10-CM | POA: Diagnosis present

## 2016-01-01 HISTORY — PX: TOTAL KNEE ARTHROPLASTY: SHX125

## 2016-01-01 SURGERY — ARTHROPLASTY, KNEE, TOTAL
Anesthesia: Spinal | Site: Knee | Laterality: Left

## 2016-01-01 MED ORDER — POLYETHYLENE GLYCOL 3350 17 G PO PACK: 17.0000 g | PACK | Freq: Every day | ORAL | Status: DC | PRN

## 2016-01-01 MED ORDER — MIDAZOLAM HCL 2 MG/2ML IJ SOLN
INTRAMUSCULAR | Status: AC
  Filled 2016-01-01: qty 2

## 2016-01-01 MED ORDER — METOCLOPRAMIDE HCL 5 MG/ML IJ SOLN: 5.0000 mg | Freq: Three times a day (TID) | INTRAMUSCULAR | Status: DC | PRN

## 2016-01-01 MED ORDER — ONDANSETRON HCL 4 MG PO TABS: 4.0000 mg | ORAL_TABLET | Freq: Four times a day (QID) | ORAL | Status: DC | PRN

## 2016-01-01 MED ORDER — MORPHINE SULFATE (PF) 2 MG/ML IV SOLN
1.0000 mg | INTRAVENOUS | Status: DC | PRN
  Administered 2016-01-01 – 2016-01-03 (×10): 1 mg via INTRAVENOUS
  Filled 2016-01-01 (×10): qty 1

## 2016-01-01 MED ORDER — OXYCODONE HCL 5 MG PO TABS: 5.0000 mg | ORAL_TABLET | ORAL | Status: DC | PRN

## 2016-01-01 MED ORDER — BUPIVACAINE LIPOSOME 1.3 % IJ SUSP
20.0000 mL | INTRAMUSCULAR | Status: DC
  Filled 2016-01-01: qty 20

## 2016-01-01 MED ORDER — SODIUM CHLORIDE 0.9 % IV SOLN
INTRAVENOUS | Status: DC
  Administered 2016-01-01: via INTRAVENOUS

## 2016-01-01 MED ORDER — ZOLPIDEM TARTRATE 5 MG PO TABS
10.0000 mg | ORAL_TABLET | Freq: Every day | ORAL | Status: DC
  Administered 2016-01-01 – 2016-01-02 (×2): 10 mg via ORAL
  Filled 2016-01-01 (×3): qty 2

## 2016-01-01 MED ORDER — PHENYLEPHRINE 40 MCG/ML (10ML) SYRINGE FOR IV PUSH (FOR BLOOD PRESSURE SUPPORT)
PREFILLED_SYRINGE | INTRAVENOUS | Status: AC
  Filled 2016-01-01: qty 10

## 2016-01-01 MED ORDER — OXYCODONE HCL 5 MG PO TABS
ORAL_TABLET | ORAL | Status: AC
  Filled 2016-01-01: qty 2

## 2016-01-01 MED ORDER — CHLORHEXIDINE GLUCONATE 4 % EX LIQD: 60.0000 mL | Freq: Once | CUTANEOUS | Status: DC

## 2016-01-01 MED ORDER — OXYCODONE HCL 5 MG/5ML PO SOLN: 5.0000 mg | Freq: Once | ORAL | Status: DC | PRN

## 2016-01-01 MED ORDER — DIPHENHYDRAMINE HCL 12.5 MG/5ML PO ELIX: 25.0000 mg | ORAL_SOLUTION | ORAL | Status: DC | PRN

## 2016-01-01 MED ORDER — ACETAMINOPHEN 325 MG PO TABS: 325.0000 mg | ORAL_TABLET | ORAL | Status: DC | PRN

## 2016-01-01 MED ORDER — OXYCODONE HCL 5 MG PO TABS
5.0000 mg | ORAL_TABLET | ORAL | Status: DC | PRN
Start: 2016-01-01 — End: 2016-01-03
  Administered 2016-01-01: 10 mg via ORAL
  Administered 2016-01-01: 15 mg via ORAL
  Filled 2016-01-01 (×2): qty 3

## 2016-01-01 MED ORDER — MENTHOL 3 MG MT LOZG: 1.0000 | LOZENGE | OROMUCOSAL | Status: DC | PRN

## 2016-01-01 MED ORDER — ROCURONIUM BROMIDE 50 MG/5ML IV SOLN
INTRAVENOUS | Status: AC
  Filled 2016-01-01: qty 1

## 2016-01-01 MED ORDER — ONDANSETRON HCL 4 MG PO TABS: 4.0000 mg | ORAL_TABLET | Freq: Three times a day (TID) | ORAL | Status: DC | PRN

## 2016-01-01 MED ORDER — ASPIRIN EC 325 MG PO TBEC
325.0000 mg | DELAYED_RELEASE_TABLET | Freq: Two times a day (BID) | ORAL | Status: DC
  Administered 2016-01-01 – 2016-01-03 (×4): 325 mg via ORAL
  Filled 2016-01-01 (×4): qty 1

## 2016-01-01 MED ORDER — KETOROLAC TROMETHAMINE 30 MG/ML IJ SOLN
30.0000 mg | Freq: Four times a day (QID) | INTRAMUSCULAR | Status: DC | PRN
Start: 2016-01-01 — End: 2016-01-03
  Administered 2016-01-01: 30 mg via INTRAVENOUS
  Filled 2016-01-01 (×2): qty 1

## 2016-01-01 MED ORDER — HYDROCHLOROTHIAZIDE 25 MG PO TABS
25.0000 mg | ORAL_TABLET | Freq: Every day | ORAL | Status: DC
  Administered 2016-01-02 – 2016-01-03 (×2): 25 mg via ORAL
  Filled 2016-01-01 (×2): qty 1

## 2016-01-01 MED ORDER — LACTATED RINGERS IV SOLN
INTRAVENOUS | Status: DC | PRN
  Administered 2016-01-01 (×2): via INTRAVENOUS

## 2016-01-01 MED ORDER — ACETAMINOPHEN 650 MG RE SUPP: 650.0000 mg | Freq: Four times a day (QID) | RECTAL | Status: DC | PRN

## 2016-01-01 MED ORDER — OXYCODONE HCL 5 MG PO TABS: 5.0000 mg | ORAL_TABLET | Freq: Once | ORAL | Status: DC | PRN

## 2016-01-01 MED ORDER — DEXAMETHASONE SODIUM PHOSPHATE 10 MG/ML IJ SOLN
10.0000 mg | Freq: Once | INTRAMUSCULAR | Status: AC
  Administered 2016-01-02: 10 mg via INTRAVENOUS
  Filled 2016-01-01: qty 1

## 2016-01-01 MED ORDER — SODIUM CHLORIDE 0.9 % IR SOLN
Status: DC | PRN
  Administered 2016-01-01: 1000 mL
  Administered 2016-01-01: 3000 mL

## 2016-01-01 MED ORDER — MAGNESIUM CITRATE PO SOLN: 1.0000 | Freq: Once | ORAL | Status: DC | PRN

## 2016-01-01 MED ORDER — ACETAMINOPHEN 160 MG/5ML PO SOLN
325.0000 mg | ORAL | Status: DC | PRN
  Filled 2016-01-01: qty 20.3

## 2016-01-01 MED ORDER — OXYCODONE HCL ER 10 MG PO T12A
10.0000 mg | EXTENDED_RELEASE_TABLET | Freq: Two times a day (BID) | ORAL | Status: DC
  Administered 2016-01-02: 10 mg via ORAL
  Filled 2016-01-01 (×2): qty 1

## 2016-01-01 MED ORDER — METHOCARBAMOL 1000 MG/10ML IJ SOLN
500.0000 mg | Freq: Four times a day (QID) | INTRAVENOUS | Status: DC | PRN
  Filled 2016-01-01: qty 5

## 2016-01-01 MED ORDER — LACTATED RINGERS IV SOLN: INTRAVENOUS | Status: DC

## 2016-01-01 MED ORDER — ALUM & MAG HYDROXIDE-SIMETH 200-200-20 MG/5ML PO SUSP: 30.0000 mL | ORAL | Status: DC | PRN

## 2016-01-01 MED ORDER — FENTANYL CITRATE (PF) 100 MCG/2ML IJ SOLN
INTRAMUSCULAR | Status: DC | PRN
  Administered 2016-01-01 (×3): 50 ug via INTRAVENOUS

## 2016-01-01 MED ORDER — PHENYLEPHRINE HCL 10 MG/ML IJ SOLN
INTRAMUSCULAR | Status: DC | PRN
  Administered 2016-01-01: 40 ug via INTRAVENOUS
  Administered 2016-01-01 (×2): 80 ug via INTRAVENOUS
  Administered 2016-01-01: 40 ug via INTRAVENOUS

## 2016-01-01 MED ORDER — OXYCODONE HCL ER 10 MG PO T12A: 10.0000 mg | EXTENDED_RELEASE_TABLET | Freq: Two times a day (BID) | ORAL | Status: DC

## 2016-01-01 MED ORDER — FENTANYL CITRATE (PF) 250 MCG/5ML IJ SOLN
INTRAMUSCULAR | Status: AC
  Filled 2016-01-01: qty 5

## 2016-01-01 MED ORDER — 0.9 % SODIUM CHLORIDE (POUR BTL) OPTIME
TOPICAL | Status: DC | PRN
  Administered 2016-01-01: 1000 mL

## 2016-01-01 MED ORDER — LISINOPRIL 40 MG PO TABS
40.0000 mg | ORAL_TABLET | Freq: Every day | ORAL | Status: DC
Start: 2016-01-02 — End: 2016-01-03
  Administered 2016-01-02 – 2016-01-03 (×2): 40 mg via ORAL
  Filled 2016-01-01 (×2): qty 1

## 2016-01-01 MED ORDER — KETOROLAC TROMETHAMINE 30 MG/ML IJ SOLN
INTRAMUSCULAR | Status: AC
  Filled 2016-01-01: qty 1

## 2016-01-01 MED ORDER — ONDANSETRON HCL 4 MG/2ML IJ SOLN: 4.0000 mg | Freq: Four times a day (QID) | INTRAMUSCULAR | Status: DC | PRN

## 2016-01-01 MED ORDER — SORBITOL 70 % SOLN: 30.0000 mL | Freq: Every day | Status: DC | PRN

## 2016-01-01 MED ORDER — MIDAZOLAM HCL 5 MG/5ML IJ SOLN
INTRAMUSCULAR | Status: DC | PRN
  Administered 2016-01-01: 2 mg via INTRAVENOUS

## 2016-01-01 MED ORDER — TRANEXAMIC ACID 1000 MG/10ML IV SOLN
1000.0000 mg | Freq: Once | INTRAVENOUS | Status: AC
  Administered 2016-01-01: 1000 mg via INTRAVENOUS
  Filled 2016-01-01: qty 10

## 2016-01-01 MED ORDER — ACETAMINOPHEN 325 MG PO TABS
650.0000 mg | ORAL_TABLET | Freq: Four times a day (QID) | ORAL | Status: DC | PRN
  Administered 2016-01-02 – 2016-01-03 (×2): 650 mg via ORAL
  Filled 2016-01-01 (×2): qty 2

## 2016-01-01 MED ORDER — CEFAZOLIN SODIUM-DEXTROSE 2-4 GM/100ML-% IV SOLN
2.0000 g | Freq: Four times a day (QID) | INTRAVENOUS | Status: AC
  Administered 2016-01-01 (×2): 2 g via INTRAVENOUS
  Filled 2016-01-01 (×2): qty 100

## 2016-01-01 MED ORDER — PROPOFOL 500 MG/50ML IV EMUL
INTRAVENOUS | Status: DC | PRN
  Administered 2016-01-01: 75 ug/kg/min via INTRAVENOUS

## 2016-01-01 MED ORDER — METHOCARBAMOL 500 MG PO TABS
ORAL_TABLET | ORAL | Status: AC
  Filled 2016-01-01: qty 1

## 2016-01-01 MED ORDER — SENNOSIDES-DOCUSATE SODIUM 8.6-50 MG PO TABS: 1.0000 | ORAL_TABLET | Freq: Every evening | ORAL | Status: DC | PRN

## 2016-01-01 MED ORDER — SODIUM CHLORIDE 0.9 % IJ SOLN
INTRAMUSCULAR | Status: DC | PRN
  Administered 2016-01-01: 40 mL

## 2016-01-01 MED ORDER — ACETAMINOPHEN 500 MG PO TABS
1000.0000 mg | ORAL_TABLET | Freq: Four times a day (QID) | ORAL | Status: AC
  Administered 2016-01-01 – 2016-01-02 (×3): 1000 mg via ORAL
  Filled 2016-01-01 (×3): qty 2

## 2016-01-01 MED ORDER — METHOCARBAMOL 500 MG PO TABS
500.0000 mg | ORAL_TABLET | Freq: Four times a day (QID) | ORAL | Status: DC | PRN
  Administered 2016-01-01: 500 mg via ORAL

## 2016-01-01 MED ORDER — PHENOL 1.4 % MT LIQD: 1.0000 | OROMUCOSAL | Status: DC | PRN

## 2016-01-01 MED ORDER — SODIUM CHLORIDE 0.9 % IV SOLN
2000.0000 mg | INTRAVENOUS | Status: AC
  Administered 2016-01-01: 2000 mg via TOPICAL
  Filled 2016-01-01: qty 20

## 2016-01-01 MED ORDER — METOCLOPRAMIDE HCL 5 MG PO TABS: 5.0000 mg | ORAL_TABLET | Freq: Three times a day (TID) | ORAL | Status: DC | PRN

## 2016-01-01 MED ORDER — ONDANSETRON HCL 4 MG/2ML IJ SOLN
INTRAMUSCULAR | Status: AC
  Filled 2016-01-01: qty 2

## 2016-01-01 MED ORDER — POVIDONE-IODINE 10 % EX SOLN
CUTANEOUS | Status: DC | PRN
  Administered 2016-01-01: 1 via TOPICAL

## 2016-01-01 MED ORDER — ASPIRIN EC 325 MG PO TBEC: 325.0000 mg | DELAYED_RELEASE_TABLET | Freq: Two times a day (BID) | ORAL | Status: DC

## 2016-01-01 MED ORDER — HYDROMORPHONE HCL 1 MG/ML IJ SOLN: 0.2500 mg | INTRAMUSCULAR | Status: DC | PRN

## 2016-01-01 MED ORDER — BUPIVACAINE IN DEXTROSE 0.75-8.25 % IT SOLN
INTRATHECAL | Status: DC | PRN
  Administered 2016-01-01: 2 mL via INTRATHECAL

## 2016-01-01 MED ORDER — ONDANSETRON HCL 4 MG/2ML IJ SOLN
INTRAMUSCULAR | Status: DC | PRN
  Administered 2016-01-01: 4 mg via INTRAVENOUS

## 2016-01-01 MED ORDER — METHOCARBAMOL 750 MG PO TABS: 750.0000 mg | ORAL_TABLET | Freq: Two times a day (BID) | ORAL | Status: DC | PRN

## 2016-01-01 MED ORDER — CELECOXIB 200 MG PO CAPS
200.0000 mg | ORAL_CAPSULE | Freq: Two times a day (BID) | ORAL | Status: DC
Start: 2016-01-01 — End: 2016-01-03
  Administered 2016-01-01 – 2016-01-03 (×4): 200 mg via ORAL
  Filled 2016-01-01 (×4): qty 1

## 2016-01-01 SURGICAL SUPPLY — 64 items
ALCOHOL ISOPROPYL (RUBBING) (MISCELLANEOUS) ×3 IMPLANT
BAG DECANTER FOR FLEXI CONT (MISCELLANEOUS) ×3 IMPLANT
BANDAGE ACE 6X5 VEL STRL LF (GAUZE/BANDAGES/DRESSINGS) ×3 IMPLANT
BANDAGE ESMARK 6X9 LF (GAUZE/BANDAGES/DRESSINGS) ×1 IMPLANT
BENZOIN TINCTURE PRP APPL 2/3 (GAUZE/BANDAGES/DRESSINGS) ×3 IMPLANT
BLADE SAW SGTL 13.0X1.19X90.0M (BLADE) ×3 IMPLANT
BNDG ELASTIC 6X10 VLCR STRL LF (GAUZE/BANDAGES/DRESSINGS) ×3 IMPLANT
BNDG ESMARK 6X9 LF (GAUZE/BANDAGES/DRESSINGS) ×3
BNDG GAUZE ELAST 4 BULKY (GAUZE/BANDAGES/DRESSINGS) ×3 IMPLANT
BONE CEMENT PALACOS R-G (Orthopedic Implant) ×6 IMPLANT
BOWL SMART MIX CTS (DISPOSABLE) ×3 IMPLANT
CAP KNEE TOTAL 3 ×3 IMPLANT
CEMENT BONE PALACOS R-G (Orthopedic Implant) ×2 IMPLANT
COVER SURGICAL LIGHT HANDLE (MISCELLANEOUS) ×3 IMPLANT
CUFF TOURNIQUET SINGLE 34IN LL (TOURNIQUET CUFF) ×3 IMPLANT
CUFF TOURNIQUET SINGLE 44IN (TOURNIQUET CUFF) IMPLANT
DERMABOND ADVANCED (GAUZE/BANDAGES/DRESSINGS) ×2
DERMABOND ADVANCED .7 DNX12 (GAUZE/BANDAGES/DRESSINGS) ×1 IMPLANT
DRAPE EXTREMITY T 121X128X90 (DRAPE) ×3 IMPLANT
DRAPE ORTHO SPLIT 77X108 STRL (DRAPES) ×4
DRAPE PROXIMA HALF (DRAPES) ×3 IMPLANT
DRAPE SURG 17X11 SM STRL (DRAPES) ×6 IMPLANT
DRAPE SURG ORHT 6 SPLT 77X108 (DRAPES) ×2 IMPLANT
DRSG AQUACEL AG ADV 3.5X14 (GAUZE/BANDAGES/DRESSINGS) ×3 IMPLANT
DRSG PAD ABDOMINAL 8X10 ST (GAUZE/BANDAGES/DRESSINGS) ×3 IMPLANT
DURAPREP 26ML APPLICATOR (WOUND CARE) ×9 IMPLANT
ELECT CAUTERY BLADE 6.4 (BLADE) ×3 IMPLANT
ELECT REM PT RETURN 9FT ADLT (ELECTROSURGICAL) ×3
ELECTRODE REM PT RTRN 9FT ADLT (ELECTROSURGICAL) ×1 IMPLANT
EVACUATOR 1/8 PVC DRAIN (DRAIN) ×3 IMPLANT
FACESHIELD WRAPAROUND (MASK) ×3 IMPLANT
GAUZE XEROFORM 5X9 LF (GAUZE/BANDAGES/DRESSINGS) ×3 IMPLANT
GLOVE SURG SYN 7.5  E (GLOVE) ×4
GLOVE SURG SYN 7.5 E (GLOVE) ×2 IMPLANT
GOWN STRL REIN XL XLG (GOWN DISPOSABLE) ×9 IMPLANT
HANDPIECE INTERPULSE COAX TIP (DISPOSABLE) ×2
KIT BASIN OR (CUSTOM PROCEDURE TRAY) ×3 IMPLANT
KIT ROOM TURNOVER OR (KITS) ×3 IMPLANT
MANIFOLD NEPTUNE II (INSTRUMENTS) ×3 IMPLANT
NEEDLE SPNL 18GX3.5 QUINCKE PK (NEEDLE) ×3 IMPLANT
NS IRRIG 1000ML POUR BTL (IV SOLUTION) ×3 IMPLANT
PACK TOTAL JOINT (CUSTOM PROCEDURE TRAY) ×3 IMPLANT
PAD ARMBOARD 7.5X6 YLW CONV (MISCELLANEOUS) ×6 IMPLANT
PEN SKIN MARKING BROAD (MISCELLANEOUS) ×3 IMPLANT
SAW OSC TIP CART 19.5X105X1.3 (SAW) ×3 IMPLANT
SEALER BIPOLAR AQUA 6.0 (INSTRUMENTS) ×3 IMPLANT
SET HNDPC FAN SPRY TIP SCT (DISPOSABLE) ×1 IMPLANT
SPONGE GAUZE 4X4 12PLY STER LF (GAUZE/BANDAGES/DRESSINGS) ×3 IMPLANT
STAPLER VISISTAT 35W (STAPLE) IMPLANT
SUCTION FRAZIER HANDLE 10FR (MISCELLANEOUS)
SUCTION TUBE FRAZIER 10FR DISP (MISCELLANEOUS) IMPLANT
SUT ETHILON 2 0 FS 18 (SUTURE) ×9 IMPLANT
SUT MNCRL AB 4-0 PS2 18 (SUTURE) ×3 IMPLANT
SUT VIC AB 0 CT1 27 (SUTURE) ×4
SUT VIC AB 0 CT1 27XBRD ANBCTR (SUTURE) ×2 IMPLANT
SUT VIC AB 1 CTX 18 (SUTURE) ×6 IMPLANT
SUT VIC AB 1 CTX 27 (SUTURE) ×3 IMPLANT
SUT VIC AB 2-0 CT1 27 (SUTURE) ×10
SUT VIC AB 2-0 CT1 TAPERPNT 27 (SUTURE) ×5 IMPLANT
SYR 20CC LL (SYRINGE) ×3 IMPLANT
SYR 50ML LL SCALE MARK (SYRINGE) ×3 IMPLANT
TOWEL OR 17X24 6PK STRL BLUE (TOWEL DISPOSABLE) ×3 IMPLANT
TOWEL OR 17X26 10 PK STRL BLUE (TOWEL DISPOSABLE) ×3 IMPLANT
WATER STERILE IRR 1000ML POUR (IV SOLUTION) ×6 IMPLANT

## 2016-01-01 NOTE — Op Note (Signed)
Total Knee Arthroplasty Procedure Note Walter Hodges [id number] 01/01/2016   Preoperative diagnosis: Left knee osteoarthritis  Postoperative diagnosis:same  Operative procedure: Left total knee arthroplasty. CPT [phone]  Surgeon: N. Glee Arvin, MD  Assistants: April Green, RNFA  Anesthesia: Spinal, regional  Tourniquet time: 69 mins  Implants used: [employer] Femur: 7 PS Tibia:7 Patella: 35 mm, 9 thick Polyethylene: 10 mm  Indication: Walter Hodges is a 60 y.o. year old male with a history of knee pain. Having failed conservative management, the patient elected to proceed with a total knee arthroplasty.  We have reviewed the risk and benefits of the surgery and they elected to proceed after voicing understanding.  Procedure:  After informed consent was obtained and understanding of the risk were voiced including but not limited to bleeding, infection, damage to surrounding structures including nerves and vessels, blood clots, leg length inequality and the failure to achieve desired results, the operative extremity was marked with verbal confirmation of the patient in the holding area.   The patient was then brought to the operating room and transported to the operating room table in the supine position.  A tourniquet was applied to the operative extremity around the upper thigh. The operative limb was then prepped and draped in the usual sterile fashion and preoperative antibiotics were administered.  A time out was performed prior to the start of surgery confirming the correct extremity, preoperative antibiotic administration, as well as team members, implants and instruments available for the case. Correct surgical site was also confirmed with preoperative radiographs. The limb was then elevated for exsanguination and the tourniquet was inflated. A midline incision was made and a standard medial parapatellar approach was performed.  The patella was prepared and  sized to a 35 mm.  A cover was placed on the patella for protection from retractors.  We then turned our attention to the femur. Posterior cruciate ligament was sacrificed. Start site was drilled in the femur and the intramedullary distal femoral cutting guide was placed, set at 5 degrees valgus, taking 9 mm of distal resection. The distal cut was made. Osteophytes were then removed. Next, the proximal tibial cutting guide was placed with appropriate slope, varus/valgus alignment and depth of resection. The proximal tibial cut was made. Gap blocks were then used to assess the extension gap and alignment, and appropriate soft tissue releases were performed. Attention was turned back to the femur, which was sized using the sizing guide to a size 7. Appropriate rotation of the femoral component was determined using epicondylar axis, Whiteside's line, and assessing the flexion gap under ligament tension. The appropriate size 4-in-1 cutting block was placed and cuts were made. Posterior femoral osteophytes and uncapped bone were then removed with the curved osteotome. The tibia was sized for a size 7 component. The femoral box-cutting guide was placed and prepared for a PS femoral component. Trial components were placed, and stability was checked in full extension, mid-flexion, and deep flexion. Proper tibial rotation was determined and marked.  The patella tracked well without a lateral release. Trial components were then removed and tibial preparation performed. A posterior capsular injection comprising of 20 cc of 1.3% exparel and 40 cc of normal saline was performed for postoperative pain control. The bony surfaces were irrigated with a pulse lavage and then dried. Bone cement was vacuum mixed on the back table, and the final components sized above were cemented into place. After cement had finished curing, excess cement was removed.  The stability of the construct was re-evaluated throughout a range of motion and  found to be acceptable. The trial liner was removed, the knee was copiously irrigated, and the knee was re-evaluated for any excess bone debris. The real polyethylene liner, 10 mm thick, was inserted and checked to ensure the locking mechanism had engaged appropriately. The tourniquet was deflated and hemostasis was achieved. The wound was irrigated with dilute betadine in normal saline, and then again with normal saline. A drain was placed.  Capsular closure was performed with a #1 vicryl, subcutaneous fat closed with a 2.0 vicryl suture, then subcutaneous tissue closed with interrupted 2.0 vicryl suture. The skin was then closed with a 2.0 nylon. A sterile dressing was applied.   The patient was awakened in the operating room and taken to recovery in stable condition. All sponge, needle, and instrument counts were correct at the end of the case.  Position: supine  Complications: none.  Time Out: performed   Drains/Packing: 1 HVAC  Estimated blood loss: 50 cc  Returned to Recovery Room: in good condition.   Antibiotics: yes   Mechanical VTE (DVT) Prophylaxis: sequential compression devices, TED thigh-high  Chemical VTE (DVT) Prophylaxis: aspirin  Fluid Replacement  Crystalloid: see anesthesia record Blood: none  FFP: none   Specimens Removed: 1 to pathology   Sponge and Instrument Count Correct? yes   PACU: portable radiograph - knee AP and Lateral   Admission: inpatient status, start PT & OT POD#1  Plan/RTC: Return in 2 weeks for wound check.   Weight Bearing/Load Lower Extremity: full   N. Glee ArvinMichael Xu, MD [facility] [phone] 2:28 PM

## 2016-01-01 NOTE — Anesthesia Preprocedure Evaluation (Addendum)
Anesthesia Evaluation  Patient identified by MRN, date of birth, ID band Patient awake    Reviewed: Allergy & Precautions, NPO status , Patient's Chart, lab work & pertinent test results  History of Anesthesia Complications Negative for: history of anesthetic complications  Airway Mallampati: II  TM Distance: >3 FB Neck ROM: Full    Dental  (+) Teeth Intact   Pulmonary    breath sounds clear to auscultation- rhonchi       Cardiovascular hypertension, Pt. on medications (-) angina(-) Past MI and (-) CHF  Rhythm:Regular     Neuro/Psych  Headaches, neg Seizures  Neuromuscular disease negative psych ROS   GI/Hepatic negative GI ROS, Neg liver ROS,   Endo/Other  negative endocrine ROS  Renal/GU negative Renal ROS     Musculoskeletal  (+) Arthritis ,   Abdominal   Peds  Hematology   Anesthesia Other Findings   Reproductive/Obstetrics                            Anesthesia Physical Anesthesia Plan  ASA: II  Anesthesia Plan: Spinal   Post-op Pain Management:    Induction: Intravenous  Airway Management Planned: Natural Airway, Nasal Cannula and Simple Face Mask  Additional Equipment: None  Intra-op Plan:   Post-operative Plan:   Informed Consent: I have reviewed the patients History and Physical, chart, labs and discussed the procedure including the risks, benefits and alternatives for the proposed anesthesia with the patient or authorized representative who has indicated his/her understanding and acceptance.   Dental advisory given  Plan Discussed with: CRNA and Surgeon  Anesthesia Plan Comments:        Anesthesia Quick Evaluation

## 2016-01-01 NOTE — Discharge Instructions (Signed)

## 2016-01-01 NOTE — Transfer of Care (Signed)
Immediate Anesthesia Transfer of Care Note  Patient: Walter Hodges  Procedure(s) Performed: Procedure(s): LEFT TOTAL KNEE ARTHROPLASTY (Left)  Patient Location: PACU  Anesthesia Type:MAC and Spinal  Level of Consciousness: awake, alert  and oriented  Airway & Oxygen Therapy: Patient Spontanous Breathing  Post-op Assessment: Report given to RN and Post -op Vital signs reviewed and stable  Post vital signs: Reviewed and stable  Last Vitals:  Filed Vitals:   01/01/16 1116  BP: 147/87  Pulse: 77  Temp: 36.6 C  Resp: 20    Last Pain: There were no vitals filed for this visit.       Complications: No apparent anesthesia complications

## 2016-01-01 NOTE — Anesthesia Procedure Notes (Addendum)
Procedure Name: MAC Date/Time: 01/01/2016 12:30 PM Performed by: Rise PatienceBELL, SARAH T Pre-anesthesia Checklist: Patient identified, Emergency Drugs available, Suction available and Patient being monitored Patient Re-evaluated:Patient Re-evaluated prior to inductionOxygen Delivery Method: Simple face mask Preoxygenation: Pre-oxygenation with 100% oxygen Intubation Type: IV induction Placement Confirmation: positive ETCO2 and breath sounds checked- equal and bilateral Dental Injury: Teeth and Oropharynx as per pre-operative assessment    Spinal Patient location during procedure: OR Staffing Anesthesiologist: Zaccheus Edmister Preanesthetic Checklist Completed: patient identified, surgical consent, pre-op evaluation, timeout performed, IV checked, risks and benefits discussed and monitors and equipment checked Spinal Block Patient position: sitting Prep: site prepped and draped and DuraPrep Patient monitoring: heart rate, cardiac monitor, continuous pulse ox and blood pressure Approach: midline Location: L4-5 Injection technique: single-shot Needle Needle type: Pencan  Needle gauge: 24 G Needle length: 10 cm Assessment Sensory level: T6

## 2016-01-01 NOTE — H&P (Signed)
    PREOPERATIVE H&P  Chief Complaint: left knee osteoarthritis  HPI: Walter Hodges is a 60 y.o. male who presents for surgical treatment of left knee osteoarthritis.  He denies any changes in medical history.  Past Medical History  Diagnosis Date  . Bell's palsy 2010    lasted one day ago, told that he had a ministroke  . Hypertension     followed by Dr. Nita Sells"maryann" with [facility]    . Arthritis   . Headache    Past Surgical History  Procedure Laterality Date  . Circumcision      done as a child- given inhalation   . Total knee arthroplasty Right 05/26/2015    Procedure: RIGHT TOTAL KNEE ARTHROPLASTY;  Surgeon: Tarry KosNaiping M Turon Kilmer, MD;  Location: [facility] OR;  Service: Orthopedics;  Laterality: Right;   Social History   Social History  . Marital Status: Widowed    Spouse Name: N/A  . Number of Children: N/A  . Years of Education: N/A   Social History Main Topics  . Smoking status: Never Smoker   . Smokeless tobacco: Not on file  . Alcohol Use: No  . Drug Use: No  . Sexual Activity: Not on file   Other Topics Concern  . Not on file   Social History Narrative   No family history on file. No Known Allergies Prior to Admission medications   Medication Sig Start Date End Date Taking? Authorizing Provider  hydrochlorothiazide (HYDRODIURIL) 25 MG tablet Take 25 mg by mouth daily.   Yes Historical Provider, MD  ibuprofen (ADVIL,MOTRIN) 800 MG tablet Take 800 mg by mouth every 8 (eight) hours as needed.   Yes Historical Provider, MD  lisinopril (PRINIVIL,ZESTRIL) 40 MG tablet Take 40 mg by mouth daily.   Yes Historical Provider, MD  oxyCODONE (OXY IR/ROXICODONE) 5 MG immediate release tablet Take 1-3 tablets (5-15 mg total) by mouth every 4 (four) hours as needed. 05/26/15  Yes Tarry KosNaiping M Rozella Servello, MD  OxyCODONE (OXYCONTIN) 10 mg T12A 12 hr tablet Take 1 tablet (10 mg total) by mouth every 12 (twelve) hours. 05/26/15  Yes Kyliah Deanda Donnelly StagerM Vannary Greening, MD  zolpidem (AMBIEN) 10 MG tablet Take 1 tablet (10 mg  total) by mouth at bedtime as needed for sleep. Patient taking differently: Take 10 mg by mouth at bedtime.  05/30/15 12/25/15 Yes Earlena Werst Donnelly StagerM Aasia Peavler, MD     Positive ROS: All other systems have been reviewed and were otherwise negative with the exception of those mentioned in the HPI and as above.  Physical Exam: General: Alert, no acute distress Cardiovascular: No pedal edema Respiratory: No cyanosis, no use of accessory musculature GI: abdomen soft Skin: No lesions in the area of chief complaint Neurologic: Sensation intact distally Psychiatric: Patient is competent for consent with normal mood and affect Lymphatic: no lymphedema  MUSCULOSKELETAL: exam stable  Assessment: left knee osteoarthritis  Plan: Plan for Procedure(s): LEFT TOTAL KNEE ARTHROPLASTY  The risks benefits and alternatives were discussed with the patient including but not limited to the risks of nonoperative treatment, versus surgical intervention including infection, bleeding, nerve injury,  blood clots, cardiopulmonary complications, morbidity, mortality, among others, and they were willing to proceed.   Cheral AlmasXu, Keyandra Swenson Benno, MD   01/01/2016 8:21 AM

## 2016-01-02 ENCOUNTER — Encounter (HOSPITAL_COMMUNITY): Payer: Self-pay | Admitting: Orthopaedic Surgery

## 2016-01-02 LAB — BASIC METABOLIC PANEL
ANION GAP: 10 (ref 5–15)
BUN: 11 mg/dL (ref 6–20)
CALCIUM: 8.5 mg/dL — AB (ref 8.9–10.3)
CO2: 25 mmol/L (ref 22–32)
Chloride: 102 mmol/L (ref 101–111)
Creatinine, Ser: 0.7 mg/dL (ref 0.61–1.24)
GFR calc non Af Amer: >60 mL/min (ref >60)
Glucose, Bld: 143 mg/dL — ABNORMAL HIGH (ref 65–99)
Potassium: 3.4 mmol/L — ABNORMAL LOW (ref 3.5–5.1)
Sodium: 137 mmol/L (ref 135–145)

## 2016-01-02 LAB — CBC
HEMATOCRIT: 34.9 % — AB (ref 39.0–52.0)
HEMOGLOBIN: 11.4 g/dL — AB (ref 13.0–17.0)
MCH: 26.9 pg (ref 26.0–34.0)
MCHC: 32.7 g/dL (ref 30.0–36.0)
MCV: 82.3 fL (ref 78.0–100.0)
Platelets: 254 10*3/uL (ref 150–400)
RBC: 4.24 MIL/uL (ref 4.22–5.81)
RDW: 14.2 % (ref 11.5–15.5)
WBC: 8.8 10*3/uL (ref 4.0–10.5)

## 2016-01-02 MED ORDER — POTASSIUM CHLORIDE CRYS ER 20 MEQ PO TBCR
40.0000 meq | EXTENDED_RELEASE_TABLET | Freq: Three times a day (TID) | ORAL | Status: AC
  Administered 2016-01-02 (×3): 40 meq via ORAL
  Filled 2016-01-02 (×4): qty 2

## 2016-01-02 NOTE — Progress Notes (Signed)
   Subjective:  Patient reports pain as marked overnight. Pulled out his drain overnight.  Objective:   VITALS:   Filed Vitals:   01/01/16 1603 01/01/16 1955 01/02/16 0021 01/02/16 0519  BP: 120/77 141/90 115/70 136/75  Pulse: 56 83 81 85  Temp: 97 F (36.1 C) 97.9 F (36.6 C) 97.8 F (36.6 C) 98.7 F (37.1 C)  TempSrc:  Oral Oral Oral  Resp: 16 18 18 18   Height:      Weight:      SpO2: 100% 98% 98% 100%    Neurologically intact Neurovascular intact Sensation intact distally Intact pulses distally Dorsiflexion/Plantar flexion intact Incision: dressing C/D/I and no drainage No cellulitis present Compartment soft   Lab Results  Component Value Date   WBC 8.8 01/02/2016   HGB 11.4* 01/02/2016   HCT 34.9* 01/02/2016   MCV 82.3 01/02/2016   PLT 254 01/02/2016     Assessment/Plan:  1 Day Post-Op   - Expected postop acute blood loss anemia - will monitor for symptoms - Up with PT/OT - DVT ppx - SCDs, ambulation, aspirin - WBAT operative extremity - Pain control - Kdur to replace potassium - Discharge planning - sat vs sun  Cheral AlmasXu, Naiping Soma 01/02/2016, 7:55 AM [phone]

## 2016-01-02 NOTE — Progress Notes (Signed)
Utilization review completed.  

## 2016-01-02 NOTE — Progress Notes (Signed)
Physical Therapy Progress Note Pt continues to make excellent advancement with gait and HEP. Educated for continued heel roll to promote knee extension. Will follow.    01/02/16 1122  PT Visit Information  Last PT Received On 01/02/16  Assistance Needed +1  History of Present Illness 60 yo admitted for Lt TKA. PMHx: Rt TKA 05/2015, OA, HTN, Bells Palsy  Precautions  Precautions Knee  Restrictions  LLE Weight Bearing WBAT  Pain Assessment  Pain Assessment 0-10  Pain Score 8  Pain Location left knee  Pain Descriptors / Indicators Aching  Pain Intervention(s) Limited activity within patient's tolerance;Monitored during session;Ice applied  Cognition  Arousal/Alertness Awake/alert  Behavior During Therapy WFL for tasks assessed/performed  Overall Cognitive Status Within Functional Limits for tasks assessed  Bed Mobility  Overal bed mobility Modified Independent  General bed mobility comments pt able to get up with ease, but with increased time due to pain.  Transfers  Overall transfer level Needs assistance  Transfers Sit to/from Stand  Sit to Stand Supervision  General transfer comment cues to reach back when sitting onto bed  Ambulation/Gait  Ambulation/Gait assistance Supervision  Ambulation Distance (Feet) 350 Feet  Assistive device Rolling walker (2 wheeled)  Gait Pattern/deviations Step-through pattern  General Gait Details cues for posture and shoulder depression  Gait velocity interpretation Below normal speed for age/gender  Stairs Yes  Stairs assistance Min guard  Stair Management One rail Left;Step to pattern;Forwards  Number of Stairs 6  General stair comments educated pt for stair sequence and cued for sequence throughout stair training  Exercises  Exercises Total Joint  Total Joint Exercises  Quad Sets AROM;Left;10 reps;Supine  Heel Slides AROM;Left;10 reps;Supine  Straight Leg Raises AAROM;Left;10 reps;Supine  Goniometric ROM 12-60  PT - End of Session   Equipment Utilized During Treatment Gait belt  Activity Tolerance Patient tolerated treatment well;No increased pain  Patient left in bed;with call bell/phone within reach  PT - Assessment/Plan  PT Plan Current plan remains appropriate  Follow Up Recommendations Outpatient PT  PT Goal Progression  Progress towards PT goals Progressing toward goals  PT Time Calculation  PT Start Time (ACUTE ONLY) 1112  PT Stop Time (ACUTE ONLY) 1138  PT Time Calculation (min) (ACUTE ONLY) 26 min  PT General Charges  $$ ACUTE PT VISIT 1 Procedure  PT Treatments  $Gait Training 8-22 mins  $Therapeutic Exercise 8-22 mins  Delaney MeigsMaija Tabor [place]illa [place], South CarolinaPT [phone]

## 2016-01-02 NOTE — Care Management Note (Addendum)
Case Management Note  Patient Details  Name: Walter Hodges MRN: [id number] Date of Birth: 11/02/1955  Subjective/Objective:               S/p left total knee arthroplasty     Action/Plan: Set up for outpatient PT by MD office. Spoke with patient, no change in discharge plan. Patient stated that his aunt will be assisting him after discharge. Contacted [facility] and requested rolling walker and 3N1 be delivered to patient's room.  Expected Discharge Date:                  Expected Discharge Plan:  Home/Self Care  In-House Referral:  NA  Discharge planning Services  CM Consult  Post Acute Care Choice:  Durable Medical Equipment Choice offered to:  Patient  DME Arranged:  3-N-1, Walker rolling DME Agency:  [facility]  HH Arranged:  NA HH Agency:     Status of Service:  Completed, signed off  Medicare Important Message Given:    Date Medicare IM Given:    Medicare IM give by:    Date Additional Medicare IM Given:    Additional Medicare Important Message give by:     If discussed at Long Length of Stay Meetings, dates discussed:    Additional Comments:  Monica BectonKrieg, Amiliah Campisi Watson, RN 01/02/2016, 2:15 PM

## 2016-01-02 NOTE — Evaluation (Signed)
Physical Therapy Evaluation Patient Details Name: Walter Hodges MRN: [id number] DOB: [birth year] Today's Date: 01/02/2016   History of Present Illness  60 yo admitted for Lt TKA. PMHx: Rt TKA 05/2015, OA, HTN, Bells Palsy  Clinical Impression  Pt pleasant and willing to work with therapy. Pt moves and ambulates at supervision level, only requiring cues for posture during ambulation. Educated pt on basic transfers, ambulation, HEP, and use of towel roll under ankle. Pt has decreased strength, ROM, and activity tolerance, and would benefit from acute therapy to maximize independence and improve quality of life. Pt reported that he has outpatient PT set up and would be able to find transportation to and from his appointments.    Follow Up Recommendations Outpatient PT    Equipment Recommendations  None recommended by PT    Recommendations for Other Services       Precautions / Restrictions Precautions Precautions: Knee Restrictions LLE Weight Bearing: Weight bearing as tolerated      Mobility  Bed Mobility Overal bed mobility: Needs Assistance Bed Mobility: Supine to Sit     Supine to sit: Supervision     General bed mobility comments: supervision for lines.  Transfers Overall transfer level: Needs assistance   Transfers: Sit to/from Stand Sit to Stand: Supervision         General transfer comment: supervision for safety  Ambulation/Gait Ambulation/Gait assistance: Supervision Ambulation Distance (Feet): 200 Feet Assistive device: Rolling walker (2 wheeled) Gait Pattern/deviations: Step-through pattern;Decreased stride length   Gait velocity interpretation: Below normal speed for age/gender General Gait Details: pt able to step through and land on heel without cues. cues for posture and shoulder depression.  Stairs            Wheelchair Mobility    Modified Rankin (Stroke Patients Only)       Balance Overall balance assessment: No apparent balance  deficits (not formally assessed)                                           Pertinent Vitals/Pain Pain Assessment: 0-10 Pain Score: 8  Pain Location: left knee Pain Descriptors / Indicators: Aching Pain Intervention(s): Limited activity within patient's tolerance;Monitored during session;RN gave pain meds during session;Ice applied    Home Living Family/patient expects to be discharged to:: Private residence Living Arrangements: Other relatives Available Help at Discharge: Family;Available 24 hours/day Type of Home: House Home Access: Stairs to enter Entrance Stairs-Rails: Doctor, general practice of Steps: 6 Home Layout: One level Home Equipment: Walker - 2 wheels;Cane - single point Additional Comments: stays with aunt    Prior Function Level of Independence: Independent               Hand Dominance        Extremity/Trunk Assessment   Upper Extremity Assessment: Overall WFL for tasks assessed           Lower Extremity Assessment: LLE deficits/detail   LLE Deficits / Details: decreased strength and ROM as expected post op  Cervical / Trunk Assessment: Normal  Communication   Communication: No difficulties  Cognition Arousal/Alertness: Awake/alert Behavior During Therapy: WFL for tasks assessed/performed Overall Cognitive Status: Within Functional Limits for tasks assessed                      General Comments      Exercises Total Joint Exercises Quad  Sets: AROM;Left;5 reps;Supine Heel Slides: AAROM;Left;5 reps;Supine Straight Leg Raises: AAROM;Left;5 reps;Supine      Assessment/Plan    PT Assessment Patient needs continued PT services  PT Diagnosis Difficulty walking;Acute pain   PT Problem List Decreased strength;Decreased range of motion;Decreased activity tolerance;Decreased mobility;Decreased knowledge of use of DME  PT Treatment Interventions DME instruction;Gait training;Stair training;Functional  mobility training;Therapeutic activities;Therapeutic exercise;Patient/family education   PT Goals (Current goals can be found in the Care Plan section) Acute Rehab PT Goals Patient Stated Goal: play basketball and run track PT Goal Formulation: With patient Time For Goal Achievement: 01/09/16 Potential to Achieve Goals: Good    Frequency 7X/week   Barriers to discharge        Co-evaluation               End of Session Equipment Utilized During Treatment: Gait belt Activity Tolerance: Patient tolerated treatment well Patient left: in chair;with call bell/phone within reach Nurse Communication: Mobility status         Time: 0722-0753 PT Time Calculation (min) (ACUTE ONLY): 31 min   Charges:   PT Evaluation $PT Eval Moderate Complexity: 1 Procedure PT Treatments $Gait Training: 8-22 mins   PT G CodesCherene Julian 01/10/2016, 8:13 AM   Cherene Julian, SPT [phone]

## 2016-01-02 NOTE — Progress Notes (Signed)
Occupational Therapy Evaluation Patient Details Name: Walter Hodges MRN: [id number] DOB: 01/07/1956 Today's Date: 01/02/2016    History of Present Illness 60 yo admitted for Lt TKA. PMHx: Rt TKA 05/2015, OA, HTN, Bells Palsy   Clinical Impression   Completed all education regarding compensatory techniques for ADL and functional mobility for ADL. Pt at Mod I level this pm. Pt ready to D/C home with intermittent S when medically stable from OT standpoint.    Follow Up Recommendations  No OT follow up;Supervision - Intermittent    Equipment Recommendations  3 in 1 bedside comode    Recommendations for Other Services       Precautions / Restrictions Precautions Precautions: Knee Restrictions LLE Weight Bearing: Weight bearing as tolerated      Mobility Bed Mobility Overal bed mobility: Modified Independent                Transfers Overall transfer level: Modified independent                    Balance Overall balance assessment: No apparent balance deficits (not formally assessed)                                          ADL Overall ADL's : Needs assistance/impaired                                     Functional mobility during ADLs: Rolling walker;Modified independent General ADL Comments: educated on use of 3in1 as tub seat if desired. Pt states he will just sponge bath. Educated on compensatory techniques for bathing and dressing. Pt verbalized understadning.     Vision     Perception     Praxis      Pertinent Vitals/Pain Pain Assessment: 0-10 Pain Score: 7  Pain Location: L knee Pain Descriptors / Indicators: Burning;Aching Pain Intervention(s): Limited activity within patient's tolerance;Repositioned;Ice applied     Hand Dominance Right   Extremity/Trunk Assessment Upper Extremity Assessment Upper Extremity Assessment: Overall WFL for tasks assessed   Lower Extremity Assessment Lower Extremity  Assessment: Defer to PT evaluation   Cervical / Trunk Assessment Cervical / Trunk Assessment: Normal   Communication Communication Communication: No difficulties   Cognition Arousal/Alertness: Awake/alert Behavior During Therapy: WFL for tasks assessed/performed Overall Cognitive Status: Within Functional Limits for tasks assessed                     General Comments       Exercises       Shoulder Instructions      Home Living Family/patient expects to be discharged to:: Private residence Living Arrangements: Other relatives Available Help at Discharge: Family;Available 24 hours/day Type of Home: House Home Access: Stairs to enter [employer] of Steps: 6 Entrance Stairs-Rails: Right;Left Home Layout: One level     Bathroom Shower/Tub: Tub/shower unit;Walk-in shower Shower/tub characteristics: Door Firefighter: Standard Bathroom Accessibility: Yes How Accessible: Accessible via walker Home Equipment: Walker - 2 wheels          Prior Functioning/Environment Level of Independence: Independent             OT Diagnosis: Generalized weakness;Acute pain   OT Problem List: Decreased strength;Decreased range of motion;Decreased knowledge of use of DME or AE;Pain   OT Treatment/Interventions:  OT Goals(Current goals can be found in the care plan section) Acute Rehab OT Goals Patient Stated Goal: be active OT Goal Formulation: All assessment and education complete, DC therapy  OT Frequency:     Barriers to D/C:            Co-evaluation              End of Session Equipment Utilized During Treatment: Rolling walker Nurse Communication: Mobility status  Activity Tolerance: Patient tolerated treatment well Patient left: in chair;with call bell/phone within reach   Time: 3244-0102 OT Time Calculation (min): 24 min Charges:  OT General Charges $OT Visit: 1 Procedure OT Evaluation $OT Eval Low Complexity: 1 Procedure OT  Treatments $Self Care/Home Management : 8-22 mins G-Codes:    Jaymar Loeber,HILLARY 2016/01/26, 4:52 PM   [facility], OTR/L  [phone] Jan 26, 2016

## 2016-01-02 NOTE — Progress Notes (Signed)
Pt pulled out drain to knee. On assessment no new drainage to bandage/dressing. On-call physician paged and made aware. No new orders or instructions. Pt will continue without drain until further notice. Nursing will continue to monitor pt.

## 2016-01-03 NOTE — Discharge Summary (Signed)
Physician Discharge Summary      Patient ID: Walter Hodges MRN: [id number] DOB/AGE: 05/22/1956 60 y.o.  Admit date: 01/01/2016 Discharge date: 01/03/2016  Admission Diagnoses:  <principal problem not specified>  Discharge Diagnoses:  Active Problems:   Total knee replacement status   Past Medical History  Diagnosis Date  . Bell's palsy 2010    lasted one day ago, told that he had a ministroke  . Hypertension     followed by Dr. Nita Sells" with [facility]    . Arthritis   . Headache     Surgeries: Procedure(s): LEFT TOTAL KNEE ARTHROPLASTY on 01/01/2016   Consultants (if any):    Discharged Condition: Improved  Hospital Course: Walter Hodges is an 60 y.o. male who was admitted 01/01/2016 with a diagnosis of <principal problem not specified> and went to the operating room on 01/01/2016 and underwent the above named procedures.    He was given perioperative antibiotics:  Anti-infectives    Start     Dose/Rate Route Frequency Ordered Stop   01/01/16 1800  ceFAZolin (ANCEF) IVPB 2g/100 mL premix     2 g 200 mL/hr over 30 Minutes Intravenous Every 6 hours 01/01/16 1612 01/02/16 0022   01/01/16 1200  ceFAZolin (ANCEF) IVPB 2g/100 mL premix     2 g 200 mL/hr over 30 Minutes Intravenous To [facility] 12/31/15 0758 01/01/16 1224    .  He was given sequential compression devices, early ambulation, and aspirin for DVT prophylaxis.  He benefited maximally from the hospital stay and there were no complications.    Recent vital signs:  Filed Vitals:   01/02/16 2026 01/03/16 0518  BP: 146/73 126/71  Pulse: 96 85  Temp: 98.4 F (36.9 C) 97.7 F (36.5 C)  Resp: 18 18    Recent laboratory studies:  Lab Results  Component Value Date   HGB 11.4* 01/02/2016   HGB 12.9* 12/29/2015   HGB 10.3* 05/29/2015   Lab Results  Component Value Date   WBC 8.8 01/02/2016   PLT 254 01/02/2016   Lab Results  Component Value Date   INR 1.07 12/29/2015   Lab Results   Component Value Date   NA 137 01/02/2016   K 3.4* 01/02/2016   CL 102 01/02/2016   CO2 25 01/02/2016   BUN 11 01/02/2016   CREATININE 0.70 01/02/2016   GLUCOSE 143* 01/02/2016    Discharge Medications:     Medication List    TAKE these medications        aspirin EC 325 MG tablet  Take 1 tablet (325 mg total) by mouth 2 (two) times daily.     hydrochlorothiazide 25 MG tablet  Commonly known as:  HYDRODIURIL  Take 25 mg by mouth daily.     ibuprofen 800 MG tablet  Commonly known as:  ADVIL,MOTRIN  Take 800 mg by mouth every 8 (eight) hours as needed.     lisinopril 40 MG tablet  Commonly known as:  PRINIVIL,ZESTRIL  Take 40 mg by mouth daily.     methocarbamol 750 MG tablet  Commonly known as:  ROBAXIN  Take 1 tablet (750 mg total) by mouth 2 (two) times daily as needed for muscle spasms.     ondansetron 4 MG tablet  Commonly known as:  ZOFRAN  Take 1-2 tablets (4-8 mg total) by mouth every 8 (eight) hours as needed for nausea or vomiting.     oxyCODONE 5 MG immediate release tablet  Commonly known as:  Oxy IR/ROXICODONE  Take 1-3 tablets (5-15 mg total) by mouth every 4 (four) hours as needed.     oxyCODONE 10 mg 12 hr tablet  Commonly known as:  OXYCONTIN  Take 1 tablet (10 mg total) by mouth every 12 (twelve) hours.     oxyCODONE 5 MG immediate release tablet  Commonly known as:  Oxy IR/ROXICODONE  Take 1-3 tablets (5-15 mg total) by mouth every 4 (four) hours as needed.     oxyCODONE 10 mg 12 hr tablet  Commonly known as:  OXYCONTIN  Take 1 tablet (10 mg total) by mouth every 12 (twelve) hours.     senna-docusate 8.6-50 MG tablet  Commonly known as:  SENOKOT S  Take 1 tablet by mouth at bedtime as needed.     zolpidem 10 MG tablet  Commonly known as:  AMBIEN  Take 1 tablet (10 mg total) by mouth at bedtime as needed for sleep.        Diagnostic Studies: Dg Knee Left Port  01/01/2016  CLINICAL DATA:  Status post left knee arthroplasty. EXAM:  PORTABLE LEFT KNEE - 1-2 VIEW COMPARISON:  Fahrenheit FINDINGS: The femoral and tibial prosthetic components are well-seated and aligned. There is no acute fracture or evidence of an operative complication. IMPRESSION: Well-positioned left knee arthroplasty. Electronically Signed   By: Amie Portlandavid  Ormond M.D.   On: 01/01/2016 15:56    Disposition: 06-Home-Health Care Svc      Discharge Instructions    Call MD / Call 911    Complete by:  As directed   If you experience chest pain or shortness of breath, CALL 911 and be transported to the hospital emergency room.  If you develope a fever above 101.5 F, pus (white drainage) or increased drainage or redness at the wound, or calf pain, call your surgeon's office.     Constipation Prevention    Complete by:  As directed   Drink plenty of fluids.  Prune juice may be helpful.  You may use a stool softener, such as Colace (over the counter) 100 mg twice a day.  Use MiraLax (over the counter) for constipation as needed.     Diet - low sodium heart healthy    Complete by:  As directed      Diet general    Complete by:  As directed      Driving restrictions    Complete by:  As directed   No driving while taking narcotic pain meds.     Increase activity slowly as tolerated    Complete by:  As directed            Follow-up Information    Follow up with Cheral AlmasXu, Naiping Tyree, MD In 2 weeks.   Specialty:  Orthopedic Surgery   Why:  For suture removal, For wound re-check   Contact information:   [address] [place] [address] [phone]        Signed: Cheral AlmasXu, Naiping Demitrios 01/03/2016, 8:57 AM

## 2016-01-03 NOTE — Anesthesia Postprocedure Evaluation (Signed)
Anesthesia Post Note  Patient: Walter Hodges  Procedure(s) Performed: Procedure(s) (LRB): LEFT TOTAL KNEE ARTHROPLASTY (Left)  Patient location during evaluation: PACU Anesthesia Type: Spinal Level of consciousness: awake Pain management: pain level controlled Vital Signs Assessment: post-procedure vital signs reviewed and stable Respiratory status: spontaneous breathing Cardiovascular status: stable Postop Assessment: no signs of nausea or vomiting and spinal receding Anesthetic complications: no    Last Vitals:  Filed Vitals:   01/02/16 1016 01/02/16 2026  BP: 146/78 146/73  Pulse: 88 96  Temp: 36.9 C 36.9 C  Resp: 17 18    Last Pain:  Filed Vitals:   01/03/16 0340  PainSc: 9                  Ziyonna Christner

## 2016-01-03 NOTE — Progress Notes (Signed)
Physical Therapy Treatment Patient Details Name: Walter Hodges MRN: [id number] DOB: April 09, 1956 Today's Date: 01/03/2016    History of Present Illness 60 yo admitted for Lt TKA. 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 Pt encouraged to maintain heel roll at rest, continue HEP and increase gait as able. Will continue to follow. Safe for return home when medically cleared.   Follow Up Recommendations  Outpatient PT     Equipment Recommendations       Recommendations for Other Services       Precautions / Restrictions Precautions Precautions: Knee Restrictions LLE Weight Bearing: Weight bearing as tolerated    Mobility  Bed Mobility Overal bed mobility: Modified Independent                Transfers Overall transfer level: Modified independent                  Ambulation/Gait Ambulation/Gait assistance: Supervision Ambulation Distance (Feet): 225 Feet Assistive device: Rolling walker (2 wheeled) Gait Pattern/deviations: Step-through pattern;Decreased stride length;Decreased dorsiflexion - left   Gait velocity interpretation: Below normal speed for age/gender General Gait Details: cues for increased dorsiflexion, decreased reliance on RW   Stairs            Wheelchair Mobility    Modified Rankin (Stroke Patients Only)       Balance                                    Cognition Arousal/Alertness: Awake/alert Behavior During Therapy: WFL for tasks assessed/performed Overall Cognitive Status: Within Functional Limits for tasks assessed                      Exercises Total Joint Exercises Heel Slides: AROM;Left;15 reps;Supine Hip ABduction/ADduction: AROM;Left;15 reps;Supine Straight Leg Raises: AROM;Left;5 reps;Supine Goniometric ROM: 8-65    General Comments        Pertinent Vitals/Pain Pain Score: 6   Pain Location: left knee Pain Descriptors / Indicators: Aching;Throbbing Pain Intervention(s): Limited activity within patient's tolerance;Monitored during session;Premedicated before session;Repositioned;Ice applied    Home Living                      Prior Function            PT Goals (current goals can now be found in the care plan section) Progress towards PT goals: Progressing toward goals    Frequency       PT Plan Current plan remains appropriate    Co-evaluation             End of Session   Activity Tolerance: Patient tolerated treatment well Patient left: in chair;with call bell/phone within reach     Time: 0724-0748 PT Time Calculation (min) (ACUTE ONLY): 24 min  Charges:  $Gait Training: 8-22 mins $Therapeutic Exercise: 8-22 mins                    G Codes:      Walter Hodges, Walter Hodges 01/03/2016, 8:23 AM Walter Hodges, PT [phone]

## 2016-01-03 NOTE — Progress Notes (Signed)
   Subjective:  Patient reports pain as marked overnight. Pulled out his drain overnight.  Objective:   VITALS:   Filed Vitals:   01/02/16 0519 01/02/16 1016 01/02/16 2026 01/03/16 0518  BP: 136/75 146/78 146/73 126/71  Pulse: 85 88 96 85  Temp: 98.7 F (37.1 C) 98.4 F (36.9 C) 98.4 F (36.9 C) 97.7 F (36.5 C)  TempSrc: Oral Oral Oral Oral  Resp: 18 17 18 18   Height:      Weight:      SpO2: 100% 95% 99% 98%    Neurologically intact Neurovascular intact Sensation intact distally Intact pulses distally Dorsiflexion/Plantar flexion intact Incision: dressing C/D/I and no drainage No cellulitis present Compartment soft   Lab Results  Component Value Date   WBC 8.8 01/02/2016   HGB 11.4* 01/02/2016   HCT 34.9* 01/02/2016   MCV 82.3 01/02/2016   PLT 254 01/02/2016     Assessment/Plan:  2 Days Post-Op   - Expected postop acute blood loss anemia - will monitor for symptoms - Up with PT/OT - DVT ppx - SCDs, ambulation, aspirin - WBAT operative extremity - Pain control - dressing changed - dc home today   Cheral AlmasXu, Lyle Leisner Keevan 01/03/2016, 8:55 AM [phone]

## 2016-01-05 ENCOUNTER — Other Ambulatory Visit (HOSPITAL_COMMUNITY): Payer: Self-pay | Admitting: Orthopaedic Surgery

## 2016-01-05 DIAGNOSIS — M7989 Other specified soft tissue disorders: Principal | ICD-10-CM

## 2016-01-05 DIAGNOSIS — M79661 Pain in right lower leg: Secondary | ICD-10-CM

## 2016-01-06 ENCOUNTER — Other Ambulatory Visit (HOSPITAL_COMMUNITY): Payer: Self-pay | Admitting: Orthopaedic Surgery

## 2016-01-06 ENCOUNTER — Ambulatory Visit (HOSPITAL_COMMUNITY)
Admission: RE | Admit: 2016-01-06 | Discharge: 2016-01-06 | Disposition: A | Discharge status: Home / Self Care | Payer: Medicaid Other | Source: Ambulatory Visit | Attending: Orthopaedic Surgery | Admitting: Orthopaedic Surgery

## 2016-01-06 DIAGNOSIS — M7989 Other specified soft tissue disorders: Secondary | ICD-10-CM

## 2016-01-06 DIAGNOSIS — Z96652 Presence of left artificial knee joint: Secondary | ICD-10-CM | POA: Diagnosis not present

## 2016-01-06 DIAGNOSIS — M79662 Pain in left lower leg: Secondary | ICD-10-CM

## 2016-01-06 DIAGNOSIS — M79661 Pain in right lower leg: Secondary | ICD-10-CM

## 2016-01-06 DIAGNOSIS — M79605 Pain in left leg: Secondary | ICD-10-CM | POA: Diagnosis present

## 2016-01-06 NOTE — Progress Notes (Signed)
VASCULAR LAB PRELIMINARY  PRELIMINARY  PRELIMINARY  PRELIMINARY  Left lower extremity venous duplex completed.    Preliminary report:  Left:  No evidence of DVT, superficial thrombosis, or Baker's cyst.  Walter Hodges, RVS 01/06/2016, 9:41 AM

## 2016-01-07 ENCOUNTER — Encounter: Payer: Self-pay | Admitting: Family Medicine

## 2016-01-07 ENCOUNTER — Ambulatory Visit: Payer: Medicaid Other | Attending: Family Medicine | Admitting: Family Medicine

## 2016-01-07 ENCOUNTER — Ambulatory Visit: Payer: Medicaid Other | Attending: Orthopaedic Surgery | Admitting: Physical Therapy

## 2016-01-07 ENCOUNTER — Encounter: Payer: Self-pay | Admitting: Physical Therapy

## 2016-01-07 VITALS — BP 148/87 | HR 95 | Temp 98.0°F | Resp 18 | Ht 70.0 in | Wt 239.6 lb

## 2016-01-07 DIAGNOSIS — Z96652 Presence of left artificial knee joint: Secondary | ICD-10-CM

## 2016-01-07 DIAGNOSIS — H538 Other visual disturbances: Secondary | ICD-10-CM | POA: Diagnosis not present

## 2016-01-07 DIAGNOSIS — Z79899 Other long term (current) drug therapy: Secondary | ICD-10-CM | POA: Diagnosis not present

## 2016-01-07 DIAGNOSIS — K0889 Other specified disorders of teeth and supporting structures: Secondary | ICD-10-CM | POA: Diagnosis not present

## 2016-01-07 DIAGNOSIS — R262 Difficulty in walking, not elsewhere classified: Secondary | ICD-10-CM | POA: Insufficient documentation

## 2016-01-07 DIAGNOSIS — Z9889 Other specified postprocedural states: Secondary | ICD-10-CM | POA: Insufficient documentation

## 2016-01-07 DIAGNOSIS — Z7982 Long term (current) use of aspirin: Secondary | ICD-10-CM | POA: Insufficient documentation

## 2016-01-07 DIAGNOSIS — I10 Essential (primary) hypertension: Secondary | ICD-10-CM | POA: Insufficient documentation

## 2016-01-07 LAB — POCT GLYCOSYLATED HEMOGLOBIN (HGB A1C): Hemoglobin A1C: 5.8

## 2016-01-07 MED ORDER — HYDROCHLOROTHIAZIDE 25 MG PO TABS: 25.0000 mg | ORAL_TABLET | Freq: Every day | ORAL | Status: DC

## 2016-01-07 MED ORDER — LISINOPRIL 40 MG PO TABS: 40.0000 mg | ORAL_TABLET | Freq: Every day | ORAL | Status: DC

## 2016-01-07 MED FILL — ?HYDROCHLOROTHIAZIDE 25 MG: 25 MG | 30 days supply | Qty: 30 | Fill #0

## 2016-01-07 MED FILL — LISINOPRIL 40 MG TABLET: 40 | 30 days supply | Qty: 30 | Fill #0

## 2016-01-07 NOTE — Progress Notes (Signed)
Subjective:  Patient ID: Walter Hodges, male    DOB: 06-08-1956  Age: 60 y.o. MRN: [id number]  CC: Establish Care   HPI Walter Hodges is a 60 year old male with a history of hypertension and bilateral knee osteoarthritis recently hospitalized from 01/01/16 through 01/03/16 for total left knee replacement. He previously had a right knee replacement in the fall of last year. Postop course was uneventful and he was discharged with a follow-up with orthopedics.  He does not have a PCP - was [facility] and comes in today to establish care. He would like a referral to a dentist because his implants have fallen out and he currently hurts at the site.  He also complains of blurry vision for the last 6-8 months and complains of difficulty driving due to not seen properly under adverse weather conditions. He currently uses reading glasses.  Outpatient Prescriptions Prior to Visit  Medication Sig Dispense Refill  . aspirin EC 325 MG tablet Take 1 tablet (325 mg total) by mouth 2 (two) times daily. 84 tablet 0  . ibuprofen (ADVIL,MOTRIN) 800 MG tablet Take 800 mg by mouth every 8 (eight) hours as needed.    . methocarbamol (ROBAXIN) 750 MG tablet Take 1 tablet (750 mg total) by mouth 2 (two) times daily as needed for muscle spasms. 60 tablet 0  . ondansetron (ZOFRAN) 4 MG tablet Take 1-2 tablets (4-8 mg total) by mouth every 8 (eight) hours as needed for nausea or vomiting. 40 tablet 0  . oxyCODONE (OXY IR/ROXICODONE) 5 MG immediate release tablet Take 1-3 tablets (5-15 mg total) by mouth every 4 (four) hours as needed. 90 tablet 0  . oxyCODONE (OXY IR/ROXICODONE) 5 MG immediate release tablet Take 1-3 tablets (5-15 mg total) by mouth every 4 (four) hours as needed. 90 tablet 0  . oxyCODONE (OXYCONTIN) 10 mg 12 hr tablet Take 1 tablet (10 mg total) by mouth every 12 (twelve) hours. 10 tablet 0  . OxyCODONE (OXYCONTIN) 10 mg T12A 12 hr tablet Take 1 tablet (10 mg total) by mouth  every 12 (twelve) hours. 10 tablet 0  . senna-docusate (SENOKOT S) 8.6-50 MG tablet Take 1 tablet by mouth at bedtime as needed. 30 tablet 1  . hydrochlorothiazide (HYDRODIURIL) 25 MG tablet Take 25 mg by mouth daily.    Marland Kitchen. lisinopril (PRINIVIL,ZESTRIL) 40 MG tablet Take 40 mg by mouth daily.    Marland Kitchen. zolpidem (AMBIEN) 10 MG tablet Take 1 tablet (10 mg total) by mouth at bedtime as needed for sleep. (Patient taking differently: Take 10 mg by mouth at bedtime. ) 30 tablet 0   No facility-administered medications prior to visit.    ROS Review of Systems  Constitutional: Negative for activity change and appetite change.  HENT: Negative for sinus pressure and sore throat.   Eyes: Negative for visual disturbance.  Respiratory: Negative for cough, chest tightness and shortness of breath.   Cardiovascular: Negative for chest pain and leg swelling.  Gastrointestinal: Negative for abdominal pain, diarrhea, constipation and abdominal distention.  Endocrine: Negative.   Genitourinary: Negative for dysuria.  Musculoskeletal:       See history of present illness  Skin: Negative for rash.  Allergic/Immunologic: Negative.   Neurological: Negative for weakness, light-headedness and numbness.  Psychiatric/Behavioral: Negative for suicidal ideas and dysphoric mood.    Objective:  BP 148/87 mmHg  Pulse 95  Temp(Src) 98 F (36.7 C) (Oral)  Resp 18  Ht 5\' 10"  (1.778 m)  Wt 239  lb 9.6 oz (108.682 kg)  BMI 34.38 kg/m2  SpO2 98%  BP/Weight 01/07/2016 01/03/2016 01/01/2016  Systolic BP 148 126 -  Diastolic BP 87 71 -  Wt. (Lbs) 239.6 - 244  BMI 34.38 - 35.01      Physical Exam  Constitutional: He is oriented to person, place, and time. He appears well-developed and well-nourished.  Cardiovascular: Normal rate, normal heart sounds and intact distal pulses.   No murmur heard. Pulmonary/Chest: Effort normal and breath sounds normal. He has no wheezes. He has no rales. He exhibits no tenderness.    Abdominal: Soft. Bowel sounds are normal. He exhibits no distension and no mass. There is no tenderness.  Musculoskeletal:  Right knee vertical healed surgical scar Left knee with fresh vertical surgical scar with surrounding erythema  Neurological: He is alert and oriented to person, place, and time.     Assessment & Plan:   1. Status post total left knee replacement Currently in pain and has oxycodone which he takes Educated about constipating side effects and he no see use MiraLAX laxative. Scheduled to see orthopedics on 01/15/16  2. Essential hypertension Mildly elevated blood pressure could be secondary to pain I will make no changes to regimen - lisinopril (PRINIVIL,ZESTRIL) 40 MG tablet; Take 1 tablet (40 mg total) by mouth daily.  Dispense: 30 tablet; Refill: 2 - hydrochlorothiazide (HYDRODIURIL) 25 MG tablet; Take 1 tablet (25 mg total) by mouth daily.  Dispense: 30 tablet; Refill: 2  3. Blurry vision, bilateral Screen for diabetes mellitus is negative - HgB A1c - Ambulatory referral to Optometry  4. Toothache - Ambulatory referral to Dentistry   Meds ordered this encounter  Medications  . lisinopril (PRINIVIL,ZESTRIL) 40 MG tablet    Sig: Take 1 tablet (40 mg total) by mouth daily.    Dispense:  30 tablet    Refill:  2  . hydrochlorothiazide (HYDRODIURIL) 25 MG tablet    Sig: Take 1 tablet (25 mg total) by mouth daily.    Dispense:  30 tablet    Refill:  2    Follow-up: Return in about 1 month (around 02/07/2016) for Follow-up of hypertension.   Jaclyn Shaggy MD

## 2016-01-07 NOTE — Progress Notes (Signed)
Patient's here to establish care.  Patient vision has been blurry x6-8 months now with HA's.  Patient requesting referral to Dentist.  Requesting med refills.

## 2016-01-07 NOTE — Therapy (Signed)
[place] [facility] MAIN [facility] SERVICES [address] [place], [place], [address] Phone: [phone]   Fax:  [phone]  Physical Therapy Evaluation  Patient Details  Name: Walter Hodges MRN: [id number] Date of Birth: [birth year] Referring Provider: Tarry Kos  Encounter Date: 01/07/2016      PT End of Session - 01/07/16 0923    Visit Number 1   Number of Visits 25   Date for PT Re-Evaluation 03/03/16   Authorization Type orange card   PT Start Time 0900   PT Stop Time 0945   PT Time Calculation (min) 45 min      Past Medical History  Diagnosis Date  . Bell's palsy 2010    lasted one day ago, told that he had a ministroke  . Hypertension     followed by Dr. Nita Sells" with [facility]    . Arthritis   . Headache     Past Surgical History  Procedure Laterality Date  . Circumcision      done as a child- given inhalation   . Total knee arthroplasty Right 05/26/2015    Procedure: RIGHT TOTAL KNEE ARTHROPLASTY;  Surgeon: Tarry Kos, MD;  Location: [facility] OR;  Service: Orthopedics;  Laterality: Right;  . Total knee arthroplasty Left 01/01/2016    Procedure: LEFT TOTAL KNEE ARTHROPLASTY;  Surgeon: Tarry Kos, MD;  Location: [facility] OR;  Service: Orthopedics;  Laterality: Left;    There were no vitals filed for this visit.       Subjective Assessment - 01/07/16 0917    Subjective Patient is having high pain level 9/10   Pain Score 9             [facility] PT Assessment - 01/07/16 0001    Assessment   Medical Diagnosis left TKR   Referring Provider Gershon Mussel M   Onset Date/Surgical Date 01/01/16   Hand Dominance Right   Next MD Visit 01/15/16   Prior Therapy no   Precautions   Precautions None   Restrictions   Weight Bearing Restrictions No   Balance Screen   Has the patient fallen in the past 6 months No   Has the patient had a decrease in activity level because of a fear of falling?  Yes   Is the patient reluctant to leave their  home because of a fear of falling?  No   Home Environment   Living Environment Private residence   Living Arrangements Other relatives   Available Help at Discharge Family   Type of Home House   Home Access Stairs to enter   Entrance Stairs-Number of Steps about 6   Entrance Stairs-Rails Cannot reach both   Home Layout One level   Home Equipment Walker - 2 wheels   Prior Function   Level of Independence Independent   Vocation Unemployed   Cognition   Overall Cognitive Status Within Functional Limits for tasks assessed    PAIN: 9/10 left knee  POSTURE: WNL   PROM/AROM:-5 deg to 85 deg PROM AROM -6- 70 deg  STRENGTH:  Graded on a 0-5 scale Muscle Group Left Right  Shoulder flex    Shoulder Abd    Shoulder Ext    Shoulder IR/ER    Elbow    Wrist/hand    Hip Flex -3 5  Hip Abd -3 5  Hip Add 2 5  Hip Ext NT 5  Hip IR/ER  5  Knee Flex -3 5  Knee Ext -3  5  Ankle DF 5 5  Ankle PF 5 5   SENSATION: WNL     FUNCTIONAL MOBILITY: guarded mobility    GAIT: Gait training with  RW and SBA 225 feet  OUTCOME MEASURES: TEST Outcome Interpretation  5 times sit<>stand 42. 42ec >60 yo, >15 sec indicates increased risk for falls  10 meter walk test  .31               m/s <1.0 m/s indicates increased risk for falls; limited community ambulator                      6 minute walk test   225             Feet 1000 feet is community ambulator    <36/56 (100% risk for falls), 37-45 (80% risk for falls); 46-51 (>50% risk for falls); 52-55 (lower risk <25% of falls)                               PT Education - 2016-01-26 0922    Education provided Yes   Education Details HEP   Person(s) Educated Patient   Methods Explanation   Comprehension Verbalized understanding             PT Long Term Goals - 01/26/16 1000    PT LONG TERM GOAL #1   Title Patient will be independent in home exercise program to improve strength/mobility for better functional  independence with ADLs  03/03/16   Time 8   Period Weeks   Status New   PT LONG TERM GOAL #2   Title Patient (> 54 years old) will complete five times sit to stand test in < 15 seconds indicating an increased LE strength and improved balance.  03/03/16   Time 8   Period Weeks   Status New   PT LONG TERM GOAL #3   Title Patient will increase six minute walk test distance to >1000 for progression to community ambulator and improve gait ability 03/03/16   Time 8   Period Weeks   Status New   PT LONG TERM GOAL #4   Title Patient will increase 10 meter walk test to >1.85m/s as to improve gait speed for better community ambulation and to reduce fall risk 03/03/16   Time 8   Period Weeks   Status New               Plan - 26-Jan-2016 0924    Clinical Impression Statement Patient has decreased strength and ROM to left knee. He is ambulating with RW short distances with 9/10 pain to left knee.    Rehab Potential Good   PT Frequency 3x / week   PT Duration 8 weeks   PT Treatment/Interventions Passive range of motion;Scar mobilization;Manual techniques;Therapeutic exercise;Therapeutic activities;Functional mobility training;Stair training;Gait training;Electrical Stimulation;Moist Heat;Cryotherapy   PT Next Visit Plan theraeputic exercise   PT Home Exercise Plan ankle pumps , knee fllex,       Patient will benefit from skilled therapeutic intervention in order to improve the following deficits and impairments:  Decreased endurance, Increased edema, Decreased activity tolerance, Decreased strength, Pain, Difficulty walking, Decreased mobility, Impaired flexibility  Visit Diagnosis: Difficulty in walking, not elsewhere classified      G-Codes - 01/26/2016 0956    Functional Assessment Tool Used 10 MW, 6 MW, 5 x sit to stand   Functional Limitation Mobility: Walking and  moving around   Mobility: Walking and Moving Around Current Status [phone]([id number]) At least 40 percent but less than 60 percent  impaired, limited or restricted   Mobility: Walking and Moving Around Goal Status [phone]([phone]) At least 20 percent but less than 40 percent impaired, limited or restricted       Problem List Patient Active Problem List   Diagnosis Date Noted  . Total knee replacement status 01/01/2016  . Primary osteoarthritis of right knee 05/26/2015  Ezekiel InaKristine S [place], PT, DPT  [place], Barkley BrunsKristine S 01/07/2016, 10:02 AM  [facility] [address] [place], [place], [address] Phone: [phone]   Fax:  [phone]  Name: Walter Hodges MRN: [id number] Date of Birth: Sep 30, 1955

## 2016-01-07 NOTE — Patient Instructions (Signed)
Hypertension Hypertension, commonly called high blood pressure, is when the force of blood pumping through your arteries is too strong. Your arteries are the blood vessels that carry blood from your heart throughout your body. A blood pressure reading consists of a higher number over a lower number, such as 110/72. The higher number (systolic) is the pressure inside your arteries when your heart pumps. The lower number (diastolic) is the pressure inside your arteries when your heart relaxes. Ideally you want your blood pressure below 120/80. Hypertension forces your heart to work harder to pump blood. Your arteries may become narrow or stiff. Having untreated or uncontrolled hypertension can cause heart attack, stroke, kidney disease, and other problems. RISK FACTORS Some risk factors for high blood pressure are controllable. Others are not.  Risk factors you cannot control include:   Race. You may be at higher risk if you are African American.  Age. Risk increases with age.  Gender. Men are at higher risk than women before age 45 years. After age 65, women are at higher risk than men. Risk factors you can control include:  Not getting enough exercise or physical activity.  Being overweight.  Getting too much fat, sugar, calories, or salt in your diet.  Drinking too much alcohol. SIGNS AND SYMPTOMS Hypertension does not usually cause signs or symptoms. Extremely high blood pressure (hypertensive crisis) may cause headache, anxiety, shortness of breath, and nosebleed. DIAGNOSIS To check if you have hypertension, your health care provider will measure your blood pressure while you are seated, with your arm held at the level of your heart. It should be measured at least twice using the same arm. Certain conditions can cause a difference in blood pressure between your right and left arms. A blood pressure reading that is higher than normal on one occasion does not mean that you need treatment. If  it is not clear whether you have high blood pressure, you may be asked to return on a different day to have your blood pressure checked again. Or, you may be asked to monitor your blood pressure at home for 1 or more weeks. TREATMENT Treating high blood pressure includes making lifestyle changes and possibly taking medicine. Living a healthy lifestyle can help lower high blood pressure. You may need to change some of your habits. Lifestyle changes may include:  Following the DASH diet. This diet is high in fruits, vegetables, and whole grains. It is low in salt, red meat, and added sugars.  Keep your sodium intake below 2,300 mg per day.  Getting at least 30-45 minutes of aerobic exercise at least 4 times per week.  Losing weight if necessary.  Not smoking.  Limiting alcoholic beverages.  Learning ways to reduce stress. Your health care provider may prescribe medicine if lifestyle changes are not enough to get your blood pressure under control, and if one of the following is true:  You are 18-59 years of age and your systolic blood pressure is above 140.  You are 60 years of age or older, and your systolic blood pressure is above 150.  Your diastolic blood pressure is above 90.  You have diabetes, and your systolic blood pressure is over 140 or your diastolic blood pressure is over 90.  You have kidney disease and your blood pressure is above 140/90.  You have heart disease and your blood pressure is above 140/90. Your personal target blood pressure may vary depending on your medical conditions, your age, and other factors. HOME CARE INSTRUCTIONS    Have your blood pressure rechecked as directed by your health care provider.   Take medicines only as directed by your health care provider. Follow the directions carefully. Blood pressure medicines must be taken as prescribed. The medicine does not work as well when you skip doses. Skipping doses also puts you at risk for  problems.  Do not smoke.   Monitor your blood pressure at home as directed by your health care provider. SEEK MEDICAL CARE IF:   You think you are having a reaction to medicines taken.  You have recurrent headaches or feel dizzy.  You have swelling in your ankles.  You have trouble with your vision. SEEK IMMEDIATE MEDICAL CARE IF:  You develop a severe headache or confusion.  You have unusual weakness, numbness, or feel faint.  You have severe chest or abdominal pain.  You vomit repeatedly.  You have trouble breathing. MAKE SURE YOU:   Understand these instructions.  Will watch your condition.  Will get help right away if you are not doing well or get worse.   This information is not intended to replace advice given to you by your health care provider. Make sure you discuss any questions you have with your health care provider.   Document Released: 08/23/2005 Document Revised: 01/07/2015 Document Reviewed: 06/15/2013 Elsevier Interactive Patient Education 2016 Elsevier Inc.  

## 2016-01-09 ENCOUNTER — Encounter: Payer: Self-pay | Admitting: Family Medicine

## 2016-01-12 ENCOUNTER — Other Ambulatory Visit: Payer: Self-pay | Admitting: *Deleted

## 2016-01-12 ENCOUNTER — Ambulatory Visit: Payer: Medicaid Other | Admitting: Physical Therapy

## 2016-01-12 DIAGNOSIS — R262 Difficulty in walking, not elsewhere classified: Secondary | ICD-10-CM | POA: Diagnosis not present

## 2016-01-12 NOTE — Telephone Encounter (Signed)
Patient is requesting prescription for Ambien....patient states he is having difficulty sleeping due to pain from surgery.   Please followup

## 2016-01-12 NOTE — Therapy (Signed)
[place] [facility] MAIN [facility] SERVICES [address] [place], [place], [address] Phone: [phone]   Fax:  [phone]  Physical Therapy Treatment  Patient Details  Name: Walter Hodges MRN: [id number] Date of Birth: November 23, 1955 Referring Provider: Tarry Kos  Encounter Date: 01/12/2016      PT End of Session - 01/12/16 0854    Visit Number 2   Number of Visits 25   Date for PT Re-Evaluation 03/03/16   Authorization Type orange card   PT Start Time 0845   PT Stop Time 0915   PT Time Calculation (min) 30 min      Past Medical History  Diagnosis Date  . Bell's palsy 2010    lasted one day ago, told that he had a ministroke  . Hypertension     followed by Dr. Nita Sells" with [facility]    . Arthritis   . Headache     Past Surgical History  Procedure Laterality Date  . Circumcision      done as a child- given inhalation   . Total knee arthroplasty Right 05/26/2015    Procedure: RIGHT TOTAL KNEE ARTHROPLASTY;  Surgeon: Tarry Kos, MD;  Location: [facility] OR;  Service: Orthopedics;  Laterality: Right;  . Total knee arthroplasty Left 01/01/2016    Procedure: LEFT TOTAL KNEE ARTHROPLASTY;  Surgeon: Tarry Kos, MD;  Location: [facility] OR;  Service: Orthopedics;  Laterality: Left;    There were no vitals filed for this visit.      Subjective Assessment - 01/12/16 0853    Subjective Patient is having high pain level 9/10   Pain Onset 1 to 4 weeks ago     Therapeutic exercise: Nu-step x 10 minutes Knee flexion seated with YTB x 20 LAQ, SAQ, SLR with assist x 10 x 2 Ice to left knee after treatment  ROM -10 to 85 deg                             PT Education - 01/12/16 0853    Education provided Yes   Education Details HEP   Person(s) Educated Patient   Methods Explanation   Comprehension Verbalized understanding             PT Long Term Goals - 01/07/16 1000    PT LONG TERM GOAL #1   Title Patient will be  independent in home exercise program to improve strength/mobility for better functional independence with ADLs  03/03/16   Time 8   Period Weeks   Status New   PT LONG TERM GOAL #2   Title Patient (> 62 years old) will complete five times sit to stand test in < 15 seconds indicating an increased LE strength and improved balance.  03/03/16   Time 8   Period Weeks   Status New   PT LONG TERM GOAL #3   Title Patient will increase six minute walk test distance to >1000 for progression to community ambulator and improve gait ability 03/03/16   Time 8   Period Weeks   Status New   PT LONG TERM GOAL #4   Title Patient will increase 10 meter walk test to >1.49m/s as to improve gait speed for better community ambulation and to reduce fall risk 03/03/16   Time 8   Period Weeks   Status New               Plan - 01/12/16 6962  Clinical Impression Statement Patient has high level of pain in left knee , decreased ROM and decreased strength. Pateint is walking with spc for intermediate and short distances.    Rehab Potential Good   PT Frequency 3x / week   PT Duration 8 weeks   PT Treatment/Interventions Passive range of motion;Scar mobilization;Manual techniques;Therapeutic exercise;Therapeutic activities;Functional mobility training;Stair training;Gait training;Electrical Stimulation;Moist Heat;Cryotherapy   PT Next Visit Plan theraeputic exercise   PT Home Exercise Plan ankle pumps , knee fllex,       Patient will benefit from skilled therapeutic intervention in order to improve the following deficits and impairments:  Decreased endurance, Increased edema, Decreased activity tolerance, Decreased strength, Pain, Difficulty walking, Decreased mobility, Impaired flexibility  Visit Diagnosis: Difficulty in walking, not elsewhere classified     Problem List Patient Active Problem List   Diagnosis Date Noted  . Hypertension 01/07/2016  . Total knee replacement status 01/01/2016  .  Primary osteoarthritis of right knee 05/26/2015   Ezekiel InaKristine S Willistine Ferrall, PT, DPT [place], Thurman Sarver S 01/12/2016, 8:56 AM  [facility] [address] [place], [place], [address] Phone: [phone]   Fax:  [phone]  Name: Walter Hodges MRN: [id number] Date of Birth: 1956-02-05

## 2016-01-13 ENCOUNTER — Encounter: Payer: Self-pay | Admitting: Physical Therapy

## 2016-01-13 ENCOUNTER — Ambulatory Visit: Payer: Medicaid Other | Admitting: Physical Therapy

## 2016-01-13 DIAGNOSIS — R262 Difficulty in walking, not elsewhere classified: Secondary | ICD-10-CM | POA: Diagnosis not present

## 2016-01-13 NOTE — Therapy (Signed)
[place] [facility] [address] , [place], [address] Phone: [phone]   Fax:  [phone]  Physical Therapy Treatment  Patient Details  Name: Walter Hodges MRN: [id number] Date of Birth: [birth year] Referring Provider: Tarry KosXU, NAIPING M  Encounter Date: 01/13/2016      PT End of Session - 01/13/16 0923    Visit Number 3   Number of Visits 25   Date for PT Re-Evaluation 03/03/16   Authorization Type orange card   PT Start Time 0915   PT Stop Time 0955   PT Time Calculation (min) 40 min      Past Medical History  Diagnosis Date  . Bell's palsy 2010    lasted one day ago, told that he had a ministroke  . Hypertension     followed by Dr. Nita Sells"maryann" with [facility]    . Arthritis   . Headache     Past Surgical History  Procedure Laterality Date  . Circumcision      done as a child- given inhalation   . Total knee arthroplasty Right 05/26/2015    Procedure: RIGHT TOTAL KNEE ARTHROPLASTY;  Surgeon: Tarry KosNaiping M Xu, MD;  [facility];  Service: Orthopedics;  Laterality: Right;  . Total knee arthroplasty Left 01/01/2016    Procedure: LEFT TOTAL KNEE ARTHROPLASTY;  Surgeon: Tarry KosNaiping M Xu, MD;  [facility] OR;  Service: Orthopedics;  Laterality: Left;    There were no vitals filed for this visit.      Subjective Assessment - 01/13/16 0922    Subjective Patient turned his leg while he was getting out of the car.    Currently in Pain? Yes   Pain Score 7    Pain Location Knee   Pain Orientation Left       Nu-step x 10 minutes LAQ, SAQ, SLR, hip abd/add, heel slides, quad sets x 10 x 2 ROM -10 to 95 deg left knee  Patient has high levels of pain prior, during and after treatment.                          PT Education - 01/13/16 [phone]    Education provided Yes   Education Details HEP   Person(s) Educated Patient   Methods Explanation   Comprehension Verbalized understanding             PT Long Term Goals - 01/07/16 1000    PT LONG TERM GOAL #1   Title Patient will be independent in home exercise program to improve strength/mobility for better functional independence with ADLs  03/03/16   Time 8   Period Weeks   Status New   PT LONG TERM GOAL #2   Title Patient (> 61 years old) will complete five times sit to stand test in < 15 seconds indicating an increased LE strength and improved balance.  03/03/16   Time 8   Period Weeks   Status New   PT LONG TERM GOAL #3   Title Patient will increase six minute walk test distance to >1000 for progression to community ambulator and improve gait ability 03/03/16   Time 8   Period Weeks   Status New   PT LONG TERM GOAL #4   Title Patient will increase 10 meter walk test to >1.2865m/s as to improve gait speed for better community ambulation and to reduce fall risk 03/03/16   Time 8   Period Weeks   Status  New               Plan - 01/13/16 0923    Clinical Impression Statement Patient has high level of pain 7/10 to left knee. He continues to have decreased ROM and strength and is able to perform AROM to left knee and exercises for TLR protocal.    Rehab Potential Good   PT Frequency 3x / week   PT Duration 8 weeks   PT Treatment/Interventions Passive range of motion;Scar mobilization;Manual techniques;Therapeutic exercise;Therapeutic activities;Functional mobility training;Stair training;Gait training;Electrical Stimulation;Moist Heat;Cryotherapy   PT Next Visit Plan theraeputic exercise   PT Home Exercise Plan ankle pumps , knee fllex,       Patient will benefit from skilled therapeutic intervention in order to improve the following deficits and impairments:  Decreased endurance, Increased edema, Decreased activity tolerance, Decreased strength, Pain, Difficulty walking, Decreased mobility, Impaired flexibility  Visit Diagnosis: Difficulty in walking, not elsewhere classified     Problem List Patient Active  Problem List   Diagnosis Date Noted  . Hypertension 01/07/2016  . Total knee replacement status 01/01/2016  . Primary osteoarthritis of right knee 05/26/2015   [name], PT, DPT [place], Barkley Bruns S 01/13/2016, 9:25 AM  [place] [facility] MAIN [facility] SERVICES [address] [place], [place], [address] Phone: [phone]   Fax:  [phone]  Name: Walter Hodges MRN: [id number] Date of Birth: 11-05-1955

## 2016-01-15 ENCOUNTER — Ambulatory Visit: Payer: Medicaid Other | Admitting: Physical Therapy

## 2016-01-15 ENCOUNTER — Encounter: Payer: Self-pay | Admitting: Physical Therapy

## 2016-01-15 DIAGNOSIS — R262 Difficulty in walking, not elsewhere classified: Secondary | ICD-10-CM

## 2016-01-15 NOTE — Therapy (Signed)
Period Weeks   Status New               Plan - 01/15/16 0909    Clinical Impression Statement Patient has high level of pain 7/10 to left knee. He has medicaid and treatment sessions will be requensted today for skilled PT.    Rehab Potential Good   PT Frequency 3x / week   PT Duration 8 weeks   PT Treatment/Interventions Passive range of motion;Scar mobilization;Manual techniques;Therapeutic exercise;Therapeutic activities;Functional mobility training;Stair training;Gait training;Electrical Stimulation;Moist Heat;Cryotherapy   PT Next Visit Plan theraeputic exercise   PT Home Exercise Plan ankle pumps , knee fllex,       Patient will benefit from skilled therapeutic intervention in order to improve the following deficits and impairments:  Decreased endurance, Increased edema, Decreased activity tolerance, Decreased strength, Pain, Difficulty walking, Decreased mobility, Impaired flexibility  Visit Diagnosis: Difficulty in walking, not elsewhere classified     Problem List Patient  Active Problem List   Diagnosis Date Noted  . Hypertension 01/07/2016  . Total knee replacement status 01/01/2016  . Primary osteoarthritis of right knee 05/26/2015   Ezekiel InaKristine S Yulissa Needham, PT, DPT [place], Barkley BrunsKristine S 01/15/2016, 9:12 AM  [place] [facility] [address] [place], [place], [address] Phone: [phone]   Fax:  [phone]  Name: Walter Hodges MRN: [id number] Date of Birth: 01-29-1956   [facility] MAIN [facility] SERVICES [address] [place], [place], [address] Phone: [phone]   Fax:  [phone]  Physical Therapy Treatment  Patient Details  Name: Walter Hodges MRN: [id number] Date of Birth: December 03, 1955 Referring Provider: Tarry Kos  Encounter Date: 01/15/2016      PT End of Session - 01/15/16 0908    Visit Number 4   Number of Visits 25   Date for PT Re-Evaluation 03/03/16   Authorization Type orange card   PT Start Time 0845   PT Stop Time 0900   PT Time Calculation (min) 15 min      Past Medical History  Diagnosis Date  . Bell's palsy 2010    lasted one day ago, told that he had a ministroke  . Hypertension     followed by Dr[facility] Nita Sells" with [facility]    . Arthritis   . Headache     Past Surgical History  Procedure Laterality Date  . Circumcision      done as a child- given inhalation   . Total knee arthroplasty Right 05/26/2015    Procedure: RIGHT TOTAL KNEE ARTHROPLASTY;  Surgeon: Tarry Kos, MD;  [facility];  Service: Orthopedics;  Laterality: Right;  . Total knee arthroplasty Left 01/01/2016    Procedure: LEFT TOTAL KNEE ARTHROPLASTY;  Surgeon: Tarry Kos, MD;  [facility] OR;  Service: Orthopedics;  Laterality: Left;    There were no vitals filed for this visit.      Subjective Assessment - 01/15/16 0907    Subjective Patient has been trying to ascend/descend the steps at home and stretch his knee.    Currently in Pain? Yes   Pain Score 7    Pain Onset 1 to 4 weeks ago     Patient has high level of pain in left knee 7/10.  Patient was seen for nu-step with no charge today secondary to finding out he has medicaid and needs his visits to be approved.                              PT Education - 01/15/16 0908    Education provided Yes   Education Details hamstring stretch and knee flex stretch   Person(s) Educated Patient   Methods Explanation   Comprehension Verbalized understanding             PT Long Term Goals - 01/07/16 1000    PT LONG TERM GOAL #1   Title Patient will be independent in home exercise program to improve strength/mobility for better functional independence with ADLs  03/03/16   Time 8   Period Weeks   Status New   PT LONG TERM GOAL #2   Title Patient (> 61 years old) will complete five times sit to stand test in < 15 seconds indicating an increased LE strength and improved balance.  03/03/16   Time 8   Period Weeks   Status New   PT LONG TERM GOAL #3   Title Patient will increase six minute walk test distance to >1000 for progression to community ambulator and improve gait ability 03/03/16   Time 8   Period Weeks   Status New   PT LONG TERM GOAL #4   Title Patient will increase 10 meter walk test to >1.55m/s as to improve gait speed for better community ambulation and to reduce fall risk 03/03/16   Time 8

## 2016-01-20 ENCOUNTER — Encounter: Payer: Self-pay | Admitting: Physical Therapy

## 2016-01-22 ENCOUNTER — Encounter: Payer: Self-pay | Admitting: Physical Therapy

## 2016-01-22 ENCOUNTER — Other Ambulatory Visit: Payer: Self-pay

## 2016-01-22 MED ORDER — ZOLPIDEM TARTRATE 5 MG PO TABS: 5.0000 mg | ORAL_TABLET | Freq: Every evening | ORAL | Status: DC | PRN

## 2016-01-22 NOTE — Telephone Encounter (Signed)
Placed call to patient, patient verified name and DOB. Patient states that he is still in a lot of pain from his surgery. Patient requesting pain meds also. He informed me that his current medication for pain isn't working. Patient will receive a call once Dr. Venetia NightAmao approve Walter Lofflerambien and new Rx for pain medication.

## 2016-01-22 NOTE — Progress Notes (Signed)
He was supposed to see Orthopedics for a follow up visit after his visit with me; he will need to get in touch with them regarding his post op pain which if controlled will help his sleep.

## 2016-01-22 NOTE — Telephone Encounter (Signed)
Ambien 5mg  written; he will need to come in to the clinic to pick this up as it is a controlled substance

## 2016-01-23 ENCOUNTER — Telehealth: Payer: Self-pay

## 2016-01-23 ENCOUNTER — Other Ambulatory Visit: Payer: Self-pay | Admitting: Family Medicine

## 2016-01-23 DIAGNOSIS — S025XXA Fracture of tooth (traumatic), initial encounter for closed fracture: Secondary | ICD-10-CM | POA: Insufficient documentation

## 2016-01-23 MED ORDER — ZOLPIDEM TARTRATE 5 MG PO TABS: 5.0000 mg | ORAL_TABLET | Freq: Every evening | ORAL | Status: DC | PRN

## 2016-01-23 NOTE — Telephone Encounter (Signed)
Placed call to patient, patient verified name and DOB. Patient was informed that his Ambien is available for pickup at the front desk. Patient requesting a dental referral.

## 2016-01-23 NOTE — Progress Notes (Signed)
Prescription reordered today as unsigned original prescription from yesterday was not found.

## 2016-01-27 ENCOUNTER — Ambulatory Visit: Payer: Medicaid Other | Admitting: Physical Therapy

## 2016-01-27 ENCOUNTER — Encounter: Payer: Self-pay | Admitting: Physical Therapy

## 2016-01-27 DIAGNOSIS — R262 Difficulty in walking, not elsewhere classified: Secondary | ICD-10-CM | POA: Diagnosis not present

## 2016-01-27 NOTE — Therapy (Signed)
[place] [facility] [address] [place], [place], [address] Phone: [phone]   Fax:  [phone]  Physical Therapy Treatment  Patient Details  Name: Walter HolmsMichael A Hodges MRN: [id number] Date of Birth: 1956/06/18 Referring Provider: Tarry KosXU, NAIPING M  Encounter Date: 01/27/2016      PT End of Session - 01/27/16 0850    Visit Number 5   Number of Visits 1   Authorization Type medicaid   PT Start Time [phone]   PT Stop Time 0915   PT Time Calculation (min) 40 min   Activity Tolerance Patient limited by pain      Past Medical History  Diagnosis Date  . Bell's palsy 2010    lasted one day ago, told that he had a ministroke  . Hypertension     followed by Dr. Nita Sells"maryann" with [facility]    . Arthritis   . Headache     Past Surgical History  Procedure Laterality Date  . Circumcision      done as a child- given inhalation   . Total knee arthroplasty Right 05/26/2015    Procedure: RIGHT TOTAL KNEE ARTHROPLASTY;  Surgeon: Tarry KosNaiping M Xu, MD;  [facility];  Service: Orthopedics;  Laterality: Right;  . Total knee arthroplasty Left 01/01/2016    Procedure: LEFT TOTAL KNEE ARTHROPLASTY;  Surgeon: Tarry KosNaiping M Xu, MD;  [facility];  Service: Orthopedics;  Laterality: Left;    There were no vitals filed for this visit.      Subjective Assessment - 01/27/16 0849    Subjective Patient has been trying to ascend/descend the steps at home and stretch his knee.    Currently in Pain? Yes   Pain Score 6    Pain Onset 1 to 4 weeks ago      Therapeutic exercise: Nu-step x 10 mins level 12 and 11 with no resistance  Hamstring stretch x 30 sec x 3, knee flex stretch at steps 30 sec x 3 Seated knee flex with pillow case on the floor PROM knee flex left to 95 deg left knee SAQ x 20  LAQ x 20 Knee flex with RTB x 20  PROM -5 to 95 deg left knee Ice following x 10 mintues Patient needs occasional verbal cueing to improve posture and cueing  to correctly perform exercises slowly, holding at end of range to increase motor firing of desired muscle to encourage fatigue.                            PT Education - 01/27/16 0849    Education provided Yes   Education Details stretching exericses and ice at home   Person(s) Educated Patient   Methods Explanation   Comprehension Verbalized understanding             PT Long Term Goals - 01/07/16 1000    PT LONG TERM GOAL #1   Title Patient will be independent in home exercise program to improve strength/mobility for better functional independence with ADLs  03/03/16   Time 8   Period Weeks   Status New   PT LONG TERM GOAL #2   Title Patient (> 61 years old) will complete five times sit to stand test in < 15 seconds indicating an increased LE strength and improved balance.  03/03/16   Time 8   Period Weeks   Status New   PT LONG TERM GOAL #3   Title Patient  will increase six minute walk test distance to >1000 for progression to community ambulator and improve gait ability 03/03/16   Time 8   Period Weeks   Status New   PT LONG TERM GOAL #4   Title Patient will increase 10 meter walk test to >1.25m/s as to improve gait speed for better community ambulation and to reduce fall risk 03/03/16   Time 8   Period Weeks   Status New               Plan - 01/27/16 0851    Clinical Impression Statement Patient has 6/10 pain and reports that he is feeling very stiff in his left knee. He is able to perform stretching and PROM and exercises for TKR protocal.    Rehab Potential Good   PT Frequency 3x / week   PT Duration 8 weeks   PT Treatment/Interventions Passive range of motion;Scar mobilization;Manual techniques;Therapeutic exercise;Therapeutic activities;Functional mobility training;Stair training;Gait training;Electrical Stimulation;Moist Heat;Cryotherapy   PT Next Visit Plan theraeputic exercise   PT Home Exercise Plan ankle pumps , knee fllex,        Patient will benefit from skilled therapeutic intervention in order to improve the following deficits and impairments:  Decreased endurance, Increased edema, Decreased activity tolerance, Decreased strength, Pain, Difficulty walking, Decreased mobility, Impaired flexibility  Visit Diagnosis: Difficulty in walking, not elsewhere classified     Problem List Patient Active Problem List   Diagnosis Date Noted  . Broken tooth, complicated 01/23/2016  . Hypertension 01/07/2016  . Total knee replacement status 01/01/2016  . Primary osteoarthritis of right knee 05/26/2015  [name], PT, DPT  [place] S 01/27/2016, 8:55 AM  [place] [facility] MAIN [facility] SERVICES [address] [place], [place], [address] Phone: [phone]   Fax:  [phone]  Name: Walter Hodges MRN: [id number] Date of Birth: [birth year]

## 2016-01-29 ENCOUNTER — Ambulatory Visit: Payer: Medicaid Other | Admitting: Physical Therapy

## 2016-01-29 ENCOUNTER — Encounter: Payer: Self-pay | Admitting: Physical Therapy

## 2016-01-29 DIAGNOSIS — R262 Difficulty in walking, not elsewhere classified: Secondary | ICD-10-CM

## 2016-01-29 NOTE — Therapy (Signed)
sit to stand test in < 15 seconds indicating an increased LE strength and improved balance.  03/03/16   Time 8   Period Weeks   Status New   PT LONG TERM GOAL #3   Title Patient will increase six minute walk test distance to >1000 for progression to community ambulator and improve gait ability 03/03/16   Time 8   Period Weeks   Status New   PT LONG TERM GOAL #4   Title Patient will increase 10 meter walk test to >1.3047m/s as to improve gait speed for better community ambulation and to reduce fall risk 03/03/16   Time 8   Period Weeks   Status New               Plan - 01/29/16 0840    Clinical Impression Statement Patient has 8/10 pain to left knee and his pain is limited in his abiity to participate in therapy.    Rehab Potential Good   PT Frequency 3x / week   PT Duration 8 weeks   PT Treatment/Interventions Passive range of motion;Scar mobilization;Manual techniques;Therapeutic exercise;Therapeutic activities;Functional mobility training;Stair training;Gait  training;Electrical Stimulation;Moist Heat;Cryotherapy   PT Next Visit Plan theraeputic exercise   PT Home Exercise Plan ankle pumps , knee fllex,       Patient will benefit from skilled therapeutic intervention in order to improve the following deficits and impairments:  Decreased endurance, Increased edema, Decreased activity tolerance, Decreased strength, Pain, Difficulty walking, Decreased mobility, Impaired flexibility  Visit Diagnosis: Difficulty in walking, not elsewhere classified     Problem List Patient Active Problem List   Diagnosis Date Noted  . Broken tooth, complicated 01/23/2016  . Hypertension 01/07/2016  . Total knee replacement status 01/01/2016  . Primary osteoarthritis of right knee 05/26/2015  Ezekiel InaKristine S Margarethe Virgen, PT, DPT  [place], Anahy Esh S 01/29/2016, 8:42 AM  [place] [facility] [address] [place], [place], [address] Phone: [phone]   Fax:  [phone]  Name: Arthur HolmsMichael A Mailhot MRN: [id number] Date of Birth: 11-Dec-1955  [place] [facility] MAIN [facility] SERVICES [address] [place], [place], [address] Phone: [phone]   Fax:  [phone]  Physical Therapy Treatment  Patient Details  Name: Walter Hodges MRN: [id number] Date of Birth: 11-Dec-1955 Referring Provider: Tarry Kos  Encounter Date: 01/29/2016      PT End of Session - 01/29/16 0839    Visit Number 6   Number of Visits 2   Authorization Type medicaid   PT Start Time [phone]   PT Stop Time 0915   PT Time Calculation (min) 40 min   Activity Tolerance Patient limited by pain   Behavior During Therapy [facility] for tasks assessed/performed      Past Medical History  Diagnosis Date  . Bell's palsy 2010    lasted one day ago, told that he had a ministroke  . Hypertension     followed by Dr[facility] Nita Sells" with [facility]    . Arthritis   . Headache     Past Surgical History  Procedure Laterality Date  . Circumcision      done as a child- given inhalation   . Total knee arthroplasty Right 05/26/2015    Procedure: RIGHT TOTAL KNEE ARTHROPLASTY;  Surgeon: Tarry Kos, MD;  [facility];  Service: Orthopedics;  Laterality: Right;  . Total knee arthroplasty Left 01/01/2016    Procedure: LEFT TOTAL KNEE ARTHROPLASTY;  Surgeon: Tarry Kos, MD;  [facility] OR;  Service: Orthopedics;  Laterality: Left;    There were no vitals filed for this visit.      Subjective Assessment - 01/29/16 0838    Subjective Patient has been trying to ascend/descend the steps at home and stretch his knee. Patient is having high level of pain in left knee 8/10   Currently in Pain? Yes   Pain Score 8    Pain Location Knee   Pain Orientation Left   Pain Descriptors / Indicators Aching   Pain Onset 1 to 4 weeks ago          Therapeutic exercise: Nu-step x 10 mins level 12 and 11 with no resistance  Hamstring stretch x 30 sec x 3, knee flex stretch at steps 30 sec x 3 Seated knee flex with pillow case on the floor PROM  knee flex left to 95 deg left knee SAQ x 20  LAQ x 20 Knee flex with RTB x 20  PROM -5 to 95 deg left knee Ice following x 10 mintues Patient needs occasional verbal cueing to improve posture and cueing to correctly perform exercises slowly, holding at end of range to increase motor firing of desired muscle to encourage fatigue.                                  PT Education - 01/29/16 0839    Education provided Yes   Education Details stretching   Person(s) Educated Patient   Methods Explanation   Comprehension Verbalized understanding             PT Long Term Goals - 01/07/16 1000    PT LONG TERM GOAL #1   Title Patient will be independent in home exercise program to improve strength/mobility for better functional independence with ADLs  03/03/16   Time 8   Period Weeks   Status New   PT LONG TERM GOAL #2   Title Patient (> 58 years old) will complete five times

## 2016-02-03 ENCOUNTER — Ambulatory Visit: Payer: Medicaid Other | Admitting: Physical Therapy

## 2016-02-03 ENCOUNTER — Encounter: Payer: Self-pay | Admitting: Physical Therapy

## 2016-02-03 DIAGNOSIS — R262 Difficulty in walking, not elsewhere classified: Secondary | ICD-10-CM

## 2016-02-03 NOTE — Therapy (Signed)
[place] [facility] [address] [place], [place], [address] Phone: [phone]   Fax:  [phone]  Physical Therapy Treatment  Patient Details  Name: Walter Hodges MRN: [id number] Date of Birth: 1956/02/16 Referring Provider: Tarry KosXU, NAIPING M  Encounter Date: 02/03/2016      PT End of Session - 02/03/16 0843    Visit Number 7   Number of Visits 3   Authorization Type medicaid   PT Start Time [phone]   PT Stop Time 0915   PT Time Calculation (min) 40 min   Activity Tolerance Patient limited by pain   Behavior During Therapy [facility] for tasks assessed/performed      Past Medical History  Diagnosis Date  . Bell's palsy 2010    lasted one day ago, told that he had a ministroke  . Hypertension     followed by Dr. Nita Sells"maryann" with [facility]    . Arthritis   . Headache     Past Surgical History  Procedure Laterality Date  . Circumcision      done as a child- given inhalation   . Total knee arthroplasty Right 05/26/2015    Procedure: RIGHT TOTAL KNEE ARTHROPLASTY;  Surgeon: Tarry KosNaiping M Xu, MD;  Location: [facility] OR;  Service: Orthopedics;  Laterality: Right;  . Total knee arthroplasty Left 01/01/2016    Procedure: LEFT TOTAL KNEE ARTHROPLASTY;  Surgeon: Tarry KosNaiping M Xu, MD;  Location: [facility] OR;  Service: Orthopedics;  Laterality: Left;    There were no vitals filed for this visit.      Subjective Assessment - 02/03/16 0841    Subjective Patient has been doing his stretches and HEP.    Currently in Pain? Yes   Pain Score 7    Pain Onset 1 to 4 weeks ago        Therapeutic exercise;   LAQ x 20 SAQ x 20 SLR x 10 x 2 Quad sets x 10 with 5 sec hold Knee flex x 10 with RTB PROM to left knee x 5 with hold of 5 sec. -5 to 95 left knee Ice following treatment  Patient needs occasional verbal cueing to improve posture and cueing to correctly perform exercises slowly, holding at end of range to increase motor firing of desired muscle to  encourage fatigue.                           PT Education - 02/03/16 [phone]    Education provided Yes   Education Details stretching and ice   Person(s) Educated Patient   Methods Explanation   Comprehension Verbalized understanding             PT Long Term Goals - 01/07/16 1000    PT LONG TERM GOAL #1   Title Patient will be independent in home exercise program to improve strength/mobility for better functional independence with ADLs  03/03/16   Time 8   Period Weeks   Status New   PT LONG TERM GOAL #2   Title Patient (> 61 years old) will complete five times sit to stand test in < 15 seconds indicating an increased LE strength and improved balance.  03/03/16   Time 8   Period Weeks   Status New   PT LONG TERM GOAL #3   Title Patient will increase six minute walk test distance to >1000 for progression to community ambulator and improve gait ability 03/03/16   Time 8  Period Weeks   Status New   PT LONG TERM GOAL #4   Title Patient will increase 10 meter walk test to >1.59m/s as to improve gait speed for better community ambulation and to reduce fall risk 03/03/16   Time 8   Period Weeks   Status New               Plan - 02/03/16 0843    Clinical Impression Statement Pateint has stiffness and pain in his left knee. He performes TKR protocal wiht pain behaviors.    Rehab Potential Good   PT Frequency 3x / week   PT Duration 8 weeks   PT Treatment/Interventions Passive range of motion;Scar mobilization;Manual techniques;Therapeutic exercise;Therapeutic activities;Functional mobility training;Stair training;Gait training;Electrical Stimulation;Moist Heat;Cryotherapy   PT Next Visit Plan theraeputic exercise   PT Home Exercise Plan ankle pumps , knee fllex,       Patient will benefit from skilled therapeutic intervention in order to improve the following deficits and impairments:  Decreased endurance, Increased edema, Decreased activity tolerance,  Decreased strength, Pain, Difficulty walking, Decreased mobility, Impaired flexibility  Visit Diagnosis: Difficulty in walking, not elsewhere classified     Problem List Patient Active Problem List   Diagnosis Date Noted  . Broken tooth, complicated 01/23/2016  . Hypertension 01/07/2016  . Total knee replacement status 01/01/2016  . Primary osteoarthritis of right knee 05/26/2015   [name], PT, DPT [place], Barkley Bruns S 02/03/2016, 8:45 AM  [place] [facility] MAIN [facility] SERVICES [address] [place], [place], [address] Phone: [phone]   Fax:  [phone]  Name: Walter Hodges MRN: [id number] Date of Birth: May 30, 1956

## 2016-02-04 ENCOUNTER — Telehealth: Payer: Self-pay | Admitting: Family Medicine

## 2016-02-04 NOTE — Telephone Encounter (Signed)
Called pt. Pt verified name and date of birth. Pt notified he needs to make an appt with Dr.Amao to discuss his opthamology visit and additional workup requested by them. Pt voiced understanding and was transferred to the front to schedule appt.

## 2016-02-04 NOTE — Telephone Encounter (Signed)
Please inform the patient to schedule an office visit with me so we can discuss his ophthalmology visit and additional workup requested by his ophthalmologist. Thank you

## 2016-02-05 ENCOUNTER — Encounter: Payer: Self-pay | Admitting: Physical Therapy

## 2016-02-05 ENCOUNTER — Ambulatory Visit: Payer: Medicaid Other | Attending: Orthopaedic Surgery | Admitting: Physical Therapy

## 2016-02-05 DIAGNOSIS — R262 Difficulty in walking, not elsewhere classified: Secondary | ICD-10-CM | POA: Insufficient documentation

## 2016-02-05 DIAGNOSIS — M25561 Pain in right knee: Secondary | ICD-10-CM | POA: Insufficient documentation

## 2016-02-05 NOTE — Therapy (Signed)
[place] [facility] MAIN [facility] SERVICES [address] [place], [place], [address] Phone: [phone]   Fax:  [phone]  Physical Therapy Treatment  Patient Details  Name: Walter Hodges MRN: [id number] Date of Birth: March 09, 1956 Referring Provider: Tarry Kos  Encounter Date: 02/05/2016      PT End of Session - 02/05/16 0850    Visit Number 7   Number of Visits 4   Authorization Type medicaid   PT Start Time 0835   PT Stop Time 0910   PT Time Calculation (min) 35 min   Activity Tolerance Patient limited by pain   Behavior During Therapy [facility] for tasks assessed/performed      Past Medical History  Diagnosis Date  . Bell's palsy 2010    lasted one day ago, told that he had a ministroke  . Hypertension     followed by Dr. Nita Sells" with [facility]    . Arthritis   . Headache     Past Surgical History  Procedure Laterality Date  . Circumcision      done as a child- given inhalation   . Total knee arthroplasty Right 05/26/2015    Procedure: RIGHT TOTAL KNEE ARTHROPLASTY;  Surgeon: Tarry Kos, MD;  Location: [facility] OR;  Service: Orthopedics;  Laterality: Right;  . Total knee arthroplasty Left 01/01/2016    Procedure: LEFT TOTAL KNEE ARTHROPLASTY;  Surgeon: Tarry Kos, MD;  Location: [facility] OR;  Service: Orthopedics;  Laterality: Left;    There were no vitals filed for this visit.      Subjective Assessment - 02/05/16 0848    Subjective Patient has been doing his stretches and HEP.    Currently in Pain? Yes   Pain Score 6    Pain Location Knee   Pain Onset 1 to 4 weeks ago     Therapeutic exercise; Knee flex with RTB x 20 x 2 LAQ x 20 SAQ x 20 Quad sets in supine with 3 sec hold x 20 SLR x 10 x 2 PROM -5 to 98 deg left knee  Patient continues to have high pain with exercises.                             PT Education - 02/05/16 0849    Education provided Yes   Education Details stretching and HEP    Person(s) Educated Patient   Methods Explanation   Comprehension Verbalized understanding             PT Long Term Goals - 01/07/16 1000    PT LONG TERM GOAL #1   Title Patient will be independent in home exercise program to improve strength/mobility for better functional independence with ADLs  03/03/16   Time 8   Period Weeks   Status New   PT LONG TERM GOAL #2   Title Patient (> 22 years old) will complete five times sit to stand test in < 15 seconds indicating an increased LE strength and improved balance.  03/03/16   Time 8   Period Weeks   Status New   PT LONG TERM GOAL #3   Title Patient will increase six minute walk test distance to >1000 for progression to community ambulator and improve gait ability 03/03/16   Time 8   Period Weeks   Status New   PT LONG TERM GOAL #4   Title Patient will increase 10 meter walk test to >1.78m/s as to  improve gait speed for better community ambulation and to reduce fall risk 03/03/16   Time 8   Period Weeks   Status New               Plan - 02/05/16 0851    Clinical Impression Statement Patient has stiffness and pain in left knee. He is able to perform his TKR exercises with pain behaviors.    Rehab Potential Good   PT Frequency 3x / week   PT Duration 8 weeks   PT Treatment/Interventions Passive range of motion;Scar mobilization;Manual techniques;Therapeutic exercise;Therapeutic activities;Functional mobility training;Stair training;Gait training;Electrical Stimulation;Moist Heat;Cryotherapy   PT Next Visit Plan theraeputic exercise   PT Home Exercise Plan ankle pumps , knee fllex,    Consulted and Agree with Plan of Care Patient      Patient will benefit from skilled therapeutic intervention in order to improve the following deficits and impairments:  Decreased endurance, Increased edema, Decreased activity tolerance, Decreased strength, Pain, Difficulty walking, Decreased mobility, Impaired flexibility  Visit  Diagnosis: Difficulty in walking, not elsewhere classified     Problem List Patient Active Problem List   Diagnosis Date Noted  . Broken tooth, complicated 01/23/2016  . Hypertension 01/07/2016  . Total knee replacement status 01/01/2016  . Primary osteoarthritis of right knee 05/26/2015  [name], PT, DPT  [place] S 02/05/2016, 8:55 AM  [place] [facility] MAIN [facility] SERVICES [address] [place], [place], [address] Phone: [phone]   Fax:  [phone]  Name: Walter Hodges MRN: [id number] Date of Birth: 1956-06-04

## 2016-02-09 ENCOUNTER — Ambulatory Visit: Payer: Medicaid Other | Admitting: Physical Therapy

## 2016-02-10 ENCOUNTER — Encounter: Payer: Medicaid Other | Admitting: Physical Therapy

## 2016-02-12 ENCOUNTER — Ambulatory Visit: Payer: Medicaid Other | Admitting: Physical Therapy

## 2016-02-12 DIAGNOSIS — R262 Difficulty in walking, not elsewhere classified: Secondary | ICD-10-CM | POA: Diagnosis not present

## 2016-02-12 NOTE — Therapy (Signed)
[facility] [address] [place], [place], [address] Phone: [phone]   Fax:  [phone]  Physical Therapy Treatment/ Progress Note  Patient Details  Name: Walter Hodges MRN: [id number] Date of Birth: December 03, 1955 Referring Provider: Tarry KosXU, NAIPING M  Encounter Date: 02/12/2016      PT End of Session - 02/12/16 0850    Visit Number 8   Number of Visits 5   Authorization Type medicaid   PT Start Time [phone]   PT Stop Time 0915   PT Time Calculation (min) 40 min   Activity Tolerance Patient limited by pain   Behavior During Therapy [facility] for tasks assessed/performed      Past Medical History  Diagnosis Date  . Bell's palsy 2010    lasted one day ago, told that he had a ministroke  . Hypertension     followed by Dr. Nita Sells"maryann" with [facility]    . Arthritis   . Headache     Past Surgical History  Procedure Laterality Date  . Circumcision      done as a child- given inhalation   . Total knee arthroplasty Right 05/26/2015    Procedure: RIGHT TOTAL KNEE ARTHROPLASTY;  Surgeon: Tarry KosNaiping M Xu, MD;  Location: [facility] OR;  Service: Orthopedics;  Laterality: Right;  . Total knee arthroplasty Left 01/01/2016    Procedure: LEFT TOTAL KNEE ARTHROPLASTY;  Surgeon: Tarry KosNaiping M Xu, MD;  Location: [facility] OR;  Service: Orthopedics;  Laterality: Left;    There were no vitals filed for this visit.      Subjective Assessment - 02/12/16 0840    Subjective Patient has been doing his stretches and HEP.    Currently in Pain? Yes   Pain Score 6    Pain Location Knee   Pain Orientation Left   Pain Descriptors / Indicators Aching   Pain Onset 1 to 4 weeks ago        Therapeutic exercise; Nu-step x 5 mins , then attempted octane with patient unable to perform and high pain level 10/10, then 5 mins of nu-step to bring the pain level back down to 6/10 Seated knee flex with RTB x 20  Seated knee flex stretch x 5 with 5 sec hold  PROM left knee  -5 to 93 deg flex Quad sets with 3 sec hold SLR x 10 x 2 SAQ x 20    Patient has high level of pain with PROM and it limits his knee ROM. He continues to have slow gait speed with spc. Treatment is followed by ice to left knee.  Patient missed a PT apt this week and missed a week earlier this month due to needing medicaid approval.                          PT Education - 02/12/16 0844    Education provided Yes   Education Details HEP   Person(s) Educated Patient   Methods Explanation   Comprehension Verbalized understanding             PT Long Term Goals - 02/12/16 0912    PT LONG TERM GOAL #1   Title Patient will be independent in home exercise program to improve strength/mobility for better functional independence with ADLs  03/03/16   Time 8   Period Weeks   Status Achieved   PT LONG TERM GOAL #2   Title Patient (> 61 years old) will complete five  times sit to stand test in < 15 seconds indicating an increased LE strength and improved balance.  03/03/16   Time 8   Period Weeks   Status On-going   PT LONG TERM GOAL #3   Title Patient will increase six minute walk test distance to >1000 for progression to community ambulator and improve gait ability 03/03/16   Time 8   Period Weeks   Status On-going   PT LONG TERM GOAL #4   Title Patient will increase 10 meter walk test to >1.22m/s as to improve gait speed for better community ambulation and to reduce fall risk 03/03/16   Time 8   Period Weeks   Status On-going   PT LONG TERM GOAL #5   Title Patient will ascend/descend 4 stairs without rail assist independently without loss of balance to improve ability to get in/out of home. 08/12/15   Status On-going               Plan - 02/12/16 0850    Clinical Impression Statement Patient is limited by pain and stiffness in left knee and has slow gait speed with spc. He is slowly improving with his ROM to left knee.    Rehab Potential Good   PT Frequency  3x / week   PT Duration 8 weeks   PT Treatment/Interventions Passive range of motion;Scar mobilization;Manual techniques;Therapeutic exercise;Therapeutic activities;Functional mobility training;Stair training;Gait training;Electrical Stimulation;Moist Heat;Cryotherapy   PT Next Visit Plan theraeputic exercise   PT Home Exercise Plan ankle pumps , knee fllex,    Consulted and Agree with Plan of Care Patient      Patient will benefit from skilled therapeutic intervention in order to improve the following deficits and impairments:     Visit Diagnosis: Difficulty in walking, not elsewhere classified     Problem List Patient Active Problem List   Diagnosis Date Noted  . Broken tooth, complicated 01/23/2016  . Hypertension 01/07/2016  . Total knee replacement status 01/01/2016  . Primary osteoarthritis of right knee 05/26/2015   [name], PT, DPT [place], Barkley Bruns S 02/12/2016, 9:15 AM  [place] [facility] MAIN [facility] SERVICES [address] [place], [place], [address] Phone: [phone]   Fax:  [phone]  Name: Walter Hodges MRN: [id number] Date of Birth: 01-08-1956

## 2016-02-16 ENCOUNTER — Encounter: Payer: Self-pay | Admitting: Family Medicine

## 2016-02-16 ENCOUNTER — Ambulatory Visit: Payer: Medicaid Other | Attending: Family Medicine | Admitting: Family Medicine

## 2016-02-16 ENCOUNTER — Ambulatory Visit: Payer: Medicaid Other | Admitting: Physical Therapy

## 2016-02-16 VITALS — BP 147/94 | HR 77 | Temp 98.0°F | Ht 70.0 in | Wt 238.6 lb

## 2016-02-16 DIAGNOSIS — I10 Essential (primary) hypertension: Secondary | ICD-10-CM

## 2016-02-16 DIAGNOSIS — R262 Difficulty in walking, not elsewhere classified: Secondary | ICD-10-CM | POA: Diagnosis not present

## 2016-02-16 DIAGNOSIS — M25561 Pain in right knee: Secondary | ICD-10-CM

## 2016-02-16 DIAGNOSIS — Z96652 Presence of left artificial knee joint: Secondary | ICD-10-CM

## 2016-02-16 DIAGNOSIS — G4700 Insomnia, unspecified: Secondary | ICD-10-CM | POA: Diagnosis not present

## 2016-02-16 DIAGNOSIS — H34211 Partial retinal artery occlusion, right eye: Secondary | ICD-10-CM | POA: Insufficient documentation

## 2016-02-16 MED ORDER — ZOLPIDEM TARTRATE 5 MG PO TABS: 5.0000 mg | ORAL_TABLET | Freq: Every evening | ORAL | Status: DC | PRN

## 2016-02-16 NOTE — Progress Notes (Signed)
Subjective:  Patient ID: Walter Hodges, male    DOB: 12-Jul-1956  Age: 60 y.o. MRN: [id number]  CC: discuss opthalmologu and Remus Loffler   HPI DAJOHN ELLENDER is a 60 year old male with a history of hypertension, insomnia, bilateral knee osteoarthritis (status post right knee replacement in 06/2015 and left knee replacement in 01/2016) who comes into the clinic after an ophthalmology evaluation with findings of [place] plaque in his right eye and recommendations for carotid ultrasound and echocardiogram.  He continues to have pain in his left knee and is undergoing rehabilitation at this time; receives OxyContin from his orthopedics. Takes OTC MiraLAX for constipation.  He does have insomnia and remains on  but informs me he is supposed to be on 10 mg which he took as needed prior to his surgery but has needed this routinely after his surgery. He still has some insomnia with the 5 mg coupled with the fact that he was relieved with the loss of his wife 6 months ago. BP is slightly elevated but he is in pain.  Outpatient Prescriptions Prior to Visit  Medication Sig Dispense Refill  . aspirin EC 325 MG tablet Take 1 tablet (325 mg total) by mouth 2 (two) times daily. 84 tablet 0  . hydrochlorothiazide (HYDRODIURIL) 25 MG tablet Take 1 tablet (25 mg total) by mouth daily. 30 tablet 2  . ibuprofen (ADVIL,MOTRIN) 800 MG tablet Take 800 mg by mouth every 8 (eight) hours as needed.    Marland Kitchen lisinopril (PRINIVIL,ZESTRIL) 40 MG tablet Take 1 tablet (40 mg total) by mouth daily. 30 tablet 2  . zolpidem (AMBIEN) 5 MG tablet Take 1 tablet (5 mg total) by mouth at bedtime as needed for sleep. 30 tablet 0  . methocarbamol (ROBAXIN) 750 MG tablet Take 1 tablet (750 mg total) by mouth 2 (two) times daily as needed for muscle spasms. (Patient not taking: Reported on 02/16/2016) 60 tablet 0  . ondansetron (ZOFRAN) 4 MG tablet Take 1-2 tablets (4-8 mg total) by mouth every 8 (eight) hours as needed for nausea or  vomiting. (Patient not taking: Reported on 02/16/2016) 40 tablet 0  . oxyCODONE (OXY IR/ROXICODONE) 5 MG immediate release tablet Take 1-3 tablets (5-15 mg total) by mouth every 4 (four) hours as needed. (Patient not taking: Reported on 02/16/2016) 90 tablet 0  . oxyCODONE (OXYCONTIN) 10 mg 12 hr tablet Take 1 tablet (10 mg total) by mouth every 12 (twelve) hours. (Patient not taking: Reported on 02/16/2016) 10 tablet 0  . senna-docusate (SENOKOT S) 8.6-50 MG tablet Take 1 tablet by mouth at bedtime as needed. (Patient not taking: Reported on 02/16/2016) 30 tablet 1  . oxyCODONE (OXY IR/ROXICODONE) 5 MG immediate release tablet Take 1-3 tablets (5-15 mg total) by mouth every 4 (four) hours as needed. 90 tablet 0  . OxyCODONE (OXYCONTIN) 10 mg T12A 12 hr tablet Take 1 tablet (10 mg total) by mouth every 12 (twelve) hours. 10 tablet 0   No facility-administered medications prior to visit.    ROS Review of Systems  Constitutional: Negative for activity change and appetite change.  HENT: Negative for sinus pressure and sore throat.   Eyes: Negative for visual disturbance.  Respiratory: Negative for cough, chest tightness and shortness of breath.   Cardiovascular: Negative for chest pain and leg swelling.  Gastrointestinal: Negative for abdominal pain, diarrhea, constipation and abdominal distention.  Endocrine: Negative.   Genitourinary: Negative for dysuria.  Musculoskeletal: Negative for myalgias and joint swelling.  Skin: Negative for rash.  Allergic/Immunologic: Negative.   Neurological: Negative for weakness, light-headedness and numbness.  Psychiatric/Behavioral: Negative for suicidal ideas and dysphoric mood.    Objective:  BP 147/94 mmHg  Pulse 77  Temp(Src) 98 F (36.7 C)  Ht 5\' 10"  (1.778 m)  Wt 238 lb 9.6 oz (108.228 kg)  BMI 34.24 kg/m2  BP/Weight 02/16/2016 01/07/2016 12/29/2015  Systolic BP 147 148 135  Diastolic BP 94 87 73  Wt. (Lbs) 238.6 239.6 244  BMI 34.24 34.38 35.01        Physical Exam Constitutional: He is oriented to person, place, and time. He appears well-developed and well-nourished.  Cardiovascular: Normal rate, normal heart sounds and intact distal pulses.   No murmur heard. Pulmonary/Chest: Effort normal and breath sounds normal. He has no wheezes. He has no rales. He exhibits no tenderness.  Abdominal: Soft. Bowel sounds are normal. He exhibits no distension and no mass. There is no tenderness.  Musculoskeletal:  Right knee vertical healed surgical scar Left knee with vertical surgical scar, severe tenderness on palpation and range of motion.  Neurological: He is alert and oriented to person, place, and time.     Assessment & Plan:   1. Status post total left knee replacement Currently undergoing rehabilitation  2. Insomnia He previously took Ambien as needed prior to his surgery We will hopefully taper him off this once the acute phase of pain is over. Advised of the habit-forming nature and tolerance and he is willing to remain on the 5 mg. - zolpidem (AMBIEN) 5 MG tablet; Take 1 tablet (5 mg total) by mouth at bedtime as needed for sleep.  Dispense: 30 tablet; Refill: 2  3. [place] plaque, right eye Will refer as per ophthalmology recommendations - ECHO TEE; Future - VAS US CAROTID; Future  4. Essential hypertension Slight BP elevation due to pain. Low sodium diet Monitor Bp at home - Lipid Panel; Future   Meds ordered this encounter  Medications  . zolpidem (AMBIEN) 5 MG tablet    Sig: Take 1 tablet (5 mg total) by mouth at bedtime as needed for sleep.    Dispense:  30 tablet    Refill:  2    Follow-up: Return in about 2 months (around 04/17/2016) for follow up on Hypertension.   Jaclyn ShaggyEnobong Amao MD

## 2016-02-16 NOTE — Therapy (Signed)
[facility] MAIN [facility] SERVICES [address] [place], [place], [address] Phone: [phone]   Fax:  [phone]  Physical Therapy Treatment  Patient Details  Name: Walter Hodges MRN: [id number] Date of Birth: May 30, 1956 Referring Provider: Tarry Kos  Encounter Date: 02/16/2016      PT End of Session - 02/16/16 0843    Visit Number 9   Number of Visits 6   Authorization Type medicaid   PT Start Time 0830   PT Stop Time 0915   PT Time Calculation (min) 45 min   Activity Tolerance Patient limited by pain   Behavior During Therapy [facility] for tasks assessed/performed      Past Medical History  Diagnosis Date  . Bell's palsy 2010    lasted one day ago, told that he had a ministroke  . Hypertension     followed by Dr. Nita Sells" [facility]    . Arthritis   . Headache     Past Surgical History  Procedure Laterality Date  . Circumcision      done as a child- given inhalation   . Total knee arthroplasty Right 05/26/2015    Procedure: RIGHT TOTAL KNEE ARTHROPLASTY;  Surgeon: Tarry Kos, MD;  Location: [facility] OR;  Service: Orthopedics;  Laterality: Right;  . Total knee arthroplasty Left 01/01/2016    Procedure: LEFT TOTAL KNEE ARTHROPLASTY;  Surgeon: Tarry Kos, MD;  Location: [facility] OR;  Service: Orthopedics;  Laterality: Left;    There were no vitals filed for this visit.      Subjective Assessment - 02/16/16 0832    Subjective Pt reports continued stiffness and a rough weekend with pain. He is icing frequently.   Currently in Pain? Yes   Pain Score 7    Pain Location Knee   Pain Orientation Left   Pain Descriptors / Indicators Aching;Tightness   Pain Type Acute pain;Surgical pain   Pain Onset 1 to 4 weeks ago   Pain Frequency Constant      Therapeutic exercise: Nustep L0 x5 minutes for increased L knee ROM, cues to increase flexion as tolerated SAQ x 20 Quad sets in supine with heel prop with 3 sec hold x 10 SLR x 10 x  2 Seated self-knee flex stretch x 5 with 20 sec hold   Cues for proper technique and TKE to target appropriate muscles.  Manual: L patella mobs grade III and IV 2x30 each direction, less stiffness/pain L knee AP and PA mobs grade III and IV 3x30 each AAROM L knee flexion/extension with end range static stretch 5 seconds x20 PROM -5 to 78 deg left knee, post manual tx Ice applied at the end of session - no charge Pt demonstrated difficulty with ROM due to swelling and pain. Cues given for relaxation techniques.                           PT Education - 02/16/16 0837    Education provided Yes   Education Details continue icing to facilitate reduced swelling in knee   Person(s) Educated Patient   Methods Explanation   Comprehension Verbalized understanding             PT Long Term Goals - 02/12/16 0912    PT LONG TERM GOAL #1   Title Patient will be independent in home exercise program to improve strength/mobility for better functional independence with ADLs  03/03/16   Time 8  Period Weeks   Status Achieved   PT LONG TERM GOAL #2   Title Patient (> 80 years old) will complete five times sit to stand test in < 15 seconds indicating an increased LE strength and improved balance.  03/03/16   Time 8   Period Weeks   Status On-going   PT LONG TERM GOAL #3   Title Patient will increase six minute walk test distance to >1000 for progression to community ambulator and improve gait ability 03/03/16   Time 8   Period Weeks   Status On-going   PT LONG TERM GOAL #4   Title Patient will increase 10 meter walk test to >1.79m/s as to improve gait speed for better community ambulation and to reduce fall risk 03/03/16   Time 8   Period Weeks   Status On-going   PT LONG TERM GOAL #5   Title Patient will ascend/descend 4 stairs without rail assist independently without loss of balance to improve ability to get in/out of home. 08/12/15   Status On-going                Plan - 02/16/16 0914    Clinical Impression Statement Pt is highly limited in ROM by swelling and pain. He tends to guard against during PROM. Pt reported that patella mobs made it feel slightly better. He will benefit from continued manual tx, strengthening and ROM exercises.   Rehab Potential Good   PT Frequency 3x / week   PT Duration 8 weeks   PT Treatment/Interventions Passive range of motion;Scar mobilization;Manual techniques;Therapeutic exercise;Therapeutic activities;Functional mobility training;Stair training;Gait training;Electrical Stimulation;Moist Heat;Cryotherapy   PT Next Visit Plan theraeputic exercise, manual tx   PT Home Exercise Plan ankle pumps , knee fllex,    Consulted and Agree with Plan of Care Patient      Patient will benefit from skilled therapeutic intervention in order to improve the following deficits and impairments:  Decreased endurance, Increased edema, Decreased activity tolerance, Decreased strength, Pain, Difficulty walking, Decreased mobility, Impaired flexibility  Visit Diagnosis: Difficulty in walking, not elsewhere classified  Right knee pain     Problem List Patient Active Problem List   Diagnosis Date Noted  . Broken tooth, complicated 01/23/2016  . Hypertension 01/07/2016  . Total knee replacement status 01/01/2016  . Primary osteoarthritis of right knee 05/26/2015    Adelene Idler, PT, DPT  02/16/2016, 9:49 AM [phone]  [facility] MAIN [facility] SERVICES [address] [place], [place], [address] Phone: [phone]   Fax:  [phone]  Name: OTONIEL LLANAS MRN: [id number] Date of Birth: Aug 22, 1956

## 2016-02-19 ENCOUNTER — Encounter (HOSPITAL_COMMUNITY)
Admission: RE | Disposition: A | Discharge status: Home / Self Care | Payer: Self-pay | Source: Ambulatory Visit | Attending: Cardiovascular Disease

## 2016-02-19 ENCOUNTER — Ambulatory Visit (HOSPITAL_COMMUNITY)
Admission: RE | Admit: 2016-02-19 | Discharge: 2016-02-19 | Disposition: A | Discharge status: Home / Self Care | Payer: Medicaid Other | Source: Ambulatory Visit | Attending: Cardiovascular Disease | Admitting: Cardiovascular Disease

## 2016-02-19 ENCOUNTER — Encounter: Payer: Self-pay | Admitting: Physical Therapy

## 2016-02-19 ENCOUNTER — Ambulatory Visit (HOSPITAL_BASED_OUTPATIENT_CLINIC_OR_DEPARTMENT_OTHER)
Admission: RE | Admit: 2016-02-19 | Discharge: 2016-02-19 | Disposition: A | Discharge status: Home / Self Care | Payer: Medicaid Other | Source: Ambulatory Visit | Attending: Family Medicine | Admitting: Family Medicine

## 2016-02-19 ENCOUNTER — Ambulatory Visit: Payer: Medicaid Other | Admitting: Physical Therapy

## 2016-02-19 ENCOUNTER — Ambulatory Visit (HOSPITAL_BASED_OUTPATIENT_CLINIC_OR_DEPARTMENT_OTHER)
Admission: RE | Admit: 2016-02-19 | Discharge: 2016-02-19 | Disposition: A | Discharge status: Home / Self Care | Payer: Medicaid Other | Source: Ambulatory Visit | Attending: Cardiovascular Disease | Admitting: Cardiovascular Disease

## 2016-02-19 DIAGNOSIS — I351 Nonrheumatic aortic (valve) insufficiency: Secondary | ICD-10-CM

## 2016-02-19 DIAGNOSIS — I10 Essential (primary) hypertension: Secondary | ICD-10-CM | POA: Insufficient documentation

## 2016-02-19 DIAGNOSIS — H34211 Partial retinal artery occlusion, right eye: Secondary | ICD-10-CM

## 2016-02-19 DIAGNOSIS — M25561 Pain in right knee: Secondary | ICD-10-CM

## 2016-02-19 DIAGNOSIS — Z7982 Long term (current) use of aspirin: Secondary | ICD-10-CM | POA: Diagnosis not present

## 2016-02-19 DIAGNOSIS — Z96651 Presence of right artificial knee joint: Secondary | ICD-10-CM | POA: Diagnosis not present

## 2016-02-19 DIAGNOSIS — R262 Difficulty in walking, not elsewhere classified: Secondary | ICD-10-CM

## 2016-02-19 DIAGNOSIS — Q211 Atrial septal defect: Secondary | ICD-10-CM | POA: Insufficient documentation

## 2016-02-19 DIAGNOSIS — H349 Unspecified retinal vascular occlusion: Secondary | ICD-10-CM | POA: Insufficient documentation

## 2016-02-19 DIAGNOSIS — Z79899 Other long term (current) drug therapy: Secondary | ICD-10-CM | POA: Insufficient documentation

## 2016-02-19 DIAGNOSIS — I253 Aneurysm of heart: Secondary | ICD-10-CM | POA: Diagnosis not present

## 2016-02-19 HISTORY — PX: TEE WITHOUT CARDIOVERSION: SHX5443

## 2016-02-19 SURGERY — ECHOCARDIOGRAM, TRANSESOPHAGEAL
Anesthesia: Moderate Sedation

## 2016-02-19 MED ORDER — SODIUM CHLORIDE 0.9 % IV SOLN: INTRAVENOUS | Status: DC

## 2016-02-19 MED ORDER — FENTANYL CITRATE (PF) 100 MCG/2ML IJ SOLN
INTRAMUSCULAR | Status: AC
  Filled 2016-02-19: qty 2

## 2016-02-19 MED ORDER — MIDAZOLAM HCL 10 MG/2ML IJ SOLN
INTRAMUSCULAR | Status: DC | PRN
Start: 2016-02-19 — End: 2016-02-19
  Administered 2016-02-19 (×2): 2 mg via INTRAVENOUS

## 2016-02-19 MED ORDER — MIDAZOLAM HCL 5 MG/ML IJ SOLN
INTRAMUSCULAR | Status: AC
  Filled 2016-02-19: qty 2

## 2016-02-19 MED ORDER — BUTAMBEN-TETRACAINE-BENZOCAINE 2-2-14 % EX AERO
INHALATION_SPRAY | CUTANEOUS | Status: DC | PRN
  Administered 2016-02-19: 2 via TOPICAL

## 2016-02-19 MED ORDER — FENTANYL CITRATE (PF) 100 MCG/2ML IJ SOLN
INTRAMUSCULAR | Status: DC | PRN
  Administered 2016-02-19: 50 ug via INTRAVENOUS
  Administered 2016-02-19 (×2): 25 ug via INTRAVENOUS

## 2016-02-19 MED ORDER — SODIUM CHLORIDE 0.9 % IV SOLN
INTRAVENOUS | Status: DC
  Administered 2016-02-19: 500 mL via INTRAVENOUS

## 2016-02-19 NOTE — Therapy (Signed)
[place] [facility] MAIN [facility] SERVICES [address] [place], [place], [address] Phone: [phone]   Fax:  [phone]  Physical Therapy Treatment  Patient Details  Name: Walter Hodges MRN: [id number] Date of Birth: Apr 14, 1956 Referring Provider: Tarry Kos  Encounter Date: 02/19/2016      PT End of Session - 02/19/16 0849    Visit Number 10   Number of Visits 7   Authorization Type medicaid   PT Start Time 0830   PT Stop Time 0915   PT Time Calculation (min) 45 min   Activity Tolerance Patient limited by pain   Behavior During Therapy [facility] for tasks assessed/performed      Past Medical History  Diagnosis Date  . Bell's palsy 2010    lasted one day ago, told that he had a ministroke  . Hypertension     followed by Dr. Nita Sells" with [facility]    . Arthritis   . Headache     Past Surgical History  Procedure Laterality Date  . Circumcision      done as a child- given inhalation   . Total knee arthroplasty Right 05/26/2015    Procedure: RIGHT TOTAL KNEE ARTHROPLASTY;  Surgeon: Tarry Kos, MD;  Location: [facility] OR;  Service: Orthopedics;  Laterality: Right;  . Total knee arthroplasty Left 01/01/2016    Procedure: LEFT TOTAL KNEE ARTHROPLASTY;  Surgeon: Tarry Kos, MD;  Location: [facility] OR;  Service: Orthopedics;  Laterality: Left;    There were no vitals filed for this visit.      Subjective Assessment - 02/19/16 0832    Subjective Pt continues to report difficulty with swelling, stiffness and pain in the L knee. He continues to ice. He felt better after last session, but the pain and stiffness return.   Currently in Pain? Yes   Pain Score 7    Pain Location Knee   Pain Orientation Left   Pain Descriptors / Indicators Aching;Tightness   Pain Type Acute pain;Surgical pain   Pain Onset 1 to 4 weeks ago   Pain Frequency Constant      Therapeutic exercise: Nustep L0 x8 minutes for increased L knee ROM, cues to increase flexion as  tolerated, gradually moved seat closer for increased flexion (position 11 to position 9)  L LE Seated self-knee flex stretch x 5 with 30 sec hold   L LE Seated HS stretch 5x 30 second hold L LE heel prop knee extension stretch 5x 30 second hold  Cues for proper technique and TKE to target appropriate muscles, long duration stretching.  Manual: L patella mobs grade III and IV 2x30 each direction, less stiffness/pain L knee AP and PA mobs grade I and II 3x30 each to facilitate pain control  PROM -4 to 81 deg left knee, post manual tx  Ice applied at the end of session - no charge Pt demonstrated difficulty with ROM due to swelling and pain. Cues given for relaxation techniques.                            PT Education - 02/19/16 0846    Education provided Yes   Education Details added to HEP see pt instructions   Person(s) Educated Patient   Methods Explanation;Demonstration;Handout   Comprehension Verbalized understanding;Returned demonstration             PT Long Term Goals - 02/12/16 0912    PT LONG TERM GOAL #  1   Title Patient will be independent in home exercise program to improve strength/mobility for better functional independence with ADLs  03/03/16   Time 8   Period Weeks   Status Achieved   PT LONG TERM GOAL #2   Title Patient (> 28 years old) will complete five times sit to stand test in < 15 seconds indicating an increased LE strength and improved balance.  03/03/16   Time 8   Period Weeks   Status On-going   PT LONG TERM GOAL #3   Title Patient will increase six minute walk test distance to >1000 for progression to community ambulator and improve gait ability 03/03/16   Time 8   Period Weeks   Status On-going   PT LONG TERM GOAL #4   Title Patient will increase 10 meter walk test to >1.88m/s as to improve gait speed for better community ambulation and to reduce fall risk 03/03/16   Time 8   Period Weeks   Status On-going   PT LONG TERM  GOAL #5   Title Patient will ascend/descend 4 stairs without rail assist independently without loss of balance to improve ability to get in/out of home. 08/12/15   Status On-going               Plan - 02/19/16 0924    Clinical Impression Statement Pt continues to be limited by swelling, pain and stiffness. AAROM -4 to 81 degrees L knee. Discussed benefits of long duration stretching. He wlll benefit from continued manual tx, strengthening and ROM exercises.   Rehab Potential Good   PT Frequency 3x / week   PT Duration 8 weeks   PT Treatment/Interventions Passive range of motion;Scar mobilization;Manual techniques;Therapeutic exercise;Therapeutic activities;Functional mobility training;Stair training;Gait training;Electrical Stimulation;Moist Heat;Cryotherapy   PT Next Visit Plan theraeputic exercise, manual tx   PT Home Exercise Plan ankle pumps , knee fllex, HS stretch, seated knee flex stretch, heel prop knee ext stretch   Consulted and Agree with Plan of Care Patient      Patient will benefit from skilled therapeutic intervention in order to improve the following deficits and impairments:  Decreased endurance, Increased edema, Decreased activity tolerance, Decreased strength, Pain, Difficulty walking, Decreased mobility, Impaired flexibility  Visit Diagnosis: Difficulty in walking, not elsewhere classified  Right knee pain     Problem List Patient Active Problem List   Diagnosis Date Noted  . Insomnia 02/16/2016  . Hollenhorst plaque, right eye 02/16/2016  . Broken tooth, complicated 01/23/2016  . Hypertension 01/07/2016  . Total knee replacement status 01/01/2016  . Primary osteoarthritis of right knee 05/26/2015    Adelene Idler, PT, DPT  02/19/2016, 9:33 AM [phone]  [facility] MAIN [facility] SERVICES [address] [place], [place], [address] Phone: [phone]   Fax:  [phone]  Name: WIATT HARKRADER MRN:  [id number] Date of Birth: 04/27/1956

## 2016-02-19 NOTE — Progress Notes (Signed)
*  PRELIMINARY RESULTS* Vascular Ultrasound Carotid Duplex (Doppler) has been completed.   There is no obvious evidence of hemodynamically significant internal carotid artery stenosis bilaterally. Vertebral arteries are patent with antegrade flow.  02/19/2016 1:35 PM Gertie FeyMichelle Anely Spiewak, RVT, RDCS, RDMS

## 2016-02-19 NOTE — Op Note (Signed)
INDICATIONS: retinal artery embolism  PROCEDURE:   Informed consent was obtained prior to the procedure. The risks, benefits and alternatives for the procedure were discussed and the patient comprehended these risks.  Risks include, but are not limited to, cough, sore throat, vomiting, nausea, somnolence, esophageal and stomach trauma or perforation, bleeding, low blood pressure, aspiration, pneumonia, infection, trauma to the teeth and death.    After a procedural time-out, the oropharynx was anesthetized with 20% benzocaine spray.   During this procedure the patient was administered a total of Versed 5 mg and Fentanyl 100 mcg to achieve and maintain moderate conscious sedation.  The patient's heart rate, blood pressure, and oxygen saturationweare monitored continuously during the procedure. The period of conscious sedation was 11 minutes, of which I was present face-to-face 100% of this time.  The transesophageal probe was inserted in the esophagus and stomach without difficulty and multiple views were obtained.  The patient was kept under observation until the patient left the procedure room.  The patient left the procedure room in stable condition.   Agitated microbubble saline contrast was administered.  COMPLICATIONS:    There were no immediate complications.  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 There is predominantly left to right shunt with a small right to left shunt following provocative maneuvers. There is minimal aortic atherosclerosis. Mildly dilated left atrium. Otherwise normal study.  RECOMMENDATIONS:    The findings could explain a paradoxical embolism with thrombus, but not a Hollenhorst plaque embolism   Time Spent Directly with the Patient:  45 minutes   Walter Hodges 02/19/2016, 11:34 AM

## 2016-02-19 NOTE — Patient Instructions (Addendum)
Provided HEP: seated self knee flexion stretch, seated HS stretch, knee extension stretch. Ice pack recipe provided. Created on [email or web address]

## 2016-02-19 NOTE — H&P (View-Only) (Signed)
 Subjective:  Patient ID: Walter Hodges, male    DOB: 10/08/1955  Age: 60 y.o. MRN: [id number]  CC: discuss opthalmologu and ambien   HPI Walter Hodges is a 60-year-old male with a history of hypertension, insomnia, bilateral knee osteoarthritis (status post right knee replacement in 06/2015 and left knee replacement in 01/2016) who comes into the clinic after an ophthalmology evaluation with findings of [place] plaque in his right eye and recommendations for carotid ultrasound and echocardiogram.  He continues to have pain in his left knee and is undergoing rehabilitation at this time; receives OxyContin from his orthopedics. Takes OTC MiraLAX for constipation.  He does have insomnia and remains on 5mg but informs me he is supposed to be on 10 mg which he took as needed prior to his surgery but has needed this routinely after his surgery. He still has some insomnia with the 5 mg coupled with the fact that he was relieved with the loss of his wife 6 months ago. BP is slightly elevated but he is in pain.  Outpatient Prescriptions Prior to Visit  Medication Sig Dispense Refill  . aspirin EC 325 MG tablet Take 1 tablet (325 mg total) by mouth 2 (two) times daily. 84 tablet 0  . hydrochlorothiazide (HYDRODIURIL) 25 MG tablet Take 1 tablet (25 mg total) by mouth daily. 30 tablet 2  . ibuprofen (ADVIL,MOTRIN) 800 MG tablet Take 800 mg by mouth every 8 (eight) hours as needed.    . lisinopril (PRINIVIL,ZESTRIL) 40 MG tablet Take 1 tablet (40 mg total) by mouth daily. 30 tablet 2  . zolpidem (AMBIEN) 5 MG tablet Take 1 tablet (5 mg total) by mouth at bedtime as needed for sleep. 30 tablet 0  . methocarbamol (ROBAXIN) 750 MG tablet Take 1 tablet (750 mg total) by mouth 2 (two) times daily as needed for muscle spasms. (Patient not taking: Reported on 02/16/2016) 60 tablet 0  . ondansetron (ZOFRAN) 4 MG tablet Take 1-2 tablets (4-8 mg total) by mouth every 8 (eight) hours as needed for nausea or  vomiting. (Patient not taking: Reported on 02/16/2016) 40 tablet 0  . oxyCODONE (OXY IR/ROXICODONE) 5 MG immediate release tablet Take 1-3 tablets (5-15 mg total) by mouth every 4 (four) hours as needed. (Patient not taking: Reported on 02/16/2016) 90 tablet 0  . oxyCODONE (OXYCONTIN) 10 mg 12 hr tablet Take 1 tablet (10 mg total) by mouth every 12 (twelve) hours. (Patient not taking: Reported on 02/16/2016) 10 tablet 0  . senna-docusate (SENOKOT S) 8.6-50 MG tablet Take 1 tablet by mouth at bedtime as needed. (Patient not taking: Reported on 02/16/2016) 30 tablet 1  . oxyCODONE (OXY IR/ROXICODONE) 5 MG immediate release tablet Take 1-3 tablets (5-15 mg total) by mouth every 4 (four) hours as needed. 90 tablet 0  . OxyCODONE (OXYCONTIN) 10 mg T12A 12 hr tablet Take 1 tablet (10 mg total) by mouth every 12 (twelve) hours. 10 tablet 0   No facility-administered medications prior to visit.    ROS Review of Systems  Constitutional: Negative for activity change and appetite change.  HENT: Negative for sinus pressure and sore throat.   Eyes: Negative for visual disturbance.  Respiratory: Negative for cough, chest tightness and shortness of breath.   Cardiovascular: Negative for chest pain and leg swelling.  Gastrointestinal: Negative for abdominal pain, diarrhea, constipation and abdominal distention.  Endocrine: Negative.   Genitourinary: Negative for dysuria.  Musculoskeletal: Negative for myalgias and joint swelling.  Skin: Negative for rash.    Allergic/Immunologic: Negative.   Neurological: Negative for weakness, light-headedness and numbness.  Psychiatric/Behavioral: Negative for suicidal ideas and dysphoric mood.    Objective:  BP 147/94 mmHg  Pulse 77  Temp(Src) 98 F (36.7 C)  Ht 5\' 10"  (1.778 m)  Wt 238 lb 9.6 oz (108.228 kg)  BMI 34.24 kg/m2  BP/Weight 02/16/2016 01/07/2016 12/29/2015  Systolic BP 147 148 135  Diastolic BP 94 87 73  Wt. (Lbs) 238.6 239.6 244  BMI 34.24 34.38 35.01        Physical Exam Constitutional: He is oriented to person, place, and time. He appears well-developed and well-nourished.  Cardiovascular: Normal rate, normal heart sounds and intact distal pulses.   No murmur heard. Pulmonary/Chest: Effort normal and breath sounds normal. He has no wheezes. He has no rales. He exhibits no tenderness.  Abdominal: Soft. Bowel sounds are normal. He exhibits no distension and no mass. There is no tenderness.  Musculoskeletal:  Right knee vertical healed surgical scar Left knee with vertical surgical scar, severe tenderness on palpation and range of motion.  Neurological: He is alert and oriented to person, place, and time.     Assessment & Plan:   1. Status post total left knee replacement Currently undergoing rehabilitation  2. Insomnia He previously took Ambien as needed prior to his surgery We will hopefully taper him off this once the acute phase of pain is over. Advised of the habit-forming nature and tolerance and he is willing to remain on the 5 mg. - zolpidem (AMBIEN) 5 MG tablet; Take 1 tablet (5 mg total) by mouth at bedtime as needed for sleep.  Dispense: 30 tablet; Refill: 2  3. Hollenhorst plaque, right eye Will refer as per ophthalmology recommendations - ECHO TEE; Future - VAS US CAROTID; Future  4. Essential hypertension Slight BP elevation due to pain. Low sodium diet Monitor Bp at home - Lipid Panel; Future   Meds ordered this encounter  Medications  . zolpidem (AMBIEN) 5 MG tablet    Sig: Take 1 tablet (5 mg total) by mouth at bedtime as needed for sleep.    Dispense:  30 tablet    Refill:  2    Follow-up: Return in about 2 months (around 04/17/2016) for follow up on Hypertension.   Jaclyn ShaggyEnobong Amao MD

## 2016-02-19 NOTE — Discharge Instructions (Signed)

## 2016-02-19 NOTE — Interval H&P Note (Signed)
History and Physical Interval Note:  02/19/2016 10:47 AM  Walter Hodges  has presented today for surgery, with the diagnosis of retinal artery embolism.  The various methods of treatment have been discussed with the patient and family. After consideration of risks, benefits and other options for treatment, the patient has consented to  Procedure(s): TRANSESOPHAGEAL ECHOCARDIOGRAM (TEE) (N/A) as a surgical intervention .  The patient's history has been reviewed, patient examined, no change in status, stable for surgery.  I have reviewed the patient's chart and labs.  Questions were answered to the patient's satisfaction.     Alizabeth Antonio

## 2016-02-20 ENCOUNTER — Telehealth: Payer: Self-pay | Admitting: Family Medicine

## 2016-02-20 ENCOUNTER — Telehealth: Payer: Self-pay | Admitting: *Deleted

## 2016-02-20 NOTE — Telephone Encounter (Signed)
Procedure report forwarded to Dr. Dione BoozeGroat. Please tell him I did describe the results to him, but the sedatives were still making him sleepy and I am not surprised that he forgot. We can make him a follow-up appt if he wishes to discuss face to face. I will be glad to see him, or offer follow-up with one of our [employer] associates ([place], [place], [place]) if more convenient.

## 2016-02-20 NOTE — Telephone Encounter (Signed)
Received call sent directly to my phone - was away from desk and this went to VM.  Voice mail received had very bad reception - could not make out most of the message. Did confirm partial DOB, first and partial last name, and phone number for return call. Phone number search produced this patient's chart as only result. Pt calling about "procedure done yesterday by Dr." - assume TEE cardioversion by Dr. Royann Shiversroitoru.  With assumption pt has questions or inquiry regarding procedure, I called the individual back. Went to voice mail, left msg to call office.

## 2016-02-20 NOTE — Telephone Encounter (Signed)
Received call from patient, this was another direct transfer call while I was away from desk. He left msg for me to call.  I returned patient call, went to his VM - left him a msg requesting call back and to have operator transfer to triage RN. --------------------------------------------------------------------------------- Operator: Please notify me or other triage RN when Mr. Walter Hodges calls and put him through to speak with someone.

## 2016-02-20 NOTE — Telephone Encounter (Signed)
Spoke to patient.  Had TEE yesterday by Dr. Royann Shiversroitoru He's trying to find out what transpired during TEE yesterday, and what the significance of the tests were. Noted the blood clot in eye and wanted to know what diagnosis was and when/if he needs to follow up. He was referred for the TEE study by Dr. Dione BoozeGroat for concern of possible Hollenhorst plaque  Also, does the patient need to see us for cardiology follow up? He thinks Dr. Dione BoozeGroat will need copies of the procedure notes - told him I am happy to get in touch with their office and provide this.

## 2016-02-20 NOTE — Telephone Encounter (Signed)
Called patient. Reached voicemail. Left message for patient to return call at [phone]. 

## 2016-02-20 NOTE — Telephone Encounter (Signed)
-----   Message from Jaclyn ShaggyEnobong Amao, MD sent at 02/20/2016  1:55 PM EDT ----- Normal carotid Doppler.

## 2016-02-23 ENCOUNTER — Encounter: Payer: Medicaid Other | Admitting: Physical Therapy

## 2016-02-23 ENCOUNTER — Encounter (HOSPITAL_COMMUNITY): Payer: Self-pay | Admitting: Cardiovascular Disease

## 2016-02-23 NOTE — Telephone Encounter (Signed)
Patient returned phone call. Please f/u  °

## 2016-02-23 NOTE — Telephone Encounter (Signed)
Left message for pt to call.

## 2016-02-23 NOTE — Telephone Encounter (Signed)
Spoke with pt, Follow up scheduled with dr croitoru.

## 2016-02-23 NOTE — Telephone Encounter (Signed)
Follow-up ° ° ° ° °The pt is returning the nurses call °

## 2016-02-24 NOTE — Telephone Encounter (Signed)
Pt. Returned call. Please f/u with pt. °

## 2016-02-25 ENCOUNTER — Ambulatory Visit: Payer: Medicaid Other | Admitting: Physical Therapy

## 2016-02-25 ENCOUNTER — Encounter: Payer: Self-pay | Admitting: Physical Therapy

## 2016-02-25 DIAGNOSIS — R262 Difficulty in walking, not elsewhere classified: Secondary | ICD-10-CM | POA: Diagnosis not present

## 2016-02-25 NOTE — Telephone Encounter (Signed)
Writer reached patient today to let him know that his carotid doppler came back normal.  Patient was very grateful for these results.  Patient was wondering why he continues to "have the white spots in his eyes" and was curious if MD had an answer.  He is requesting a call back either way.

## 2016-02-25 NOTE — Therapy (Signed)
[facility] MAIN [facility] [address] [place], [place], [address] Phone: [phone]   Fax:  [phone]  Physical Therapy Treatment/Discharge Summary  Patient Details  Name: Walter Hodges MRN: [id number] Date of Birth: 1956/04/02 Referring Provider: Tarry KosXU, NAIPING M  Encounter Date: 02/25/2016      PT End of Session - 02/25/16 1400    Visit Number 11   Number of Visits 8   Authorization Type medicaid   PT Start Time 0230   PT Stop Time 0250   PT Time Calculation (min) 20 min   Activity Tolerance Patient limited by pain   Behavior During [facility] for tasks assessed/performed      Past Medical History  Diagnosis Date  . Bell's palsy 2010    lasted one day ago, told that he had a ministroke  . Hypertension     followed by Dr. Nita Sells"maryann" with [facility]    . Arthritis   . Headache     Past Surgical History  Procedure Laterality Date  . Circumcision      done as a child- given inhalation   . Total knee arthroplasty Right 05/26/2015    Procedure: RIGHT TOTAL KNEE ARTHROPLASTY;  Surgeon: Tarry KosNaiping M Xu, MD;  Location: [facility] OR;  Service: Orthopedics;  Laterality: Right;  . Total knee arthroplasty Left 01/01/2016    Procedure: LEFT TOTAL KNEE ARTHROPLASTY;  Surgeon: Tarry KosNaiping M Xu, MD;  Location: [facility] OR;  Service: Orthopedics;  Laterality: Left;  . Tee without cardioversion N/A 02/19/2016    Procedure: TRANSESOPHAGEAL ECHOCARDIOGRAM (TEE);  Surgeon: Thurmon FairMihai Croitoru, MD;  [facility];  Service: Cardiovascular;  Laterality: N/A;    There were no vitals filed for this visit.      Subjective Assessment - 02/25/16 1400    Subjective Pt continues to report difficulty with swelling, stiffness and pain in the L knee. He continues to ice. He felt better after last session, but the pain and stiffness return.   Currently in Pain? Yes   Pain Score 7    Pain Location Knee   Pain Onset 1 to 4 weeks ago     Therapeutic  exercise: Nu-step x 7 minutes   Reviewed HEP :  knee flex seated stretch Knee flex with RTB  Hamstring stretch  SLR Patient has increased pain during exercises and says that he has been out running errands all day and has increased swelling . AROM is -5 to 75 deg left knee. Treatment followed with ice to left knee                             PT Education - 02/25/16 1400    Education provided Yes   Education Details HEP   Person(s) Educated Patient   Methods Explanation   Comprehension Verbalized understanding             PT Long Term Goals - 02/25/16 1445    PT LONG TERM GOAL #1   Title Patient will be independent in home exercise program to improve strength/mobility for better functional independence with ADLs  03/03/16   Period Weeks   Status Achieved   PT LONG TERM GOAL #2   Title Patient (> 61 years old) will complete five times sit to stand test in < 15 seconds indicating an increased LE strength and improved balance.  03/03/16   Time 8   Period Weeks   Status On-going   PT  LONG TERM GOAL #3   Title Patient will increase six minute walk test distance to >1000 for progression to community ambulator and improve gait ability 03/03/16   Time 8   Period Weeks   Status On-going   PT LONG TERM GOAL #4   Title Patient will increase 10 meter walk test to >1.64m/s as to improve gait speed for better community ambulation and to reduce fall risk 03/03/16   Time 8   Period Weeks   Status On-going   PT LONG TERM GOAL #5   Title Patient will ascend/descend 4 stairs without rail assist independently without loss of balance to improve ability to get in/out of home. 08/12/15   Status On-going               Plan - 02/25/16 1436    Clinical Impression Statement Patient continues to have pain in left knee , swelling and stiffness . Patient has limited AROM left knee and decreased strength.    Rehab Potential Good   PT Frequency 3x / week   PT Duration 8  weeks   PT Treatment/Interventions Passive range of motion;Scar mobilization;Manual techniques;Therapeutic exercise;Therapeutic activities;Functional mobility training;Stair training;Gait training;Electrical Stimulation;Moist Heat;Cryotherapy   PT Next Visit Plan theraeputic exercise, manual tx   PT Home Exercise Plan ankle pumps , knee fllex, HS stretch, seated knee flex stretch, heel prop knee ext stretch   Consulted and Agree with Plan of Care Patient      Patient will benefit from skilled therapeutic intervention in order to improve the following deficits and impairments:  Decreased endurance, Increased edema, Decreased activity tolerance, Decreased strength, Pain, Difficulty walking, Decreased mobility, Impaired flexibility  Visit Diagnosis: Difficulty in walking, not elsewhere classified     Problem List Patient Active Problem List   Diagnosis Date Noted  . Embolism involving retinal artery   . Insomnia 02/16/2016  . Hollenhorst plaque, right eye 02/16/2016  . Broken tooth, complicated 01/23/2016  . Hypertension 01/07/2016  . Total knee replacement status 01/01/2016  . Primary osteoarthritis of right knee 05/26/2015   [name], PT, DPT [place], Barkley Bruns S 02/25/2016, 2:53 PM  [facility] MAIN [facility] SERVICES [address] [place], [place], [address] Phone: [phone]   Fax:  [phone]  Name: Walter Hodges MRN: [id number] Date of Birth: 04/24/1956

## 2016-02-26 ENCOUNTER — Encounter: Payer: Medicaid Other | Admitting: Physical Therapy

## 2016-02-26 NOTE — Telephone Encounter (Signed)
Writer spoke with patient regarding f/u with Dr. Sallye Lathristopher Groat.  Phone number provided, paperwork faxed.  Patient will call and f/u with opthalmalogy.

## 2016-02-26 NOTE — Telephone Encounter (Signed)
He will need to follow up with his ophthalmologist whom we have faxed his test results to.

## 2016-03-01 ENCOUNTER — Telehealth: Payer: Self-pay | Admitting: Family Medicine

## 2016-03-01 ENCOUNTER — Encounter: Payer: Medicaid Other | Admitting: Physical Therapy

## 2016-03-01 NOTE — Telephone Encounter (Signed)
Pt. Called stating that he was seen by opthalmology and would like to speak with his  PCP regarding an OV he had today with them. Please f/u

## 2016-03-04 ENCOUNTER — Encounter: Payer: Medicaid Other | Admitting: Physical Therapy

## 2016-03-08 ENCOUNTER — Encounter: Payer: Medicaid Other | Admitting: Physical Therapy

## 2016-03-10 NOTE — Telephone Encounter (Signed)
If he needs to follow-up with me, he will need an appointment.

## 2016-03-10 NOTE — Telephone Encounter (Signed)
Patient called to f/up from opthalmology referral appt, pt states he needs to f/up with PCP please f/up

## 2016-03-11 ENCOUNTER — Other Ambulatory Visit: Payer: Self-pay | Admitting: Family Medicine

## 2016-03-11 ENCOUNTER — Encounter: Payer: Medicaid Other | Admitting: Physical Therapy

## 2016-03-11 DIAGNOSIS — I253 Aneurysm of heart: Secondary | ICD-10-CM

## 2016-03-15 ENCOUNTER — Encounter: Payer: Self-pay | Admitting: Family Medicine

## 2016-03-15 ENCOUNTER — Ambulatory Visit: Payer: Medicaid Other | Attending: Family Medicine | Admitting: Family Medicine

## 2016-03-15 ENCOUNTER — Encounter: Payer: Medicaid Other | Admitting: Physical Therapy

## 2016-03-15 VITALS — BP 157/79 | HR 63 | Temp 97.7°F | Ht 70.0 in | Wt 237.6 lb

## 2016-03-15 DIAGNOSIS — Z9889 Other specified postprocedural states: Secondary | ICD-10-CM | POA: Insufficient documentation

## 2016-03-15 DIAGNOSIS — Z7982 Long term (current) use of aspirin: Secondary | ICD-10-CM | POA: Insufficient documentation

## 2016-03-15 DIAGNOSIS — R5383 Other fatigue: Secondary | ICD-10-CM | POA: Diagnosis not present

## 2016-03-15 DIAGNOSIS — Z79899 Other long term (current) drug therapy: Secondary | ICD-10-CM | POA: Insufficient documentation

## 2016-03-15 DIAGNOSIS — H34211 Partial retinal artery occlusion, right eye: Secondary | ICD-10-CM | POA: Diagnosis not present

## 2016-03-15 DIAGNOSIS — I10 Essential (primary) hypertension: Secondary | ICD-10-CM | POA: Insufficient documentation

## 2016-03-15 LAB — CBC WITH DIFFERENTIAL/PLATELET
BASOS PCT: 0 %
Basophils Absolute: 0 cells/uL (ref 0–200)
EOS PCT: 2 %
Eosinophils Absolute: 122 cells/uL (ref 15–500)
HEMATOCRIT: 34.4 % — AB (ref 38.5–50.0)
Hemoglobin: 11.3 g/dL — ABNORMAL LOW (ref 13.2–17.1)
LYMPHS PCT: 38 %
Lymphs Abs: 2318 cells/uL (ref 850–3900)
MCH: 26.4 pg — ABNORMAL LOW (ref 27.0–33.0)
MCHC: 32.8 g/dL (ref 32.0–36.0)
MCV: 80.4 fL (ref 80.0–100.0)
MONO ABS: 549 {cells}/uL (ref 200–950)
MPV: 9.6 fL (ref 7.5–12.5)
Monocytes Relative: 9 %
NEUTROS ABS: 3111 {cells}/uL (ref 1500–7800)
Neutrophils Relative %: 51 %
Platelets: 370 10*3/uL (ref 140–400)
RBC: 4.28 MIL/uL (ref 4.20–5.80)
RDW: 15 % (ref 11.0–15.0)
WBC: 6.1 10*3/uL (ref 3.8–10.8)

## 2016-03-15 LAB — TSH: TSH: 1.24 mIU/L (ref 0.40–4.50)

## 2016-03-15 NOTE — Progress Notes (Signed)
Subjective:  Patient ID: Walter Hodges, male    DOB: January 02, 1956  Age: 60 y.o. MRN: [id number]  CC: heart problems   HPI Walter Hodges is a 60 year old male with a history of hypertension, insomnia, bilateral knee osteoarthritis (status post right knee replacement in 06/2015 and left knee replacement in 01/2016) who comes into the clinic after an ophthalmology evaluation (by Dr Dione BoozeGroat) with findings of [place] plaque in his right eye and recommendations for carotid ultrasound and echocardiogram. Carotid Doppler was negative but 2-D echo revealed a small high secundum atrial septal defect and atrial septal aneurysm. Ophthalmology is concerned regarding the need for anticoagulation with suggestions for cardiology referral which had been placed prior to this visit.  He continues to have left knee pain and swelling on his on his way to see his surgeon after his visit here.  Past Medical History  Diagnosis Date  . Bell's palsy 2010    lasted one day ago, told that he had a ministroke  . Hypertension     followed by Dr. Nita Sells"maryann" with [facility]    . Arthritis   . Headache     Past Surgical History  Procedure Laterality Date  . Circumcision      done as a child- given inhalation   . Total knee arthroplasty Right 05/26/2015    Procedure: RIGHT TOTAL KNEE ARTHROPLASTY;  Surgeon: Tarry KosNaiping M Xu, MD;  Location: [facility] OR;  Service: Orthopedics;  Laterality: Right;  . Total knee arthroplasty Left 01/01/2016    Procedure: LEFT TOTAL KNEE ARTHROPLASTY;  Surgeon: Tarry KosNaiping M Xu, MD;  Location: [facility] OR;  Service: Orthopedics;  Laterality: Left;  . Tee without cardioversion N/A 02/19/2016    Procedure: TRANSESOPHAGEAL ECHOCARDIOGRAM (TEE);  Surgeon: Thurmon FairMihai Croitoru, MD;  [facility];  Service: Cardiovascular;  Laterality: N/A;     Outpatient Prescriptions Prior to Visit  Medication Sig Dispense Refill  . aspirin EC 325 MG tablet Take 1 tablet (325 mg total) by mouth 2 (two) times daily.  84 tablet 0  . hydrochlorothiazide (HYDRODIURIL) 25 MG tablet Take 1 tablet (25 mg total) by mouth daily. 30 tablet 2  . ibuprofen (ADVIL,MOTRIN) 800 MG tablet Take 800 mg by mouth every 8 (eight) hours as needed for mild pain.     Marland Kitchen. lisinopril (PRINIVIL,ZESTRIL) 40 MG tablet Take 1 tablet (40 mg total) by mouth daily. 30 tablet 2  . zolpidem (AMBIEN) 5 MG tablet Take 1 tablet (5 mg total) by mouth at bedtime as needed for sleep. 30 tablet 2   No facility-administered medications prior to visit.    ROS Review of Systems Review of Systems  Constitutional: Negative for activity change and appetite change.  HENT: Negative for sinus pressure and sore throat.   Eyes: see HPI  Respiratory: Negative for cough, chest tightness and shortness of breath.   Cardiovascular: Negative for chest pain and leg swelling.  Gastrointestinal: Negative for abdominal pain, diarrhea, constipation and abdominal distention.  Endocrine: Negative.   Genitourinary: Negative for dysuria.  Musculoskeletal: Positive for left knee pain and swelling  Skin: Negative for rash.  Allergic/Immunologic: Negative.   Neurological: Negative for weakness, light-headedness and numbness.  Psychiatric/Behavioral: Negative for suicidal ideas and dysphoric mood.   Objective:  BP 157/79 mmHg  Pulse 63  Temp(Src) 97.7 F (36.5 C) (Oral)  Ht 5\' 10"  (1.778 m)  Wt 237 lb 9.6 oz (107.775 kg)  BMI 34.09 kg/m2  SpO2 97%  BP/Weight 03/15/2016 02/19/2016 02/16/2016  Systolic BP 157 142 147  Diastolic BP 79 82 94  Wt. (Lbs) 237.6 238.6 238.6  BMI 34.09 34.24 34.24      Physical Exam Constitutional: He is oriented to person, place, and time. He appears well-developed and well-nourished.  Cardiovascular: Normal rate, normal heart sounds and intact distal pulses.   No murmur heard. Pulmonary/Chest: Effort normal and breath sounds normal. He has no wheezes. He has no rales. He exhibits no tenderness.  Abdominal: Soft. Bowel sounds  are normal. He exhibits no distension and no mass. There is no tenderness.  Musculoskeletal:  Right knee vertical healed surgical scar Left knee with vertical surgical scar, edema and tenderness on palpation and range of motion.  Neurological: He is alert and oriented to person, place, and time.     [facility]*  *[facility]*  [address]  [place], [place] [address]  [phone]  ------------------------------------------------------------------- Transesophageal Echocardiography  Patient: Walter, Hodges MR #: [id number] Study Date: 02/19/2016 Gender: M Age: 54 Height: 177.8 cm Weight: 108.2 kg BSA: 2.35 m^2 Pt. Status: Room:  ADMITTING Thurmon Fair, MD ATTENDING Thurmon Fair, MD ORDERING Thurmon Fair, MD PERFORMING Thurmon Fair, MD REFERRING Thurmon Fair, MD SONOGRAPHER Arvil Chaco  cc:  ------------------------------------------------------------------- LV EF: 55% - 60%  ------------------------------------------------------------------- History: PMH: Retinal artery occlusion.  ------------------------------------------------------------------- Study Conclusions  - Left ventricle: The cavity size was normal. Wall thickness was  normal. Systolic function was normal. The estimated ejection  fraction was in the range of 55% to 60%. Wall motion was normal;  there were no regional wall motion abnormalities. - Aortic valve: There was mild regurgitation. - Mitral valve: No evidence of vegetation. - Left atrium: No evidence of thrombus in the atrial cavity or  appendage. - Right atrium: No evidence of thrombus in the atrial cavity or  appendage. - Atrial septum: There was a very small high secundum atrial septal  defect. Doppler showed a very small bidirectional, but   predominantly left-to-right, atrial level shunt, in the baseline  state. Echo contrast study showed a very small right-to-left  atrial level shunt, only with provocation. There was an atrial  septal aneurysm. - Tricuspid valve: No evidence of vegetation.  Impressions:  - The small atrial septal defect could be the source of a  paradoxical embolism due to thrombus. It could not be the  explanation for a Hollenhorst plaque.  Diagnostic transesophageal echocardiography. 2D and color Doppler. Birthdate: Patient birthdate: December 01, 1955. Age: Patient is 60 yr old. Sex: Gender: male. BMI: 34.2 kg/m^2. Blood pressure: 144/59 Patient status: Outpatient. Study date: Study date: 02/19/2016. Study time: 11:14 AM. Location: Endoscopy.  -------------------------------------------------------------------  ------------------------------------------------------------------- Left ventricle: The cavity size was normal. Wall thickness was normal. Systolic function was normal. The estimated ejection fraction was in the range of 55% to 60%. Wall motion was normal; there were no regional wall motion abnormalities.  ------------------------------------------------------------------- Aortic valve: Structurally normal valve. Trileaflet; normal thickness leaflets. Cusp separation was normal. Doppler: There was mild regurgitation.  ------------------------------------------------------------------- Aorta: The aorta was normal, not dilated, and non-diseased. There was no atheroma. There was no evidence for dissection. Aortic root: The aortic root was not dilated. Ascending aorta: The ascending aorta was normal in size. Aortic arch: The aortic arch was normal in size; it had minor luminal irregularities. Descending aorta: The descending aorta was normal in size.  ------------------------------------------------------------------- Mitral valve: Structurally normal  valve. Leaflet separation was normal. No evidence of vegetation. Doppler: There was trivial regurgitation.  ------------------------------------------------------------------- Left atrium: The atrium was normal in size. No evidence  of thrombus in the atrial cavity or appendage. The appendage was morphologically a left appendage, multilobulated, and of normal size. Emptying velocity was normal.  ------------------------------------------------------------------- Atrial septum: There was a very small high secundum atrial septal defect. Doppler showed a very small bidirectional, but predominantly left-to-right, atrial level shunt, in the baseline state. Echo contrast study showed a very small right-to-left atrial level shunt, only with provocation. There was an atrial septal aneurysm.  ------------------------------------------------------------------- Right ventricle: The cavity size was normal. Wall thickness was normal. Systolic function was normal.  ------------------------------------------------------------------- Pulmonic valve: Structurally normal valve.  ------------------------------------------------------------------- Tricuspid valve: Structurally normal valve. Leaflet separation was normal. No evidence of vegetation. Doppler: There was no regurgitation.  ------------------------------------------------------------------- Pulmonary artery: The main pulmonary artery was normal-sized.  ------------------------------------------------------------------- Right atrium: The atrium was normal in size. No evidence of thrombus in the atrial cavity or appendage. The appendage was morphologically a right appendage.  ------------------------------------------------------------------- Pericardium: There was no pericardial effusion.  ------------------------------------------------------------------- Post procedure conclusions Ascending  Aorta:  - The aorta was normal, not dilated, and non-diseased.  ------------------------------------------------------------------- Prepared and Electronically Authenticated by  Thurmon Fair, MD 2017-06-15T17:52:00  Assessment & Plan:   1. Hollenhorst plaque, right eye Cardiac Doppler was negative 2-D echo revealed small high secundum atrial septal defect and atrial septal aneurysm but no evidence of thrombosis. He is currently managed by Earley Brooke Referred to cardiology given concerns about ophthalmology re-: Anticoagulation.  2. Other fatigue Unknown etiology - TSH - CBC with Differential - Vitamin D, 25-hydroxy - Testosterone Total,Free,Bio, Males   No orders of the defined types were placed in this encounter.    Follow-up: Return in about 3 months (around 06/15/2016) for follow up on hypertension.   Jaclyn Shaggy MD

## 2016-03-16 ENCOUNTER — Other Ambulatory Visit: Payer: Self-pay

## 2016-03-16 ENCOUNTER — Other Ambulatory Visit: Payer: Self-pay | Admitting: Orthopaedic Surgery

## 2016-03-16 ENCOUNTER — Encounter (HOSPITAL_BASED_OUTPATIENT_CLINIC_OR_DEPARTMENT_OTHER): Payer: Self-pay | Admitting: *Deleted

## 2016-03-16 LAB — TESTOSTERONE TOTAL,FREE,BIO, MALES
ALBUMIN: 4 g/dL (ref 3.6–5.1)
Sex Hormone Binding: 62 nmol/L (ref 22–77)
TESTOSTERONE BIOAVAILABLE: 59.6 ng/dL — AB (ref 130.5–681.7)
TESTOSTERONE: 421 ng/dL (ref 250–827)
Testosterone, Free: 32.4 pg/mL — ABNORMAL LOW (ref 47.0–244.0)

## 2016-03-16 LAB — VITAMIN D 25 HYDROXY (VIT D DEFICIENCY, FRACTURES): VIT D 25 HYDROXY: 24 ng/mL — AB (ref 30–100)

## 2016-03-17 ENCOUNTER — Encounter (HOSPITAL_BASED_OUTPATIENT_CLINIC_OR_DEPARTMENT_OTHER)
Admission: RE | Admit: 2016-03-17 | Discharge: 2016-03-17 | Disposition: A | Discharge status: Home / Self Care | Payer: Medicaid Other | Source: Ambulatory Visit | Attending: Orthopaedic Surgery | Admitting: Orthopaedic Surgery

## 2016-03-17 ENCOUNTER — Other Ambulatory Visit: Payer: Self-pay | Admitting: Family Medicine

## 2016-03-17 DIAGNOSIS — M24662 Ankylosis, left knee: Secondary | ICD-10-CM | POA: Diagnosis not present

## 2016-03-17 DIAGNOSIS — I10 Essential (primary) hypertension: Secondary | ICD-10-CM | POA: Diagnosis not present

## 2016-03-17 DIAGNOSIS — D649 Anemia, unspecified: Secondary | ICD-10-CM | POA: Diagnosis not present

## 2016-03-17 DIAGNOSIS — I739 Peripheral vascular disease, unspecified: Secondary | ICD-10-CM | POA: Diagnosis not present

## 2016-03-17 DIAGNOSIS — Z7982 Long term (current) use of aspirin: Secondary | ICD-10-CM | POA: Diagnosis not present

## 2016-03-17 DIAGNOSIS — M1990 Unspecified osteoarthritis, unspecified site: Secondary | ICD-10-CM | POA: Diagnosis not present

## 2016-03-17 DIAGNOSIS — Z96652 Presence of left artificial knee joint: Secondary | ICD-10-CM | POA: Diagnosis not present

## 2016-03-17 DIAGNOSIS — R51 Headache: Secondary | ICD-10-CM | POA: Diagnosis not present

## 2016-03-17 LAB — BASIC METABOLIC PANEL
ANION GAP: 6 (ref 5–15)
BUN: 13 mg/dL (ref 6–20)
CALCIUM: 9.1 mg/dL (ref 8.9–10.3)
CO2: 28 mmol/L (ref 22–32)
CREATININE: 0.72 mg/dL (ref 0.61–1.24)
Chloride: 105 mmol/L (ref 101–111)
Glucose, Bld: 99 mg/dL (ref 65–99)
Potassium: 3.3 mmol/L — ABNORMAL LOW (ref 3.5–5.1)
SODIUM: 139 mmol/L (ref 135–145)

## 2016-03-17 MED ORDER — ERGOCALCIFEROL 1.25 MG (50000 UT) PO CAPS: 50000.0000 [IU] | ORAL_CAPSULE | ORAL | Status: DC

## 2016-03-17 MED FILL — VIT D2 1.25 MG (50,000 UNIT: 1.25 MG | 60 days supply | Qty: 9 | Fill #0

## 2016-03-18 ENCOUNTER — Encounter (HOSPITAL_BASED_OUTPATIENT_CLINIC_OR_DEPARTMENT_OTHER): Payer: Self-pay | Admitting: *Deleted

## 2016-03-18 ENCOUNTER — Encounter: Payer: Medicaid Other | Admitting: Physical Therapy

## 2016-03-18 ENCOUNTER — Telehealth: Payer: Self-pay

## 2016-03-18 MED ORDER — GLYCOPYRROLATE 0.2 MG/ML IJ SOLN: 0.2000 mg | Freq: Once | INTRAMUSCULAR | Status: DC | PRN

## 2016-03-18 MED ORDER — FENTANYL CITRATE (PF) 100 MCG/2ML IJ SOLN: 50.0000 ug | INTRAMUSCULAR | Status: DC | PRN

## 2016-03-18 MED ORDER — LACTATED RINGERS IV SOLN: INTRAVENOUS | Status: DC

## 2016-03-18 MED ORDER — MIDAZOLAM HCL 2 MG/2ML IJ SOLN: 1.0000 mg | INTRAMUSCULAR | Status: DC | PRN

## 2016-03-18 MED ORDER — SCOPOLAMINE 1 MG/3DAYS TD PT72: 1.0000 | MEDICATED_PATCH | Freq: Once | TRANSDERMAL | Status: DC | PRN

## 2016-03-18 MED FILL — LISINOPRIL 40 MG TABLET: 40 | 30 days supply | Qty: 30 | Fill #1

## 2016-03-18 NOTE — Telephone Encounter (Signed)
Called patient. Gave lab results and instructions. Patient verbalized understanding. Patient says he will be in Monday to pick up Vit D rx.

## 2016-03-18 NOTE — Telephone Encounter (Signed)
-----   Message from Jaclyn ShaggyEnobong Amao, MD sent at 03/17/2016  4:59 PM EDT ----- Thyroid levels, testosterone are normal; he is slightly anemic and vitamin D is low for which I have placed him on vitamin D replacement.

## 2016-03-19 ENCOUNTER — Encounter (HOSPITAL_COMMUNITY)
Admission: RE | Disposition: A | Discharge status: Home / Self Care | Payer: Self-pay | Source: Ambulatory Visit | Attending: Orthopaedic Surgery

## 2016-03-19 ENCOUNTER — Ambulatory Visit (HOSPITAL_COMMUNITY): Payer: Medicaid Other

## 2016-03-19 ENCOUNTER — Ambulatory Visit (HOSPITAL_BASED_OUTPATIENT_CLINIC_OR_DEPARTMENT_OTHER)
Admission: RE | Admit: 2016-03-19 | Discharge: 2016-03-20 | Disposition: A | Discharge status: Home / Self Care | Payer: Medicaid Other | Source: Ambulatory Visit | Attending: Orthopaedic Surgery | Admitting: Orthopaedic Surgery

## 2016-03-19 ENCOUNTER — Ambulatory Visit (HOSPITAL_COMMUNITY): Payer: Medicaid Other | Admitting: Anesthesiology

## 2016-03-19 ENCOUNTER — Encounter (HOSPITAL_COMMUNITY): Payer: Self-pay | Admitting: Certified Registered Nurse Anesthetist

## 2016-03-19 DIAGNOSIS — M1990 Unspecified osteoarthritis, unspecified site: Secondary | ICD-10-CM | POA: Diagnosis not present

## 2016-03-19 DIAGNOSIS — Z7982 Long term (current) use of aspirin: Secondary | ICD-10-CM | POA: Insufficient documentation

## 2016-03-19 DIAGNOSIS — M25569 Pain in unspecified knee: Secondary | ICD-10-CM | POA: Diagnosis present

## 2016-03-19 DIAGNOSIS — M24662 Ankylosis, left knee: Principal | ICD-10-CM | POA: Insufficient documentation

## 2016-03-19 DIAGNOSIS — I10 Essential (primary) hypertension: Secondary | ICD-10-CM | POA: Diagnosis not present

## 2016-03-19 DIAGNOSIS — I739 Peripheral vascular disease, unspecified: Secondary | ICD-10-CM | POA: Insufficient documentation

## 2016-03-19 DIAGNOSIS — G8929 Other chronic pain: Secondary | ICD-10-CM | POA: Diagnosis present

## 2016-03-19 DIAGNOSIS — M25562 Pain in left knee: Secondary | ICD-10-CM

## 2016-03-19 DIAGNOSIS — Z96652 Presence of left artificial knee joint: Secondary | ICD-10-CM | POA: Insufficient documentation

## 2016-03-19 DIAGNOSIS — M25561 Pain in right knee: Secondary | ICD-10-CM

## 2016-03-19 DIAGNOSIS — R51 Headache: Secondary | ICD-10-CM | POA: Insufficient documentation

## 2016-03-19 DIAGNOSIS — Z96659 Presence of unspecified artificial knee joint: Secondary | ICD-10-CM

## 2016-03-19 DIAGNOSIS — D649 Anemia, unspecified: Secondary | ICD-10-CM | POA: Insufficient documentation

## 2016-03-19 HISTORY — PX: KNEE CLOSED REDUCTION: SHX995

## 2016-03-19 HISTORY — DX: Partial retinal artery occlusion, right eye: H34.211

## 2016-03-19 SURGERY — MANIPULATION, KNEE, CLOSED
Anesthesia: General | Site: Knee | Laterality: Left

## 2016-03-19 MED ORDER — ZOLPIDEM TARTRATE 5 MG PO TABS
10.0000 mg | ORAL_TABLET | Freq: Every evening | ORAL | Status: DC | PRN
  Administered 2016-03-19: 10 mg via ORAL
  Filled 2016-03-19: qty 2

## 2016-03-19 MED ORDER — METOCLOPRAMIDE HCL 5 MG/ML IJ SOLN: 5.0000 mg | Freq: Three times a day (TID) | INTRAMUSCULAR | Status: DC | PRN

## 2016-03-19 MED ORDER — DIPHENHYDRAMINE HCL 12.5 MG/5ML PO ELIX: 25.0000 mg | ORAL_SOLUTION | ORAL | Status: DC | PRN

## 2016-03-19 MED ORDER — METHOCARBAMOL 1000 MG/10ML IJ SOLN: 500.0000 mg | Freq: Four times a day (QID) | INTRAVENOUS | Status: DC | PRN

## 2016-03-19 MED ORDER — EPHEDRINE 5 MG/ML INJ
INTRAVENOUS | Status: AC
  Filled 2016-03-19: qty 10

## 2016-03-19 MED ORDER — ACETAMINOPHEN 650 MG RE SUPP: 650.0000 mg | Freq: Four times a day (QID) | RECTAL | Status: DC | PRN

## 2016-03-19 MED ORDER — MIDAZOLAM HCL 2 MG/2ML IJ SOLN
INTRAMUSCULAR | Status: AC
  Filled 2016-03-19: qty 2

## 2016-03-19 MED ORDER — FENTANYL CITRATE (PF) 250 MCG/5ML IJ SOLN
INTRAMUSCULAR | Status: AC
  Filled 2016-03-19: qty 5

## 2016-03-19 MED ORDER — ACETAMINOPHEN 325 MG PO TABS: 325.0000 mg | ORAL_TABLET | ORAL | Status: DC | PRN

## 2016-03-19 MED ORDER — LIDOCAINE 2% (20 MG/ML) 5 ML SYRINGE
INTRAMUSCULAR | Status: AC
  Filled 2016-03-19: qty 5

## 2016-03-19 MED ORDER — OXYCODONE HCL 5 MG/5ML PO SOLN: 5.0000 mg | Freq: Once | ORAL | Status: AC | PRN

## 2016-03-19 MED ORDER — PHENYLEPHRINE 40 MCG/ML (10ML) SYRINGE FOR IV PUSH (FOR BLOOD PRESSURE SUPPORT)
PREFILLED_SYRINGE | INTRAVENOUS | Status: AC
  Filled 2016-03-19: qty 10

## 2016-03-19 MED ORDER — KETOROLAC TROMETHAMINE 30 MG/ML IJ SOLN
INTRAMUSCULAR | Status: AC
  Filled 2016-03-19: qty 1

## 2016-03-19 MED ORDER — SUCCINYLCHOLINE CHLORIDE 200 MG/10ML IV SOSY
PREFILLED_SYRINGE | INTRAVENOUS | Status: AC
  Filled 2016-03-19: qty 10

## 2016-03-19 MED ORDER — HYDROMORPHONE HCL 1 MG/ML IJ SOLN
INTRAMUSCULAR | Status: AC
  Filled 2016-03-19: qty 1

## 2016-03-19 MED ORDER — HYDROCODONE-ACETAMINOPHEN 5-325 MG PO TABS: 1.0000 | ORAL_TABLET | Freq: Four times a day (QID) | ORAL | Status: DC | PRN

## 2016-03-19 MED ORDER — HYDROCODONE-ACETAMINOPHEN 5-325 MG PO TABS
1.0000 | ORAL_TABLET | Freq: Four times a day (QID) | ORAL | Status: DC | PRN
  Administered 2016-03-19 – 2016-03-20 (×3): 2 via ORAL
  Filled 2016-03-19 (×4): qty 2

## 2016-03-19 MED ORDER — PROPOFOL 10 MG/ML IV BOLUS
INTRAVENOUS | Status: DC | PRN
  Administered 2016-03-19: 200 mg via INTRAVENOUS

## 2016-03-19 MED ORDER — SUCCINYLCHOLINE CHLORIDE 20 MG/ML IJ SOLN
INTRAMUSCULAR | Status: DC | PRN
  Administered 2016-03-19: 100 mg via INTRAVENOUS

## 2016-03-19 MED ORDER — ACETAMINOPHEN 160 MG/5ML PO SOLN
325.0000 mg | ORAL | Status: DC | PRN
  Filled 2016-03-19: qty 20.3

## 2016-03-19 MED ORDER — KETOROLAC TROMETHAMINE 30 MG/ML IJ SOLN
INTRAMUSCULAR | Status: DC | PRN
  Administered 2016-03-19: 30 mg via INTRAVENOUS

## 2016-03-19 MED ORDER — ONDANSETRON HCL 4 MG/2ML IJ SOLN: 4.0000 mg | Freq: Four times a day (QID) | INTRAMUSCULAR | Status: DC | PRN

## 2016-03-19 MED ORDER — SODIUM CHLORIDE 0.9 % IV SOLN: INTRAVENOUS | Status: DC

## 2016-03-19 MED ORDER — LACTATED RINGERS IV SOLN
INTRAVENOUS | Status: DC
  Administered 2016-03-19: 07:00:00 via INTRAVENOUS

## 2016-03-19 MED ORDER — ACETAMINOPHEN 325 MG PO TABS
650.0000 mg | ORAL_TABLET | Freq: Four times a day (QID) | ORAL | Status: DC | PRN
Start: 2016-03-19 — End: 2016-03-20
  Administered 2016-03-19: 650 mg via ORAL
  Filled 2016-03-19: qty 2

## 2016-03-19 MED ORDER — PROPOFOL 10 MG/ML IV BOLUS
INTRAVENOUS | Status: AC
  Filled 2016-03-19: qty 20

## 2016-03-19 MED ORDER — OXYCODONE HCL 5 MG PO TABS
ORAL_TABLET | ORAL | Status: AC
  Filled 2016-03-19: qty 1

## 2016-03-19 MED ORDER — MIDAZOLAM HCL 5 MG/5ML IJ SOLN
INTRAMUSCULAR | Status: DC | PRN
  Administered 2016-03-19: 2 mg via INTRAVENOUS

## 2016-03-19 MED ORDER — OXYCODONE HCL 5 MG PO TABS
5.0000 mg | ORAL_TABLET | Freq: Once | ORAL | Status: AC | PRN
  Administered 2016-03-19: 5 mg via ORAL

## 2016-03-19 MED ORDER — METHOCARBAMOL 500 MG PO TABS
500.0000 mg | ORAL_TABLET | Freq: Four times a day (QID) | ORAL | Status: DC | PRN
Start: 2016-03-19 — End: 2016-03-20
  Administered 2016-03-19 – 2016-03-20 (×3): 500 mg via ORAL
  Filled 2016-03-19 (×3): qty 1

## 2016-03-19 MED ORDER — METOCLOPRAMIDE HCL 5 MG PO TABS: 5.0000 mg | ORAL_TABLET | Freq: Three times a day (TID) | ORAL | Status: DC | PRN

## 2016-03-19 MED ORDER — FENTANYL CITRATE (PF) 100 MCG/2ML IJ SOLN
INTRAMUSCULAR | Status: DC | PRN
  Administered 2016-03-19 (×3): 50 ug via INTRAVENOUS

## 2016-03-19 MED ORDER — HYDROMORPHONE HCL 1 MG/ML IJ SOLN
0.2500 mg | INTRAMUSCULAR | Status: DC | PRN
  Administered 2016-03-19 (×4): 0.5 mg via INTRAVENOUS

## 2016-03-19 MED ORDER — ONDANSETRON HCL 4 MG PO TABS: 4.0000 mg | ORAL_TABLET | Freq: Four times a day (QID) | ORAL | Status: DC | PRN

## 2016-03-19 NOTE — H&P (Signed)
PREOPERATIVE H&P  Chief Complaint: left total knee arthroplasty stiffness  HPI: Walter Hodges is a 60 y.o. male who presents for surgical treatment of left total knee arthroplasty stiffness.  He denies any changes in medical history.  Past Medical History  Diagnosis Date  . Bell's palsy 2010    lasted one day ago, told that he had a ministroke  . Hypertension     followed by Dr[facility] Nita Sells" with [facility]    . Arthritis   . Headache   . Hollenhorst plaque, right eye    Past Surgical History  Procedure Laterality Date  . Circumcision      done as a child- given inhalation   . Total knee arthroplasty Right 05/26/2015    Procedure: RIGHT TOTAL KNEE ARTHROPLASTY;  Surgeon: Tarry Kos, MD;  Location: [facility] OR;  Service: Orthopedics;  Laterality: Right;  . Total knee arthroplasty Left 01/01/2016    Procedure: LEFT TOTAL KNEE ARTHROPLASTY;  Surgeon: Tarry Kos, MD;  Location: [facility] OR;  Service: Orthopedics;  Laterality: Left;  . Tee without cardioversion N/A 02/19/2016    Procedure: TRANSESOPHAGEAL ECHOCARDIOGRAM (TEE);  Surgeon: Thurmon Fair, MD;  Location: [facility] ENDOSCOPY;  Service: Cardiovascular;  Laterality: N/A;  . Joint replacement Bilateral    Social History   Social History  . Marital Status: Widowed    Spouse Name: N/A  . Number of Children: N/A  . Years of Education: N/A   Social History Main Topics  . Smoking status: Never Smoker   . Smokeless tobacco: None  . Alcohol Use: No  . Drug Use: No  . Sexual Activity: Not Asked   Other Topics Concern  . None   Social History Narrative   History reviewed. No pertinent family history. Allergies  Allergen Reactions  . No Known Allergies    Prior to Admission medications   Medication Sig Start Date End Date Taking? Authorizing Provider  aspirin EC 325 MG tablet Take 1 tablet (325 mg total) by mouth 2 (two) times daily. 01/01/16  Yes Victory Dresden Donnelly Stager, MD  hydrochlorothiazide (HYDRODIURIL) 25 MG tablet Take 1 tablet  (25 mg total) by mouth daily. 01/07/16  Yes Jaclyn Shaggy, MD  lisinopril (PRINIVIL,ZESTRIL) 40 MG tablet Take 1 tablet (40 mg total) by mouth daily. 01/07/16  Yes Jaclyn Shaggy, MD  oxyCODONE (OXY IR/ROXICODONE) 5 MG immediate release tablet Take 5 mg by mouth every 4 (four) hours as needed for severe pain.   Yes Historical Provider, MD  polyethylene glycol (MIRALAX / GLYCOLAX) packet Take 17 g by mouth daily.   Yes Historical Provider, MD  zolpidem (AMBIEN) 5 MG tablet Take 1 tablet (5 mg total) by mouth at bedtime as needed for sleep. 02/16/16 09/12/16 Yes Jaclyn Shaggy, MD  ergocalciferol (DRISDOL) 50000 units capsule Take 1 capsule (50,000 Units total) by mouth once a week. 03/17/16   Jaclyn Shaggy, MD     Positive ROS: All other systems have been reviewed and were otherwise negative with the exception of those mentioned in the HPI and as above.  Physical Exam: General: Alert, no acute distress Cardiovascular: No pedal edema Respiratory: No cyanosis, no use of accessory musculature GI: abdomen soft Skin: No lesions in the area of chief complaint Neurologic: Sensation intact distally Psychiatric: Patient is competent for consent with normal mood and affect Lymphatic: no lymphedema  MUSCULOSKELETAL: exam stable  Assessment: left total knee arthroplasty stiffness  Plan: Plan for Procedure(s): CLOSED MANIPULATION LEFT KNEE  The risks benefits and alternatives were  discussed with the patient including but not limited to the risks of nonoperative treatment, versus surgical intervention including infection, bleeding, nerve injury,  blood clots, cardiopulmonary complications, morbidity, mortality, among others, and they were willing to proceed.   Cheral AlmasXu, Quinlan Mcfall Ian, MD   03/19/2016 7:52 AM

## 2016-03-19 NOTE — Anesthesia Postprocedure Evaluation (Signed)
Anesthesia Post Note  Patient: Walter Hodges  Procedure(s) Performed: Procedure(s) (LRB): CLOSED MANIPULATION LEFT KNEE (Left)  Patient location during evaluation: PACU Anesthesia Type: General Level of consciousness: awake Pain management: pain level controlled Vital Signs Assessment: post-procedure vital signs reviewed and stable Respiratory status: spontaneous breathing Cardiovascular status: stable Postop Assessment: no signs of nausea or vomiting Anesthetic complications: no    Last Vitals:  Filed Vitals:   03/19/16 1130 03/19/16 1430  BP: 156/90 153/83  Pulse: 68 82  Temp: 36.3 C 36.4 C  Resp:      Last Pain:  Filed Vitals:   03/19/16 1431  PainSc: Asleep                 Kynzi Levay

## 2016-03-19 NOTE — Anesthesia Preprocedure Evaluation (Signed)
Anesthesia Evaluation  Patient identified by MRN, date of birth, ID band Patient awake    Reviewed: Allergy & Precautions, NPO status , Patient's Chart, lab work & pertinent test results  History of Anesthesia Complications Negative for: history of anesthetic complications  Airway Mallampati: II  TM Distance: >3 FB Neck ROM: Full    Dental  (+) Dental Advisory Given   Pulmonary neg pulmonary ROS,    breath sounds clear to auscultation       Cardiovascular hypertension, Pt. on medications (-) angina+ Peripheral Vascular Disease  (-) Past MI and (-) CHF  Rhythm:Regular     Neuro/Psych  Headaches,  Neuromuscular disease negative psych ROS   GI/Hepatic negative GI ROS, Neg liver ROS,   Endo/Other  negative endocrine ROS  Renal/GU negative Renal ROS     Musculoskeletal  (+) Arthritis ,   Abdominal   Peds  Hematology  (+) anemia ,   Anesthesia Other Findings   Reproductive/Obstetrics                             Anesthesia Physical Anesthesia Plan  ASA: II  Anesthesia Plan:    Post-op Pain Management:    Induction: Intravenous  Airway Management Planned: Mask  Additional Equipment: None  Intra-op Plan:   Post-operative Plan:   Informed Consent: I have reviewed the patients History and Physical, chart, labs and discussed the procedure including the risks, benefits and alternatives for the proposed anesthesia with the patient or authorized representative who has indicated his/her understanding and acceptance.   Dental advisory given  Plan Discussed with: CRNA and Surgeon  Anesthesia Plan Comments:         Anesthesia Quick Evaluation

## 2016-03-19 NOTE — Anesthesia Procedure Notes (Signed)
Date/Time: 03/19/2016 8:37 AM Performed by: Demetrio LappingSMITH, Heliodoro Domagalski P Pre-anesthesia Checklist: Patient identified, Emergency Drugs available, Suction available, Patient being monitored and Timeout performed Patient Re-evaluated:Patient Re-evaluated prior to inductionOxygen Delivery Method: Circle system utilized Preoxygenation: Pre-oxygenation with 100% oxygen Intubation Type: IV induction Ventilation: Mask ventilation without difficulty and Oral airway inserted - appropriate to patient size Placement Confirmation: positive ETCO2 Dental Injury: Teeth and Oropharynx as per pre-operative assessment  Comments: Mask case

## 2016-03-19 NOTE — Op Note (Signed)
   Date of Surgery: 03/19/2016  INDICATIONS: Mr. Walter Hodges is a 60 y.o.-year-old male with a left knee stiffness s/p knee replacement;  The patient did consent to the procedure after discussion of the risks and benefits.  PREOPERATIVE DIAGNOSIS: Left knee arthrofibrosis s/p knee replacement  POSTOPERATIVE DIAGNOSIS: Same.  PROCEDURE: Closed manipulation of left knee under general anesthesia  SURGEON: N. Glee ArvinMichael Xu, M.D.  ASSIST: none.  ANESTHESIA:  general  IV FLUIDS AND URINE: See anesthesia.  ESTIMATED BLOOD LOSS: none mL.  IMPLANTS: none  DRAINS: none  COMPLICATIONS: None.  DESCRIPTION OF PROCEDURE: The patient was brought to the operating room and placed supine on the operating table.  The patient had been signed prior to the procedure and this was documented. The patient had the anesthesia placed by the anesthesiologist.  A time-out was performed to confirm that this was the correct patient, site, side and location.   With the patient fully anesthetized, I performed closed manipulation of the left knee.  I was able to improve his flexion from 70 degrees to 92 degrees.  Patient tolerated the procedure well.  POSTOPERATIVE PLAN: Overnight stay and discharge home in the morning.  Mayra ReelN. Kilo Xu, MD [facility] [phone] 8:41 AM

## 2016-03-19 NOTE — Transfer of Care (Signed)
Immediate Anesthesia Transfer of Care Note  Patient: Walter Hodges  Procedure(s) Performed: Procedure(s): CLOSED MANIPULATION LEFT KNEE (Left)  Patient Location: PACU  Anesthesia Type:General  Level of Consciousness: awake, alert , oriented and patient cooperative  Airway & Oxygen Therapy: Patient Spontanous Breathing and Patient connected to nasal cannula oxygen  Post-op Assessment: Report given to RN and Post -op Vital signs reviewed and stable  Post vital signs: Reviewed and stable  Last Vitals:  Filed Vitals:   03/19/16 0702 03/19/16 0717  BP: 156/100 152/87  Pulse: 84   Temp: 36.8 C   Resp: 20     Last Pain:  Filed Vitals:   03/19/16 0717  PainSc: 7       Patients Stated Pain Goal: 3 (03/19/16 16100712)  Complications: No apparent anesthesia complications

## 2016-03-19 NOTE — Progress Notes (Signed)
Pt c/o of Headache tylenol 650 mg given with relief

## 2016-03-20 DIAGNOSIS — M24662 Ankylosis, left knee: Secondary | ICD-10-CM | POA: Diagnosis not present

## 2016-03-20 NOTE — Progress Notes (Signed)
Patient ID: Walter Hodges, male   DOB: 1955-11-11, 60 y.o.   MRN: [id number] Ready for discharge, flexion therapy discussed.    He will resume Outpt, PT on Monday.

## 2016-03-22 ENCOUNTER — Encounter: Payer: Medicaid Other | Admitting: Physical Therapy

## 2016-03-22 ENCOUNTER — Encounter (HOSPITAL_COMMUNITY): Payer: Self-pay | Admitting: Orthopaedic Surgery

## 2016-03-22 ENCOUNTER — Ambulatory Visit (INDEPENDENT_AMBULATORY_CARE_PROVIDER_SITE_OTHER): Payer: Medicaid Other | Admitting: Cardiovascular Disease

## 2016-03-22 VITALS — BP 124/90 | HR 88 | Ht 70.0 in | Wt 234.6 lb

## 2016-03-22 DIAGNOSIS — Q2111 Secundum atrial septal defect: Secondary | ICD-10-CM

## 2016-03-22 DIAGNOSIS — Q211 Atrial septal defect: Secondary | ICD-10-CM | POA: Diagnosis not present

## 2016-03-22 DIAGNOSIS — I10 Essential (primary) hypertension: Secondary | ICD-10-CM | POA: Diagnosis not present

## 2016-03-22 DIAGNOSIS — H34211 Partial retinal artery occlusion, right eye: Secondary | ICD-10-CM

## 2016-03-22 NOTE — Patient Instructions (Addendum)
You have a very small congenital ATRIAL SEPTAL DEFECT (a communication between the upper heart chambers that you were born with). This is not a normal finding, but is very small and unlikely to be of importance unless you develop clots in your legs (for example following knee surgery). It does not need surgery. 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 The most important measures to prevent this from happening again is to treat your high blood pressure, avoid cigarette smoking and treat elevated cholesterol if necessary (which we are checking today).  Your physician recommends that you return for lab work at your earliest convenience - FASTING.  Dr Royann Shiversroitoru recommends that you follow-up with him as needed.

## 2016-03-22 NOTE — Progress Notes (Signed)
Cardiology Office Note    Date:  03/22/2016   ID:  SILVINO SELMAN, DOB 1955/11/21, MRN [id number]  PCP:  Jaclyn Shaggy, MD  Cardiologist:   Thurmon Fair, MD   Chief Complaint  Patient presents with  . Follow-up    TEE, pt denied chest pain and SOB    History of Present Illness:  Walter Hodges is a 60 y.o. 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 He was referred by his primary care provider Dr. Venetia Night and his ophthalmologist Dr. Sallye Lat for the which I performed roughly 1 month ago. Unaware of any neurological abnormalities. He had recently undergone knee surgery. He reports having undergone lower extremity ultrasound to exclude DVT without any findings of clot. His TEE did not show any evidence of significant atherosclerotic plaque in the aortic arch or ascending aorta and carotid Dopplers were likewise unremarkable. 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 He denies exertional dyspnea, leg edema, chest pain, palpitations, syncope, claudication or other cardiovascular complaints. He is still recovering from his knee surgery.  He has well compensated hypertension on ACE inhibitor and diuretics. Does not have a history of clinically evident stroke, diabetes mellitus, peripheral arterial disease or other chronic medical illnesses. She does not know if his cholesterol has been checked. I cannot find a lipid profile in the computerized medical record.  Other than the small secundum atrial septal defect with a very small dominantly left-to-right shunt the TEE was essentially normal test.  Past Medical History  Diagnosis Date  . Bell's palsy 2010    lasted one day ago, told that he had a ministroke  . Hypertension     followed by Dr[facility] Nita Sells" [facility]    . Arthritis   . Headache   . Hollenhorst plaque, right eye     Past Surgical History  Procedure Laterality Date  . Circumcision     done as a child- given inhalation   . Total knee arthroplasty Right 05/26/2015    Procedure: RIGHT TOTAL KNEE ARTHROPLASTY;  Surgeon: Tarry Kos, MD;  Location: [facility] OR;  Service: Orthopedics;  Laterality: Right;  . Total knee arthroplasty Left 01/01/2016    Procedure: LEFT TOTAL KNEE ARTHROPLASTY;  Surgeon: Tarry Kos, MD;  Location: [facility] OR;  Service: Orthopedics;  Laterality: Left;  . Tee without cardioversion N/A 02/19/2016    Procedure: TRANSESOPHAGEAL ECHOCARDIOGRAM (TEE);  Surgeon: Thurmon Fair, MD;  Location[facility] ENDOSCOPY;  Service: Cardiovascular;  Laterality: N/A;  . Joint replacement Bilateral   . Knee closed reduction Left 03/19/2016    Procedure: CLOSED MANIPULATION LEFT KNEE;  Surgeon: Tarry Kos, MD;  Location: [facility] OR;  Service: Orthopedics;  Laterality: Left;    Current Medications: Outpatient Prescriptions Prior to Visit  Medication Sig Dispense Refill  . aspirin EC 325 MG tablet Take 1 tablet (325 mg total) by mouth 2 (two) times daily. 84 tablet 0  . ergocalciferol (DRISDOL) 50000 units capsule Take 1 capsule (50,000 Units total) by mouth once a week. 9 capsule 0  . hydrochlorothiazide (HYDRODIURIL) 25 MG tablet Take 1 tablet (25 mg total) by mouth daily. 30 tablet 2  . HYDROcodone-acetaminophen (NORCO) 5-325 MG tablet Take 1-2 tablets by mouth every 6 (six) hours as needed. 30 tablet 0  . lisinopril (PRINIVIL,ZESTRIL) 40 MG tablet Take 1 tablet (40 mg total) by mouth daily. 30 tablet 2  . oxyCODONE (OXY IR/ROXICODONE) 5 MG immediate release tablet  Take 5 mg by mouth every 4 (four) hours as needed for severe pain.    . polyethylene glycol (MIRALAX / GLYCOLAX) packet Take 17 g by mouth daily.    Marland Kitchen. zolpidem (AMBIEN) 5 MG tablet Take 1 tablet (5 mg total) by mouth at bedtime as needed for sleep. 30 tablet 2   No facility-administered medications prior to visit.     Allergies:   No known allergies   Social History   Social History  . Marital Status: Widowed    Spouse  Name: N/A  . Number of Children: N/A  . Years of Education: N/A   Social History Main Topics  . Smoking status: Never Smoker   . Smokeless tobacco: Not on file  . Alcohol Use: No  . Drug Use: No  . Sexual Activity: Not on file   Other Topics Concern  . Not on file   Social History Narrative     Family History:  The patient's Family history significantly negative for premature coronary other vascular problems  ROS:   Please see the history of present illness.    ROS All other systems reviewed and are negative.   PHYSICAL EXAM:   VS:  BP 124/90 mmHg  Pulse 88  Ht 5\' 10"  (1.778 m)  Wt 106.414 kg (234 lb 9.6 oz)  BMI 33.66 kg/m2   GEN: Well nourished, well developed, in no acute distress HEENT: normal Neck: no JVD, carotid bruits, or masses Cardiac: RRR; no murmurs, rubs, or gallops,no edema  Respiratory:  clear to auscultation bilaterally, normal work of breathing GI: soft, nontender, nondistended, + BS MS: no deformity or atrophy, healed scar left knee surgery Skin: warm and dry, no rash Neuro:  Alert and Oriented x 3, Strength and sensation are intact Psych: euthymic mood, full affect  Wt Readings from Last 3 Encounters:  03/22/16 106.414 kg (234 lb 9.6 oz)  03/19/16 107.502 kg (237 lb)  03/15/16 107.775 kg (237 lb 9.6 oz)      Studies/Labs Reviewed:   EKG:  EKG is ordered today.  The ekg Is normal sinus rhythm, borderline voltage criteria for LVH, nonspecific T-wave changes that might also be secondary to LVH but not really meeting diagnostic criteria  Recent Labs: 12/29/2015: ALT 16* 03/15/2016: Hemoglobin 11.3*; Platelets 370; TSH 1.24 03/17/2016: BUN 13; Creatinine, Ser 0.72; Potassium 3.3*; Sodium 139   Lipid Panel No results found for: CHOL, TRIG, HDL, CHOLHDL, VLDL, LDLCALC, LDLDIRECT  Additional studies/ records that were reviewed today include:  Records from Dr.Groat and [facility], TEE images  ASSESSMENT:    1. 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 Essential hypertension   3. Hollenhorst plaque, right eye      PLAN:  In order of problems listed above:  1. ASD: This is unlikely to be clinically significant. It is too small to cause significant overload of the pulmonary circulation or pulmonary hypertension. It is not causing any symptoms. While it may be the source of a paradoxical embolism this would be a thrombus rather than a cholesterol plaque. It therefore cannot be incriminated as the cause for his retinal abnormality. No treatment is recommended at this time. She is to undergo future procedures at high risk for development of DVT, aggressive DVT prophylaxis measures are recommended. 2. HTN: blood pressure with fair control 3. Hollenhorst plaque, right eye: check lipid profile; no significant abnormalities in the carotids or proximal aorta found to explain this.    Medication Adjustments/Labs  and Tests Ordered: Current medicines are reviewed at length with the patient today.  Concerns regarding medicines are outlined above.  Medication changes, Labs and Tests ordered today are listed in the Patient Instructions below. Patient Instructions  You have a very small congenital ATRIAL SEPTAL DEFECT (a communication between the upper heart chambers that you were born with). This is not a normal finding, but is very small and unlikely to be of importance unless you develop clots in your legs (for example following knee surgery). It does not need surgery. 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 The most important measures to prevent this from happening again is to treat your high blood pressure, avoid cigarette smoking and treat elevated cholesterol if necessary (which we are checking today).  Your physician recommends that you return for lab work at your earliest convenience - FASTING.  Dr Royann Shivers recommends  that you follow-up with him as needed.     Signed, Thurmon Fair, MD  03/22/2016 3:25 PM    [facility] [address] [place], [place], [place]  [address] Phone: [phone]; Fax: [phone]

## 2016-03-23 LAB — LIPID PANEL
CHOL/HDL RATIO: 2.7 ratio (ref <=5.0)
Cholesterol: 138 mg/dL (ref 125–200)
HDL: 52 mg/dL (ref >=40)
LDL CALC: 71 mg/dL (ref <130)
Triglycerides: 77 mg/dL (ref <150)
VLDL: 15 mg/dL (ref <30)

## 2016-03-25 ENCOUNTER — Ambulatory Visit: Payer: Medicaid Other | Attending: Orthopaedic Surgery | Admitting: Physical Therapy

## 2016-03-25 DIAGNOSIS — R262 Difficulty in walking, not elsewhere classified: Secondary | ICD-10-CM | POA: Diagnosis present

## 2016-03-25 DIAGNOSIS — M25562 Pain in left knee: Secondary | ICD-10-CM | POA: Diagnosis not present

## 2016-03-25 NOTE — Patient Instructions (Signed)
[email or web address] L HS and gastroc/soleus stretch, 3x30 sec each Heel slides with strap, 5 sec hold 2x15  Quad set 2 x 15 with 5 sec hold (towel under heel) Knee flexion stretch at step, 15x5 sec hold SLR 2x15  Educated to perform 2x/day

## 2016-03-25 NOTE — Therapy (Signed)
[facility] [address] , [place], [address] Phone: [phone]   Fax:  [phone]  Physical Therapy Evaluation  Patient Details  Name: Walter Hodges MRN: [id number] Date of Birth: 1955-12-03 Referring Provider: Tarry KosXU, NAIPING M  Encounter Date: 03/25/2016      PT End of Session - 03/25/16 1404    Visit Number 1   Number of Visits 17   Date for PT Re-Evaluation 04/22/16   Authorization Type medicaid   PT Start Time 1025   PT Stop Time 1115   PT Time Calculation (min) 50 min   Activity Tolerance Patient tolerated treatment well;Patient limited by pain   Behavior During Therapy [facility] for tasks assessed/performed      Past Medical History  Diagnosis Date  . Bell's palsy 2010    lasted one day ago, told that he had a ministroke  . Hypertension     followed by Dr. Nita Sells"maryann" with [facility]    . Arthritis   . Headache   . Hollenhorst plaque, right eye     Past Surgical History  Procedure Laterality Date  . Circumcision      done as a child- given inhalation   . Total knee arthroplasty Right 05/26/2015    Procedure: RIGHT TOTAL KNEE ARTHROPLASTY;  Surgeon: Tarry KosNaiping M Xu, MD;  Location: [facility] OR;  Service: Orthopedics;  Laterality: Right;  . Total knee arthroplasty Left 01/01/2016    Procedure: LEFT TOTAL KNEE ARTHROPLASTY;  Surgeon: Tarry KosNaiping M Xu, MD;  Location: [facility] OR;  Service: Orthopedics;  Laterality: Left;  . Tee without cardioversion N/A 02/19/2016    Procedure: TRANSESOPHAGEAL ECHOCARDIOGRAM (TEE);  Surgeon: Thurmon FairMihai Croitoru, MD;  [facility];  Service: Cardiovascular;  Laterality: N/A;  . Joint replacement Bilateral   . Knee closed reduction Left 03/19/2016    Procedure: CLOSED MANIPULATION LEFT KNEE;  Surgeon: Tarry KosNaiping M Xu, MD;  Location: [facility] OR;  Service: Orthopedics;  Laterality: Left;    There were no vitals filed for this visit.       Subjective Assessment - 03/25/16 1401    Subjective Pt  reports having L TKA manipulation Friday (7/14) following L TKA in April 2017. He reports continued pain, tightness, swelling, and difficulty with knee flexion, stairs, and transfers. He reports having mild difficulty with ADLs due to L knee pain.  He denies n/t in the L knee, denies any recent falls. He returns to his doctor on Monday 7/24.   Limitations Standing;Walking;House hold activities   Patient Stated Goals to get the L knee like the R knee   Currently in Pain? Yes   Pain Score 2    Pain Location Knee   Pain Orientation Left   Pain Descriptors / Indicators Sore   Pain Type Surgical pain            [facility] PT Assessment - 03/25/16 1053    Assessment   Medical Diagnosis left TKR   Onset Date/Surgical Date 01/01/16   Hand Dominance Right   Next MD Visit 01/15/16   Prior Therapy no   Precautions   Precautions None   Restrictions   Weight Bearing Restrictions No   Balance Screen   Has the patient fallen in the past 6 months No   Has the patient had a decrease in activity level because of a fear of falling?  Yes   Is the patient reluctant to leave their home because of a fear of falling?  No  Home Environment   Living Environment Private residence   Living Arrangements Other relatives   Available Help at Discharge Family   Type of Home House   Home Access Stairs to enter   Entrance Stairs-Number of Steps about 6   Entrance Stairs-Rails Cannot reach both   Home Layout One level   Home Equipment Walker - 2 wheels   Prior Function   Level of Independence Independent   Vocation Unemployed      REFLEXES Not tested  SENSATION  WNL BLE  MMT((on scale of 0-5/5)  Left Right   Hip flexion 4+ 5  Hip extension 4 4  Hip IR/ER    Hip abduction 4+ 4+  Hip adduction 4+ 4+  Knee flexion 4+ 4+  Knee extension 5 5  Ankle DF 5 5  Ankle PF     AROM  Left AAROM of Left:  Knee extension -12 deg -2 deg  Knee flexion 68 deg 75 deg    PALPATION Increased swelling and  warmth of L knee compared to R; hypomobile patella in all directions; increased soft tissue restrictions and tenderness to palpation of L quad, HS, gastroc/soleus, ITB.  Girth/swelling measurements: (in inches)  Left Right  At knee joint line 19.1 17.75   GAIT Ambulates with SPC; increased lateral trunk flexion, decreased hip flexion, knee flexion, and hip extension on L  FUNCTIONAL MOVEMENTS (squats, SLS, stairs, etc.) Pt requires UE support throughout transfers and demonstrates decreased control with stand>sit   TEST  Outcome  Interpretation   5 times sit<>stand  30.2 >60 yo, >15 sec indicates increased risk for falls; <60 yo, >10 sec indicates increased risk for falls   0.48m/s Household ambulator      Therex: L HS and gastroc/soleus stretch, 3x30 sec each Heel slides with 5 sec hold x 15; min cues to increase pull on strap  Quad set x 15 with 5 sec hold (towel under heel) Knee flexion stretch at step, 15x5 sec hold SLR x 15; min cues to decrease knee lag        PT Education - 03/25/16 1403    Education provided Yes   Education Details exam findings, HEP   Person(s) Educated Patient   Methods Explanation   Comprehension Verbalized understanding             PT Long Term Goals - 03/25/16 1415    PT LONG TERM GOAL #1   Title pt will be independent with HEP to maximize ROM, improve function, and decrease pain.   Time 8   Period Weeks   Status New   PT LONG TERM GOAL #2   Title pt will have improved 5x sit to stand time to <20s to demonstrate improved function.   Baseline 30.2s   Time 8   Period Weeks   Status New   PT LONG TERM GOAL #3   Title pt will have improved by >0.51m/s to demonstrate improved function and ROM.   Baseline 0.55m/s   Time 8   Period Weeks   Status New   PT LONG TERM GOAL #4   Title pt will have decreased swelling of L knee by >1" to demonstrate improved function, ROM, and decreased pain.   Baseline 19.1"   Time 8    Period Weeks   Status New   PT LONG TERM GOAL #5   Title pt will have L AROM of 0 deg knee extension, and 110 deg flexion, to demonstrate improved gait and stair  ambulation, and overall function.   Baseline -12 extension; 68 flexion   Time 8   Period Weeks   Status New               Plan - 03/25/16 1404    Clinical Impression Statement Pt is 60 YO M presenting to therapy s/p L TKA manipulation. Pt has very mild deficits strength but continues to have pain, swelling, and decreased ROM in LLE. Pt demonstrates deficits in functional movement, as demonstrated by poor control during transfers and need for UE support. Pt educated on importance of moving L knee and on performing HEP to improve ROM and overall function. Pt needs skilled PT intervention to address deficits in order to maximize overall function and decrease pain.   Rehab Potential Fair   Clinical Impairments Affecting Rehab Potential issues with prior treatment, reqiuring manipulation; comorbidities, personal factors   PT Frequency 2x / week   PT Duration 8 weeks   PT Treatment/Interventions ADLs/Self Care Home Management;Cryotherapy;Electrical Stimulation;Gait training;Stair training;Functional mobility training;Therapeutic activities;Therapeutic exercise;Balance training;Neuromuscular re-education;Patient/family education;Manual techniques;Passive range of motion;Dry needling   PT Next Visit Plan ROM, manual, review HEP   Consulted and Agree with Plan of Care Patient      Patient will benefit from skilled therapeutic intervention in order to improve the following deficits and impairments:  Abnormal gait, Decreased activity tolerance, Decreased balance, Decreased mobility, Decreased range of motion, Difficulty walking, Hypomobility, Increased edema, Increased muscle spasms, Impaired flexibility, Pain  Visit Diagnosis: Pain in left knee - Plan: PT plan of care cert/re-cert  Difficulty in walking, not elsewhere classified -  Plan: PT plan of care cert/re-cert     Problem List Patient Active Problem List   Diagnosis Date Noted  . 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 Knee joint pain 03/19/2016  . Embolism involving retinal artery   . Insomnia 02/16/2016  . Hollenhorst plaque, right eye 02/16/2016  . Broken tooth, complicated 01/23/2016  . Hypertension 01/07/2016  . Total knee replacement status 01/01/2016  . Primary osteoarthritis of right knee 05/26/2015   Jac Canavan, SPT  This entire session was performed under direct supervision and direction of a licensed therapist/therapist assistant . I have personally read, edited and approve of the note as written.  Mindy Lucillie Garfinkel, PT, DPT  03/25/2016, 4:58 PM [phone]  [facility] MAIN [facility] SERVICES [address] [place], [place], [address] Phone: [phone]   Fax:  [phone]  Name: Walter Hodges MRN: [id number] Date of Birth: 07-24-1956

## 2016-04-29 ENCOUNTER — Encounter: Payer: Self-pay | Admitting: Emergency Medicine

## 2016-04-29 ENCOUNTER — Telehealth: Payer: Self-pay | Admitting: Family Medicine

## 2016-04-29 ENCOUNTER — Emergency Department
Admission: EM | Admit: 2016-04-29 | Discharge: 2016-04-29 | Disposition: A | Discharge status: Home / Self Care | Payer: Medicaid Other | Attending: Emergency Medicine | Admitting: Emergency Medicine

## 2016-04-29 DIAGNOSIS — Z7982 Long term (current) use of aspirin: Secondary | ICD-10-CM | POA: Insufficient documentation

## 2016-04-29 DIAGNOSIS — K146 Glossodynia: Secondary | ICD-10-CM | POA: Diagnosis present

## 2016-04-29 DIAGNOSIS — K120 Recurrent oral aphthae: Secondary | ICD-10-CM | POA: Diagnosis not present

## 2016-04-29 DIAGNOSIS — I10 Essential (primary) hypertension: Secondary | ICD-10-CM

## 2016-04-29 DIAGNOSIS — Z79899 Other long term (current) drug therapy: Secondary | ICD-10-CM | POA: Diagnosis not present

## 2016-04-29 DIAGNOSIS — Z791 Long term (current) use of non-steroidal anti-inflammatories (NSAID): Secondary | ICD-10-CM | POA: Diagnosis not present

## 2016-04-29 MED ORDER — MAGIC MOUTHWASH
15.0000 mL | Freq: Once | ORAL | Status: AC
  Administered 2016-04-29: 15 mL via ORAL
  Filled 2016-04-29: qty 20

## 2016-04-29 MED ORDER — LISINOPRIL 40 MG PO TABS: 40.0000 mg | ORAL_TABLET | Freq: Every day | ORAL | 3 refills | Status: DC

## 2016-04-29 MED ORDER — MAGIC MOUTHWASH W/LIDOCAINE: 15.0000 mL | Freq: Four times a day (QID) | ORAL | 0 refills | Status: DC

## 2016-04-29 MED FILL — ?LISINOPRIL 40 MG TABLET: 40 MG | 30 days supply | Qty: 30 | Fill #0

## 2016-04-29 NOTE — Telephone Encounter (Signed)
Medication Refill: lisinopril (PRINIVIL,ZESTRIL) 40 MG tablet

## 2016-04-29 NOTE — ED Provider Notes (Signed)
[facility] Emergency Department Provider Note   ____________________________________________   First MD Initiated Contact with Patient 04/29/16 2253     (approximate)  I have reviewed the triage vital signs and the nursing notes.   HISTORY  Chief Complaint Oral Pain    HPI Walter Hodges is a 60 y.o. male patient complaining pain to the lower inner lips and tongue times one week. Patient stated this all 4 type lesions on his lower inner lip. Patient denies any bleeding from his gums. Patient stated refractory to aggressive peroxide and Listerine.He rates his pain as a 5/10. Patient stated pain is not interfering with eating and drinking. No other palliative measures for this complaint.   Past Medical History:  Diagnosis Date  . Arthritis   . Bell's palsy 2010   lasted one day ago, told that he had a ministroke  . Headache   . Hollenhorst plaque, right eye   . Hypertension    followed by Dr. Nita Sells" with [facility]      Patient Active Problem List   Diagnosis Date Noted  . 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 Knee joint pain 03/19/2016  . Embolism involving retinal artery   . Insomnia 02/16/2016  . Hollenhorst plaque, right eye 02/16/2016  . Broken tooth, complicated 01/23/2016  . Hypertension 01/07/2016  . Total knee replacement status 01/01/2016  . Primary osteoarthritis of right knee 05/26/2015    Past Surgical History:  Procedure Laterality Date  . CIRCUMCISION     done as a child- given inhalation   . JOINT REPLACEMENT Bilateral   . KNEE CLOSED REDUCTION Left 03/19/2016   Procedure: CLOSED MANIPULATION LEFT KNEE;  Surgeon: Tarry Kos, MD;  Location: [facility] OR;  Service: Orthopedics;  Laterality: Left;  . TEE WITHOUT CARDIOVERSION N/A 02/19/2016   Procedure: TRANSESOPHAGEAL ECHOCARDIOGRAM (TEE);  Surgeon: Thurmon Fair, MD;  Location: [facility] ENDOSCOPY;  Service: Cardiovascular;   Laterality: N/A;  . TOTAL KNEE ARTHROPLASTY Right 05/26/2015   Procedure: RIGHT TOTAL KNEE ARTHROPLASTY;  Surgeon: Tarry Kos, MD;  Location: [facility] OR;  Service: Orthopedics;  Laterality: Right;  . TOTAL KNEE ARTHROPLASTY Left 01/01/2016   Procedure: LEFT TOTAL KNEE ARTHROPLASTY;  Surgeon: Tarry Kos, MD;  Location: [facility] OR;  Service: Orthopedics;  Laterality: Left;    Prior to Admission medications   Medication Sig Start Date End Date Taking? Authorizing Provider  aspirin EC 325 MG tablet Take 1 tablet (325 mg total) by mouth 2 (two) times daily. 01/01/16   Tarry Kos, MD  ergocalciferol (DRISDOL) 50000 units capsule Take 1 capsule (50,000 Units total) by mouth once a week. 03/17/16   Jaclyn Shaggy, MD  hydrochlorothiazide (HYDRODIURIL) 25 MG tablet Take 1 tablet (25 mg total) by mouth daily. 01/07/16   Jaclyn Shaggy, MD  HYDROcodone-acetaminophen (NORCO) 5-325 MG tablet Take 1-2 tablets by mouth every 6 (six) hours as needed. 03/19/16   Naiping Donnelly Stager, MD  lisinopril (PRINIVIL,ZESTRIL) 40 MG tablet Take 1 tablet (40 mg total) by mouth daily. 04/29/16   Jaclyn Shaggy, MD  magic mouthwash w/lidocaine SOLN Take 15 mLs by mouth 4 (four) times daily. 04/29/16   Joni Reining, PA-C  oxyCODONE (OXY IR/ROXICODONE) 5 MG immediate release tablet Take 5 mg by mouth every 4 (four) hours as needed for severe pain.    Historical Provider, MD  polyethylene glycol (MIRALAX / GLYCOLAX) packet Take 17 g by mouth daily.    Historical Provider, MD  zolpidem (AMBIEN) 5 MG tablet Take 1 tablet (5 mg total) by mouth at bedtime as needed for sleep. 02/16/16 09/12/16  Jaclyn Shaggy, MD    Allergies No known allergies  No family history on file.  Social History Social History  Substance Use Topics  . Smoking status: Never Smoker  . Smokeless tobacco: Never Used  . Alcohol use No    Review of Systems Constitutional: No fever/chills Eyes: No visual changes. ENT: No sore throat. Mouth pain Cardiovascular: Denies chest  pain. Respiratory: Denies shortness of breath. Gastrointestinal: No abdominal pain.  No nausea, no vomiting.  No diarrhea.  No constipation. Genitourinary: Negative for dysuria. Musculoskeletal: Negative for back pain. Skin: Negative for rash. Neurological: Negative for headaches, focal weakness or numbness.    ____________________________________________   PHYSICAL EXAM:  VITAL SIGNS: ED Triage Vitals  Enc Vitals Group     BP 04/29/16 2224 (!) 169/94     Pulse Rate 04/29/16 2224 84     Resp 04/29/16 2224 18     Temp 04/29/16 2224 98.4 F (36.9 C)     Temp Source 04/29/16 2224 Oral     SpO2 04/29/16 2224 97 %     Weight 04/29/16 2226 238 lb (108 kg)     Height 04/29/16 2226 5\' 10"  (1.778 m)     Head Circumference --      Peak Flow --      Pain Score 04/29/16 2226 5     Pain Loc --      Pain Edu? --      Excl. in GC? --     Constitutional: Alert and oriented. Well appearing and in no acute distress. Eyes: Conjunctivae are normal. PERRL. EOMI. Head: Atraumatic. Nose: No congestion/rhinnorhea. Mouth/Throat: Mucous membranes are moist. Multiple oral mucosal lesions lower inner lip and also on anterior tongue. Oropharynx non-erythematous. Neck: No stridor.  No cervical spine tenderness to palpation. Hematological/Lymphatic/Immunilogical: No cervical lymphadenopathy. Cardiovascular: Normal rate, regular rhythm. Grossly normal heart sounds.  Good peripheral circulation. Respiratory: Normal respiratory effort.  No retractions. Lungs CTAB. Gastrointestinal: Soft and nontender. No distention. No abdominal bruits. No CVA tenderness. Musculoskeletal: No lower extremity tenderness nor edema.  No joint effusions. Neurologic:  Normal speech and language. No gross focal neurologic deficits are appreciated. No gait instability. Skin:  Skin is warm, dry and intact. No rash noted. Psychiatric: Mood and affect are normal. Speech and behavior are  normal.  ____________________________________________   LABS (all labs ordered are listed, but only abnormal results are displayed)  Labs Reviewed - No data to display ____________________________________________  EKG   ____________________________________________  RADIOLOGY   ____________________________________________   PROCEDURES  Procedure(s) performed: None  Procedures  Critical Care performed: No  ____________________________________________   INITIAL IMPRESSION / ASSESSMENT AND PLAN / ED COURSE  Pertinent labs & imaging results that were available during my care of the patient were reviewed by me and considered in my medical decision making (see chart for details).  Aphthous ulcers. Patient given discharge Instructions. Patient given a prescription for Magic mouthwash. Patient advised to follow up PCP if no improvement in 2-3 days.  Clinical Course     ____________________________________________   FINAL CLINICAL IMPRESSION(S) / ED DIAGNOSES  Final diagnoses:  Oral aphthous ulcer      NEW MEDICATIONS STARTED DURING THIS VISIT:  New Prescriptions   MAGIC MOUTHWASH W/LIDOCAINE SOLN    Take 15 mLs by mouth 4 (four) times daily.     Note:  This document was prepared using  [employer] engine[employer]oice recognition software and may include unintentional dictation errors.    Joni ReiningRonald K Plummer Matich, PA-C 04/29/16 2309    Charlynne Panderavid Hsienta Yao, MD 04/29/16 2329

## 2016-04-29 NOTE — Telephone Encounter (Signed)
Requested medication refilled

## 2016-04-29 NOTE — ED Triage Notes (Signed)
Pt presents to ED with c/o gum pain x 1 week, pt reports rinsing with peroxide and Listerine without relief. Pt denies bleeding from gums. Pt denies difficulty breathing or swallowing.

## 2016-05-11 ENCOUNTER — Telehealth: Payer: Self-pay | Admitting: Family Medicine

## 2016-05-11 DIAGNOSIS — G4700 Insomnia, unspecified: Secondary | ICD-10-CM

## 2016-05-11 NOTE — Telephone Encounter (Signed)
Pt. Called requesting a refill on Ambien. Pt. States he only has a pill left.  Please f/u

## 2016-05-12 MED ORDER — ZOLPIDEM TARTRATE 5 MG PO TABS: 5.0000 mg | ORAL_TABLET | Freq: Every evening | ORAL | 0 refills | Status: DC | PRN

## 2016-05-12 NOTE — Telephone Encounter (Signed)
Done

## 2016-05-17 ENCOUNTER — Encounter: Payer: Self-pay | Admitting: Family Medicine

## 2016-05-17 ENCOUNTER — Ambulatory Visit: Payer: Medicaid Other | Attending: Family Medicine | Admitting: Family Medicine

## 2016-05-17 VITALS — BP 134/69 | HR 83 | Temp 98.1°F | Ht 70.0 in | Wt 244.6 lb

## 2016-05-17 DIAGNOSIS — G4700 Insomnia, unspecified: Secondary | ICD-10-CM | POA: Diagnosis not present

## 2016-05-17 DIAGNOSIS — E876 Hypokalemia: Secondary | ICD-10-CM | POA: Diagnosis not present

## 2016-05-17 DIAGNOSIS — Z7982 Long term (current) use of aspirin: Secondary | ICD-10-CM | POA: Diagnosis not present

## 2016-05-17 DIAGNOSIS — Z79899 Other long term (current) drug therapy: Secondary | ICD-10-CM | POA: Insufficient documentation

## 2016-05-17 DIAGNOSIS — Z9889 Other specified postprocedural states: Secondary | ICD-10-CM | POA: Insufficient documentation

## 2016-05-17 DIAGNOSIS — I10 Essential (primary) hypertension: Secondary | ICD-10-CM | POA: Diagnosis not present

## 2016-05-17 DIAGNOSIS — N528 Other male erectile dysfunction: Secondary | ICD-10-CM | POA: Insufficient documentation

## 2016-05-17 LAB — BASIC METABOLIC PANEL
BUN: 13 mg/dL (ref 7–25)
CHLORIDE: 103 mmol/L (ref 98–110)
CO2: 30 mmol/L (ref 20–31)
Calcium: 9.2 mg/dL (ref 8.6–10.3)
Creat: 0.66 mg/dL — ABNORMAL LOW (ref 0.70–1.25)
GLUCOSE: 102 mg/dL — AB (ref 65–99)
POTASSIUM: 3.7 mmol/L (ref 3.5–5.3)
SODIUM: 141 mmol/L (ref 135–146)

## 2016-05-17 MED ORDER — ZOLPIDEM TARTRATE 5 MG PO TABS: 5.0000 mg | ORAL_TABLET | Freq: Every evening | ORAL | 3 refills | Status: DC | PRN

## 2016-05-17 MED ORDER — TADALAFIL 10 MG PO TABS: 10.0000 mg | ORAL_TABLET | ORAL | 1 refills | Status: DC | PRN

## 2016-05-17 MED ORDER — LISINOPRIL 40 MG PO TABS: 40.0000 mg | ORAL_TABLET | Freq: Every day | ORAL | 5 refills | Status: DC

## 2016-05-17 MED ORDER — HYDROCHLOROTHIAZIDE 25 MG PO TABS: 25.0000 mg | ORAL_TABLET | Freq: Every day | ORAL | 5 refills | Status: DC

## 2016-05-17 NOTE — Progress Notes (Signed)
BP has been running high 181/103- [facility] Out of HCTZ for one month Medication refill

## 2016-05-17 NOTE — Progress Notes (Signed)
Subjective:  Patient ID: Walter Hodges, male    DOB: 26-Feb-1956  Age: 60 y.o. MRN: [id number]  CC: Hypertension   HPI Walter Hodges is a 60 year old male with a history of hypertension, insomnia, history of bilateral knee replacement (left-01/2016, right-06/2015) who comes into the clinic requesting refills of his medication. He has been out of hydrochlorothiazide for the last month and reports elevated blood pressure.  He has completed rehabilitation and reports improvement in the function of his left knee; reports minimal pain and rarely having to use a cane.  Insomnia is controlled on Ambien.  He is requesting Cialis since he complains of erectile dysfunction. Also does have some intermittent chest twitching on the right side whenever he coughs but is absent at this time.  Past Medical History:  Diagnosis Date  . Arthritis   . Bell's palsy 2010   lasted one day ago, told that he had a ministroke  . Headache   . Hollenhorst plaque, right eye   . Hypertension    followed by Dr. Nita Sells"maryann" with [facility]      Past Surgical History:  Procedure Laterality Date  . CIRCUMCISION     done as a child- given inhalation   . JOINT REPLACEMENT Bilateral   . KNEE CLOSED REDUCTION Left 03/19/2016   Procedure: CLOSED MANIPULATION LEFT KNEE;  Surgeon: Tarry KosNaiping M Xu, MD;  Location: [facility] OR;  Service: Orthopedics;  Laterality: Left;  . TEE WITHOUT CARDIOVERSION N/A 02/19/2016   Procedure: TRANSESOPHAGEAL ECHOCARDIOGRAM (TEE);  Surgeon: Thurmon FairMihai Croitoru, MD;  [facility];  Service: Cardiovascular;  Laterality: N/A;  . TOTAL KNEE ARTHROPLASTY Right 05/26/2015   Procedure: RIGHT TOTAL KNEE ARTHROPLASTY;  Surgeon: Tarry KosNaiping M Xu, MD;  Location: [facility] OR;  Service: Orthopedics;  Laterality: Right;  . TOTAL KNEE ARTHROPLASTY Left 01/01/2016   Procedure: LEFT TOTAL KNEE ARTHROPLASTY;  Surgeon: Tarry KosNaiping M Xu, MD;  Location: [facility] OR;  Service: Orthopedics;  Laterality: Left;    Allergies  Allergen  Reactions  . No Known Allergies      Outpatient Medications Prior to Visit  Medication Sig Dispense Refill  . aspirin EC 325 MG tablet Take 1 tablet (325 mg total) by mouth 2 (two) times daily. 84 tablet 0  . lisinopril (PRINIVIL,ZESTRIL) 40 MG tablet Take 1 tablet (40 mg total) by mouth daily. 30 tablet 3  . ergocalciferol (DRISDOL) 50000 units capsule Take 1 capsule (50,000 Units total) by mouth once a week. (Patient not taking: Reported on 05/17/2016) 9 capsule 0  . polyethylene glycol (MIRALAX / GLYCOLAX) packet Take 17 g by mouth daily.    . hydrochlorothiazide (HYDRODIURIL) 25 MG tablet Take 1 tablet (25 mg total) by mouth daily. (Patient not taking: Reported on 05/17/2016) 30 tablet 2  . HYDROcodone-acetaminophen (NORCO) 5-325 MG tablet Take 1-2 tablets by mouth every 6 (six) hours as needed. (Patient not taking: Reported on 05/17/2016) 30 tablet 0  . magic mouthwash w/lidocaine SOLN Take 15 mLs by mouth 4 (four) times daily. (Patient not taking: Reported on 05/17/2016) 200 mL 0  . oxyCODONE (OXY IR/ROXICODONE) 5 MG immediate release tablet Take 5 mg by mouth every 4 (four) hours as needed for severe pain.    Marland Kitchen. zolpidem (AMBIEN) 5 MG tablet Take 1 tablet (5 mg total) by mouth at bedtime as needed for sleep. (Patient not taking: Reported on 05/17/2016) 30 tablet 0   No facility-administered medications prior to visit.     ROS Review of Systems  Constitutional: Negative for activity change  and appetite change.  HENT: Negative for sinus pressure and sore throat.   Eyes: Negative for visual disturbance.  Respiratory: Negative for cough, chest tightness and shortness of breath.   Cardiovascular: Negative for chest pain and leg swelling.  Gastrointestinal: Negative for abdominal distention, abdominal pain, constipation and diarrhea.  Endocrine: Negative.   Genitourinary: Negative for dysuria.  Musculoskeletal: Negative for joint swelling and myalgias.  Skin: Negative for rash.    Allergic/Immunologic: Negative.   Neurological: Negative for weakness, light-headedness and numbness.  Psychiatric/Behavioral: Negative for dysphoric mood and suicidal ideas.    Objective:  BP 134/69 (BP Location: Right Arm, Patient Position: Sitting, Cuff Size: Large)   Pulse 83   Temp 98.1 F (36.7 C) (Oral)   Ht 5\' 10"  (1.778 m)   Wt 244 lb 9.6 oz (110.9 kg)   SpO2 96%   BMI 35.10 kg/m   BP/Weight 05/17/2016 04/29/2016 03/22/2016  Systolic BP 134 169 124  Diastolic BP 69 94 90  Wt. (Lbs) 244.6 238 234.6  BMI 35.1 34.15 33.66      Physical Exam  Constitutional: He is oriented to person, place, and time. He appears well-developed and well-nourished.  Cardiovascular: Normal rate, normal heart sounds and intact distal pulses.   No murmur heard. Pulmonary/Chest: Effort normal and breath sounds normal. He has no wheezes. He has no rales. He exhibits no tenderness.  Abdominal: Soft. Bowel sounds are normal. He exhibits no distension and no mass. There is no tenderness.  Musculoskeletal:  Vertical Surgical scars on bilateral knee Mild edema left knee. No tenderness on range of motion.  Neurological: He is alert and oriented to person, place, and time.    BMET    Component Value Date/Time   NA 139 03/17/2016 0759   K 3.3 (L) 03/17/2016 0759   CL 105 03/17/2016 0759   CO2 28 03/17/2016 0759   GLUCOSE 99 03/17/2016 0759   BUN 13 03/17/2016 0759   CREATININE 0.72 03/17/2016 0759   CALCIUM 9.1 03/17/2016 0759   GFRNONAA >60 03/17/2016 0759   GFRAA >60 03/17/2016 0759     Assessment & Plan:   1. Insomnia Controlled - zolpidem (AMBIEN) 5 MG tablet; Take 1 tablet (5 mg total) by mouth at bedtime as needed for sleep.  Dispense: 30 tablet; Refill: 3  2. Essential hypertension Controlled Low-sodium diet - lisinopril (PRINIVIL,ZESTRIL) 40 MG tablet; Take 1 tablet (40 mg total) by mouth daily.  Dispense: 30 tablet; Refill: 5 - hydrochlorothiazide (HYDRODIURIL) 25 MG  tablet; Take 1 tablet (25 mg total) by mouth daily.  Dispense: 30 tablet; Refill: 5  3. Other male erectile dysfunction - tadalafil (CIALIS) 10 MG tablet; Take 1 tablet (10 mg total) by mouth every other day as needed for erectile dysfunction.  Dispense: 15 tablet; Refill: 1  4. Hypokalemia Last set of labs revealed hypokalemia; will repeat basic metabolic panel. - Basic Metabolic Panel   Meds ordered this encounter  Medications  . zolpidem (AMBIEN) 5 MG tablet    Sig: Take 1 tablet (5 mg total) by mouth at bedtime as needed for sleep.    Dispense:  30 tablet    Refill:  3  . lisinopril (PRINIVIL,ZESTRIL) 40 MG tablet    Sig: Take 1 tablet (40 mg total) by mouth daily.    Dispense:  30 tablet    Refill:  5  . hydrochlorothiazide (HYDRODIURIL) 25 MG tablet    Sig: Take 1 tablet (25 mg total) by mouth daily.    Dispense:  30 tablet    Refill:  5  . tadalafil (CIALIS) 10 MG tablet    Sig: Take 1 tablet (10 mg total) by mouth every other day as needed for erectile dysfunction.    Dispense:  15 tablet    Refill:  1    Follow-up: Return in about 3 months (around 08/16/2016) for follow up on hypertension.   Jaclyn Shaggy MD

## 2016-05-18 MED FILL — HYDROCHLOROTHIAZIDE 25 MG T: 25 | 30 days supply | Qty: 30 | Fill #0 | Status: TO

## 2016-05-21 ENCOUNTER — Telehealth: Payer: Self-pay

## 2016-05-21 NOTE — Telephone Encounter (Signed)
Writer called patient per Dr. Conley CanalFuches regarding his lab results.  Patient stated understanding however had several other questions.  He was wondering if Dr. Venetia NightAmao could prescribe another medication other then cialis which would cost "$900 out of pocket for #15 or $75 per pill"- his medicaid will not pay anything. Patient also is taking 5 mg of ambien nightly which is not allowing him to sleep.  He was on 10 mg prior to the decrease and was able to sleep.  Patient will double up on the 5 mg which will only last him for 15 days and is hopeful that a 10 mg prescription will be sent to the pharmacy.

## 2016-05-21 NOTE — Telephone Encounter (Signed)
-----   Message from Dessa PhiJosalyn Funches, MD sent at 05/20/2016  6:05 PM EDT ----- potassium improved to 3.7 up from 3.3 two months ago Continue current regimen with plan for repeat metabolic in 6 months sooner if he becomes symptomatic Keep in mind that HCTZ lowers serum K+ but lisinopril tends to raise it, so they two meds should balance each other out

## 2016-05-24 NOTE — Telephone Encounter (Signed)
I did explain to him that erectile dysfunction medications would cost about the same and most likely would not  be covered by insurance companies. He will need to find out from his pharmacist or his insurance company if there is a cheaper alternative. We had discussed Ambien previously and I had informed him I would be keeping him on 5mg .

## 2016-05-27 NOTE — Telephone Encounter (Signed)
Clld pt - advsd of PCP's notation. Pt stated he understood.

## 2016-07-05 ENCOUNTER — Ambulatory Visit (INDEPENDENT_AMBULATORY_CARE_PROVIDER_SITE_OTHER): Payer: Medicaid Other

## 2016-07-05 ENCOUNTER — Encounter (INDEPENDENT_AMBULATORY_CARE_PROVIDER_SITE_OTHER): Payer: Self-pay | Admitting: Orthopaedic Surgery

## 2016-07-05 ENCOUNTER — Telehealth (INDEPENDENT_AMBULATORY_CARE_PROVIDER_SITE_OTHER): Payer: Self-pay | Admitting: *Deleted

## 2016-07-05 ENCOUNTER — Ambulatory Visit (INDEPENDENT_AMBULATORY_CARE_PROVIDER_SITE_OTHER): Payer: Medicaid Other | Admitting: Orthopaedic Surgery

## 2016-07-05 DIAGNOSIS — M25511 Pain in right shoulder: Secondary | ICD-10-CM | POA: Diagnosis not present

## 2016-07-05 DIAGNOSIS — M25561 Pain in right knee: Secondary | ICD-10-CM

## 2016-07-05 DIAGNOSIS — M25519 Pain in unspecified shoulder: Secondary | ICD-10-CM | POA: Insufficient documentation

## 2016-07-05 DIAGNOSIS — M25562 Pain in left knee: Secondary | ICD-10-CM

## 2016-07-05 DIAGNOSIS — Z96653 Presence of artificial knee joint, bilateral: Secondary | ICD-10-CM | POA: Diagnosis not present

## 2016-07-05 DIAGNOSIS — G8929 Other chronic pain: Secondary | ICD-10-CM | POA: Diagnosis not present

## 2016-07-05 MED ORDER — ZOLPIDEM TARTRATE 10 MG PO TABS: 10.0000 mg | ORAL_TABLET | Freq: Every evening | ORAL | 0 refills | Status: DC | PRN

## 2016-07-05 NOTE — Progress Notes (Signed)
Office Visit Note   Patient: Walter Hodges           Date of Birth: 1956-01-21           MRN: [id number] Visit Date: 07/05/2016              Requested by: Jaclyn ShaggyEnobong Amao, MD [address] [place], [place] [id number] PCP: Jaclyn ShaggyEnobong, Amao, MD   Assessment & Plan: Visit Diagnoses:  1. Chronic pain of both knees   2. Acute pain of right shoulder   3. Status post total bilateral knee replacement     Plan:  - TKAs sound like they're doing well.  He's improving - ambien refilled - last one from us - needs establish care with PCP - right shoulder MRI to r/o RCT - f/u p MRI  Follow-Up Instructions: Return in about 2 weeks (around 07/19/2016) for follow up right shoulder MRI.   Orders:  Orders Placed This Encounter  Procedures  . XR Knee 1-2 Views Left  . XR Knee 1-2 Views Right  . XR Shoulder Right  . MR Shoulder Left w/o contrast   Meds ordered this encounter  Medications  . zolpidem (AMBIEN) 10 MG tablet    Sig: Take 1 tablet (10 mg total) by mouth at bedtime as needed for sleep.    Dispense:  30 tablet    Refill:  0      Procedures: No procedures performed   Clinical Data: No additional findings.   Subjective: Chief Complaint  Patient presents with  . Right Knee - Follow-up  . Left Knee - Follow-up    Following up for B TKA, improving with good and bad days.  Doing HEP.  Overall improving and happy with progress.  Right shoulder continues to hurt with tingling and numbness down to the hand.  Denies neck pain.      Review of Systems  All other systems reviewed and are negative.    Objective: Vital Signs: There were no vitals taken for this visit.  Physical Exam  Constitutional: He appears well-developed and well-nourished.  HENT:  Head: Atraumatic.  Eyes: EOM are normal.  Neck: Neck supple.  Nursing note and vitals reviewed.   Right Knee Exam   Comments:  Small effusion.  Excellent ROM   Left Knee Exam   Comments:  ROM improving.  No  effusion   Right Shoulder Exam   Comments:  Exam stable      Specialty Comments:  No specialty comments available.  Imaging: No results found.   PMFS History: Patient Active Problem List   Diagnosis Date Noted  . Acute pain of right shoulder 07/05/2016  . 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 Chronic pain of both knees 03/19/2016  . Embolism involving retinal artery   . Insomnia 02/16/2016  . Hollenhorst plaque, right eye 02/16/2016  . Broken tooth, complicated 01/23/2016  . Hypertension 01/07/2016  . Total knee replacement status 01/01/2016  . Primary osteoarthritis of right knee 05/26/2015   Past Medical History:  Diagnosis Date  . Arthritis   . Bell's palsy 2010   lasted one day ago, told that he had a ministroke  . Headache   . Hollenhorst plaque, right eye   . Hypertension    followed by Dr. Nita Sells"maryann" with [facility]      No family history on file.  Past Surgical History:  Procedure Laterality Date  . CIRCUMCISION     done as a child-  given inhalation   . JOINT REPLACEMENT Bilateral   . KNEE CLOSED REDUCTION Left 03/19/2016   Procedure: CLOSED MANIPULATION LEFT KNEE;  Surgeon: Tarry KosNaiping M Xu, MD;  Location: [facility] OR;  Service: Orthopedics;  Laterality: Left;  . TEE WITHOUT CARDIOVERSION N/A 02/19/2016   Procedure: TRANSESOPHAGEAL ECHOCARDIOGRAM (TEE);  Surgeon: Thurmon FairMihai Croitoru, MD;  [facility];  Service: Cardiovascular;  Laterality: N/A;  . TOTAL KNEE ARTHROPLASTY Right 05/26/2015   Procedure: RIGHT TOTAL KNEE ARTHROPLASTY;  Surgeon: Tarry KosNaiping M Xu, MD;  Location: [facility] OR;  Service: Orthopedics;  Laterality: Right;  . TOTAL KNEE ARTHROPLASTY Left 01/01/2016   Procedure: LEFT TOTAL KNEE ARTHROPLASTY;  Surgeon: Tarry KosNaiping M Xu, MD;  Location: [facility] OR;  Service: Orthopedics;  Laterality: Left;   Social History   Occupational History  . Not on file.   Social History Main Topics  . Smoking status: Never Smoker  .  Smokeless tobacco: Never Used  . Alcohol use No  . Drug use: No  . Sexual activity: Not on file

## 2016-07-05 NOTE — Telephone Encounter (Signed)
Pt. Called back about this medication.

## 2016-07-05 NOTE — Telephone Encounter (Signed)
I had left message on machine rx ready for pick up at the front desk. Pt aware

## 2016-07-05 NOTE — Telephone Encounter (Signed)
Pt. Called stating he just left and forgot his prescription. [facility] [place] on [address] Pt. Callback number [phone].

## 2016-07-07 NOTE — Addendum Note (Signed)
Addended by: Elvina MattesSIMMONS, Colon Rueth T on: 07/07/2016 10:22 AM   Modules accepted: Orders

## 2016-07-11 ENCOUNTER — Ambulatory Visit
Admission: RE | Admit: 2016-07-11 | Discharge: 2016-07-11 | Disposition: A | Discharge status: Home / Self Care | Payer: Medicaid Other | Source: Ambulatory Visit | Attending: Orthopaedic Surgery | Admitting: Orthopaedic Surgery

## 2016-07-11 DIAGNOSIS — M25511 Pain in right shoulder: Secondary | ICD-10-CM

## 2016-07-12 ENCOUNTER — Ambulatory Visit (INDEPENDENT_AMBULATORY_CARE_PROVIDER_SITE_OTHER): Payer: Medicaid Other | Admitting: Orthopaedic Surgery

## 2016-07-12 ENCOUNTER — Telehealth (INDEPENDENT_AMBULATORY_CARE_PROVIDER_SITE_OTHER): Payer: Self-pay | Admitting: *Deleted

## 2016-07-12 ENCOUNTER — Ambulatory Visit (INDEPENDENT_AMBULATORY_CARE_PROVIDER_SITE_OTHER): Payer: Medicaid Other | Admitting: Surgery

## 2016-07-12 ENCOUNTER — Encounter (INDEPENDENT_AMBULATORY_CARE_PROVIDER_SITE_OTHER): Payer: Self-pay | Admitting: Orthopaedic Surgery

## 2016-07-12 DIAGNOSIS — M25511 Pain in right shoulder: Secondary | ICD-10-CM | POA: Diagnosis not present

## 2016-07-12 NOTE — Telephone Encounter (Signed)
Pt. Called stating he needed to leave a message for the nurse to call him back. He would not disclose what he was calling for.

## 2016-07-12 NOTE — Progress Notes (Signed)
Office Visit Note   Patient: Walter Hodges           Date of Birth: [birth year]           MRN: [id number] Visit Date: 07/12/2016              Requested by: Jaclyn Shaggy, MD [address] [place], [place] [address] PCP: Jaclyn Shaggy, MD   Assessment & Plan: Visit Diagnoses:  1. Acute pain of right shoulder     Plan:  - MRI shows DJD of GH joint - RC is very strong and not symptomatic - recommend intraarticular injection with Dr. Alvester Morin - f/u 6 weeks for recheck  Follow-Up Instructions: Return in about 6 weeks (around 08/23/2016) for recheck right shoulder.   Orders:  Orders Placed This Encounter  Procedures  . Ambulatory referral to Physical Medicine Rehab   No orders of the defined types were placed in this encounter.     Procedures: No procedures performed   Clinical Data: No additional findings.   Subjective: Chief Complaint  Patient presents with  . Right Knee - Pain, Follow-up  . Left Knee - Pain, Follow-up  . Right Shoulder - Pain, Follow-up    F/u visit for right shoulder pain.  Denies any stiffness.  Pain is anterior.    Review of Systems   Objective: Vital Signs: There were no vitals taken for this visit.  Physical Exam  Right Shoulder Exam   Tenderness  The patient is experiencing no tenderness.    Range of Motion  The patient has normal right shoulder ROM.  Muscle Strength  The patient has normal right shoulder strength.  Tests  Apprehension: negative Cross Arm: negative Drop Arm: negative Hawkin's test: negative Impingement: negative  Other  Erythema: absent Sensation: normal Pulse: present      Specialty Comments:  No specialty comments available.  Imaging: No results found.   PMFS History: Patient Active Problem List   Diagnosis Date Noted  . Acute pain of right shoulder 07/05/2016  . 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 Chronic pain of both knees  03/19/2016  . Embolism involving retinal artery   . Insomnia 02/16/2016  . Hollenhorst plaque, right eye 02/16/2016  . Broken tooth, complicated 01/23/2016  . Hypertension 01/07/2016  . Total knee replacement status 01/01/2016  . Primary osteoarthritis of right knee 05/26/2015   Past Medical History:  Diagnosis Date  . Arthritis   . Bell's palsy 2010   lasted one day ago, told that he had a ministroke  . Headache   . Hollenhorst plaque, right eye   . Hypertension    followed by Dr. Nita Sells" with [facility]      No family history on file.  Past Surgical History:  Procedure Laterality Date  . CIRCUMCISION     done as a child- given inhalation   . JOINT REPLACEMENT Bilateral   . KNEE CLOSED REDUCTION Left 03/19/2016   Procedure: CLOSED MANIPULATION LEFT KNEE;  Surgeon: Tarry Kos, MD;  [facility];  Service: Orthopedics;  Laterality: Left;  . TEE WITHOUT CARDIOVERSION N/A 02/19/2016   Procedure: TRANSESOPHAGEAL ECHOCARDIOGRAM (TEE);  Surgeon: Thurmon Fair, MD;  Location: [facility] ENDOSCOPY;  Service: Cardiovascular;  Laterality: N/A;  . TOTAL KNEE ARTHROPLASTY Right 05/26/2015   Procedure: RIGHT TOTAL KNEE ARTHROPLASTY;  Surgeon: Tarry Kos, MD;  Location: [facility] OR;  Service: Orthopedics;  Laterality: Right;  . TOTAL KNEE ARTHROPLASTY Left 01/01/2016   Procedure:  LEFT TOTAL KNEE ARTHROPLASTY;  Surgeon: Tarry Kos, MD;  Location: [facility] OR;  Service: Orthopedics;  Laterality: Left;   Social History   Occupational History  . Not on file.   Social History Main Topics  . Smoking status: Never Smoker  . Smokeless tobacco: Never Used  . Alcohol use No  . Drug use: No  . Sexual activity: Not on file

## 2016-07-19 ENCOUNTER — Encounter (INDEPENDENT_AMBULATORY_CARE_PROVIDER_SITE_OTHER): Payer: Self-pay | Admitting: Physical Medicine and Rehabilitation

## 2016-07-19 ENCOUNTER — Ambulatory Visit (INDEPENDENT_AMBULATORY_CARE_PROVIDER_SITE_OTHER): Payer: Medicaid Other | Admitting: Physical Medicine and Rehabilitation

## 2016-07-19 VITALS — BP 147/85 | HR 68

## 2016-07-19 DIAGNOSIS — M25511 Pain in right shoulder: Secondary | ICD-10-CM | POA: Diagnosis not present

## 2016-07-19 DIAGNOSIS — G8929 Other chronic pain: Secondary | ICD-10-CM

## 2016-07-19 MED ORDER — LIDOCAINE HCL 2 % IJ SOLN
4.0000 mL | INTRAMUSCULAR | Status: AC | PRN
  Administered 2016-07-19: 4 mL

## 2016-07-19 MED ORDER — TRIAMCINOLONE ACETONIDE 40 MG/ML IJ SUSP
80.0000 mg | INTRAMUSCULAR | Status: AC | PRN
  Administered 2016-07-19: 80 mg via INTRA_ARTICULAR

## 2016-07-19 NOTE — Patient Instructions (Signed)

## 2016-07-19 NOTE — Progress Notes (Signed)
Walter Hodges - 60 y.o. male MRN [id number]  Date of birth: 1956/03/07  Office Visit Note: Visit Date: 07/19/2016 PCP: Jaclyn Shaggy, MD Referred by: Jaclyn Shaggy, MD  Subjective: Chief Complaint  Patient presents with  . Right Shoulder - Pain   HPI: Mr. Walter Hodges is a 60 year old gentleman complaining of right shoulder pain for months. His shoulder pain has progressively gotten worse. He has not gotten any relief with conservative care.Hurts to raise arm. Nondermatomal numbness and tingling in arm and radiates into hand and fingers.    ROS Otherwise per HPI.  Assessment & Plan: Visit Diagnoses:  1. Chronic right shoulder pain     Plan: Findings:  Plan today is to complete a diagnostic and therapeutic anesthetic right shoulder arthrogram.    Meds & Orders: No orders of the defined types were placed in this encounter.   Orders Placed This Encounter  Procedures  . Large Joint Injection/Arthrocentesis    Follow-up: Return for Scheduled to see Dr. Roda Shutters in 4 weeks.   Procedures: Glenohumeral joint anesthetic arthrogram Date/Time: 07/19/2016 10:55 AM Performed by: Tyrell Antonio Authorized by: Tyrell Antonio   Consent Given by:  Patient Site marked: the procedure site was marked   Timeout: prior to procedure the correct patient, procedure, and site was verified   Indications:  Pain Location:  Shoulder Site:  R glenohumeral Prep: patient was prepped and draped in usual sterile fashion   Needle Size:  22 G Needle Length:  3.5 inches Approach:  Anteromedial Ultrasound Guidance: No   Fluoroscopic Guidance: No   Arthrogram: Yes   Medications:  80 mg triamcinolone acetonide 40 MG/ML; 4 mL lidocaine 2 % Aspiration Attempted: No   Patient tolerance:  Patient tolerated the procedure well with no immediate complications  Arthrogram demonstrated excellent flow of contrast throughout the joint surface with some superior extravasation through the rotator cuff tendon..  The  patient had relief of symptoms during the anesthetic phase of the injection.     No notes on file   Clinical History: No specialty comments available.  He reports that he has never smoked. He has never used smokeless tobacco.   Recent Labs  01/07/16 1543  HGBA1C 5.8    Objective:  VS:  HT:    WT:   BMI:     BP:(!) 147/85  HR:68bpm  TEMP: ( )  RESP:  Physical Exam  Musculoskeletal:  The patient has right-sided shoulder impingement signs. He has intact sensation to light touch.    Ortho Exam Imaging: No results found.  Past Medical/Family/Surgical/Social History: Medications & Allergies reviewed per EMR Patient Active Problem List   Diagnosis Date Noted  . Acute pain of right shoulder 07/05/2016  . 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 Chronic pain of both knees 03/19/2016  . Embolism involving retinal artery   . Insomnia 02/16/2016  . Hollenhorst plaque, right eye 02/16/2016  . Broken tooth, complicated 01/23/2016  . Hypertension 01/07/2016  . Total knee replacement status 01/01/2016  . Primary osteoarthritis of right knee 05/26/2015   Past Medical History:  Diagnosis Date  . Arthritis   . Bell's palsy 2010   lasted one day ago, told that he had a ministroke  . Headache   . Hollenhorst plaque, right eye   . Hypertension    followed by Dr. Nita Sells" with [facility]     History reviewed. No pertinent family history. Past Surgical History:  Procedure Laterality Date  . CIRCUMCISION  done as a child- given inhalation   . JOINT REPLACEMENT Bilateral   . KNEE CLOSED REDUCTION Left 03/19/2016   Procedure: CLOSED MANIPULATION LEFT KNEE;  Surgeon: Tarry Kos, MD;  [facility]: [facility];  Service: Orthopedics;  Laterality: Left;  . TEE WITHOUT CARDIOVERSION N/A 02/19/2016   Procedure: TRANSESOPHAGEAL ECHOCARDIOGRAM (TEE);  Surgeon: Thurmon Fair, MD;  Location: [facility] ENDOSCOPY;  Service: Cardiovascular;  Laterality: N/A;    . TOTAL KNEE ARTHROPLASTY Right 05/26/2015   Procedure: RIGHT TOTAL KNEE ARTHROPLASTY;  Surgeon: Tarry Kos, MD;  Location: [facility] OR;  Service: Orthopedics;  Laterality: Right;  . TOTAL KNEE ARTHROPLASTY Left 01/01/2016   Procedure: LEFT TOTAL KNEE ARTHROPLASTY;  Surgeon: Tarry Kos, MD;  Location: [facility] OR;  Service: Orthopedics;  Laterality: Left;   Social History   Occupational History  . Not on file.   Social History Main Topics  . Smoking status: Never Smoker  . Smokeless tobacco: Never Used  . Alcohol use No  . Drug use: No  . Sexual activity: Not on file

## 2016-07-22 ENCOUNTER — Emergency Department
Admission: EM | Admit: 2016-07-22 | Discharge: 2016-07-22 | Disposition: A | Discharge status: Home / Self Care | Payer: Medicaid Other | Attending: Emergency Medicine | Admitting: Emergency Medicine

## 2016-07-22 ENCOUNTER — Encounter: Payer: Self-pay | Admitting: Emergency Medicine

## 2016-07-22 DIAGNOSIS — K0889 Other specified disorders of teeth and supporting structures: Secondary | ICD-10-CM | POA: Diagnosis present

## 2016-07-22 DIAGNOSIS — K120 Recurrent oral aphthae: Secondary | ICD-10-CM | POA: Diagnosis not present

## 2016-07-22 DIAGNOSIS — Z96653 Presence of artificial knee joint, bilateral: Secondary | ICD-10-CM | POA: Insufficient documentation

## 2016-07-22 DIAGNOSIS — Z7982 Long term (current) use of aspirin: Secondary | ICD-10-CM | POA: Diagnosis not present

## 2016-07-22 DIAGNOSIS — I10 Essential (primary) hypertension: Secondary | ICD-10-CM | POA: Diagnosis not present

## 2016-07-22 MED ORDER — LIDOCAINE VISCOUS 2 % MT SOLN
30.0000 mL | Freq: Once | OROMUCOSAL | Status: AC
  Administered 2016-07-22: 30 mL via OROMUCOSAL
  Filled 2016-07-22: qty 30

## 2016-07-22 MED ORDER — PREDNISOLONE SODIUM PHOSPHATE 15 MG/5ML PO SOLN
60.0000 mg | Freq: Once | ORAL | Status: AC
  Administered 2016-07-22: 60 mg via ORAL
  Filled 2016-07-22: qty 20

## 2016-07-22 MED ORDER — DIPHENHYDRAMINE HCL 12.5 MG/5ML PO ELIX
50.0000 mg | ORAL_SOLUTION | Freq: Once | ORAL | Status: AC
  Administered 2016-07-22: 50 mg via ORAL
  Filled 2016-07-22: qty 20

## 2016-07-22 MED ORDER — MAGIC MOUTHWASH W/LIDOCAINE: 5.0000 mL | Freq: Four times a day (QID) | ORAL | 0 refills | Status: DC

## 2016-07-22 NOTE — ED Notes (Signed)
See triage note  State sis is having ulcer in mouth and increased pain

## 2016-07-22 NOTE — Discharge Instructions (Signed)
Advised to discuss with pharmacist the appropriate soaking solution for dentures.

## 2016-07-22 NOTE — ED Triage Notes (Signed)
Pt to ED from home c/o mouth pain x1 week.  States recently treated for ulcers in mouth with relief after treatment.  Pt denies SOB, denies trouble swallowing.

## 2016-07-22 NOTE — ED Provider Notes (Signed)
[facility] Emergency Department Provider Note   ____________________________________________   None    (approximate)  I have reviewed the triage vital signs and the nursing notes.   HISTORY  Chief Complaint Dental Pain    HPI Walter Hodges is a 60 y.o. male patient complain of tongue and upper lip pain secondary to ulcers. Patient states  had these lesions in the past and responded well to Magic mouthwash. Patient has upper and lower partial dental prosthesis which are not properly maintained. Patient is rating his pain as a 7/10. Patient describes pain as "burning and sharp". No palliative measures for this complaint.   Past Medical History:  Diagnosis Date  . Arthritis   . Bell's palsy 2010   lasted one day ago, told that he had a ministroke  . Headache   . Hollenhorst plaque, right eye   . Hypertension    followed by Dr. Nita Sells"maryann" with [facility]      Patient Active Problem List   Diagnosis Date Noted  . Acute pain of right shoulder 07/05/2016  . 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 Chronic pain of both knees 03/19/2016  . Embolism involving retinal artery   . Insomnia 02/16/2016  . Hollenhorst plaque, right eye 02/16/2016  . Broken tooth, complicated 01/23/2016  . Hypertension 01/07/2016  . Total knee replacement status 01/01/2016  . Primary osteoarthritis of right knee 05/26/2015    Past Surgical History:  Procedure Laterality Date  . CIRCUMCISION     done as a child- given inhalation   . JOINT REPLACEMENT Bilateral   . KNEE CLOSED REDUCTION Left 03/19/2016   Procedure: CLOSED MANIPULATION LEFT KNEE;  Surgeon: Tarry KosNaiping M Xu, MD;  Location: [facility] OR;  Service: Orthopedics;  Laterality: Left;  . TEE WITHOUT CARDIOVERSION N/A 02/19/2016   Procedure: TRANSESOPHAGEAL ECHOCARDIOGRAM (TEE);  Surgeon: Thurmon FairMihai Croitoru, MD;  [facility] ENDOSCOPY;  Service: Cardiovascular;  Laterality: N/A;  .  TOTAL KNEE ARTHROPLASTY Right 05/26/2015   Procedure: RIGHT TOTAL KNEE ARTHROPLASTY;  Surgeon: Tarry KosNaiping M Xu, MD;  Location: [facility] OR;  Service: Orthopedics;  Laterality: Right;  . TOTAL KNEE ARTHROPLASTY Left 01/01/2016   Procedure: LEFT TOTAL KNEE ARTHROPLASTY;  Surgeon: Tarry KosNaiping M Xu, MD;  Location: [facility] OR;  Service: Orthopedics;  Laterality: Left;    Prior to Admission medications   Medication Sig Start Date End Date Taking? Authorizing Provider  aspirin EC 325 MG tablet Take 1 tablet (325 mg total) by mouth 2 (two) times daily. 01/01/16   Tarry KosNaiping M Xu, MD  ergocalciferol (DRISDOL) 50000 units capsule Take 1 capsule (50,000 Units total) by mouth once a week. 03/17/16   Jaclyn ShaggyEnobong Amao, MD  hydrochlorothiazide (HYDRODIURIL) 25 MG tablet Take 1 tablet (25 mg total) by mouth daily. 05/17/16   Jaclyn ShaggyEnobong Amao, MD  lisinopril (PRINIVIL,ZESTRIL) 40 MG tablet Take 1 tablet (40 mg total) by mouth daily. 05/17/16   Jaclyn ShaggyEnobong Amao, MD  magic mouthwash w/lidocaine SOLN Take 5 mLs by mouth 4 (four) times daily. 07/22/16   Joni Reiningonald K Swayzie Choate, PA-C  polyethylene glycol [facility](MIRALAX / GLYCOLAX) packet Take 17 g by mouth daily.    Historical Provider, MD  tadalafil (CIALIS) 10 MG tablet Take 1 tablet (10 mg total) by mouth every other day as needed for erectile dysfunction. 05/17/16   Jaclyn ShaggyEnobong Amao, MD  zolpidem (AMBIEN) 10 MG tablet Take 1 tablet (10 mg total) by mouth at bedtime as needed for sleep. 07/05/16 08/04/16  Naiping Donnelly StagerM Xu,  MD  zolpidem (AMBIEN) 5 MG tablet Take 1 tablet (5 mg total) by mouth at bedtime as needed for sleep. 05/17/16 12/12/16  Jaclyn Shaggy, MD    Allergies No known allergies  History reviewed. No pertinent family history.  Social History Social History  Substance Use Topics  . Smoking status: Never Smoker  . Smokeless tobacco: Never Used  . Alcohol use No    Review of Systems Constitutional: No fever/chills Eyes: No visual changes. ENT: No sore throat. Painful oral lesions Cardiovascular: Denies  chest pain. Respiratory: Denies shortness of breath. Gastrointestinal: No abdominal pain.  No nausea, no vomiting.  No diarrhea.  No constipation. Genitourinary: Negative for dysuria. Musculoskeletal: Negative for back pain. Skin: Negative for rash. Neurological: Negative for headaches, focal weakness or numbness. Endocrine: Hypertension  ____________________________________________   PHYSICAL EXAM:  VITAL SIGNS: ED Triage Vitals  Enc Vitals Group     BP 07/22/16 1400 (!) 141/87     Pulse Rate 07/22/16 1400 75     Resp 07/22/16 1400 16     Temp 07/22/16 1400 98.1 F (36.7 C)     Temp Source 07/22/16 1400 Oral     SpO2 07/22/16 1400 98 %     Weight 07/22/16 1400 240 lb (108.9 kg)     Height 07/22/16 1400 5\' 10"  (1.778 m)     Head Circumference --      Peak Flow --      Pain Score 07/22/16 1401 7     Pain Loc --      Pain Edu? --      Excl. in GC? --     Constitutional: Alert and oriented. Well appearing and in no acute distress. Eyes: Conjunctivae are normal. PERRL. EOMI. Head: Atraumatic. Nose: No congestion/rhinnorhea. Mouth/Throat: Mucous membranes are moist.  Oropharynx non-erythematous. Neck: No stridor.  No cervical spine tenderness to palpation. Hematological/Lymphatic/Immunilogical: No cervical lymphadenopathy. Cardiovascular: Normal rate, regular rhythm. Grossly normal heart sounds.  Good peripheral circulation. Respiratory: Normal respiratory effort.  No retractions. Lungs CTAB. Gastrointestinal: Soft and nontender. No distention. No abdominal bruits. No CVA tenderness. Musculoskeletal: No lower extremity tenderness nor edema.  No joint effusions. Neurologic:  Normal speech and language. No gross focal neurologic deficits are appreciated. No gait instability. Skin:  Skin is warm, dry and intact. No rash noted. Psychiatric: Mood and affect are normal. Speech and behavior are normal.  ____________________________________________   LABS (all labs ordered are  listed, but only abnormal results are displayed)  Labs Reviewed - No data to display ____________________________________________  EKG   ____________________________________________  RADIOLOGY   ____________________________________________   PROCEDURES  Procedure(s) performed: None  Procedures  Critical Care performed: No  ____________________________________________   INITIAL IMPRESSION / ASSESSMENT AND PLAN / ED COURSE  Pertinent labs & imaging results that were available during my care of the patient were reviewed by me and considered in my medical decision making (see chart for details).  Aphthous ulcer and dental pain. Patient given discharge care instructions. Patient given a prescription for Magic mouthwash. Patient advised to follow-up with dental clinic for evaluation of erosion of dental prosthesis.  Clinical Course      ____________________________________________   FINAL CLINICAL IMPRESSION(S) / ED DIAGNOSES  Final diagnoses:  Oral aphthous ulcer  Pain, dental      NEW MEDICATIONS STARTED DURING THIS VISIT:  New Prescriptions   MAGIC MOUTHWASH W/LIDOCAINE SOLN    Take 5 mLs by mouth 4 (four) times daily.     Note:  This document  was prepared using Conservation officer, historic buildingsDragon voice recognition software and may include unintentional dictation errors.    Joni Reiningonald K Charlee Whitebread, PA-C 07/22/16 1438    Emily FilbertJonathan E Williams, MD 07/22/16 [phone]

## 2016-08-03 ENCOUNTER — Telehealth (INDEPENDENT_AMBULATORY_CARE_PROVIDER_SITE_OTHER): Payer: Self-pay | Admitting: *Deleted

## 2016-08-03 NOTE — Telephone Encounter (Signed)
Pt called back to speak with liz, I advised him she was out of the office and it would possible be tomorrrow before he got a call back. I asked if I could advise someone else of what he was calling for and he said he ok to wait for liz. Pt would not disclose what he was calling for.

## 2016-08-04 ENCOUNTER — Telehealth (INDEPENDENT_AMBULATORY_CARE_PROVIDER_SITE_OTHER): Payer: Self-pay | Admitting: Orthopaedic Surgery

## 2016-08-04 NOTE — Telephone Encounter (Signed)
Patient calling to provide LIZ with Fax# to the pain center  [phone]  Attn:  Robin   Patient states this is urgent as he has been waiting to hear something from the pain clinic since Sept.  He states they will not make appt until they have received the referral.

## 2016-08-04 NOTE — Telephone Encounter (Signed)
Patient states he wants to check status for pain clinic referral was made back in sep and pending appt pt [facility]

## 2016-08-04 NOTE — Telephone Encounter (Signed)
[facility]

## 2016-08-09 ENCOUNTER — Other Ambulatory Visit (INDEPENDENT_AMBULATORY_CARE_PROVIDER_SITE_OTHER): Payer: Self-pay | Admitting: Orthopaedic Surgery

## 2016-08-10 ENCOUNTER — Telehealth: Payer: Self-pay | Admitting: Family Medicine

## 2016-08-10 NOTE — Telephone Encounter (Signed)
Patient is needing Ambien. Please follow up

## 2016-08-11 ENCOUNTER — Telehealth: Payer: Self-pay

## 2016-08-11 IMAGING — CR DG KNEE 1-2V PORT*L*
2 series · 2 of 2 positions shown · non-contrast
Comparison: 03/15/2016

CLINICAL DATA: Status post close reduction

EXAM:
PORTABLE LEFT KNEE - 1-2 VIEW

[AP]
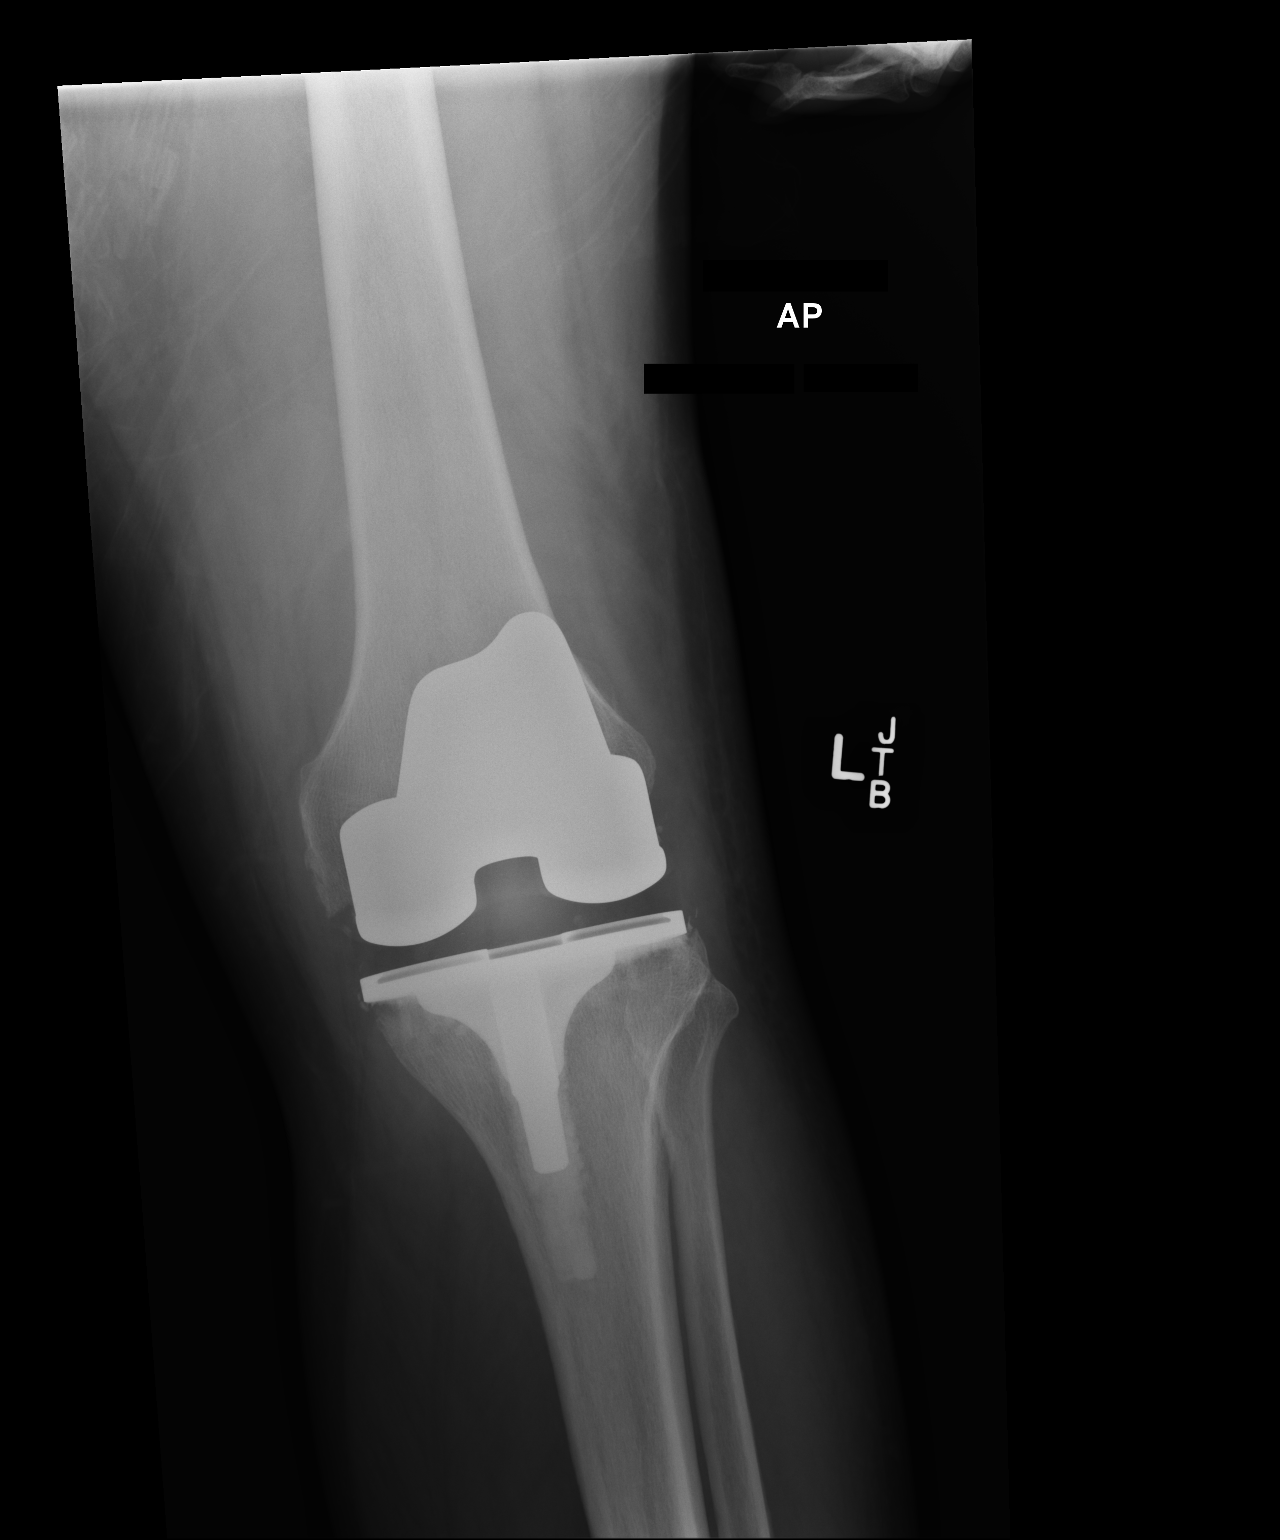

[xtable lateral]
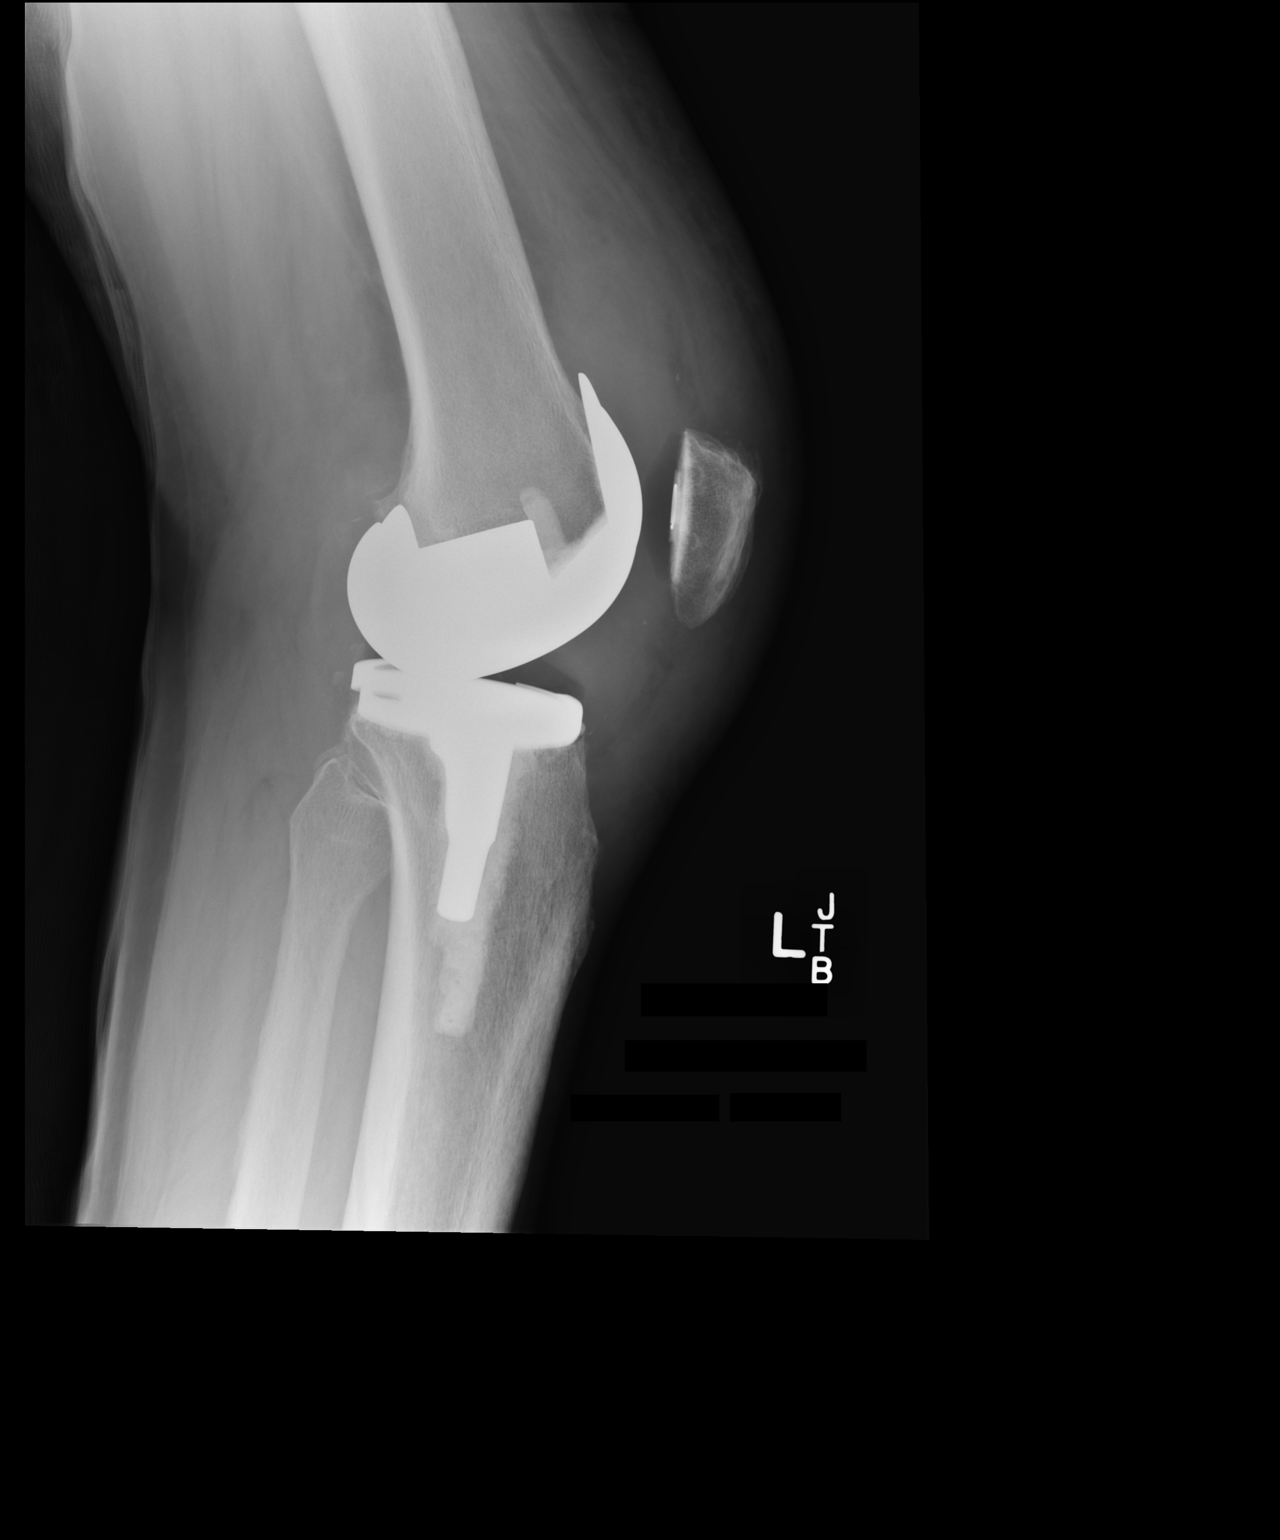

[2 of 2 positions shown; findings below may reference images not displayed]

FINDINGS: Left knee prosthesis is again identified and stable. A small joint
effusion is noted.
IMPRESSION: Small joint effusion

## 2016-08-11 NOTE — Telephone Encounter (Signed)
He received a prescription for Ambien #30 pills with 3 refills on 05/17/16,which should last him until 09/16/2016

## 2016-08-11 NOTE — Telephone Encounter (Signed)
Writer called patient and LVM that his Remus Lofflerambien has 3 refills which should last him through until January 11.  Writer encouraged him to call with questions.

## 2016-08-16 ENCOUNTER — Encounter (INDEPENDENT_AMBULATORY_CARE_PROVIDER_SITE_OTHER): Payer: Self-pay | Admitting: Orthopaedic Surgery

## 2016-08-16 ENCOUNTER — Ambulatory Visit (INDEPENDENT_AMBULATORY_CARE_PROVIDER_SITE_OTHER): Payer: Self-pay

## 2016-08-16 ENCOUNTER — Ambulatory Visit (INDEPENDENT_AMBULATORY_CARE_PROVIDER_SITE_OTHER): Payer: Medicaid Other | Admitting: Orthopaedic Surgery

## 2016-08-16 ENCOUNTER — Ambulatory Visit (INDEPENDENT_AMBULATORY_CARE_PROVIDER_SITE_OTHER): Payer: Medicaid Other

## 2016-08-16 DIAGNOSIS — M25561 Pain in right knee: Secondary | ICD-10-CM

## 2016-08-16 DIAGNOSIS — M25562 Pain in left knee: Secondary | ICD-10-CM | POA: Diagnosis not present

## 2016-08-16 DIAGNOSIS — G8929 Other chronic pain: Secondary | ICD-10-CM

## 2016-08-16 NOTE — Progress Notes (Signed)
Office Visit Note   Patient: Walter Hodges           Date of Birth: 24-Sep-1955           MRN: [id number] Visit Date: 08/16/2016              Requested by: Jaclyn Shaggy, MD [address] [place], [place] [address] PCP: Jaclyn Shaggy, MD   Assessment & Plan: Visit Diagnoses:  1. Chronic pain of both knees     Plan: X-rays were reviewed with the patient which showed stable total knee replacements. Reassurances given. Patient does want narcotic pain medicines today but I did tell him that I cannot refill any.. He does have a pain clinic appointment, not. I will see him back in 1 year for follow-up of bilateral knees with 2 view x-rays of bilateral knees.  Follow-Up Instructions: Return in about 1 year (around 08/16/2017).   Orders:  Orders Placed This Encounter  Procedures  . XR KNEE 3 VIEW RIGHT  . XR KNEE 3 VIEW LEFT   No orders of the defined types were placed in this encounter.     Procedures: No procedures performed   Clinical Data: No additional findings.   Subjective: Chief Complaint  Patient presents with  . Right Shoulder - Pain  . Left Knee - Pain  . Right Knee - Pain    Patient comes in today for bilateral knee pain right shoulder pain. He is doing much better from the shoulder injection by Dr. Alvester Morin. He has chronic pain in both his knees. He says the pain is worse with cold weather.    Review of Systems  Constitutional: Negative.   HENT: Negative.   Eyes: Negative.   Respiratory: Negative.   Cardiovascular: Negative.   Gastrointestinal: Negative.   Endocrine: Negative.   Genitourinary: Negative.   Musculoskeletal: Negative.   Skin: Negative.   Allergic/Immunologic: Negative.   Neurological: Negative.   Hematological: Negative.   Psychiatric/Behavioral: Negative.      Objective: Vital Signs: There were no vitals taken for this visit.  Physical Exam  Constitutional: He is oriented to person, place, and time. He appears  well-developed and well-nourished.  HENT:  Head: Normocephalic and atraumatic.  Eyes: EOM are normal.  Neck: Neck supple.  Cardiovascular: Intact distal pulses.   Pulmonary/Chest: Effort normal.  Abdominal: Soft.  Neurological: He is alert and oriented to person, place, and time.  Skin: Skin is warm.  Psychiatric: He has a normal mood and affect. His behavior is normal. Judgment and thought content normal.  Nursing note and vitals reviewed.   Ortho Exam exam of both knees is benign. I do not appreciate any acute or worrisome features. Calves are nontender.  Specialty Comments:  No specialty comments available.  Imaging: Xr Knee 3 View Left  Result Date: 08/16/2016 Stable bilateral knee replacements  Xr Knee 3 View Right  Result Date: 08/16/2016 Bilateral stable knee replacements    PMFS History: Patient Active Problem List   Diagnosis Date Noted  . Acute pain of right shoulder 07/05/2016  . 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 Chronic pain of both knees 03/19/2016  . Embolism involving retinal artery   . Insomnia 02/16/2016  . Hollenhorst plaque, right eye 02/16/2016  . Broken tooth, complicated 01/23/2016  . Hypertension 01/07/2016  . Total knee replacement status 01/01/2016  . Primary osteoarthritis of right knee 05/26/2015   Past Medical History:  Diagnosis Date  . Arthritis   .  Bell's palsy 2010   lasted one day ago, told that he had a ministroke  . Headache   . Hollenhorst plaque, right eye   . Hypertension    followed by Dr. Nita Sells" with [facility]      No family history on file.  Past Surgical History:  Procedure Laterality Date  . CIRCUMCISION     done as a child- given inhalation   . JOINT REPLACEMENT Bilateral   . KNEE CLOSED REDUCTION Left 03/19/2016   Procedure: CLOSED MANIPULATION LEFT KNEE;  Surgeon: Tarry Kos, MD;  Location: [facility] OR;  Service: Orthopedics;  Laterality: Left;  . TEE WITHOUT  CARDIOVERSION N/A 02/19/2016   Procedure: TRANSESOPHAGEAL ECHOCARDIOGRAM (TEE);  Surgeon: Thurmon Fair, MD;  Location[facility] ENDOSCOPY;  Service: Cardiovascular;  Laterality: N/A;  . TOTAL KNEE ARTHROPLASTY Right 05/26/2015   Procedure: RIGHT TOTAL KNEE ARTHROPLASTY;  Surgeon: Tarry Kos, MD;  Location: [facility] OR;  Service: Orthopedics;  Laterality: Right;  . TOTAL KNEE ARTHROPLASTY Left 01/01/2016   Procedure: LEFT TOTAL KNEE ARTHROPLASTY;  Surgeon: Tarry Kos, MD;  Location: [facility] OR;  Service: Orthopedics;  Laterality: Left;   Social History   Occupational History  . Not on file.   Social History Main Topics  . Smoking status: Never Smoker  . Smokeless tobacco: Never Used  . Alcohol use No  . Drug use: No  . Sexual activity: Not on file

## 2016-10-04 ENCOUNTER — Telehealth: Payer: Self-pay | Admitting: Family Medicine

## 2016-10-04 NOTE — Telephone Encounter (Signed)
I reviewed the Walter Hodges database and discover he has been receiving 10mg  Ambien from different doctors.I gave him 5mg  on 05/17/16 with 3 refills and he has obtained 10mg  from other physicians in between this time frame. He will have to receive his Ambien from them.

## 2016-10-04 NOTE — Telephone Encounter (Signed)
Writer called patient back and presented him with information per MD.  Patient did not deny that he was getting ambien from other sources and knows he may not get [place].

## 2016-10-04 NOTE — Telephone Encounter (Signed)
Pt. Called requesting a refill on zolpidem (AMBIEN) 5 MG tablet  Please f/u

## 2016-10-28 DIAGNOSIS — R37 Sexual dysfunction, unspecified: Secondary | ICD-10-CM | POA: Insufficient documentation

## 2016-11-15 ENCOUNTER — Other Ambulatory Visit: Payer: Self-pay | Admitting: Family Medicine

## 2016-11-15 DIAGNOSIS — G4700 Insomnia, unspecified: Secondary | ICD-10-CM

## 2016-12-08 ENCOUNTER — Other Ambulatory Visit (INDEPENDENT_AMBULATORY_CARE_PROVIDER_SITE_OTHER): Payer: Self-pay | Admitting: *Deleted

## 2016-12-08 ENCOUNTER — Telehealth (INDEPENDENT_AMBULATORY_CARE_PROVIDER_SITE_OTHER): Payer: Self-pay | Admitting: *Deleted

## 2016-12-08 DIAGNOSIS — M25569 Pain in unspecified knee: Secondary | ICD-10-CM

## 2016-12-08 DIAGNOSIS — G8929 Other chronic pain: Secondary | ICD-10-CM

## 2016-12-08 DIAGNOSIS — M25562 Pain in left knee: Secondary | ICD-10-CM

## 2016-12-08 NOTE — Progress Notes (Unsigned)
This encounter was created in error - please disregard.

## 2016-12-08 NOTE — Telephone Encounter (Signed)
Pt called stating he is still in pain and was Denied referral to pain clinic and wants to send referral to [facility] pain [facility] in [place] and states he has spoken with someone there and says that when gets referral order from [place] then they will schedule him for this week no later than next week, Referral sent to [facility] in [place], [phone]

## 2017-01-07 ENCOUNTER — Emergency Department: Payer: Medicaid Other

## 2017-01-07 ENCOUNTER — Encounter: Payer: Self-pay | Admitting: Emergency Medicine

## 2017-01-07 ENCOUNTER — Emergency Department
Admission: EM | Admit: 2017-01-07 | Discharge: 2017-01-07 | Disposition: A | Discharge status: Home / Self Care | Payer: Medicaid Other | Attending: Emergency Medicine | Admitting: Emergency Medicine

## 2017-01-07 DIAGNOSIS — R05 Cough: Secondary | ICD-10-CM

## 2017-01-07 DIAGNOSIS — I10 Essential (primary) hypertension: Secondary | ICD-10-CM | POA: Insufficient documentation

## 2017-01-07 DIAGNOSIS — J029 Acute pharyngitis, unspecified: Secondary | ICD-10-CM

## 2017-01-07 DIAGNOSIS — R059 Cough, unspecified: Secondary | ICD-10-CM

## 2017-01-07 DIAGNOSIS — Z7982 Long term (current) use of aspirin: Secondary | ICD-10-CM | POA: Diagnosis not present

## 2017-01-07 MED ORDER — MAGIC MOUTHWASH W/LIDOCAINE: 10.0000 mL | Freq: Four times a day (QID) | ORAL | 0 refills | Status: DC | PRN

## 2017-01-07 MED ORDER — AZITHROMYCIN 250 MG PO TABS: ORAL_TABLET | ORAL | 0 refills | Status: DC

## 2017-01-07 MED ORDER — GUAIFENESIN-CODEINE 100-10 MG/5ML PO SYRP: 5.0000 mL | ORAL_SOLUTION | Freq: Three times a day (TID) | ORAL | 0 refills | Status: DC | PRN

## 2017-01-07 NOTE — ED Triage Notes (Signed)
Patient ambulatory to triage with steady gait, without difficulty or distress noted; pt reports x 3 days having nonprod cough, congestion, sore throat

## 2017-01-07 NOTE — ED Provider Notes (Signed)
[facility] Emergency Department Provider Note  ____________________________________________  Time seen: Approximately 7:08 AM  I have reviewed the triage vital signs and the nursing notes.   HISTORY  Chief Complaint Sore Throat and Cough    HPI Walter Hodges is a 61 y.o. male who presents to the emergency department for evaluation of nonproductive cough and sore throat for the past week. He feels that the cough is getting worse and the sore throat is not getting any better over time. He tried Halls Cough Drops without relief. He denies known fever.  Past Medical History:  Diagnosis Date  . Arthritis   . Bell's palsy 2010   lasted one day ago, told that he had a ministroke  . Headache   . Hollenhorst plaque, right eye   . Hypertension    followed by Dr. Nita Sells"maryann" with [facility]      Patient Active Problem List   Diagnosis Date Noted  . Acute pain of right shoulder 07/05/2016  . 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 Chronic pain of both knees 03/19/2016  . Embolism involving retinal artery   . Insomnia 02/16/2016  . Hollenhorst plaque, right eye 02/16/2016  . Broken tooth, complicated 01/23/2016  . Hypertension 01/07/2016  . Total knee replacement status 01/01/2016  . Primary osteoarthritis of right knee 05/26/2015    Past Surgical History:  Procedure Laterality Date  . CIRCUMCISION     done as a child- given inhalation   . JOINT REPLACEMENT Bilateral   . KNEE CLOSED REDUCTION Left 03/19/2016   Procedure: CLOSED MANIPULATION LEFT KNEE;  Surgeon: Tarry KosNaiping M Xu, MD;  Location: [facility] OR;  Service: Orthopedics;  Laterality: Left;  . TEE WITHOUT CARDIOVERSION N/A 02/19/2016   Procedure: TRANSESOPHAGEAL ECHOCARDIOGRAM (TEE);  Surgeon: Thurmon FairMihai Croitoru, MD;  [facility];  Service: Cardiovascular;  Laterality: N/A;  . TOTAL KNEE ARTHROPLASTY Right 05/26/2015   Procedure: RIGHT TOTAL KNEE  ARTHROPLASTY;  Surgeon: Tarry KosNaiping M Xu, MD;  Location: [facility] OR;  Service: Orthopedics;  Laterality: Right;  . TOTAL KNEE ARTHROPLASTY Left 01/01/2016   Procedure: LEFT TOTAL KNEE ARTHROPLASTY;  Surgeon: Tarry KosNaiping M Xu, MD;  Location: [facility] OR;  Service: Orthopedics;  Laterality: Left;    Prior to Admission medications   Medication Sig Start Date End Date Taking? Authorizing Provider  aspirin EC 325 MG tablet Take 1 tablet (325 mg total) by mouth 2 (two) times daily. 01/01/16   Naiping Donnelly StagerM Xu, MD  azithromycin (ZITHROMAX) 250 MG tablet 2 tablets today, then 1 tablet for the next 4 days. 01/07/17   Chinita Pesterari B Darrio Bade, FNP  ergocalciferol (DRISDOL) 50000 units capsule Take 1 capsule (50,000 Units total) by mouth once a week. 03/17/16   Jaclyn ShaggyEnobong Amao, MD  guaiFENesin-codeine (ROBITUSSIN AC) 100-10 MG/5ML syrup Take 5 mLs by mouth 3 (three) times daily as needed for cough. 01/07/17   Chinita Pesterari B Mckinzi Eriksen, FNP  hydrochlorothiazide (HYDRODIURIL) 25 MG tablet Take 1 tablet (25 mg total) by mouth daily. 05/17/16   Jaclyn ShaggyEnobong Amao, MD  lisinopril (PRINIVIL,ZESTRIL) 40 MG tablet Take 1 tablet (40 mg total) by mouth daily. 05/17/16   Jaclyn ShaggyEnobong Amao, MD  magic mouthwash w/lidocaine SOLN Take 10 mLs by mouth 4 (four) times daily as needed for mouth pain. 01/07/17   Chinita Pesterari B Nalini Alcaraz, FNP  polyethylene glycol (MIRALAX / GLYCOLAX) packet Take 17 g by mouth daily.    Historical Provider, MD  tadalafil (CIALIS) 10 MG tablet Take 1 tablet (10 mg total)  by mouth every other day as needed for erectile dysfunction. 05/17/16   Jaclyn Shaggy, MD  zolpidem (AMBIEN) 10 MG tablet Take 1 tablet (10 mg total) by mouth at bedtime as needed for sleep. 07/05/16 08/04/16  Tarry Kos, MD  zolpidem (AMBIEN) 5 MG tablet take 1 tablet by mouth at bedtime for sleep 11/16/16   Jaclyn Shaggy, MD    Allergies No known allergies  No family history on file.  Social History Social History  Substance Use Topics  . Smoking status: Never Smoker  . Smokeless tobacco: Never  Used  . Alcohol use No    Review of Systems Constitutional: negative for fever. Eyes: No visual changes. ENT: Positive for sore throat; negative for difficulty swallowing. Respiratory: Denies shortness of breath. Gastrointestinal: No abdominal pain.  No nausea, no vomiting.  No diarrhea.  Genitourinary: Negative for dysuria. Musculoskeletal: Negative for generalized body aches. Skin: Negative for rash. Neurological: Negative for headaches, focal weakness or numbness.  ____________________________________________   PHYSICAL EXAM:  VITAL SIGNS: ED Triage Vitals  Enc Vitals Group     BP 01/07/17 0631 (!) 154/83     Pulse Rate 01/07/17 0631 75     Resp 01/07/17 0631 18     Temp 01/07/17 0631 98.6 F (37 C)     Temp Source 01/07/17 0631 Oral     SpO2 01/07/17 0631 97 %     Weight 01/07/17 0630 250 lb (113.4 kg)     Height 01/07/17 0630 5\' 10"  (1.778 m)     Head Circumference --      Peak Flow --      Pain Score 01/07/17 0630 8     Pain Loc --      Pain Edu? --      Excl. in GC? --    Constitutional: Alert and oriented. Well appearing and in no acute distress. Eyes: Conjunctivae are normal. PERRL. EOMI. Head: Atraumatic. Nose: No congestion/rhinnorhea. Mouth/Throat: Mucous membranes are moist.  Oropharynx erythematous, without exudate. Neck: No stridor.  Lymphatic: Lymphadenopathy: Bilateral anterior cervical lymphadenopathy on palpation. Cardiovascular: Normal rate, regular rhythm. Good peripheral circulation. Respiratory: Normal respiratory effort. Lungs CTAB with diminished sounds over the left lower lung field. Gastrointestinal: Soft and nontender. Musculoskeletal: No lower extremity tenderness nor edema.   Neurologic:  Normal speech and language. No gross focal neurologic deficits are appreciated. Speech is normal. No gait instability. Skin:  Skin is warm, dry and intact. No rash noted Psychiatric: Mood and affect are normal. Speech and behavior are  normal.  ____________________________________________   LABS (all labs ordered are listed, but only abnormal results are displayed)  Labs Reviewed - No data to display ____________________________________________  EKG  Not indicated. ____________________________________________  RADIOLOGY  Chest x-ray is negative for acute abnormality per radiology. ____________________________________________   PROCEDURES  Procedure(s) performed: None  Critical Care performed: No ____________________________________________   INITIAL IMPRESSION / ASSESSMENT AND PLAN / ED COURSE  Pertinent labs & imaging results that were available during my care of the patient were reviewed by me and considered in my medical decision making (see chart for details).  61 year old male presenting with a 1 week history of cough and sore throat. Chest x-ray ordered due to decreased sounds in LLL.  ----------------------------------------- 7:59 AM on 01/07/2017 -----------------------------------------  No evidence of pneumonia or consolidation on chest x-ray. Prescriptions for Azithromycin, Robitussin AC, and Magic Mouthwash written. Patient is to follow up with his primary care provider for symptoms that are not improving over  the next few days or return to the ER for symptoms that change or worsen if he is unable to schedule an appointment. ____________________________________________  [facility] Prescriptions   AZITHROMYCIN (ZITHROMAX) 250 MG TABLET    2 tablets today, then 1 tablet for the next 4 days.   GUAIFENESIN-CODEINE (ROBITUSSIN AC) 100-10 MG/5ML SYRUP    Take 5 mLs by mouth 3 (three) times daily as needed for cough.   MAGIC MOUTHWASH W/LIDOCAINE SOLN    Take 10 mLs by mouth 4 (four) times daily as needed for mouth pain.    FINAL CLINICAL IMPRESSION(S) / ED DIAGNOSES  Final diagnoses:  Pharyngitis, unspecified etiology  Cough in adult patient    If controlled substance prescribed during this  visit, 12 month history viewed on the [facility] prior to issuing an initial prescription for Schedule II or III opiod.   Note:  This document was prepared using Dragon voice recognition software and may include unintentional dictation errors.    Chinita Pester, FNP 01/07/17 0801    Myrna Blazer, MD 01/07/17 [phone]

## 2017-01-07 NOTE — ED Notes (Signed)
Pt to ed with c/o sore throat, dry cough, nasal congestion x 1 week, denies fever, denies earache.  Pt appears in no resp distress.  Pt able to speak in full complete sentences, skin warm and dry.  Pt alert, denies use of OTC meds.

## 2017-04-07 ENCOUNTER — Ambulatory Visit (INDEPENDENT_AMBULATORY_CARE_PROVIDER_SITE_OTHER): Payer: Medicaid Other | Admitting: Neurology

## 2017-04-07 ENCOUNTER — Encounter: Payer: Self-pay | Admitting: Neurology

## 2017-04-07 VITALS — BP 104/69 | HR 77 | Ht 70.0 in | Wt 247.6 lb

## 2017-04-07 DIAGNOSIS — R0683 Snoring: Secondary | ICD-10-CM | POA: Diagnosis not present

## 2017-04-07 DIAGNOSIS — H539 Unspecified visual disturbance: Secondary | ICD-10-CM | POA: Diagnosis not present

## 2017-04-07 DIAGNOSIS — R51 Headache with orthostatic component, not elsewhere classified: Secondary | ICD-10-CM

## 2017-04-07 DIAGNOSIS — R519 Headache, unspecified: Secondary | ICD-10-CM

## 2017-04-07 NOTE — Patient Instructions (Signed)
Remember to drink plenty of fluid, eat healthy meals and do not skip any meals. Try to eat protein with a every meal and eat a healthy snack such as fruit or nuts in between meals. Try to keep a regular sleep-wake schedule and try to exercise daily, particularly in the form of walking, 20-30 minutes a day, if you can.   As far as diagnostic testing: MRI brain  I would like to see you back in 3 months, sooner if we need to. Please call us with any interim questions, concerns, problems, updates or refill requests.   Our phone number is [phone]. We also have an after hours call service for urgent matters and there is a physician on-call for urgent questions. For any emergencies you know to call 911 or go to the nearest emergency room

## 2017-04-07 NOTE — Progress Notes (Signed)
[facility]    Provider:  Dr Jaynee Eagles Referring Provider: Warden Fillers Primary Care Physician:  Dr. Kendall Flack, [facility]  CC:  headache  HPI:  Walter Hodges is a 61 y.o. male here as a referral from Dr. Jarold Song for headache. Past medical history of hypertensive retinopathy, hypertension, nuclear sclerosis, headache. Was recently seen by ophthalmologist and exam is stable and vision is good. Patient has had carotid ultrasound and echocardiogram in the past. Headaches have been ongoing for 2 weeks and fairly constant. Headaches started 1-2 years ago. They "come and go" and be very bad. He wakes up with them but can happen times of the day, vision gets blurry, excedrin migraine and goody powder help twice a week no more than 8x a month. The headaches are in the frontal area, light pain, dizzy and head spinning. They can last hours or all day if untreated. Headaches are 1-2x a week.  Light bothers him at night when raining but not significantly with the headaches, no sound sensitivity, doesn;t have to go into a dark room and no nausea.  Headaches started about 2 weeks but not since January otherwise, have not lasted longer than a month. No sinus issues. He take Ambien at night. No naps during the day.He does not sleep well, goes to bed at 11-12 may sleep all night, he goes to the bathroom a lot overnight. He sees primary care regularly, going next week. He has morning headaches. No other focal neurologic deficits, associated symptoms, inciting events or modifiable factors.    Reviewed notes, labs and imaging from outside physicians, which showed:   January 2018 MRI brain report reviewed report 9do not have images)  FINDINGS: #Skull/marrow/soft tissues: Unremarkable. #Orbits: Unremarkable. #Sinuses/mastoid air cells: Mild mucosal inflammatory changes present in ethmoid air cells and maxillary sinuses. The mastoid air cells are patent. #Vessels: Normal flow  voids within the major intracranial vessels. #Brain: No acute infarction is identified. There is a 6 mm focus of low T2-weighted signal intensity that is isointense on the T1-weighted sequence and is located in the anterior aspect of the RIGHT external capsule. This demonstrates low signal  intensity on the susceptibility images. This may represent chronic blood products versus mineralization. There is a tiny LEFT external capsule lacunar infarction. A few punctate foci of increased T2-weighted signal intensities are present in the  subcortical white matter regions. The posterior fossa is unremarkable. Ventricles are appropriate in size and configuration. There is no extra-axial fluid collection or intracranial hemorrhage. There is no mass effect or midline shift.  Reviewed notes from [facility], patient was seen for an emergency office visit for headaches. Complaining of aching pain in both eyes mild severity, episodic, intermittent, no associated symptoms. No inciting events. Best corrected vision was 2015 OD and 20/20 OS. Pupils equally round and reactive, no APD, extraocular movements intact, normal orbit and adnexa, slit lamp exam normal, optic disks and funduscopic exam normal.  Review of Systems: Patient complains of symptoms per HPI as well as the following symptoms: Insomnia, sleepiness, blurred vision . Pertinent negatives and positives per HPI. All others negative.   Social History   Social History  . Marital status: Widowed    Spouse name: N/A  . Number of children: 1  . Years of education: N/A   Occupational History  . Part-time     Couple days a week   Social History Main Topics  . Smoking status: Never Smoker  . Smokeless tobacco: Never Used  .  Alcohol use No     Comment: Socially in the past  . Drug use: No  . Sexual activity: Not on file   Other Topics Concern  . Not on file   Social History Narrative   Lives at home w/ his aunt   Right-handed   Caffeine:  occasional hot drinks in the winter, [place] or [place]. Dew    Family History  Problem Relation Age of Onset  . Headache Neg Hx     Past Medical History:  Diagnosis Date  . Arthritis   . Bell's palsy 2010   lasted one day ago, told that he had a ministroke  . Headache   . Hollenhorst plaque, right eye   . Hypertension    followed by Dr. Velta Addison" with [facility].      Past Surgical History:  Procedure Laterality Date  . CIRCUMCISION     done as a child- given inhalation   . JOINT REPLACEMENT Bilateral   . KNEE CLOSED REDUCTION Left 03/19/2016   Procedure: CLOSED MANIPULATION LEFT KNEE;  Surgeon: Leandrew Koyanagi, MD;  Location: [place];  Service: Orthopedics;  Laterality: Left;  . TEE WITHOUT CARDIOVERSION N/A 02/19/2016   Procedure: TRANSESOPHAGEAL ECHOCARDIOGRAM (TEE);  Surgeon: Sanda Klein, MD;  Location: [place];  Service: Cardiovascular;  Laterality: N/A;  . TOTAL KNEE ARTHROPLASTY Right 05/26/2015   Procedure: RIGHT TOTAL KNEE ARTHROPLASTY;  Surgeon: Leandrew Koyanagi, MD;  Location: [place];  Service: Orthopedics;  Laterality: Right;  . TOTAL KNEE ARTHROPLASTY Left 01/01/2016   Procedure: LEFT TOTAL KNEE ARTHROPLASTY;  Surgeon: Leandrew Koyanagi, MD;  Location: [place];  Service: Orthopedics;  Laterality: Left;    Current Outpatient Prescriptions  Medication Sig Dispense Refill  . amLODipine (NORVASC) 10 MG tablet     . magic mouthwash w/lidocaine SOLN Take 10 mLs by mouth 4 (four) times daily as needed for mouth pain. 100 mL 0  . zolpidem (AMBIEN) 5 MG tablet take 1 tablet by mouth at bedtime for sleep (Patient taking differently: 10 mg by mouth at bedtime for sleep) 30 tablet 3  . tadalafil (CIALIS) 10 MG tablet Take 1 tablet (10 mg total) by mouth every other day as needed for erectile dysfunction. 15 tablet 1  . zolpidem (AMBIEN) 10 MG tablet Take 1 tablet (10 mg total) by mouth at bedtime as needed for sleep. 30 tablet 0   No current facility-administered medications for this visit.       Allergies as of 04/07/2017 - Review Complete 04/07/2017  Allergen Reaction Noted  . No known allergies  03/18/2016    Vitals: BP 104/69   Pulse 77   Ht _0  (1.778 m)   Wt 247 lb 9.6 oz (112.3 kg)   BMI 35.53 kg/m  Last Weight:  Wt Readings from Last 1 Encounters:  04/07/17 247 lb 9.6 oz (112.3 kg)   Last Height:   Ht Readings from Last 1 Encounters:  04/07/17 _1  (1.778 m)    Physical exam: Exam: Gen: NAD, conversant, well nourised, obese, well groomed                     CV: RRR, no MRG. No Carotid Bruits. No peripheral edema, warm, nontender Eyes: Conjunctivae clear without exudates or hemorrhage  Neuro: Detailed Neurologic Exam  Speech:    Speech is normal; fluent and spontaneous with normal comprehension.  Cognition:    The patient is oriented to person, place, and time;     recent  and remote memory intact;     language fluent;     normal attention, concentration,     fund of knowledge Cranial Nerves:    The pupils are equal, round, and reactive to light. The fundi are normal and spontaneous venous pulsations are present. Visual fields are full to finger confrontation. Extraocular movements are intact. Trigeminal sensation is intact and the muscles of mastication are normal. The face is symmetric. The palate elevates in the midline. Hearing intact. Voice is normal. Shoulder shrug is normal. The tongue has normal motion without fasciculations.   Coordination:    Normal finger to nose and heel to shin. Normal rapid alternating movements.   Gait:    Heel-toe and tandem gait are normal.   Motor Observation:    No asymmetry, no atrophy, and no involuntary movements noted. Tone:    Normal muscle tone.    Posture:    Posture is normal. normal erect    Strength:    Strength is V/V in the upper and lower limbs.      Sensation: intact to LT     Reflex Exam:  DTR's:    Deep tendon reflexes in the upper and lower extremities are symmetrical  bilaterally.   Toes:    The toes are equivocal bilaterally.   Clonus:    Clonus is absent.      Assessment/Plan:  61 year old male here for new onset persistent headaches after the age of 89. Recommend MRI of the brain with and without contrast to evaluate for strokes, lesions or tumors or other intracerebral etiologies for headache and vision changes. ESR and CRP to evaluate for temporal arteritis. Previous MRI did show a lacunar infarct recommended patient take daily aspirin for stroke prevention and follow-up with primary care for close management of vascular risk factors.  Patient wakes regularly, has morning headaches, has gained weight and is obese will refer for sleep evaluation for sleep apnea.  Orders Placed This Encounter  Procedures  . MR BRAIN W WO CONTRAST  . Sedimentation rate  . C-reactive protein  . Basic Metabolic Panel  . Ambulatory referral to Sleep Studies    Cc: Dr. Katy Fitch, Dr. Merri Ray, [facility] [address] [place], [place] [address]  Phone [phone] Fax [phone]

## 2017-04-21 ENCOUNTER — Other Ambulatory Visit: Payer: Medicaid Other

## 2017-04-21 ENCOUNTER — Ambulatory Visit
Admission: RE | Admit: 2017-04-21 | Discharge: 2017-04-21 | Disposition: A | Discharge status: Home / Self Care | Payer: Medicaid Other | Source: Ambulatory Visit | Attending: Neurology | Admitting: Neurology

## 2017-04-21 DIAGNOSIS — H539 Unspecified visual disturbance: Secondary | ICD-10-CM

## 2017-04-21 DIAGNOSIS — R519 Headache, unspecified: Secondary | ICD-10-CM

## 2017-04-21 DIAGNOSIS — R51 Headache with orthostatic component, not elsewhere classified: Secondary | ICD-10-CM

## 2017-04-21 MED ORDER — GADOBENATE DIMEGLUMINE 529 MG/ML IV SOLN
20.0000 mL | Freq: Once | INTRAVENOUS | Status: AC | PRN
  Administered 2017-04-21: 20 mL via INTRAVENOUS

## 2017-04-25 ENCOUNTER — Telehealth: Payer: Self-pay | Admitting: Neurology

## 2017-04-25 NOTE — Telephone Encounter (Signed)
No acute findings. 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 No further concern or treatment for this. -VRP

## 2017-04-25 NOTE — Telephone Encounter (Signed)
Patient called office requesting MRI results.  Please call °

## 2017-04-26 NOTE — Telephone Encounter (Signed)
Spoke with patient and informed him Dr Marjory Lies reviewed his MRI brain. Advised there are no acute findings.   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 Advised him Dr Marjory Lies stated there is no further concern or treatment for this.  Patient stated he was told he had an abnormality in his brain when he was born. He confirmed his appointment with Dr Frances Furbish, verbalized understanding, appreciation for call.

## 2017-04-27 NOTE — Progress Notes (Signed)
See ph note from 04/25/17.  Huston Foley, MD, PhD [facility])

## 2017-05-16 ENCOUNTER — Encounter: Payer: Self-pay | Admitting: Neurology

## 2017-05-16 ENCOUNTER — Ambulatory Visit (INDEPENDENT_AMBULATORY_CARE_PROVIDER_SITE_OTHER): Payer: Medicaid Other | Admitting: Neurology

## 2017-05-16 VITALS — BP 130/79 | HR 61 | Ht 70.0 in | Wt 235.0 lb

## 2017-05-16 DIAGNOSIS — R51 Headache: Secondary | ICD-10-CM

## 2017-05-16 DIAGNOSIS — G479 Sleep disorder, unspecified: Secondary | ICD-10-CM

## 2017-05-16 DIAGNOSIS — R519 Headache, unspecified: Secondary | ICD-10-CM

## 2017-05-16 NOTE — Progress Notes (Signed)
Subjective:    Patient ID: Walter Hodges is a 61 y.o. male.  HPI     Huston Foley, MD, PhD [facility] [address], [address] [address] [place], [place] [address]  Dear Desma Maxim,   I saw your patient, Walter Hodges, upon your kind request in clinic today for initial consultation of his sleep disturbance, concern for underlying obstructive sleep apnea. The patient is unaccompanied today. As you know, Walter Hodges is a 61 year old right-handed gentleman with an underlying medical history of recurrent headaches, lacunar infarct, arthritis with status post bilateral knee replacement surgeries, Bell's palsy, hypertension, and obesity, who reports sleep onset and sleep maintenance difficulties as well as morning headaches. I reviewed your office note from 04/07/2017. His Epworth sleepiness score is 5 out of 24 today, fatigue score is 30 out of 63. He is widowed and lives with his aunt currently. He has one daughter who currently lives in [place] and is getting ready to move to [place]. He has 5 stepchildren. He does not smoke or drink alcohol or utilize caffeine on a day-to-day basis. He has made some changes in his lifestyle and started exercising, made some dietary changes and feels like his headaches have improved. He has lost weight. Blood pressure numbers are better as well. He does not report any snoring or gasping sensation and has not had any recent morning headaches. He has no family history of OSA. He has been on Ambien in the past but currently does not have a prescription for it. His neck circumference was 17-1/2 inches before and we rechecked today, it is 16-3/4 inches.  His Past Medical History Is Significant For: Past Medical History:  Diagnosis Date  . Arthritis   . Bell's palsy 2010   lasted one day ago, told that he had a ministroke  . Headache   . Hollenhorst plaque, right eye   . Hypertension    followed by Dr. Nita Sells" with [facility]      His Past  Surgical History Is Significant For: Past Surgical History:  Procedure Laterality Date  . CIRCUMCISION     done as a child- given inhalation   . JOINT REPLACEMENT Bilateral   . KNEE CLOSED REDUCTION Left 03/19/2016   Procedure: CLOSED MANIPULATION LEFT KNEE;  Surgeon: Tarry Kos, MD;  Location: [facility] OR;  Service: Orthopedics;  Laterality: Left;  . TEE WITHOUT CARDIOVERSION N/A 02/19/2016   Procedure: TRANSESOPHAGEAL ECHOCARDIOGRAM (TEE);  Surgeon: Thurmon Fair, MD;  Location: [facility] ENDOSCOPY;  Service: Cardiovascular;  Laterality: N/A;  . TOTAL KNEE ARTHROPLASTY Right 05/26/2015   Procedure: RIGHT TOTAL KNEE ARTHROPLASTY;  Surgeon: Tarry Kos, MD;  Location: [facility] OR;  Service: Orthopedics;  Laterality: Right;  . TOTAL KNEE ARTHROPLASTY Left 01/01/2016   Procedure: LEFT TOTAL KNEE ARTHROPLASTY;  Surgeon: Tarry Kos, MD;  Location: [facility] OR;  Service: Orthopedics;  Laterality: Left;    His Family History Is Significant For: Family History  Problem Relation Age of Onset  . Headache Neg Hx     His Social History Is Significant For: Social History   Social History  . Marital status: Widowed    Spouse name: N/A  . Number of children: 1  . Years of education: N/A   Occupational History  . Part-time     Couple days a week   Social History Main Topics  . Smoking status: Never Smoker  . Smokeless tobacco: Never Used  . Alcohol use No     Comment: Socially in the  past  . Drug use: No  . Sexual activity: Not Asked   Other Topics Concern  . None   Social History Narrative   Lives at home w/ his aunt   Right-handed   Caffeine: occasional hot drinks in the winter, [place] or [place]. Dew    His Allergies Are:  Allergies  Allergen Reactions  . No Known Allergies   :   His Current Medications Are:  Outpatient Encounter Prescriptions as of 05/16/2017  Medication Sig  . amLODipine (NORVASC) 10 MG tablet   . zolpidem (AMBIEN) 10 MG tablet Take 1 tablet (10 mg total) by mouth at bedtime as  needed for sleep.  . [DISCONTINUED] magic mouthwash w/lidocaine SOLN Take 10 mLs by mouth 4 (four) times daily as needed for mouth pain.  . [DISCONTINUED] tadalafil (CIALIS) 10 MG tablet Take 1 tablet (10 mg total) by mouth every other day as needed for erectile dysfunction.  . [DISCONTINUED] zolpidem (AMBIEN) 5 MG tablet take 1 tablet by mouth at bedtime for sleep (Patient taking differently: 10 mg by mouth at bedtime for sleep)   No facility-administered encounter medications on file as of 05/16/2017.   :  Review of Systems:  Out of a complete 14 point review of systems, all are reviewed and negative with the exception of these symptoms as listed below: Review of Systems  Neurological:       Pt presents today to discuss his sleep. Pt does not sleep well. Pt does not endorse snoring. Pt has never had a sleep study. Pt takes ambien  qhs prn.  Epworth Sleepiness Scale 0= would never doze 1= slight chance of dozing 2= moderate chance of dozing 3= high chance of dozing  Sitting and reading: 1 Watching TV: 1 Sitting inactive in a public place (ex. Theater or meeting): 1 As a passenger in a car for an hour without a break: 0 Lying down to rest in the afternoon: 2 Sitting and talking to someone: 0 Sitting quietly after lunch (no alcohol): 0 In a car, while stopped in traffic: 0 Total: 5     Objective:  Neurological Exam  Physical Exam Physical Examination:   Vitals:   05/16/17 1545  BP: 130/79  Pulse: 61   General Examination: The patient is a very pleasant 61 y.o. male in no acute distress. He appears well-developed and well-nourished and well groomed.   HEENT: Normocephalic, atraumatic, pupils are equal, round and reactive to light and accommodation. Funduscopic exam is normal with sharp disc margins noted. Extraocular tracking is good without limitation to gaze excursion or nystagmus noted. Normal smooth pursuit is noted. Hearing is grossly intact. Tympanic membranes are  clear bilaterally. Face is symmetric with normal facial animation and normal facial sensation. Speech is clear with no dysarthria noted. There is no hypophonia. There is no lip, neck/head, jaw or voice tremor. Neck is supple with full range of passive and active motion. There are no carotid bruits on auscultation. Oropharynx exam reveals: mild mouth dryness, adequate dental hygiene and mild airway crowding, due to wider uvula and tonsils in place which are about 1+ in size bilaterally. Mallampati is class II. Neck circumference is 16-3/4 inches. He has a moderate overbite.  Chest: Clear to auscultation without wheezing, rhonchi or crackles noted.  Heart: S1+S2+0, regular and normal without murmurs, rubs or gallops noted.   Abdomen: Soft, non-tender and non-distended with normal bowel sounds appreciated on auscultation.  Extremities: There is trace pitting edema in the distal lower extremities bilaterally, mostly  L ankle.   Skin: Warm and dry without trophic changes noted.  Musculoskeletal: exam reveals no obvious joint deformities, tenderness or joint swelling or erythema. Unremarkable knee replacement surgery scars bilaterally.   Neurologically:  Mental status: The patient is awake, alert and oriented in all 4 spheres. His immediate and remote memory, attention, language skills and fund of knowledge are appropriate. There is no evidence of aphasia, agnosia, apraxia or anomia. Speech is clear with normal prosody and enunciation. Thought process is linear. Mood is normal and affect is normal.  Cranial nerves II - XII are as described above under HEENT exam. In addition: shoulder shrug is normal with equal shoulder height noted. Motor exam: Normal bulk, strength and tone is noted. There is no drift, tremor or rebound. Romberg is negative. Reflexes are 1-2+ throughout. Fine motor skills and coordination: intact with normal finger taps, normal hand movements, normal rapid alternating patting, normal  foot taps and normal foot agility.  Cerebellar testing: No dysmetria or intention tremor on finger to nose testing. Heel to shin is unremarkable on the left and slightly difficult with decreased range of motion on the right. There is no truncal or gait ataxia.  Sensory exam: intact to light touch in the upper and lower extremities.  Gait, station and balance: He stands easily. No veering to one side is noted. No leaning to one side is noted. Posture is age-appropriate and stance is narrow based. Gait shows normal stride length and normal pace. No problems turning are noted. Tandem walk is unremarkable.            Assessment and Plan:  In summary, Arthur HolmsMichael A Sheeler is a very pleasant 61 y.o.-year old male with an underlying medical history of recurrent headaches, lacunar infarct, arthritis with status post bilateral knee replacement surgeries, Bell's palsy, hypertension, and obesity, who presents for sleep consultation because of recent issues with recurrent headaches and particularly with morning headaches. He has a long-standing history of sleep difficulty with sleep onset and sleep maintenance and has been taking Ambien in the past. He denies any snoring and has had some improvement in his headaches since he has made some changes to his lifestyle, started drinking more water and started working out. He has lost weight and blood pressure numbers are better, and he has indeed lost about 11 pounds since he saw you in August. He feels like he would like to wait things out a little bit longer as far sleep study testing goes and I agree. We can always bring him in for sleep study should his headaches bother him or should he develop issues with snoring or witnessed apneas per family or friends. As of right now we can play it by ear. He has an appointment with Darrol Angelarolyn Martin in November which I asked him to keep as per your plan. I answered all his questions today and he was in agreement.   Thank you very much for  allowing me to participate in the care of this nice patient. If I can be of any further assistance to you please do not hesitate to talk to me.   Sincerely,   Huston FoleySaima Cherrish Vitali, MD, PhD

## 2017-05-16 NOTE — Patient Instructions (Addendum)
Thank you for choosing [facility] for your sleep related care!  It was nice to meet you today! I appreciate that you entrust me with your sleep related healthcare concerns. I hope, I was able to address at least some of your concerns today, and that I can help you feel reassured and also get better.    Here is what we discussed today and what we came up with as our plan for you:    Based on your symptoms and your exam I believe you may be at some risk for obstructive sleep apnea (OSA), and I think we can consider a sleep study in the future. However, since you have made some changes to your lifestyle including exercising more, dietary changes and you have lost weight, and your BP is better, we mutually agreed to wait it out some and see if you continue to do well. Should your headaches worsen and you wake up with headaches or if you are told by family or friends that use snore or have pauses in your breathing while asleep, we will proceed with a sleep study.   As of right now, we can play it by ear. Please keep your appointment with with our nurse practitioner, Darrol Angelarolyn Martin, for November as per Dr. Trevor MaceAhern's plan.

## 2017-06-01 IMAGING — CR DG CHEST 2V
1 series · 2 of 2 positions shown · non-contrast
Comparison: Chest radiographs 01/14/2015.

CLINICAL DATA: 61-year-old male with chest pain, nonproductive
cough, chills. Left lower lung abnormal pulmonary auscultation.

EXAM:
CHEST  2 VIEW

[Series 1: dg chest 2 view · 0.14mm/px · 2 of 2 slices shown]
[im 1/2]
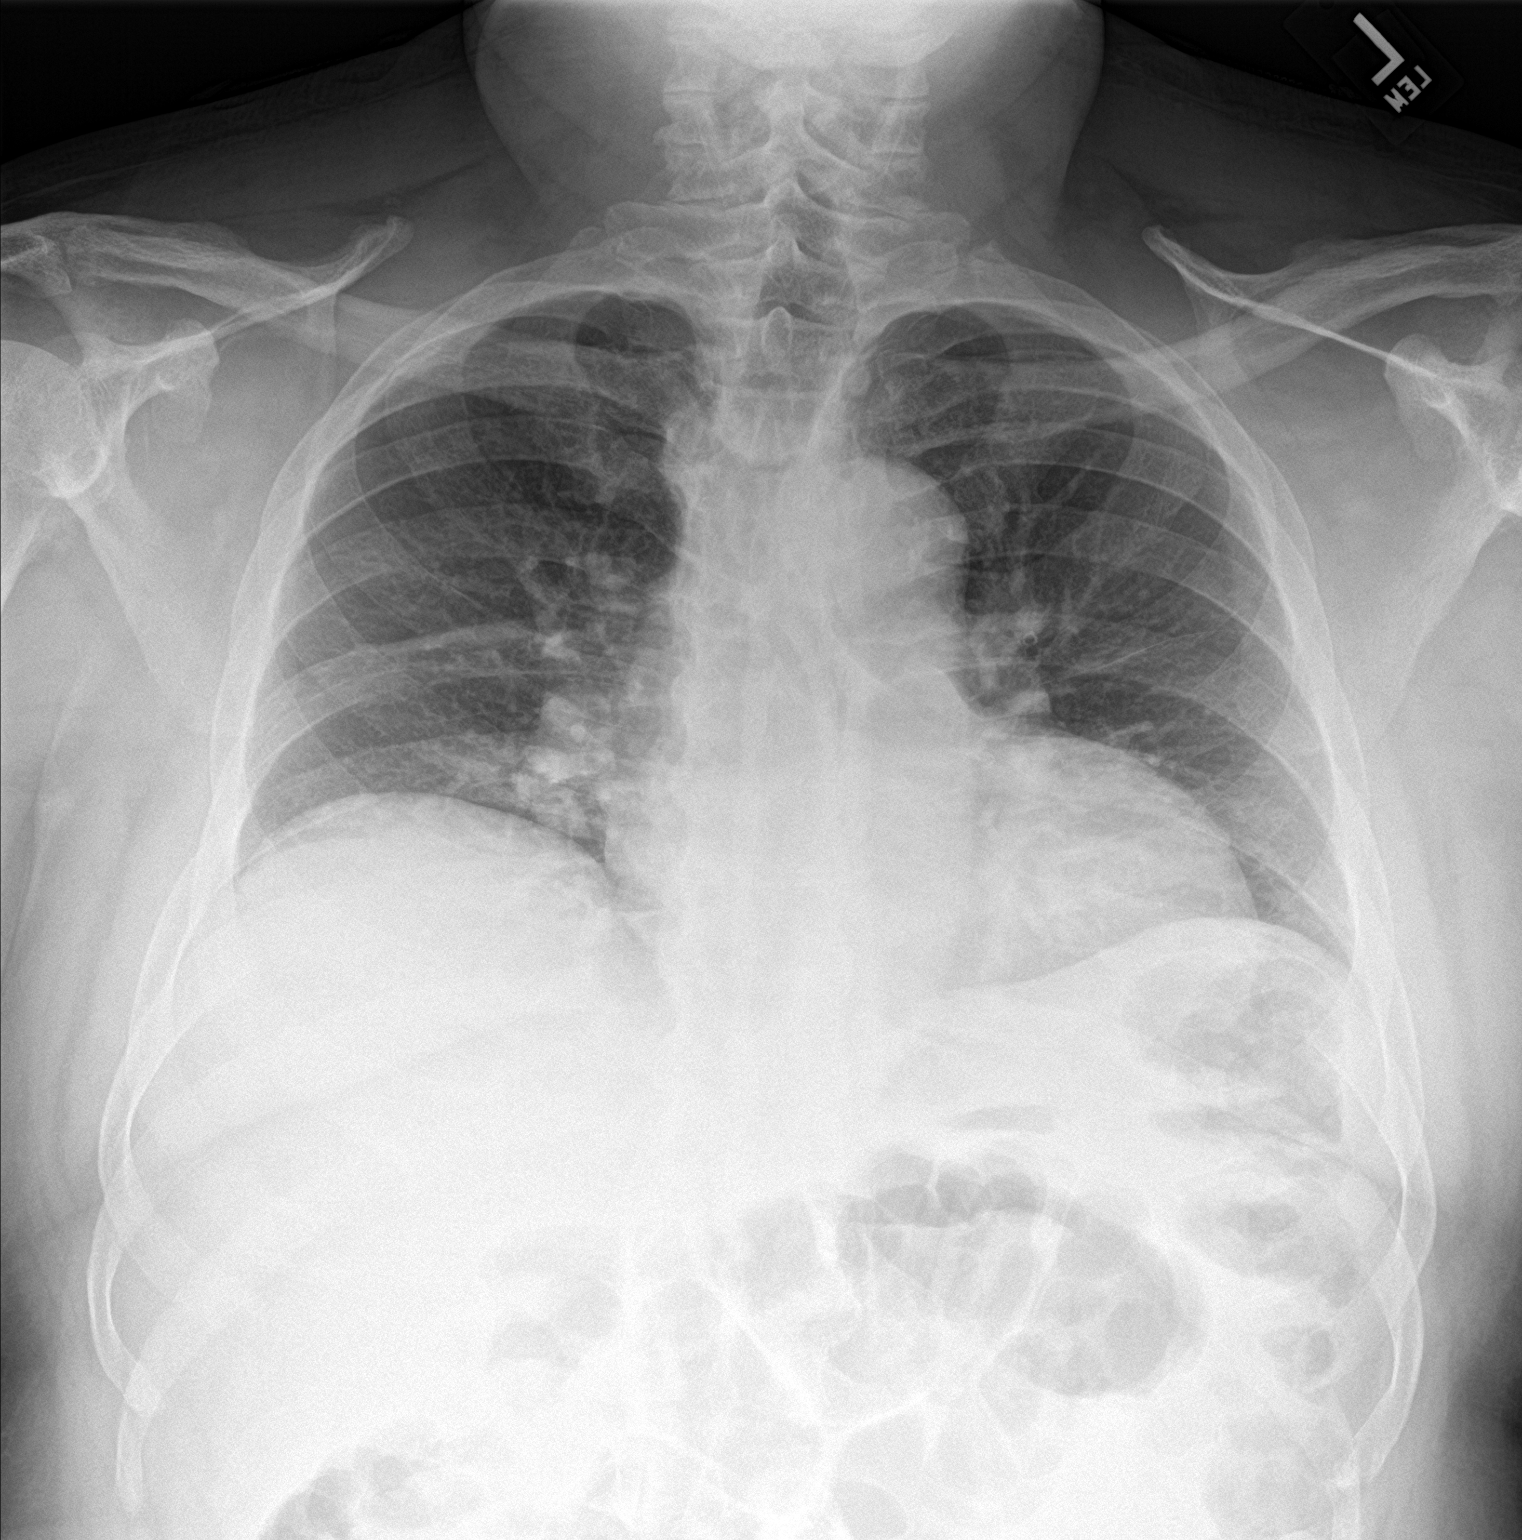
[im 2/2]
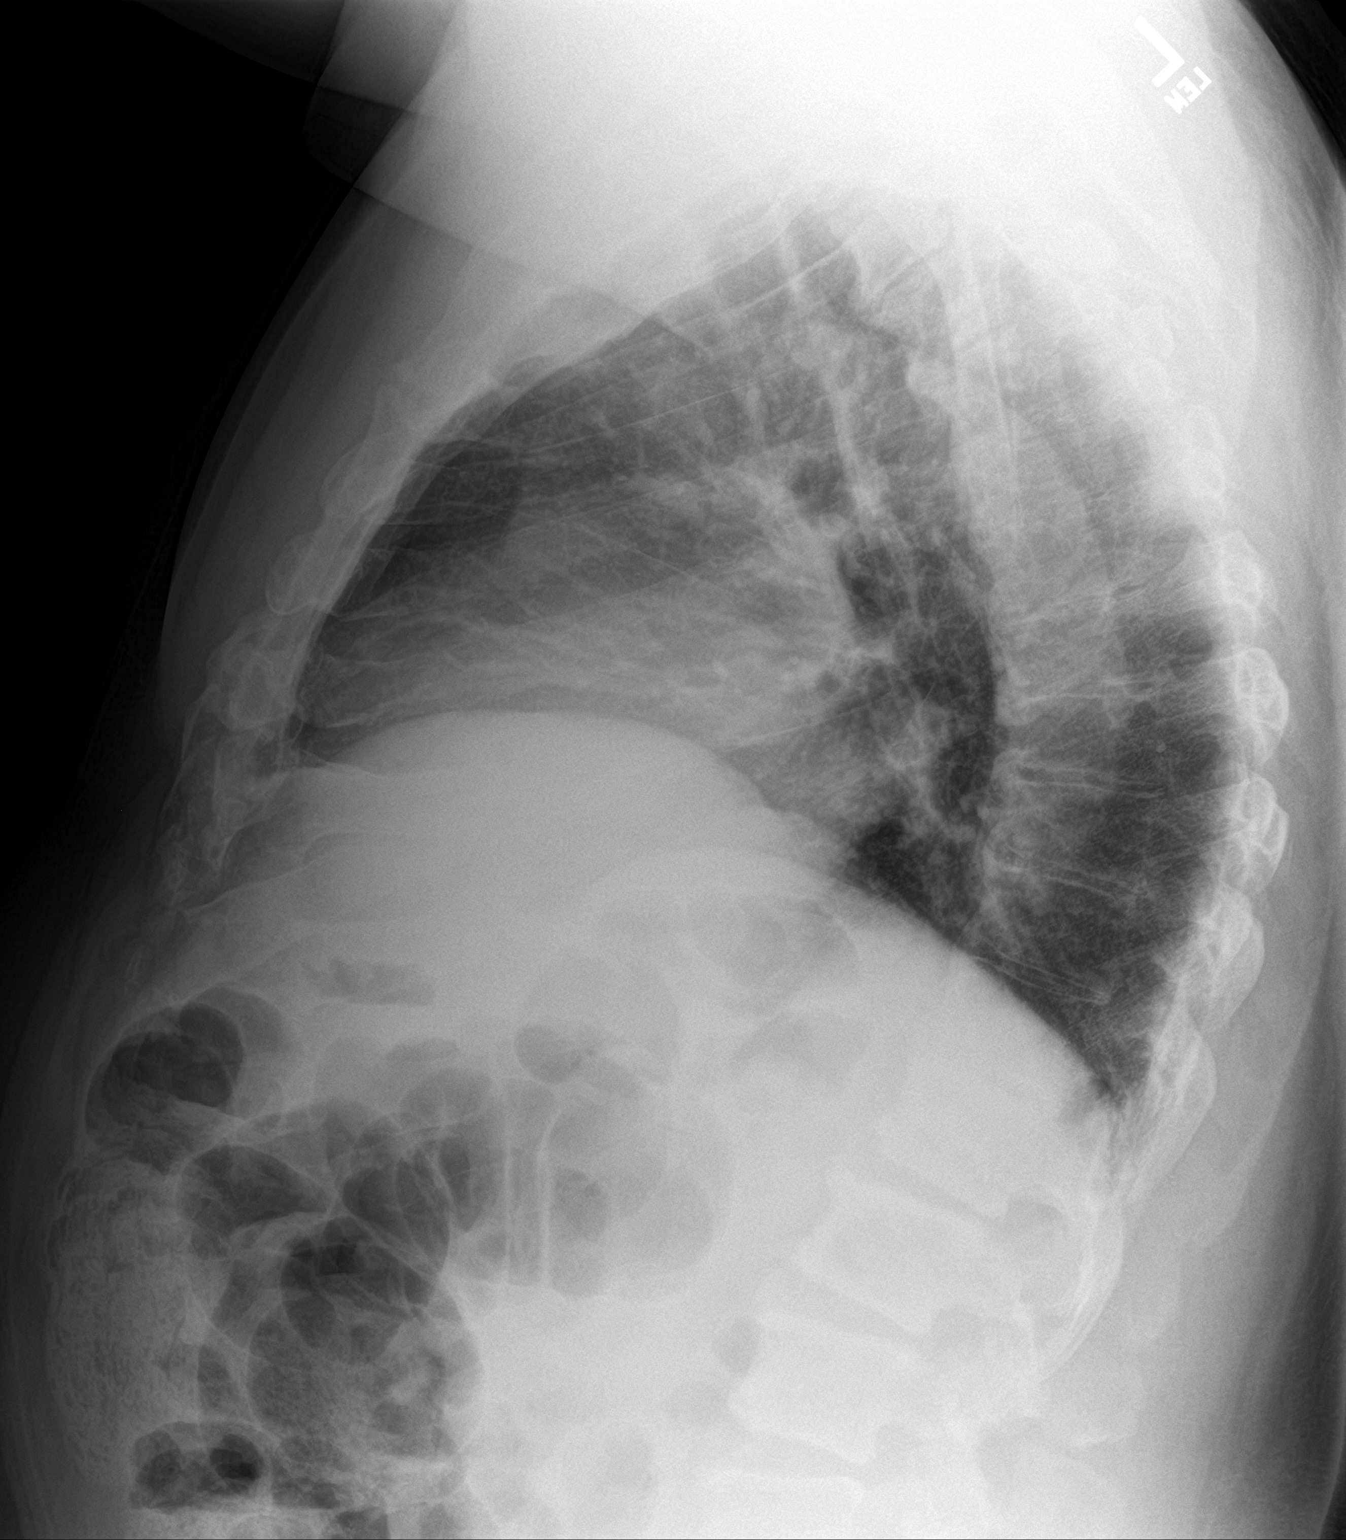

[2 of 2 positions shown; findings below may reference images not displayed]

FINDINGS: Chronically low lung volumes. Mediastinal contours remain within
normal limits. Visualized tracheal air column is within normal
limits. No pneumothorax, pulmonary edema or pleural effusion. No
consolidation or confluent pulmonary opacity. No acute osseous
abnormality identified. Negative visible bowel gas pattern.
IMPRESSION: Chronically low lung volumes.  No acute cardiopulmonary abnormality.

## 2017-06-10 ENCOUNTER — Encounter: Payer: Self-pay | Admitting: Emergency Medicine

## 2017-06-10 DIAGNOSIS — K1370 Unspecified lesions of oral mucosa: Secondary | ICD-10-CM | POA: Diagnosis not present

## 2017-06-10 DIAGNOSIS — I10 Essential (primary) hypertension: Secondary | ICD-10-CM | POA: Insufficient documentation

## 2017-06-10 DIAGNOSIS — Z79899 Other long term (current) drug therapy: Secondary | ICD-10-CM | POA: Diagnosis not present

## 2017-06-10 DIAGNOSIS — K1379 Other lesions of oral mucosa: Secondary | ICD-10-CM | POA: Diagnosis present

## 2017-06-10 NOTE — ED Triage Notes (Signed)
Pt presents with recurring ulcers in his mouth; current sores present for about a month; pt in no acute distress

## 2017-06-10 NOTE — ED Notes (Signed)
Pt presents with c/o sores in his mouth; ambulatory with steady gait; talking in complete coherent sentences

## 2017-06-11 ENCOUNTER — Emergency Department
Admission: EM | Admit: 2017-06-11 | Discharge: 2017-06-11 | Disposition: A | Discharge status: Home / Self Care | Payer: Medicaid Other | Attending: Emergency Medicine | Admitting: Emergency Medicine

## 2017-06-11 DIAGNOSIS — K121 Other forms of stomatitis: Secondary | ICD-10-CM

## 2017-06-11 MED ORDER — MAGIC MOUTHWASH
10.0000 mL | Freq: Once | ORAL | Status: AC
  Administered 2017-06-11: 10 mL via ORAL
  Filled 2017-06-11: qty 10

## 2017-06-11 MED ORDER — MAGIC MOUTHWASH W/LIDOCAINE: 5.0000 mL | Freq: Four times a day (QID) | ORAL | 0 refills | Status: DC | PRN

## 2017-06-11 NOTE — ED Provider Notes (Signed)
[facility] Emergency Department Provider Note  ____________________________________________   First MD Initiated Contact with Patient 06/11/17 0234     (approximate)  I have reviewed the triage vital signs and the nursing notes.   HISTORY  Chief Complaint Mouth Lesions    HPI Walter Hodges is a 61 y.o. male with medical history as listed below with a history of recurrent mouth ulcers who presents tonight for evaluation of persistent mouth ulcers and associated pain for about one week.  He states that in the past this happen multiple times and that magic mouthwash helps.  He does not have any currently and that is what he is requesting.  He states that he has not been tested recently for HIV but he has tested negative in the past and he is not concerned about it.  He has had no other symptoms specifically denying fever/chills, chest pain, shortness of breath, nasal congestion, runny nose, difficulty swallowing, nausea, vomiting, and abdominal pain.  Reports the pain in his mouth from the sores is severe and nothing makes it better.   Past Medical History:  Diagnosis Date  . Arthritis   . Bell's palsy 2010   lasted one day ago, told that he had a ministroke  . Headache   . Hollenhorst plaque, right eye   . Hypertension    followed by Dr. Nita Sells" with [facility]      Patient Active Problem List   Diagnosis Date Noted  . Acute pain of right shoulder 07/05/2016  . 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 Chronic pain of both knees 03/19/2016  . Embolism involving retinal artery   . Insomnia 02/16/2016  . Hollenhorst plaque, right eye 02/16/2016  . Broken tooth, complicated 01/23/2016  . Hypertension 01/07/2016  . Total knee replacement status 01/01/2016  . Primary osteoarthritis of right knee 05/26/2015    Past Surgical History:  Procedure Laterality Date  . CIRCUMCISION     done as a child- given  inhalation   . JOINT REPLACEMENT Bilateral   . KNEE CLOSED REDUCTION Left 03/19/2016   Procedure: CLOSED MANIPULATION LEFT KNEE;  Surgeon: Tarry Kos, MD;  Location: [facility] OR;  Service: Orthopedics;  Laterality: Left;  . TEE WITHOUT CARDIOVERSION N/A 02/19/2016   Procedure: TRANSESOPHAGEAL ECHOCARDIOGRAM (TEE);  Surgeon: Thurmon Fair, MD;  Location: [facility] ENDOSCOPY;  Service: Cardiovascular;  Laterality: N/A;  . TOTAL KNEE ARTHROPLASTY Right 05/26/2015   Procedure: RIGHT TOTAL KNEE ARTHROPLASTY;  Surgeon: Tarry Kos, MD;  Location: [facility] OR;  Service: Orthopedics;  Laterality: Right;  . TOTAL KNEE ARTHROPLASTY Left 01/01/2016   Procedure: LEFT TOTAL KNEE ARTHROPLASTY;  Surgeon: Tarry Kos, MD;  Location: [facility] OR;  Service: Orthopedics;  Laterality: Left;    Prior to Admission medications   Medication Sig Start Date End Date Taking? Authorizing Provider  amLODipine (NORVASC) 10 MG tablet  12/07/16  Yes [provider]  magic mouthwash w/lidocaine SOLN Take 5 mLs by mouth 4 (four) times daily as needed for mouth pain. Swish and spit, do not swallow the solution. 06/11/17   Loleta Rose, MD    Allergies No known allergies  Family History  Problem Relation Age of Onset  . Headache Neg Hx     Social History Social History  Substance Use Topics  . Smoking status: Never Smoker  . Smokeless tobacco: Never Used  . Alcohol use No     Comment: Socially in the past  Review of Systems Constitutional: No fever/chills Eyes: No visual changes. ENT: No sore throat But severe pain in his mouth.  No swelling, no difficulty swallowing Cardiovascular: Denies chest pain. Respiratory: Denies shortness of breath. Gastrointestinal: No abdominal pain.  No nausea, no vomiting.  No diarrhea.  No constipation. Integumentary: Negative for rash. Neurological: Negative for headaches, focal weakness or numbness.   ____________________________________________   PHYSICAL EXAM:  VITAL SIGNS: ED  Triage Vitals  Enc Vitals Group     BP 06/10/17 2318 130/73     Pulse Rate 06/10/17 2318 77     Resp 06/10/17 2318 18     Temp 06/10/17 2318 98.2 F (36.8 C)     Temp Source 06/10/17 2318 Oral     SpO2 06/10/17 2318 97 %     Weight 06/10/17 2316 106.1 kg (234 lb)     Height 06/10/17 2316 1.765 m (5' 9.5")     Head Circumference --      Peak Flow --      Pain Score 06/10/17 2316 8     Pain Loc --      Pain Edu? --      Excl. in GC? --     Constitutional: Alert and oriented. Well appearing and in no acute distress. Eyes: Conjunctivae are normal.  Head: Atraumatic. Nose: No congestion/rhinnorhea. Mouth/Throat: Mucous membranes are moist.  Oropharynx non-erythematous.  The patient has what appears to be several aphthous ulcers most notable on the right upper palate near some dental appliances.  There is no swelling, no evidence of abscess, no evidence of Ludwig angina Respiratory: Normal respiratory effort.  No retractions. Lungs CTAB. Musculoskeletal: No lower extremity tenderness nor edema. No gross deformities of extremities. Neurologic:  Normal speech and language. No gross focal neurologic deficits are appreciated.  Skin:  Skin is warm, dry and intact. No rash noted. Psychiatric: Mood and affect are normal. Speech and behavior are normal.  ____________________________________________   LABS (all labs ordered are listed, but only abnormal results are displayed)  Labs Reviewed - No data to display ____________________________________________  EKG  None - EKG not ordered by ED physician ____________________________________________  RADIOLOGY   No results found.  ____________________________________________   PROCEDURES  Critical Care performed: No   Procedure(s) performed:   Procedures   ____________________________________________   INITIAL IMPRESSION / ASSESSMENT AND PLAN / ED COURSE  As part of my medical decision making, I reviewed the following  data within the electronic MEDICAL RECORD NUMBER Notes from prior ED visits    The patient is hemodynamically stable and afebrile.  He has had similar ulcers in his mouth multiple times in the past.  He reports he has had HIV testing in the past as negative and is not concerned currently.  He does not want any additional testing and is just asking for magic mouthwash with lidocaine.  He states he can follow-up with his primary care doctor.  I have ordered a dose of the magic mouthwash here and a prescription after discussing the appropriate mixture with pharmacy.  I gave my usual and customary return precautions.        ____________________________________________  FINAL CLINICAL IMPRESSION(S) / ED DIAGNOSES  Final diagnoses:  Mouth ulcers     MEDICATIONS GIVEN DURING THIS VISIT:  Medications  magic mouthwash (10 mLs Oral Given 06/11/17 0255)     NEW OUTPATIENT MEDICATIONS STARTED DURING THIS VISIT:  Discharge Medication List as of 06/11/2017  4:18 AM    START taking these medications  Details  magic mouthwash w/lidocaine SOLN Take 5 mLs by mouth 4 (four) times daily as needed for mouth pain. Swish and spit, do not swallow the solution., Starting Sat 06/11/2017, Print        Discharge Medication List as of 06/11/2017  4:18 AM      Discharge Medication List as of 06/11/2017  4:18 AM       Note:  This document was prepared using Dragon voice recognition software and may include unintentional dictation errors.    Loleta Rose, MD 06/11/17 [phone]

## 2017-06-11 NOTE — Discharge Instructions (Signed)
Please follow up with your regular doctor at the next available opportunity to discuss your recurrent mouth ulcers and see if he or she has any additional recommendations.

## 2017-06-24 ENCOUNTER — Other Ambulatory Visit: Payer: Self-pay | Admitting: Cardiology

## 2017-06-24 DIAGNOSIS — R079 Chest pain, unspecified: Secondary | ICD-10-CM

## 2017-07-08 ENCOUNTER — Ambulatory Visit (HOSPITAL_COMMUNITY)
Admission: RE | Admit: 2017-07-08 | Discharge: 2017-07-08 | Disposition: A | Discharge status: Home / Self Care | Payer: Medicaid Other | Source: Ambulatory Visit | Attending: Cardiology | Admitting: Cardiology

## 2017-07-08 DIAGNOSIS — R079 Chest pain, unspecified: Secondary | ICD-10-CM | POA: Diagnosis present

## 2017-07-08 DIAGNOSIS — G9389 Other specified disorders of brain: Secondary | ICD-10-CM | POA: Insufficient documentation

## 2017-07-08 MED ORDER — TECHNETIUM TC 99M TETROFOSMIN IV KIT
30.0000 | PACK | Freq: Once | INTRAVENOUS | Status: AC | PRN
  Administered 2017-07-08: 30 via INTRAVENOUS

## 2017-07-08 MED ORDER — REGADENOSON 0.4 MG/5ML IV SOLN
INTRAVENOUS | Status: AC
  Administered 2017-07-08: 0.4 mg via INTRAVENOUS
  Filled 2017-07-08: qty 5

## 2017-07-08 MED ORDER — TECHNETIUM TC 99M TETROFOSMIN IV KIT
10.0000 | PACK | Freq: Once | INTRAVENOUS | Status: AC | PRN
  Administered 2017-07-08: 10 via INTRAVENOUS

## 2017-07-08 MED ORDER — REGADENOSON 0.4 MG/5ML IV SOLN
0.4000 mg | Freq: Once | INTRAVENOUS | Status: AC
  Administered 2017-07-08: 0.4 mg via INTRAVENOUS

## 2017-07-14 ENCOUNTER — Ambulatory Visit: Payer: Medicaid Other | Admitting: Nurse Practitioner

## 2017-08-15 ENCOUNTER — Ambulatory Visit (INDEPENDENT_AMBULATORY_CARE_PROVIDER_SITE_OTHER): Payer: Medicaid Other | Admitting: Orthopaedic Surgery

## 2017-08-18 DIAGNOSIS — E669 Obesity, unspecified: Secondary | ICD-10-CM | POA: Insufficient documentation

## 2017-10-06 NOTE — Progress Notes (Deleted)
[facility]  PATIENT: Walter Hodges DOB: May 11, 1956   REASON FOR VISIT: *** HISTORY FROM:    HISTORY OF PRESENT ILLNESS: 04/07/17 Walter Hodges is a 62 y.o. male here as a referral from Dr. Jarold Song for headache. Past medical history of hypertensive retinopathy, hypertension, nuclear sclerosis, headache. Was recently seen by ophthalmologist and exam is stable and vision is good. Patient has had carotid ultrasound and echocardiogram in the past. Headaches have been ongoing for 2 weeks and fairly constant. Headaches started 1-2 years ago. They "come and go" and be very bad. He wakes up with them but can happen times of the day, vision gets blurry, excedrin migraine and goody powder help twice a week no more than 8x a month. The headaches are in the frontal area, light pain, dizzy and head spinning. They can last hours or all day if untreated. Headaches are 1-2x a week.  Light bothers him at night when raining but not significantly with the headaches, no sound sensitivity, doesn;t have to go into a dark room and no nausea.  Headaches started about 2 weeks but not since January otherwise, have not lasted longer than a month. No sinus issues. He take Ambien at night. No naps during the day.He does not sleep well, goes to bed at 11-12 may sleep all night, he goes to the bathroom a lot overnight. He sees primary care regularly, going next week. He has morning headaches. No other focal neurologic deficits, associated symptoms, inciting events or modifiable factors.   REVIEW OF SYSTEMS: Full 14 system review of systems performed and notable only for those listed, all others are neg:  Constitutional: neg  Cardiovascular: neg Ear/Nose/Throat: neg  Skin: neg Eyes: neg Respiratory: neg Gastroitestinal: neg  Hematology/Lymphatic: neg  Endocrine: neg Musculoskeletal:neg Allergy/Immunology: neg Neurological: neg Psychiatric: neg Sleep : neg   ALLERGIES: Allergies  Allergen  Reactions  . No Known Allergies     HOME MEDICATIONS: Outpatient Medications Prior to Visit  Medication Sig Dispense Refill  . amLODipine (NORVASC) 10 MG tablet     . magic mouthwash w/lidocaine SOLN Take 5 mLs by mouth 4 (four) times daily as needed for mouth pain. Swish and spit, do not swallow the solution. 360 mL 0   No facility-administered medications prior to visit.     PAST MEDICAL HISTORY: Past Medical History:  Diagnosis Date  . Arthritis   . Bell's palsy 2010   lasted one day ago, told that he had a ministroke  . Headache   . Hollenhorst plaque, right eye   . Hypertension    followed by Dr. Velta Addison" with [facility]      PAST SURGICAL HISTORY: Past Surgical History:  Procedure Laterality Date  . CIRCUMCISION     done as a child- given inhalation   . JOINT REPLACEMENT Bilateral   . KNEE CLOSED REDUCTION Left 03/19/2016   Procedure: CLOSED MANIPULATION LEFT KNEE;  Surgeon: Leandrew Koyanagi, MD;  Location: [place];  Service: Orthopedics;  Laterality: Left;  . TEE WITHOUT CARDIOVERSION N/A 02/19/2016   Procedure: TRANSESOPHAGEAL ECHOCARDIOGRAM (TEE);  Surgeon: Sanda Klein, MD;  Location: [place];  Service: Cardiovascular;  Laterality: N/A;  . TOTAL KNEE ARTHROPLASTY Right 05/26/2015   Procedure: RIGHT TOTAL KNEE ARTHROPLASTY;  Surgeon: Leandrew Koyanagi, MD;  Location: [place];  Service: Orthopedics;  Laterality: Right;  . TOTAL KNEE ARTHROPLASTY Left 01/01/2016   Procedure: LEFT TOTAL KNEE ARTHROPLASTY;  Surgeon: Leandrew Koyanagi, MD;  Location: [place];  Service:  Orthopedics;  Laterality: Left;    FAMILY HISTORY: Family History  Problem Relation Age of Onset  . Headache Neg Hx     SOCIAL HISTORY: Social History   Socioeconomic History  . Marital status: Widowed    Spouse name: Not on file  . Number of children: 1  . Years of education: Not on file  . Highest education level: Not on file  Social Needs  . Financial resource strain: Not on file  . Food insecurity -  worry: Not on file  . Food insecurity - inability: Not on file  . Transportation needs - medical: Not on file  . Transportation needs - non-medical: Not on file  Occupational History  . Occupation: Part-time    Comment: Couple days a week  Tobacco Use  . Smoking status: Never Smoker  . Smokeless tobacco: Never Used  Substance and Sexual Activity  . Alcohol use: No    Comment: Socially in the past  . Drug use: No  . Sexual activity: Not on file  Other Topics Concern  . Not on file  Social History Narrative   Lives at home w/ his aunt   Right-handed   Caffeine: occasional hot drinks in the winter, [place] or [place]. Dew     PHYSICAL EXAM  There were no vitals filed for this visit. There is no height or weight on file to calculate BMI.  Generalized: Well developed, in no acute distress  Head: normocephalic and atraumatic,. Oropharynx benign  Neck: Supple, no carotid bruits  Cardiac: Regular rate rhythm, no murmur  Musculoskeletal: No deformity   Neurological examination   Mentation: Alert oriented to time, place, history taking. Attention span and concentration appropriate. Recent and remote memory intact.  Follows all commands speech and language fluent.   Cranial nerve II-XII: Fundoscopic exam reveals sharp disc margins.Pupils were equal round reactive to light extraocular movements were full, visual field were full on confrontational test. Facial sensation and strength were normal. hearing was intact to finger rubbing bilaterally. Uvula tongue midline. head turning and shoulder shrug were normal and symmetric.Tongue protrusion into cheek strength was normal. Motor: normal bulk and tone, full strength in the BUE, BLE, fine finger movements normal, no pronator drift. No focal weakness Sensory: normal and symmetric to light touch, pinprick, and  Vibration, proprioception  Coordination: finger-nose-finger, heel-to-shin bilaterally, no dysmetria Reflexes: Brachioradialis 2/2, biceps  2/2, triceps 2/2, patellar 2/2, Achilles 2/2, plantar responses were flexor bilaterally. Gait and Station: Rising up from seated position without assistance, normal stance,  moderate stride, good arm swing, smooth turning, able to perform tiptoe, and heel walking without difficulty. Tandem gait is steady  DIAGNOSTIC DATA (LABS, IMAGING, TESTING) - I reviewed patient records, labs, notes, testing and imaging myself where available.  Lab Results  Component Value Date   WBC 6.1 03/15/2016   HGB 11.3 (L) 03/15/2016   HCT 34.4 (L) 03/15/2016   MCV 80.4 03/15/2016   PLT 370 03/15/2016      Component Value Date/Time   NA 141 05/17/2016 1606   K 3.7 05/17/2016 1606   CL 103 05/17/2016 1606   CO2 30 05/17/2016 1606   GLUCOSE 102 (H) 05/17/2016 1606   BUN 13 05/17/2016 1606   CREATININE 0.66 (L) 05/17/2016 1606   CALCIUM 9.2 05/17/2016 1606   PROT 6.7 12/29/2015 0935   ALBUMIN 4.0 03/15/2016 1433   AST 20 12/29/2015 0935   ALT 16 (L) 12/29/2015 0935   ALKPHOS 78 12/29/2015 0935   BILITOT 0.5 12/29/2015  0935   GFRNONAA >60 03/17/2016 0759   GFRAA >60 03/17/2016 0759   Lab Results  Component Value Date   CHOL 138 03/22/2016   HDL 52 03/22/2016   LDLCALC 71 03/22/2016   TRIG 77 03/22/2016   CHOLHDL 2.7 03/22/2016   Lab Results  Component Value Date   HGBA1C 5.8 01/07/2016   No results found for: VITAMINB12 Lab Results  Component Value Date   TSH 1.24 03/15/2016      ASSESSMENT AND PLAN  62 y.o. year old male  has a past medical history of Arthritis, Bell's palsy (2010), Headache, Hollenhorst plaque, right eye, and Hypertension. here with *** 62 year old male here for new onset persistent headaches after the age of 89. Recommend MRI of the brain with and without contrast to evaluate for strokes, lesions or tumors or other intracerebral etiologies for headache and vision changes. ESR and CRP to evaluate for temporal arteritis. Previous MRI did show a lacunar infarct  recommended patient take daily aspirin for stroke prevention and follow-up with primary care for close management of vascular risk factors.  Patient wakes regularly, has morning headaches, has gained weight and is obese will refer for sleep evaluation for sleep apnea.   [name], [facility], [facility], APRN  [facility] [address], [place], [place] [address] [phone]

## 2017-10-07 ENCOUNTER — Ambulatory Visit: Payer: Medicaid Other | Admitting: Nurse Practitioner

## 2017-10-07 ENCOUNTER — Telehealth: Payer: Self-pay

## 2017-10-07 NOTE — Telephone Encounter (Signed)
Patient no show for appt today. 

## 2017-10-10 ENCOUNTER — Encounter: Payer: Self-pay | Admitting: Nurse Practitioner

## 2017-11-28 ENCOUNTER — Ambulatory Visit: Payer: Self-pay | Admitting: Urology

## 2017-12-26 ENCOUNTER — Ambulatory Visit (INDEPENDENT_AMBULATORY_CARE_PROVIDER_SITE_OTHER): Payer: Medicaid Other | Admitting: Urology

## 2017-12-26 ENCOUNTER — Encounter: Payer: Self-pay | Admitting: Urology

## 2017-12-26 VITALS — BP 138/81 | HR 67 | Resp 16 | Ht 69.0 in | Wt 240.8 lb

## 2017-12-26 DIAGNOSIS — E291 Testicular hypofunction: Secondary | ICD-10-CM | POA: Diagnosis not present

## 2017-12-26 DIAGNOSIS — Z125 Encounter for screening for malignant neoplasm of prostate: Secondary | ICD-10-CM

## 2017-12-26 DIAGNOSIS — N529 Male erectile dysfunction, unspecified: Secondary | ICD-10-CM

## 2017-12-26 LAB — URINALYSIS, COMPLETE
Bilirubin, UA: NEGATIVE
Glucose, UA: NEGATIVE
KETONES UA: NEGATIVE
LEUKOCYTES UA: NEGATIVE
Nitrite, UA: NEGATIVE
Protein, UA: NEGATIVE
SPEC GRAV UA: 1.025 (ref 1.005–1.030)
Urobilinogen, Ur: 2 mg/dL — ABNORMAL HIGH (ref 0.2–1.0)
pH, UA: 6.5 (ref 5.0–7.5)

## 2017-12-26 LAB — MICROSCOPIC EXAMINATION
BACTERIA UA: NONE SEEN
EPITHELIAL CELLS (NON RENAL): NONE SEEN /HPF (ref 0–10)

## 2017-12-26 NOTE — Progress Notes (Signed)
12/26/2017 8:47 AM   Walter Hodges [birth year] [id number]  Referring provider: Hoy Register, MD [address] [place], [place] [address]  Chief Complaint  Patient presents with  . Erectile Dysfunction    HPI: Walter Hodges is a 62 year old male referred for evaluation of erectile dysfunction.  He presents with a 2-year history of partial erections which are not firm enough for penetration.  He denies pain or curvature with erections.  He has tried Cialis which was effective however this medication is cost prohibitive.  He has tried generic sildenafil however has headache and does not like to take this medication.  He had a total testosterone level drawn in July 2017 which was normal at 421 ng/dL.  His free testosterone level was low at 32.4 pg/mL and bioavailable testosterone was low at 59.6 ng/dL.  He denies decreased libido, tiredness or fatigue.  Organic risk factors include hypertension, antihypertensive medication.  There is no previous history of tobacco use.  He has no bothersome lower urinary tract symptoms.  Denies dysuria or gross hematuria.  Denies flank, abdominal, pelvic or scrotal pain.   PMH: Past Medical History:  Diagnosis Date  . Arthritis   . Bell's palsy 2010   lasted one day ago, told that he had a ministroke  . Headache   . Hollenhorst plaque, right eye   . Hypertension    followed by Dr. Nita Hodges" with [facility]      Surgical History: Past Surgical History:  Procedure Laterality Date  . CIRCUMCISION     done as a child- given inhalation   . JOINT REPLACEMENT Bilateral   . KNEE CLOSED REDUCTION Left 03/19/2016   Procedure: CLOSED MANIPULATION LEFT KNEE;  Surgeon: Walter Kos, MD;  [facility];  Service: Orthopedics;  Laterality: Left;  . TEE WITHOUT CARDIOVERSION N/A 02/19/2016   Procedure: TRANSESOPHAGEAL ECHOCARDIOGRAM (TEE);  Surgeon: Walter Fair, MD;  Location[facility];  Service: Cardiovascular;  Laterality: N/A;  . TOTAL  KNEE ARTHROPLASTY Right 05/26/2015   Procedure: RIGHT TOTAL KNEE ARTHROPLASTY;  Surgeon: Walter Kos, MD;  Location: [facility];  Service: Orthopedics;  Laterality: Right;  . TOTAL KNEE ARTHROPLASTY Left 01/01/2016   Procedure: LEFT TOTAL KNEE ARTHROPLASTY;  Surgeon: Walter Kos, MD;  Location[facility];  Service: Orthopedics;  Laterality: Left;    Home Medications:  Allergies as of 12/26/2017      Reactions   No Known Allergies       Medication List        Accurate as of 12/26/17  8:47 AM. Always use your most recent med list.          amLODipine 10 MG tablet Commonly known as:  NORVASC       Allergies:  Allergies  Allergen Reactions  . No Known Allergies     Family History: Family History  Problem Relation Age of Onset  . Headache Neg Hx     Social History:  reports that he has never smoked. He has never used smokeless tobacco. He reports that he does not drink alcohol or use drugs.  ROS: UROLOGY Frequent Urination?: No Hard to postpone urination?: No Burning/pain with urination?: No Get up at night to urinate?: Yes Leakage of urine?: No Urine stream starts and stops?: No Trouble starting stream?: No Do you have to strain to urinate?: No Blood in urine?: No Urinary tract infection?: No Sexually transmitted disease?: No Injury to kidneys or bladder?: No Painful intercourse?: No Weak stream?: No Erection problems?:  Yes Penile pain?: No  Gastrointestinal Nausea?: No Vomiting?: No Indigestion/heartburn?: No Diarrhea?: No Constipation?: No  Constitutional Fever: No Night sweats?: No Weight loss?: No Fatigue?: No  Skin Skin rash/lesions?: No Itching?: No  Eyes Blurred vision?: No Double vision?: No  Ears/Nose/Throat Sore throat?: No Sinus problems?: No  Hematologic/Lymphatic Swollen glands?: No Easy bruising?: No  Cardiovascular Leg swelling?: No Chest pain?: No  Respiratory Cough?: No Shortness of breath?: No  Endocrine Excessive  thirst?: No  Musculoskeletal Back pain?: No Joint pain?: No  Neurological Headaches?: No Dizziness?: No  Psychologic Depression?: No Anxiety?: No  Physical Exam: BP 138/81   Pulse 67   Resp 16   Ht 5\' 9"  (1.753 m)   Wt 240 lb 12.8 oz (109.2 kg)   SpO2 98%   BMI 35.56 kg/m   Constitutional:  Alert and oriented, No acute distress. HEENT: [place] AT, moist mucus membranes.  Trachea midline, no masses. Cardiovascular: No clubbing, cyanosis, or edema. Respiratory: Normal respiratory effort, no increased work of breathing. GI: Abdomen is soft, nontender, nondistended, no abdominal masses GU: No CVA tenderness Lymph: No cervical or inguinal lymphadenopathy. Skin: No rashes, bruises or suspicious lesions. Neurologic: Grossly intact, no focal deficits, moving all 4 extremities. Psychiatric: Normal mood and affect.  Laboratory Data: Lab Results  Component Value Date   WBC 6.1 03/15/2016   HGB 11.3 (L) 03/15/2016   HCT 34.4 (L) 03/15/2016   MCV 80.4 03/15/2016   PLT 370 03/15/2016    Lab Results  Component Value Date   CREATININE 0.66 (L) 05/17/2016    Lab Results  Component Value Date   TESTOSTERONE 421 03/15/2016    Lab Results  Component Value Date   HGBA1C 5.8 01/07/2016    Urinalysis    Component Value Date/Time   COLORURINE YELLOW 12/29/2015 0934   APPEARANCEUR CLEAR 12/29/2015 0934   LABSPEC 1.019 12/29/2015 0934   PHURINE 6.5 12/29/2015 0934   GLUCOSEU NEGATIVE 12/29/2015 0934   HGBUR NEGATIVE 12/29/2015 0934   BILIRUBINUR NEGATIVE 12/29/2015 0934   KETONESUR NEGATIVE 12/29/2015 0934   PROTEINUR NEGATIVE 12/29/2015 0934   UROBILINOGEN 1.0 05/16/2015 0926   NITRITE NEGATIVE 12/29/2015 0934   LEUKOCYTESUR NEGATIVE 12/29/2015 0934     Assessment & Plan:   62 year old male with erectile dysfunction.  He was requesting testosterone replacement due to his ED.  He was informed that it is unlikely testosterone replacement would resolve his erectile  dysfunction as most cases of ED are secondary to blood flow abnormalities.  He is only had one testosterone level and was informed he would need a second prior to insurance approval.  Will repeat a free/total testosterone.  We discussed methods of testosterone replacement including injections and topicals.  If his testosterone remains low he desires to start injections and it was again reiterated this may not improve his ED.  If he stays on TRT long-term potential side effects and need for monitoring was discussed.  Potential side effects of testosterone replacement were discussed including stimulation of benign prostatic growth with lower urinary tract symptoms; erythrocytosis; edema; gynecomastia; worsening sleep apnea; venous thromboembolism; testicular atrophy and infertility. Recent studies suggesting an increased incidence of heart attack and stroke in patients taking testosterone was discussed. He was informed there is conflicting evidence regarding the impact of testosterone therapy on cardiovascular risk. The theoretical risk of growth stimulation of an undetected prostate cancer was also discussed.  He was informed that current evidence does not provide any definitive answers regarding the risks of testosterone  therapy on prostate cancer and cardiovascular disease. The need for periodic monitoring of his testosterone level, PSA, hematocrit and DRE was discussed.   Riki Altes, MD  [facility] [address], [address] [place], [place] [address] [phone]

## 2017-12-28 LAB — TESTOSTERONE, FREE, TOTAL, SHBG
Sex Hormone Binding: 82.4 nmol/L — ABNORMAL HIGH (ref 19.3–76.4)
TESTOSTERONE: 451 ng/dL (ref 264–916)
Testosterone, Free: 3.5 pg/mL — ABNORMAL LOW (ref 6.6–18.1)

## 2017-12-28 LAB — PSA: PROSTATE SPECIFIC AG, SERUM: 3.1 ng/mL (ref 0.0–4.0)

## 2017-12-28 LAB — LUTEINIZING HORMONE: LH: 6.8 m[IU]/mL (ref 1.7–8.6)

## 2017-12-30 ENCOUNTER — Telehealth: Payer: Self-pay | Admitting: Family Medicine

## 2017-12-30 ENCOUNTER — Other Ambulatory Visit: Payer: Self-pay | Admitting: Urology

## 2017-12-30 MED ORDER — TESTOSTERONE CYPIONATE 200 MG/ML IM SOLN: 200.0000 mg | INTRAMUSCULAR | 0 refills | Status: DC

## 2017-12-30 NOTE — Telephone Encounter (Signed)
Patient notified and RX was sent to pharmacy 

## 2017-12-30 NOTE — Telephone Encounter (Signed)
-----   Message from Riki AltesScott C Stoioff, MD sent at 12/30/2017 12:58 PM EDT ----- [facility]  I printed the prescription.

## 2018-01-06 ENCOUNTER — Ambulatory Visit (INDEPENDENT_AMBULATORY_CARE_PROVIDER_SITE_OTHER): Payer: Medicaid Other

## 2018-01-06 DIAGNOSIS — N529 Male erectile dysfunction, unspecified: Secondary | ICD-10-CM | POA: Diagnosis not present

## 2018-01-06 MED ORDER — TESTOSTERONE CYPIONATE 200 MG/ML IM SOLN
200.0000 mg | Freq: Once | INTRAMUSCULAR | Status: AC
  Administered 2018-01-06: 200 mg via INTRAMUSCULAR

## 2018-01-06 NOTE — Progress Notes (Signed)
Testosterone IM Injection  Due to Hypogonadism patient is present today for a Testosterone Injection.  Medication: Testosterone Cypionate Dose: 1ml Location: right upper outer buttocks Lot: [id number] Exp:07/2019  Patient tolerated well, no complications were noted  Preformed by: Nydia Bouton, CMA  Follow up: 2 weeks for next testosterone injection.

## 2018-01-16 ENCOUNTER — Ambulatory Visit (INDEPENDENT_AMBULATORY_CARE_PROVIDER_SITE_OTHER): Payer: Medicaid Other

## 2018-01-16 ENCOUNTER — Telehealth: Payer: Self-pay | Admitting: Urology

## 2018-01-16 DIAGNOSIS — E291 Testicular hypofunction: Secondary | ICD-10-CM | POA: Diagnosis not present

## 2018-01-16 MED ORDER — TESTOSTERONE CYPIONATE 200 MG/ML IM SOLN
200.0000 mg | Freq: Once | INTRAMUSCULAR | Status: AC
  Administered 2018-01-16: 200 mg via INTRAMUSCULAR

## 2018-01-16 NOTE — Telephone Encounter (Signed)
Pt needs a refill of testosterone.  [facility] uses [facility] on [address].

## 2018-01-16 NOTE — Telephone Encounter (Signed)
Script sent to wrong pharm please resend thanks

## 2018-01-16 NOTE — Progress Notes (Signed)
Testosterone IM Injection  Due to Hypogonadism patient is present today for a Testosterone Injection.  Medication: Testosterone Cypionate Dose: 1mL Location: left upper outer buttocks Lot: [id number] Exp:07/2019  Patient tolerated well, no complications were noted  Preformed by: Debbe Bales, CMA  Follow up: As scheduled

## 2018-01-17 NOTE — Telephone Encounter (Signed)
He has been receiving testosterone injections in the office x2.  Where was this medication coming from?

## 2018-01-20 ENCOUNTER — Ambulatory Visit: Payer: Medicaid Other

## 2018-01-20 NOTE — Telephone Encounter (Signed)
Called pt no answer. LM for pt to call back with additional info on where he has been getting medication.

## 2018-01-23 MED ORDER — TESTOSTERONE CYPIONATE 200 MG/ML IM SOLN: 200.0000 mg | INTRAMUSCULAR | 0 refills | Status: DC

## 2018-01-23 NOTE — Telephone Encounter (Signed)
Script refilled and faxed

## 2018-01-25 ENCOUNTER — Telehealth: Payer: Self-pay | Admitting: Urology

## 2018-01-25 NOTE — Telephone Encounter (Signed)
Pt called stating his testosterone has not been sent to his pharmacy at [address] [facility]. Pt is asking for his Rx. Please advise pt at [phone].

## 2018-01-26 ENCOUNTER — Other Ambulatory Visit: Payer: Self-pay

## 2018-01-26 MED ORDER — TESTOSTERONE CYPIONATE 200 MG/ML IM SOLN: 200.0000 mg | INTRAMUSCULAR | 0 refills | Status: DC

## 2018-01-31 ENCOUNTER — Ambulatory Visit (INDEPENDENT_AMBULATORY_CARE_PROVIDER_SITE_OTHER): Payer: Medicaid Other

## 2018-01-31 DIAGNOSIS — E291 Testicular hypofunction: Secondary | ICD-10-CM

## 2018-01-31 MED ORDER — TESTOSTERONE CYPIONATE 200 MG/ML IM SOLN
200.0000 mg | Freq: Once | INTRAMUSCULAR | Status: AC
Start: 2018-01-31 — End: 2018-01-31
  Administered 2018-01-31: 200 mg via INTRAMUSCULAR

## 2018-01-31 NOTE — Addendum Note (Signed)
Addended by: Vickki Hearing on: 01/31/2018 11:42 AM   Modules accepted: Orders, Level of Service

## 2018-01-31 NOTE — Progress Notes (Signed)
Testosterone IM Injection  Due to Hypogonadism patient is present today for a Testosterone Injection.  Medication: Testosterone Cypionate Dose: 1ml Location: right upper outer buttocks Lot: [id number] Exp:06/2019  Patient tolerated well, no complications were noted  Preformed by: Nydia Bouton, CMA  Follow up: As scheduled

## 2018-02-03 ENCOUNTER — Ambulatory Visit: Payer: Medicaid Other

## 2018-02-14 ENCOUNTER — Ambulatory Visit (INDEPENDENT_AMBULATORY_CARE_PROVIDER_SITE_OTHER): Payer: Medicaid Other

## 2018-02-14 DIAGNOSIS — N529 Male erectile dysfunction, unspecified: Secondary | ICD-10-CM

## 2018-02-14 DIAGNOSIS — E291 Testicular hypofunction: Secondary | ICD-10-CM | POA: Diagnosis not present

## 2018-02-14 MED ORDER — TESTOSTERONE CYPIONATE 200 MG/ML IM SOLN
200.0000 mg | Freq: Once | INTRAMUSCULAR | Status: AC
  Administered 2018-02-14: 200 mg via INTRAMUSCULAR

## 2018-02-14 NOTE — Progress Notes (Signed)
Testosterone IM Injection  Due to Hypogonadism patient is present today for a Testosterone Injection.  Medication: Testosterone Cypionate Dose: 1mL Location: left upper outer buttocks Lot: [id number] Exp:07/2019  Patient tolerated well, no complications were noted  Preformed by: Debbe Baleskinawa Abdoul Encinas, CMA  Follow up: In 2 weeks for next injection.

## 2018-02-28 ENCOUNTER — Encounter: Payer: Self-pay | Admitting: Urology

## 2018-02-28 ENCOUNTER — Ambulatory Visit: Payer: Medicaid Other

## 2018-03-03 ENCOUNTER — Ambulatory Visit (INDEPENDENT_AMBULATORY_CARE_PROVIDER_SITE_OTHER): Payer: Medicaid Other

## 2018-03-03 DIAGNOSIS — E291 Testicular hypofunction: Secondary | ICD-10-CM | POA: Diagnosis not present

## 2018-03-03 MED ORDER — TESTOSTERONE CYPIONATE 200 MG/ML IM SOLN
200.0000 mg | Freq: Once | INTRAMUSCULAR | Status: AC
  Administered 2018-03-03: 200 mg via INTRAMUSCULAR

## 2018-03-03 NOTE — Progress Notes (Signed)
Testosterone IM Injection  Due to Hypogonadism patient is present today for a Testosterone Injection.  Medication: Testosterone Cypionate Dose: 1mL Location: left upper outer buttocks Lot: [id number] Exp:10/2019  Patient tolerated well, no complications were noted  Preformed VW:[id number]:Deshanae Lindo,CMA   Follow up: As scheduled

## 2018-03-16 ENCOUNTER — Ambulatory Visit (INDEPENDENT_AMBULATORY_CARE_PROVIDER_SITE_OTHER): Payer: Medicaid Other | Admitting: Family Medicine

## 2018-03-16 DIAGNOSIS — E291 Testicular hypofunction: Secondary | ICD-10-CM

## 2018-03-16 MED ORDER — TESTOSTERONE CYPIONATE 200 MG/ML IM SOLN
200.0000 mg | Freq: Once | INTRAMUSCULAR | Status: AC
  Administered 2018-03-16: 200 mg via INTRAMUSCULAR

## 2018-03-16 NOTE — Progress Notes (Signed)
Testosterone IM Injection  Due to Hypogonadism patient is present today for a Testosterone Injection.  Medication: Testosterone Cypionate Dose: 1ML Location: right upper outer buttocks Lot: [id number] Exp:10/2019  Patient tolerated well, no complications were noted  Preformed by: Teressa Lowerarrie Erminio Nygard, CMA  Follow up: 2 weeks

## 2018-03-17 ENCOUNTER — Ambulatory Visit: Payer: Medicaid Other

## 2018-03-29 ENCOUNTER — Ambulatory Visit (INDEPENDENT_AMBULATORY_CARE_PROVIDER_SITE_OTHER): Payer: Medicaid Other | Admitting: Family Medicine

## 2018-03-29 DIAGNOSIS — E291 Testicular hypofunction: Secondary | ICD-10-CM

## 2018-03-29 MED ORDER — TESTOSTERONE CYPIONATE 200 MG/ML IM SOLN
200.0000 mg | Freq: Once | INTRAMUSCULAR | Status: AC
  Administered 2018-03-29: 200 mg via INTRAMUSCULAR

## 2018-03-29 NOTE — Progress Notes (Signed)
Testosterone IM Injection  Due to Hypogonadism patient is present today for a Testosterone Injection.  Medication: Testosterone Cypionate Dose: 1ml Location: left upper outer buttocks Lot: [id number] Exp:4/21  Patient tolerated well, no complications were noted  Preformed by: Teressa Lowerarrie Garrison, CMA

## 2018-03-30 ENCOUNTER — Ambulatory Visit: Payer: Medicaid Other

## 2018-04-12 ENCOUNTER — Other Ambulatory Visit: Payer: Self-pay

## 2018-04-12 ENCOUNTER — Ambulatory Visit (INDEPENDENT_AMBULATORY_CARE_PROVIDER_SITE_OTHER): Payer: Medicaid Other

## 2018-04-12 DIAGNOSIS — E291 Testicular hypofunction: Secondary | ICD-10-CM | POA: Diagnosis not present

## 2018-04-12 MED ORDER — TESTOSTERONE CYPIONATE 200 MG/ML IM SOLN
200.0000 mg | Freq: Once | INTRAMUSCULAR | Status: AC
  Administered 2018-04-12: 200 mg via INTRAMUSCULAR

## 2018-04-26 ENCOUNTER — Encounter: Payer: Self-pay | Admitting: Urology

## 2018-04-26 ENCOUNTER — Ambulatory Visit: Payer: Medicaid Other

## 2018-04-27 ENCOUNTER — Ambulatory Visit (INDEPENDENT_AMBULATORY_CARE_PROVIDER_SITE_OTHER): Payer: Medicaid Other

## 2018-04-27 DIAGNOSIS — E291 Testicular hypofunction: Secondary | ICD-10-CM | POA: Diagnosis not present

## 2018-04-27 MED ORDER — TESTOSTERONE CYPIONATE 200 MG/ML IM SOLN
200.0000 mg | Freq: Once | INTRAMUSCULAR | Status: AC
  Administered 2018-04-27: 200 mg via INTRAMUSCULAR

## 2018-04-27 NOTE — Progress Notes (Signed)
Testosterone IM Injection  Due to Hypogonadism patient is present today for a Testosterone Injection.  Medication: Testosterone Cypionate Dose: 1ml Location: right upper outer buttocks Lot: [id number] Exp:12/2019  Patient tolerated well, no complications were noted  Preformed by: Nydia Boutonhristopher Ramon Brant, CMA  Follow up: As Scheduled

## 2018-05-11 ENCOUNTER — Ambulatory Visit (INDEPENDENT_AMBULATORY_CARE_PROVIDER_SITE_OTHER): Payer: Medicaid Other | Admitting: Urology

## 2018-05-11 ENCOUNTER — Ambulatory Visit (INDEPENDENT_AMBULATORY_CARE_PROVIDER_SITE_OTHER): Payer: Medicaid Other

## 2018-05-11 ENCOUNTER — Encounter: Payer: Self-pay | Admitting: Urology

## 2018-05-11 VITALS — BP 151/89 | HR 76 | Ht 69.0 in | Wt 250.0 lb

## 2018-05-11 DIAGNOSIS — N5201 Erectile dysfunction due to arterial insufficiency: Secondary | ICD-10-CM | POA: Diagnosis not present

## 2018-05-11 DIAGNOSIS — E291 Testicular hypofunction: Secondary | ICD-10-CM | POA: Diagnosis not present

## 2018-05-11 MED ORDER — TESTOSTERONE CYPIONATE 200 MG/ML IM SOLN
200.0000 mg | Freq: Once | INTRAMUSCULAR | Status: AC
  Administered 2018-05-11: 200 mg via INTRAMUSCULAR

## 2018-05-11 NOTE — Progress Notes (Signed)
05/11/2018 9:37 AM   Walter Hodges 1956-04-18 [id number]  Referring provider: Hoy Register, MD [address] [place], [place] [address]  Chief Complaint  Patient presents with  . Hypogonadism    HPI: 62 year-old male previously seen for hypogonadism and erectile dysfunction.  He started TRT in May 2019.  His energy level has improved however he still complains of the ED.  He has a friend who is doing intracavernosal injections and wanted to discuss this option for ED.   PMH: Past Medical History:  Diagnosis Date  . Arthritis   . Bell's palsy 2010   lasted one day ago, told that he had a ministroke  . Headache   . Hollenhorst plaque, right eye   . Hypertension    followed by Dr. Nita Sells" with [facility]      Surgical History: Past Surgical History:  Procedure Laterality Date  . CIRCUMCISION     done as a child- given inhalation   . JOINT REPLACEMENT Bilateral   . KNEE CLOSED REDUCTION Left 03/19/2016   Procedure: CLOSED MANIPULATION LEFT KNEE;  Surgeon: Tarry Kos, MD;  Location: [facility] OR;  Service: Orthopedics;  Laterality: Left;  . TEE WITHOUT CARDIOVERSION N/A 02/19/2016   Procedure: TRANSESOPHAGEAL ECHOCARDIOGRAM (TEE);  Surgeon: Thurmon Fair, MD;  Location: [facility] ENDOSCOPY;  Service: Cardiovascular;  Laterality: N/A;  . TOTAL KNEE ARTHROPLASTY Right 05/26/2015   Procedure: RIGHT TOTAL KNEE ARTHROPLASTY;  Surgeon: Tarry Kos, MD;  Location: [facility] OR;  Service: Orthopedics;  Laterality: Right;  . TOTAL KNEE ARTHROPLASTY Left 01/01/2016   Procedure: LEFT TOTAL KNEE ARTHROPLASTY;  Surgeon: Tarry Kos, MD;  Location: [facility] OR;  Service: Orthopedics;  Laterality: Left;    Home Medications:  Allergies as of 05/11/2018      Reactions   No Known Allergies       Medication List        Accurate as of 05/11/18  9:37 AM. Always use your most recent med list.          amLODipine 10 MG tablet Commonly known as:  NORVASC   oxyCODONE-acetaminophen 10-325 MG  tablet Commonly known as:  PERCOCET TK 1 T PO FID PRF PAIN   testosterone cypionate 200 MG/ML injection Commonly known as:  DEPOTESTOSTERONE CYPIONATE Inject 1 mL (200 mg total) into the muscle every 14 (fourteen) days.       Allergies:  Allergies  Allergen Reactions  . No Known Allergies     Family History: Family History  Problem Relation Age of Onset  . Headache Neg Hx     Social History:  reports that he has never smoked. He has never used smokeless tobacco. He reports that he does not drink alcohol or use drugs.  ROS: UROLOGY Frequent Urination?: No Hard to postpone urination?: No Burning/pain with urination?: No Get up at night to urinate?: No Leakage of urine?: No Urine stream starts and stops?: No Trouble starting stream?: No Do you have to strain to urinate?: No Blood in urine?: No Urinary tract infection?: No Sexually transmitted disease?: No Injury to kidneys or bladder?: No Painful intercourse?: No Weak stream?: No Erection problems?: No Penile pain?: No  Gastrointestinal Nausea?: No Vomiting?: No Indigestion/heartburn?: No Diarrhea?: No Constipation?: No  Constitutional Fever: No Night sweats?: No Weight loss?: No Fatigue?: No  Skin Skin rash/lesions?: No Itching?: No  Eyes Blurred vision?: No Double vision?: No  Ears/Nose/Throat Sore throat?: No Sinus problems?: No  Hematologic/Lymphatic Swollen glands?: No Easy bruising?: No  Cardiovascular Leg swelling?: No Chest pain?: No  Respiratory Cough?: No Shortness of breath?: No  Endocrine Excessive thirst?: No  Musculoskeletal Back pain?: No Joint pain?: No  Neurological Headaches?: No Dizziness?: No  Psychologic Depression?: No Anxiety?: No  Physical Exam: BP (!) 151/89 (BP Location: Left Arm, Patient Position: Sitting, Cuff Size: Large)   Pulse 76   Ht 5\' 9"  (1.753 m)   Wt 250 lb (113.4 kg)   BMI 36.92 kg/m   Constitutional:  Alert and oriented, No acute  distress. HEENT:  AT, moist mucus membranes.  Trachea midline, no masses. Cardiovascular: No clubbing, cyanosis, or edema. Respiratory: Normal respiratory effort, no increased work of breathing. GI: Abdomen is soft, nontender, nondistended, no abdominal masses GU: No CVA tenderness Lymph: No cervical or inguinal lymphadenopathy. Skin: No rashes, bruises or suspicious lesions. Neurologic: Grossly intact, no focal deficits, moving all 4 extremities. Psychiatric: Normal mood and affect.   Assessment & Plan:   62 year old male with erectile dysfunction.  I discussed intracavernosal injections and potential risks of priapism and corporal scarring.  He would like to pursue this option.  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  Riki Altes, MD  [facility] [address], [address] [place], [place] [address] [phone]

## 2018-05-14 ENCOUNTER — Encounter: Payer: Self-pay | Admitting: Urology

## 2018-05-25 ENCOUNTER — Ambulatory Visit (INDEPENDENT_AMBULATORY_CARE_PROVIDER_SITE_OTHER): Payer: Medicaid Other

## 2018-05-25 DIAGNOSIS — N5201 Erectile dysfunction due to arterial insufficiency: Secondary | ICD-10-CM | POA: Diagnosis not present

## 2018-05-25 DIAGNOSIS — E291 Testicular hypofunction: Secondary | ICD-10-CM | POA: Diagnosis not present

## 2018-05-25 MED ORDER — TESTOSTERONE CYPIONATE 200 MG/ML IM SOLN
200.0000 mg | Freq: Once | INTRAMUSCULAR | Status: AC
  Administered 2018-05-25: 200 mg via INTRAMUSCULAR

## 2018-05-25 NOTE — Progress Notes (Signed)
Testosterone IM Injection  Due to Hypogonadism patient is present today for a Testosterone Injection.  Medication: Testosterone Cypionate Dose: 1ml Location: right upper outer buttocks Lot: [id number] Exp:06/2019  Patient tolerated well, no complications were noted  Preformed by: Nydia Boutonhristopher Nishanth Mccaughan, CMA  Follow up: As Scheduled

## 2018-05-30 ENCOUNTER — Encounter: Payer: Self-pay | Admitting: Urology

## 2018-05-30 ENCOUNTER — Ambulatory Visit (INDEPENDENT_AMBULATORY_CARE_PROVIDER_SITE_OTHER): Payer: Medicaid Other | Admitting: Urology

## 2018-05-30 VITALS — BP 122/71 | HR 73 | Ht 70.0 in | Wt 252.1 lb

## 2018-05-30 DIAGNOSIS — N5201 Erectile dysfunction due to arterial insufficiency: Secondary | ICD-10-CM | POA: Diagnosis not present

## 2018-05-30 NOTE — Progress Notes (Signed)
05/30/2018 1:03 PM   Walter Hodges Feb 02, 1956 [id number]  Referring provider: Charlott Rakes, MD [address] [place], [place] [address]  Chief Complaint  Patient presents with  . Erectile Dysfunction    HPI: Patient is a 62 year old African-American male with testosterone deficiency and erectile dysfunction who presents today for a Trimix titration.  Background history 62 year-old male previously seen for hypogonadism and erectile dysfunction.  He started TRT in May 2019.  His energy level has improved however he still complains of the ED.  He has a friend who is doing intracavernosal injections and wanted to discuss this option for ED.  Patient denies any history of sickle cell disease, multiple myeloma, leukemia or Peyronie's disease.  He states that he has taken Viagra in the past and only achieved a semi-erection.    PMH: Past Medical History:  Diagnosis Date  . Arthritis   . Bell's palsy 2010   lasted one day ago, told that he had a ministroke  . Headache   . Hollenhorst plaque, right eye   . Hypertension    followed by Dr. Velta Addison" with [facility]      Surgical History: Past Surgical History:  Procedure Laterality Date  . CIRCUMCISION     done as a child- given inhalation   . JOINT REPLACEMENT Bilateral   . KNEE CLOSED REDUCTION Left 03/19/2016   Procedure: CLOSED MANIPULATION LEFT KNEE;  Surgeon: Leandrew Koyanagi, MD;  Location: [facility];  Service: Orthopedics;  Laterality: Left;  . TEE WITHOUT CARDIOVERSION N/A 02/19/2016   Procedure: TRANSESOPHAGEAL ECHOCARDIOGRAM (TEE);  Surgeon: Sanda Klein, MD;  Location: [facility];  Service: Cardiovascular;  Laterality: N/A;  . TOTAL KNEE ARTHROPLASTY Right 05/26/2015   Procedure: RIGHT TOTAL KNEE ARTHROPLASTY;  Surgeon: Leandrew Koyanagi, MD;  Location: [facility];  Service: Orthopedics;  Laterality: Right;  . TOTAL KNEE ARTHROPLASTY Left 01/01/2016   Procedure: LEFT TOTAL KNEE ARTHROPLASTY;  Surgeon: Leandrew Koyanagi, MD;   Location: [facility];  Service: Orthopedics;  Laterality: Left;    Home Medications:  Allergies as of 05/30/2018      Reactions   No Known Allergies       Medication List        Accurate as of 05/30/18  1:03 PM. Always use your most recent med list.          amLODipine 10 MG tablet Commonly known as:  NORVASC   oxyCODONE-acetaminophen 10-325 MG tablet Commonly known as:  PERCOCET TK 1 T PO FID PRF PAIN   testosterone cypionate 200 MG/ML injection Commonly known as:  DEPOTESTOSTERONE CYPIONATE Inject 1 mL (200 mg total) into the muscle every 14 (fourteen) days.       Allergies:  Allergies  Allergen Reactions  . No Known Allergies     Family History: Family History  Problem Relation Age of Onset  . Headache Neg Hx     Social History:  reports that he has never smoked. He has never used smokeless tobacco. He reports that he does not drink alcohol or use drugs.  ROS: UROLOGY Frequent Urination?: No Hard to postpone urination?: No Burning/pain with urination?: No Get up at night to urinate?: No Leakage of urine?: No Urine stream starts and stops?: No Trouble starting stream?: No Do you have to strain to urinate?: No Blood in urine?: No Urinary tract infection?: No Sexually transmitted disease?: No Injury to kidneys or bladder?: No Painful intercourse?: No Weak stream?: No Erection problems?: No Penile pain?: No  Gastrointestinal Nausea?: No Vomiting?: No Indigestion/heartburn?: No Diarrhea?: No Constipation?: No  Constitutional Fever: No Night sweats?: No Weight loss?: No Fatigue?: No  Skin Skin rash/lesions?: No Itching?: No  Eyes Blurred vision?: No Double vision?: No  Ears/Nose/Throat Sore throat?: No Sinus problems?: No  Hematologic/Lymphatic Swollen glands?: No Easy bruising?: No  Cardiovascular Leg swelling?: No Chest pain?: No  Respiratory Cough?: No Shortness of breath?: No  Endocrine Excessive thirst?:  No  Musculoskeletal Back pain?: No Joint pain?: No  Neurological Headaches?: No Dizziness?: No  Psychologic Depression?: No Anxiety?: No  Physical Exam: BP 122/71 (BP Location: Left Arm, Patient Position: Sitting, Cuff Size: Normal)   Pulse 73   Ht 5' 10"  (1.778 m)   Wt 252 lb 1.6 oz (114.4 kg)   BMI 36.17 kg/m   Constitutional: Well nourished. Alert and oriented, No acute distress. HEENT: [place] AT, moist mucus membranes. Trachea midline, no masses. Cardiovascular: No clubbing, cyanosis, or edema. Respiratory: Normal respiratory effort, no increased work of breathing. GI: Abdomen is soft, non tender, non distended, no abdominal masses. Liver and spleen not palpable.  No hernias appreciated.  Stool sample for occult testing is not indicated.   GU: No CVA tenderness.  No bladder fullness or masses.  Patient with circumcised phallus.  Urethral meatus is patent.  No penile discharge. No penile lesions or rashes.  Skin: No rashes, bruises or suspicious lesions. Lymph: No cervical or inguinal adenopathy. Neurologic: Grossly intact, no focal deficits, moving all 4 extremities. Psychiatric: Normal mood and affect.  Procedure Patient's right corpus cavernosum is identified.  An area near the base of the penis is cleansed with rubbing alcohol.  Careful to avoid the dorsal vein, 2 mcg of Trimix (papaverine 30 mg, phentolamine 1 mg and prostaglandin E1 10 mcg, Lot # [id number]  Exp. Date 06/01/2018 ) is injected at a 90 degree angle into the left corpus cavernosum near the base of the penis.  Patient experienced a very semi firm erection in 15 minutes.   2 mcg were then injected into his left corpus cavernosum.  Patient's erection was still not satisfactory.  2 mcg were again injected into right corpus cavernosum and his erection was still not satisfactory.     Assessment & Plan:    1. ED Patient will return Thursday am (06/01/2018) for second titration We will start with 6 mcg at that  time Advised patient of the condition of priapism, painful erection lasting for more than four hours, and to contact the office immediately or seek treatment in the ED   06/14/2018, [place] [address], [place], [place] [phone]

## 2018-06-01 ENCOUNTER — Telehealth: Payer: Self-pay | Admitting: Urology

## 2018-06-01 ENCOUNTER — Encounter: Payer: Self-pay | Admitting: Urology

## 2018-06-01 ENCOUNTER — Ambulatory Visit (INDEPENDENT_AMBULATORY_CARE_PROVIDER_SITE_OTHER): Payer: Medicaid Other | Admitting: Urology

## 2018-06-01 VITALS — BP 118/72 | HR 64 | Resp 16 | Ht 70.0 in | Wt 252.6 lb

## 2018-06-01 DIAGNOSIS — N5201 Erectile dysfunction due to arterial insufficiency: Secondary | ICD-10-CM

## 2018-06-01 NOTE — Telephone Encounter (Signed)
This has been called into [facility]. The pt will have an additional $28.00 fee for shipping if having it shipped to our location. They are going to contact pt to discuss.

## 2018-06-01 NOTE — Telephone Encounter (Signed)
Would you please call in a test vial of Trimix (30 mg PAPA, 1 mL phen, 50 mg prostaglandin) for Mr. Walter Hodges to [facility]?  He would like the medication shipped to our office.

## 2018-06-01 NOTE — Progress Notes (Signed)
06/01/2018 9:06 AM   [name] 09-Feb-1956 [id number]  Referring provider: Charlott Rakes, MD [address] [place], [place] [address]  Chief Complaint  Patient presents with  . Erectile dysfunction due to arterial insufficiency    HPI: Patient is a 62 year old African-American male with testosterone deficiency and erectile dysfunction who presents today for a Trimix titration.  Background history 62 year-old male previously seen for hypogonadism and erectile dysfunction.  He started TRT in May 2019.  His energy level has improved however he still complains of the ED.  He has a friend who is doing intracavernosal injections and wanted to discuss this option for ED.  Patient denies any history of sickle cell disease, multiple myeloma, leukemia or Peyronie's disease.  He states that he has taken Viagra in the past and only achieved a semi-erection.    He presented to the office two days ago for his initial Trimix titration.  He achieved a semi-rigid erection with 6 mcg of Trimix.    Today, he states that he did not experience any improvement in the erection once he left the office at his last visit.  Patient denies any gross hematuria, dysuria or suprapubic/flank pain.  Patient denies any fevers, chills, nausea or vomiting.   PMH: Past Medical History:  Diagnosis Date  . Arthritis   . Bell's palsy 2010   lasted one day ago, told that he had a ministroke  . Headache   . Hollenhorst plaque, right eye   . Hypertension    followed by Dr. Velta Addison" with [facility]      Surgical History: Past Surgical History:  Procedure Laterality Date  . CIRCUMCISION     done as a child- given inhalation   . JOINT REPLACEMENT Bilateral   . KNEE CLOSED REDUCTION Left 03/19/2016   Procedure: CLOSED MANIPULATION LEFT KNEE;  Surgeon: Leandrew Koyanagi, MD;  Location: [facility];  Service: Orthopedics;  Laterality: Left;  . TEE WITHOUT CARDIOVERSION N/A 02/19/2016   Procedure: TRANSESOPHAGEAL  ECHOCARDIOGRAM (TEE);  Surgeon: Sanda Klein, MD;  Location: [facility];  Service: Cardiovascular;  Laterality: N/A;  . TOTAL KNEE ARTHROPLASTY Right 05/26/2015   Procedure: RIGHT TOTAL KNEE ARTHROPLASTY;  Surgeon: Leandrew Koyanagi, MD;  Location: [facility];  Service: Orthopedics;  Laterality: Right;  . TOTAL KNEE ARTHROPLASTY Left 01/01/2016   Procedure: LEFT TOTAL KNEE ARTHROPLASTY;  Surgeon: Leandrew Koyanagi, MD;  Location: [facility];  Service: Orthopedics;  Laterality: Left;    Home Medications:  Allergies as of 06/01/2018      Reactions   No Known Allergies       Medication List        Accurate as of 06/01/18  9:06 AM. Always use your most recent med list.          amLODipine 10 MG tablet Commonly known as:  NORVASC   oxyCODONE-acetaminophen 10-325 MG tablet Commonly known as:  PERCOCET TK 1 T PO FID PRF PAIN   testosterone cypionate 200 MG/ML injection Commonly known as:  DEPOTESTOSTERONE CYPIONATE Inject 1 mL (200 mg total) into the muscle every 14 (fourteen) days.       Allergies:  Allergies  Allergen Reactions  . No Known Allergies     Family History: Family History  Problem Relation Age of Onset  . Headache Neg Hx     Social History:  reports that he has never smoked. He has never used smokeless tobacco. He reports that he does not drink alcohol or use drugs.  ROS: UROLOGY Frequent Urination?: No Hard to postpone urination?: No Burning/pain with urination?: No Get up at night to urinate?: No Leakage of urine?: No Urine stream starts and stops?: No Trouble starting stream?: No Do you have to strain to urinate?: No Blood in urine?: No Urinary tract infection?: No Sexually transmitted disease?: No Injury to kidneys or bladder?: No Painful intercourse?: No Weak stream?: No Erection problems?: No Penile pain?: No  Gastrointestinal Nausea?: No Vomiting?: No Indigestion/heartburn?: No Diarrhea?: No Constipation?: No  Constitutional Fever: No Night  sweats?: No Weight loss?: No Fatigue?: No  Skin Skin rash/lesions?: No Itching?: No  Eyes Blurred vision?: No Double vision?: No  Ears/Nose/Throat Sore throat?: No Sinus problems?: No  Hematologic/Lymphatic Swollen glands?: No Easy bruising?: No  Cardiovascular Leg swelling?: No Chest pain?: No  Respiratory Cough?: No Shortness of breath?: No  Endocrine Excessive thirst?: No  Musculoskeletal Back pain?: No Joint pain?: No  Neurological Headaches?: No Dizziness?: No  Psychologic Depression?: No Anxiety?: No  Physical Exam: BP 118/72   Pulse 64   Resp 16   Ht _0  (1.778 m)   Wt 252 lb 9.6 oz (114.6 kg)   BMI 36.24 kg/m   Constitutional: Well nourished. Alert and oriented, No acute distress. HEENT: [place] AT, moist mucus membranes. Trachea midline, no masses. Cardiovascular: No clubbing, cyanosis, or edema. Respiratory: Normal respiratory effort, no increased work of breathing. GI: Abdomen is soft, non tender, non distended, no abdominal masses. Liver and spleen not palpable.  No hernias appreciated.  Stool sample for occult testing is not indicated.   GU: No CVA tenderness.  No bladder fullness or masses.  Patient with circumcised phallus.  Urethral meatus is patent.  No penile discharge. No penile lesions or rashes.  Skin: No rashes, bruises or suspicious lesions. Lymph: No cervical or inguinal adenopathy. Neurologic: Grossly intact, no focal deficits, moving all 4 extremities. Psychiatric: Normal mood and affect.  Procedure Patient's left corpus cavernosum is identified.  An area near the base of the penis is cleansed with rubbing alcohol.  Careful to avoid the dorsal vein, 6 mcg of Trimix (papaverine 30 mg, phentolamine 1 mg and prostaglandin E1 10 mcg, Lot # [id number]  Exp. Date 06/01/2018 ) is injected at a 90 degree angle into the left corpus cavernosum near the base of the penis.  Patient experienced a very semi firm erection in 15 minutes.      Assessment & Plan:    1. ED Patient will return for a titration appointment with high dose Trimix Offered materials on the penile prothesis, but he declined at this time  Advised patient of the condition of priapism, painful erection lasting for more than four hours, and to contact the office immediately or seek treatment in the ED   Zara Council, [place] [address], [place], [place] [address] [phone]

## 2018-06-02 ENCOUNTER — Telehealth: Payer: Self-pay | Admitting: Urology

## 2018-06-02 NOTE — Telephone Encounter (Signed)
Patient called and left a message for you to call him. Did not say why just said he wanted you to call??  Walter Hodges

## 2018-06-05 NOTE — Telephone Encounter (Signed)
Please add Walter Hodges to my schedule Wednesday (10/02/) morning for a Trimix titration.

## 2018-06-07 ENCOUNTER — Ambulatory Visit (INDEPENDENT_AMBULATORY_CARE_PROVIDER_SITE_OTHER): Payer: Medicaid Other | Admitting: Urology

## 2018-06-07 ENCOUNTER — Encounter: Payer: Self-pay | Admitting: Urology

## 2018-06-07 VITALS — BP 127/77 | HR 77 | Ht 70.0 in | Wt 254.0 lb

## 2018-06-07 DIAGNOSIS — N5201 Erectile dysfunction due to arterial insufficiency: Secondary | ICD-10-CM

## 2018-06-07 NOTE — Progress Notes (Signed)
06/07/2018 9:04 AM   Walter Hodges 11-13-1955 [id number]  Referring provider: Charlott Rakes, MD [address] [place], [place] [address]  Chief Complaint  Patient presents with  . Erectile Dysfunction    follow up    HPI: Patient is a 62 year old African-American male with testosterone deficiency and erectile dysfunction who presents today for a Trimix titration.  Background history 62 year-old male previously seen for hypogonadism and erectile dysfunction.  He started TRT in May 2019.  His energy level has improved however he still complains of the ED.  He has a friend who is doing intracavernosal injections and wanted to discuss this option for ED.  Patient denies any history of sickle cell disease, multiple myeloma, leukemia or Peyronie's disease.  He states that he has taken Viagra in the past and only achieved a semi-erection.    He presented to the office on 06/01/2018 for his initial Trimix titration.  He achieved a semi-rigid erection with 6 mcg of Trimix.    Today, patient denies any gross hematuria, dysuria or suprapubic/flank pain.  Patient denies any fevers, chills, nausea or vomiting.     PMH: Past Medical History:  Diagnosis Date  . Arthritis   . Bell's palsy 2010   lasted one day ago, told that he had a ministroke  . Headache   . Hollenhorst plaque, right eye   . Hypertension    followed by Dr. Velta Addison" with [facility]      Surgical History: Past Surgical History:  Procedure Laterality Date  . CIRCUMCISION     done as a child- given inhalation   . JOINT REPLACEMENT Bilateral   . KNEE CLOSED REDUCTION Left 03/19/2016   Procedure: CLOSED MANIPULATION LEFT KNEE;  Surgeon: Leandrew Koyanagi, MD;  Location: [facility];  Service: Orthopedics;  Laterality: Left;  . TEE WITHOUT CARDIOVERSION N/A 02/19/2016   Procedure: TRANSESOPHAGEAL ECHOCARDIOGRAM (TEE);  Surgeon: Sanda Klein, MD;  Location: [facility];  Service: Cardiovascular;  Laterality: N/A;  .  TOTAL KNEE ARTHROPLASTY Right 05/26/2015   Procedure: RIGHT TOTAL KNEE ARTHROPLASTY;  Surgeon: Leandrew Koyanagi, MD;  Location: [facility];  Service: Orthopedics;  Laterality: Right;  . TOTAL KNEE ARTHROPLASTY Left 01/01/2016   Procedure: LEFT TOTAL KNEE ARTHROPLASTY;  Surgeon: Leandrew Koyanagi, MD;  Location: [facility];  Service: Orthopedics;  Laterality: Left;    Home Medications:  Allergies as of 06/07/2018      Reactions   No Known Allergies       Medication List        Accurate as of 06/07/18  9:04 AM. Always use your most recent med list.          amLODipine 10 MG tablet Commonly known as:  NORVASC   oxyCODONE-acetaminophen 10-325 MG tablet Commonly known as:  PERCOCET TK 1 T PO FID PRF PAIN   testosterone cypionate 200 MG/ML injection Commonly known as:  DEPOTESTOSTERONE CYPIONATE Inject 1 mL (200 mg total) into the muscle every 14 (fourteen) days.       Allergies:  Allergies  Allergen Reactions  . No Known Allergies     Family History: Family History  Problem Relation Age of Onset  . Headache Neg Hx     Social History:  reports that he has never smoked. He has never used smokeless tobacco. He reports that he does not drink alcohol or use drugs.  ROS: UROLOGY Frequent Urination?: No Hard to postpone urination?: No Burning/pain with urination?: No Get up at night to urinate?:  No Leakage of urine?: No Urine stream starts and stops?: No Trouble starting stream?: No Do you have to strain to urinate?: No Blood in urine?: No Urinary tract infection?: No Sexually transmitted disease?: No Injury to kidneys or bladder?: No Painful intercourse?: No Weak stream?: No Erection problems?: No Penile pain?: No  Gastrointestinal Nausea?: No Vomiting?: No Indigestion/heartburn?: No Diarrhea?: No Constipation?: No  Constitutional Fever: No Night sweats?: No Weight loss?: No Fatigue?: No  Skin Skin rash/lesions?: No Itching?: No  Eyes Blurred vision?: No Double  vision?: No  Ears/Nose/Throat Sore throat?: No Sinus problems?: No  Hematologic/Lymphatic Swollen glands?: No Easy bruising?: No  Cardiovascular Leg swelling?: No Chest pain?: No  Respiratory Cough?: No Shortness of breath?: No  Endocrine Excessive thirst?: No  Musculoskeletal Back pain?: No Joint pain?: No  Neurological Headaches?: No Dizziness?: No  Psychologic Depression?: No Anxiety?: No  Physical Exam: BP 127/77 (BP Location: Left Arm, Patient Position: Sitting, Cuff Size: Normal)   Pulse 77   Ht 5' 10" (1.778 m)   Wt 254 lb (115.2 kg)   BMI 36.45 kg/m   Constitutional: Well nourished. Alert and oriented, No acute distress. HEENT: [place] AT, moist mucus membranes. Trachea midline, no masses. Cardiovascular: No clubbing, cyanosis, or edema. Respiratory: Normal respiratory effort, no increased work of breathing. GI: Abdomen is soft, non tender, non distended, no abdominal masses. Liver and spleen not palpable.  No hernias appreciated.  Stool sample for occult testing is not indicated.   GU: No CVA tenderness.  No bladder fullness or masses.  Patient with circumcised phallus.  Urethral meatus is patent.  No penile discharge. No penile lesions or rashes. Scrotum without lesions, cysts, rashes and/or edema.   Skin: No rashes, bruises or suspicious lesions. Lymph: No cervical or inguinal adenopathy. Neurologic: Grossly intact, no focal deficits, moving all 4 extremities. Psychiatric: Normal mood and affect.  Procedure Patient's left corpus cavernosum is identified.  An area near the base of the penis is cleansed with rubbing alcohol.  Careful to avoid the dorsal vein, 4 mcg of Trimix (papaverine 30 mg, phentolamine 1 mg and prostaglandin E1 50 mcg, Lot # [id number]  Exp. Date 06/07/2018 ) is injected at a 90 degree angle into the left corpus cavernosum near the base of the penis.    Assessment & Plan:    1. ED He stated that the erection firmness is not to his  satisfaction, but he states he has noted some pain with the erection this time I have pulled the remaining medication to make pre-filled syringes of 5 mcg of high dose Trimix  Advised patient of the condition of priapism, painful erection lasting for more than four hours, and to contact the office immediately or seek treatment in the ED   Zara Council, [place] [address] [place], [place] [address] [phone]

## 2018-06-08 ENCOUNTER — Ambulatory Visit (INDEPENDENT_AMBULATORY_CARE_PROVIDER_SITE_OTHER): Payer: Medicaid Other | Admitting: Family Medicine

## 2018-06-08 DIAGNOSIS — E291 Testicular hypofunction: Secondary | ICD-10-CM

## 2018-06-08 MED ORDER — TESTOSTERONE CYPIONATE 200 MG/ML IM SOLN
200.0000 mg | Freq: Once | INTRAMUSCULAR | Status: AC
  Administered 2018-06-08: 200 mg via INTRAMUSCULAR

## 2018-06-08 NOTE — Progress Notes (Signed)
Testosterone IM Injection  Due to Hypogonadism patient is present today for a Testosterone Injection.  Medication: Testosterone Cypionate Dose: 1ml Location: right upper outer buttocks Lot: [id number] Exp:01/2020  Patient tolerated well, no complications were noted  Preformed by: Teressa Lower, CMA  Follow up: 14 days

## 2018-06-22 ENCOUNTER — Other Ambulatory Visit: Payer: Self-pay | Admitting: Urology

## 2018-06-22 ENCOUNTER — Ambulatory Visit: Payer: Medicaid Other

## 2018-06-22 MED ORDER — TESTOSTERONE CYPIONATE 200 MG/ML IM SOLN: 200.0000 mg | INTRAMUSCULAR | 0 refills | Status: DC

## 2018-06-22 NOTE — Telephone Encounter (Signed)
Rx was refilled.  He is due for a PSA, testosterone and hematocrit with his next injection.

## 2018-06-22 NOTE — Telephone Encounter (Signed)
Pt needs testosterone called in to [facility] on [address] ASAP.  He was due for injection today and meds were not called in.

## 2018-06-23 ENCOUNTER — Telehealth: Payer: Self-pay | Admitting: Urology

## 2018-06-23 ENCOUNTER — Ambulatory Visit (INDEPENDENT_AMBULATORY_CARE_PROVIDER_SITE_OTHER): Payer: Medicaid Other | Admitting: Family Medicine

## 2018-06-23 DIAGNOSIS — E291 Testicular hypofunction: Secondary | ICD-10-CM

## 2018-06-23 MED ORDER — TESTOSTERONE CYPIONATE 200 MG/ML IM SOLN
200.0000 mg | Freq: Once | INTRAMUSCULAR | Status: AC
  Administered 2018-06-23: 200 mg via INTRAMUSCULAR

## 2018-06-23 NOTE — Progress Notes (Signed)
Testosterone IM Injection  Due to Hypogonadism patient is present today for a Testosterone Injection.  Medication: Testosterone Cypionate Dose: Location: right upper outer buttocks Lot: [id number] Exp:10/2019  Patient tolerated well, no complications were noted  Preformed by: Teressa Lower, CMA  Follow up: 2 weeks

## 2018-07-06 ENCOUNTER — Ambulatory Visit: Payer: Medicaid Other | Admitting: Family Medicine

## 2018-07-06 DIAGNOSIS — E291 Testicular hypofunction: Secondary | ICD-10-CM

## 2018-07-06 NOTE — Progress Notes (Signed)
Testosterone IM Injection  Due to Hypogonadism patient is present today for a Testosterone Injection.  Medication: Testosterone Cypionate Dose: 1ml Location: left upper outer buttocks Lot: [id number] Exp:12/2019  Patient tolerated well, no complications were noted  Preformed by: Teressa Lower, CMA  Follow up: 2 weeks

## 2018-07-07 ENCOUNTER — Ambulatory Visit: Payer: Medicaid Other

## 2018-07-20 ENCOUNTER — Ambulatory Visit (INDEPENDENT_AMBULATORY_CARE_PROVIDER_SITE_OTHER): Payer: Medicaid Other | Admitting: Family Medicine

## 2018-07-20 DIAGNOSIS — E291 Testicular hypofunction: Secondary | ICD-10-CM

## 2018-07-20 MED ORDER — TESTOSTERONE CYPIONATE 200 MG/ML IM SOLN
200.0000 mg | Freq: Once | INTRAMUSCULAR | Status: AC
  Administered 2018-07-20: 200 mg via INTRAMUSCULAR

## 2018-07-20 NOTE — Progress Notes (Signed)
Testosterone IM Injection  Due to Hypogonadism patient is present today for a Testosterone Injection.  Medication: Testosterone Cypionate Dose: 1ML Location: right upper outer buttocks Lot: [id number] Exp:03/2020  Patient tolerated well, no complications were noted  Preformed by: Teressa Lowerarrie Neila Teem, CMA  Follow up: 1 week

## 2018-08-02 ENCOUNTER — Ambulatory Visit (INDEPENDENT_AMBULATORY_CARE_PROVIDER_SITE_OTHER): Payer: Medicaid Other

## 2018-08-02 DIAGNOSIS — E291 Testicular hypofunction: Secondary | ICD-10-CM

## 2018-08-02 MED ORDER — TESTOSTERONE CYPIONATE 200 MG/ML IM SOLN
200.0000 mg | Freq: Once | INTRAMUSCULAR | Status: AC
  Administered 2018-08-02: 200 mg via INTRAMUSCULAR

## 2018-08-02 NOTE — Progress Notes (Signed)
IM Injection  Patient is present today for an IM Injection for treatment of Hypogonadism. Drug: Testosterone  Dose: 200mg .mL Location:Left Buttocks Lot: [id number] Exp:02/2020 Patient tolerated well, no complications were noted  Preformed by: Debbe Baleskinawa Kikuye Korenek, CMA (AAMA)  Additional notes/ Follow up: RTC in 14 days for next injection

## 2018-08-06 ENCOUNTER — Encounter: Payer: Self-pay | Admitting: Emergency Medicine

## 2018-08-06 ENCOUNTER — Emergency Department
Admission: EM | Admit: 2018-08-06 | Discharge: 2018-08-06 | Disposition: A | Discharge status: Home / Self Care | Payer: Medicaid Other | Attending: Student in an Organized Health Care Education/Training Program | Admitting: Student in an Organized Health Care Education/Training Program

## 2018-08-06 DIAGNOSIS — I10 Essential (primary) hypertension: Secondary | ICD-10-CM | POA: Insufficient documentation

## 2018-08-06 DIAGNOSIS — K121 Other forms of stomatitis: Secondary | ICD-10-CM

## 2018-08-06 DIAGNOSIS — Z96652 Presence of left artificial knee joint: Secondary | ICD-10-CM | POA: Insufficient documentation

## 2018-08-06 DIAGNOSIS — K1370 Unspecified lesions of oral mucosa: Secondary | ICD-10-CM | POA: Insufficient documentation

## 2018-08-06 DIAGNOSIS — Z96651 Presence of right artificial knee joint: Secondary | ICD-10-CM | POA: Insufficient documentation

## 2018-08-06 DIAGNOSIS — K0889 Other specified disorders of teeth and supporting structures: Secondary | ICD-10-CM | POA: Diagnosis present

## 2018-08-06 MED ORDER — MAGIC MOUTHWASH W/LIDOCAINE: 5.0000 mL | Freq: Three times a day (TID) | ORAL | 0 refills | Status: DC | PRN

## 2018-08-06 MED ORDER — LIDOCAINE VISCOUS HCL 2 % MT SOLN
15.0000 mL | Freq: Once | OROMUCOSAL | Status: AC
  Administered 2018-08-06: 15 mL via OROMUCOSAL
  Filled 2018-08-06: qty 15

## 2018-08-06 NOTE — ED Provider Notes (Signed)
[facility] Emergency Department Provider Note  ____________________________________________  Time seen: Approximately 10:31 PM  I have reviewed the triage vital signs and the nursing notes.   HISTORY  Chief Complaint Dental Pain    HPI Walter Hodges is a 62 y.o. male presents emergency department for evaluation of mouth ulcers for 2 weeks.  Patient states that he gets these ulcers about every 6 months.  He has never had a follow-up with primary care regarding these.  He states that he visits the dentist frequently but has never had ulcers while he has had an appointment.  No known HIV.  He wears partial dentures.  Past Medical History:  Diagnosis Date  . Arthritis   . Bell's palsy 2010   lasted one day ago, told that he had a ministroke  . Headache   . Hollenhorst plaque, right eye   . Hypertension    followed by Dr. Nita Sells" with [facility]      Patient Active Problem List   Diagnosis Date Noted  . Acute pain of right shoulder 07/05/2016  . 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 Chronic pain of both knees 03/19/2016  . Embolism involving retinal artery   . Insomnia 02/16/2016  . Hollenhorst plaque, right eye 02/16/2016  . Broken tooth, complicated 01/23/2016  . Hypertension 01/07/2016  . Total knee replacement status 01/01/2016  . Primary osteoarthritis of right knee 05/26/2015    Past Surgical History:  Procedure Laterality Date  . CIRCUMCISION     done as a child- given inhalation   . JOINT REPLACEMENT Bilateral   . KNEE CLOSED REDUCTION Left 03/19/2016   Procedure: CLOSED MANIPULATION LEFT KNEE;  Surgeon: Tarry Kos, MD;  Location: [facility];  Service: Orthopedics;  Laterality: Left;  . TEE WITHOUT CARDIOVERSION N/A 02/19/2016   Procedure: TRANSESOPHAGEAL ECHOCARDIOGRAM (TEE);  Surgeon: Thurmon Fair, MD;  Location: [facility] ENDOSCOPY;  Service: Cardiovascular;  Laterality: N/A;  . TOTAL KNEE  ARTHROPLASTY Right 05/26/2015   Procedure: RIGHT TOTAL KNEE ARTHROPLASTY;  Surgeon: Tarry Kos, MD;  Location: [facility];  Service: Orthopedics;  Laterality: Right;  . TOTAL KNEE ARTHROPLASTY Left 01/01/2016   Procedure: LEFT TOTAL KNEE ARTHROPLASTY;  Surgeon: Tarry Kos, MD;  Location: [facility];  Service: Orthopedics;  Laterality: Left;    Prior to Admission medications   Medication Sig Start Date End Date Taking? Authorizing Provider  amLODipine (NORVASC) 10 MG tablet  12/07/16   [provider]  magic mouthwash w/lidocaine SOLN Take 5 mLs by mouth 3 (three) times daily as needed for mouth pain. 08/06/18   Enid Derry, PA-C  oxyCODONE-acetaminophen (PERCOCET) 10-325 MG tablet TK 1 T PO FID PRF PAIN 05/04/18   [provider]  testosterone cypionate (DEPOTESTOSTERONE CYPIONATE) 200 MG/ML injection Inject 1 mL (200 mg total) into the muscle every 14 (fourteen) days. 06/22/18   Stoioff, Verna Czech, MD    Allergies No known allergies  Family History  Problem Relation Age of Onset  . Headache Neg Hx     Social History Social History   Tobacco Use  . Smoking status: Never Smoker  . Smokeless tobacco: Never Used  Substance Use Topics  . Alcohol use: No    Comment: Socially in the past  . Drug use: No     Review of Systems  Constitutional: No fever/chills Cardiovascular: No chest pain. Respiratory: No SOB. Gastrointestinal: No nausea, no vomiting.  Musculoskeletal: Negative for musculoskeletal pain. Skin: Negative for  rash, abrasions, lacerations, ecchymosis.   ____________________________________________   PHYSICAL EXAM:  VITAL SIGNS: ED Triage Vitals  Enc Vitals Group     BP 08/06/18 2152 (!) 163/86     Pulse Rate 08/06/18 2152 71     Resp 08/06/18 2152 18     Temp 08/06/18 2152 98.1 F (36.7 C)     Temp Source 08/06/18 2152 Oral     SpO2 08/06/18 2152 98 %     Weight 08/06/18 2151 240 lb (108.9 kg)     Height 08/06/18 2151 5\' 11"  (1.803 m)     Head  Circumference --      Peak Flow --      Pain Score 08/06/18 2151 8     Pain Loc --      Pain Edu? --      Excl. in GC? --      Constitutional: Alert and oriented. Well appearing and in no acute distress. Eyes: Conjunctivae are normal. PERRL. EOMI. Head: Atraumatic. ENT:      Ears:      Nose: No congestion/rhinnorhea.      Mouth/Throat: Mucous membranes are moist.  Pinpoint ulcers to roof of mouth. Neck: No stridor.   Cardiovascular: Normal rate, regular rhythm.  Good peripheral circulation. Respiratory: Normal respiratory effort without tachypnea or retractions. Lungs CTAB. Good air entry to the bases with no decreased or absent breath sounds. Musculoskeletal: Full range of motion to all extremities. No gross deformities appreciated. Neurologic:  Normal speech and language. No gross focal neurologic deficits are appreciated.  Skin:  Skin is warm, dry and intact. No rash noted. Psychiatric: Mood and affect are normal. Speech and behavior are normal. Patient exhibits appropriate insight and judgement.   ____________________________________________   LABS (all labs ordered are listed, but only abnormal results are displayed)  Labs Reviewed - No data to display ____________________________________________  EKG   ____________________________________________  RADIOLOGY   No results found.  ____________________________________________    PROCEDURES  Procedure(s) performed:    Procedures    Medications  lidocaine (XYLOCAINE) 2 % viscous mouth solution 15 mL (15 mLs Mouth/Throat Given 08/06/18 2242)     ____________________________________________   INITIAL IMPRESSION / ASSESSMENT AND PLAN / ED COURSE  Pertinent labs & imaging results that were available during my care of the patient were reviewed by me and considered in my medical decision making (see chart for details).  Review of the [place] CSRS was performed in accordance of the [facility] prior to dispensing any  controlled drugs.     Patient's diagnosis is consistent with mouth ulcers.  Vital signs and exam are reassuring.  Patient is agreeable to follow-up with primary care and or dentistry for further evaluation and work-up of ulcers.  Patient will be discharged home with prescriptions for Magic mouthwash with lidocaine. Patient is to follow up with primary care and dentistry as directed. Patient is given ED precautions to return to the ED for any worsening or new symptoms.     ____________________________________________  FINAL CLINICAL IMPRESSION(S) / ED DIAGNOSES  Final diagnoses:  Mouth ulcers      NEW MEDICATIONS STARTED DURING THIS VISIT:  ED Discharge Orders         Ordered    magic mouthwash w/lidocaine SOLN  3 times daily PRN     08/06/18 2243              This chart was dictated using voice recognition software/Dragon. Despite best efforts to proofread, errors can occur which  can change the meaning. Any change was purely unintentional.    Enid DerryWagner, Aarushi Hemric, PA-C 08/06/18 2300    Willy Eddyobinson, Patrick, MD 08/06/18 2330

## 2018-08-06 NOTE — ED Triage Notes (Signed)
Patient states that he has ulcers in his mouth. Patient states that he has been seen here for the same in the past and that magic mouth wash helps.

## 2018-08-16 ENCOUNTER — Encounter: Payer: Self-pay | Admitting: Urology

## 2018-08-16 ENCOUNTER — Ambulatory Visit: Payer: Medicaid Other | Admitting: Urology

## 2018-08-17 ENCOUNTER — Ambulatory Visit (INDEPENDENT_AMBULATORY_CARE_PROVIDER_SITE_OTHER): Payer: Medicaid Other | Admitting: Urology

## 2018-08-17 DIAGNOSIS — E291 Testicular hypofunction: Secondary | ICD-10-CM

## 2018-08-17 MED ORDER — TESTOSTERONE CYPIONATE 200 MG/ML IM SOLN
200.0000 mg | Freq: Once | INTRAMUSCULAR | Status: AC
  Administered 2018-08-17: 200 mg via INTRAMUSCULAR

## 2018-08-17 NOTE — Progress Notes (Signed)
Testosterone IM Injection  Due to Hypogonadism patient is present today for a Testosterone Injection.  Medication: Testosterone Cypionate Dose: 1ml Location: right upper outer buttocks Lot: [id number] Exp:03/2020  Patient tolerated well, no complications were noted  Preformed by: Teressa Lowerarrie Ashford Clouse, CMA  Follow up: 2 weeks

## 2018-08-31 ENCOUNTER — Ambulatory Visit (INDEPENDENT_AMBULATORY_CARE_PROVIDER_SITE_OTHER): Payer: Medicaid Other

## 2018-08-31 DIAGNOSIS — E291 Testicular hypofunction: Secondary | ICD-10-CM

## 2018-08-31 MED ORDER — TESTOSTERONE CYPIONATE 200 MG/ML IM SOLN
200.0000 mg | Freq: Once | INTRAMUSCULAR | Status: AC
  Administered 2018-08-31: 200 mg via INTRAMUSCULAR

## 2018-08-31 NOTE — Progress Notes (Signed)
Testosterone IM Injection  Due to Hypogonadism patient is present today for a Testosterone Injection.  Medication: Testosterone Cypionate Dose: 1ml Location: left upper outer buttocks Lot: [id number]  Exp:03/2020  Patient tolerated well, no complications were noted  Preformed by: Eligha BridegroomSarah Watts, CMA  Follow up: 2 weeks

## 2018-09-14 ENCOUNTER — Ambulatory Visit: Payer: Medicaid Other

## 2018-09-14 ENCOUNTER — Encounter: Payer: Self-pay | Admitting: Urology

## 2018-09-20 ENCOUNTER — Ambulatory Visit (INDEPENDENT_AMBULATORY_CARE_PROVIDER_SITE_OTHER): Payer: Medicaid Other | Admitting: Family Medicine

## 2018-09-20 ENCOUNTER — Other Ambulatory Visit: Payer: Self-pay | Admitting: Family Medicine

## 2018-09-20 DIAGNOSIS — E291 Testicular hypofunction: Secondary | ICD-10-CM

## 2018-09-20 MED ORDER — SILDENAFIL CITRATE 20 MG PO TABS: ORAL_TABLET | ORAL | 1 refills | Status: DC

## 2018-09-20 MED ORDER — TESTOSTERONE CYPIONATE 200 MG/ML IM SOLN
200.0000 mg | Freq: Once | INTRAMUSCULAR | Status: AC
  Administered 2018-09-20: 200 mg via INTRAMUSCULAR

## 2018-09-20 NOTE — Progress Notes (Signed)
Testosterone IM Injection  Due to Hypogonadism patient is present today for a Testosterone Injection.  Medication: Testosterone Cypionate Dose: Location: right upper outer buttocks Lot: [id number] Exp:05/2020  Patient tolerated well, no complications were noted  Preformed by: Teressa Lower, CMA  Follow up: 2 weeks

## 2018-09-22 NOTE — Telephone Encounter (Signed)
error 

## 2018-09-26 ENCOUNTER — Other Ambulatory Visit: Payer: Self-pay

## 2018-09-26 DIAGNOSIS — E291 Testicular hypofunction: Secondary | ICD-10-CM

## 2018-09-27 ENCOUNTER — Other Ambulatory Visit: Payer: Medicaid Other

## 2018-09-27 DIAGNOSIS — E291 Testicular hypofunction: Secondary | ICD-10-CM

## 2018-09-28 LAB — LUTEINIZING HORMONE: LH: <0.2 m[IU]/mL — ABNORMAL LOW (ref 1.7–8.6)

## 2018-09-28 LAB — HEMATOCRIT: Hematocrit: 40.9 % (ref 37.5–51.0)

## 2018-09-28 LAB — HEMOGLOBIN: Hemoglobin: 14 g/dL (ref 13.0–17.7)

## 2018-09-28 LAB — TESTOSTERONE,FREE AND TOTAL
TESTOSTERONE: 1034 ng/dL — AB (ref 264–916)
Testosterone, Free: 16.2 pg/mL (ref 6.6–18.1)

## 2018-09-28 LAB — PSA: Prostate Specific Ag, Serum: 1 ng/mL (ref 0.0–4.0)

## 2018-10-04 ENCOUNTER — Other Ambulatory Visit: Payer: Self-pay

## 2018-10-04 ENCOUNTER — Other Ambulatory Visit: Payer: Medicaid Other

## 2018-10-04 DIAGNOSIS — E291 Testicular hypofunction: Secondary | ICD-10-CM

## 2018-10-05 LAB — TESTOSTERONE: Testosterone: 464 ng/dL (ref 264–916)

## 2018-10-08 ENCOUNTER — Emergency Department: Payer: Medicaid Other

## 2018-10-08 ENCOUNTER — Encounter: Payer: Self-pay | Admitting: Emergency Medicine

## 2018-10-08 ENCOUNTER — Emergency Department
Admission: EM | Admit: 2018-10-08 | Discharge: 2018-10-08 | Disposition: A | Discharge status: Home / Self Care | Payer: Medicaid Other | Attending: Emergency Medicine | Admitting: Emergency Medicine

## 2018-10-08 ENCOUNTER — Other Ambulatory Visit: Payer: Self-pay

## 2018-10-08 DIAGNOSIS — R21 Rash and other nonspecific skin eruption: Secondary | ICD-10-CM | POA: Diagnosis present

## 2018-10-08 DIAGNOSIS — Z96653 Presence of artificial knee joint, bilateral: Secondary | ICD-10-CM | POA: Insufficient documentation

## 2018-10-08 DIAGNOSIS — I10 Essential (primary) hypertension: Secondary | ICD-10-CM | POA: Insufficient documentation

## 2018-10-08 DIAGNOSIS — Z79899 Other long term (current) drug therapy: Secondary | ICD-10-CM | POA: Insufficient documentation

## 2018-10-08 DIAGNOSIS — L0100 Impetigo, unspecified: Secondary | ICD-10-CM | POA: Diagnosis not present

## 2018-10-08 DIAGNOSIS — M75102 Unspecified rotator cuff tear or rupture of left shoulder, not specified as traumatic: Secondary | ICD-10-CM | POA: Insufficient documentation

## 2018-10-08 DIAGNOSIS — K120 Recurrent oral aphthae: Secondary | ICD-10-CM | POA: Insufficient documentation

## 2018-10-08 DIAGNOSIS — M7582 Other shoulder lesions, left shoulder: Secondary | ICD-10-CM

## 2018-10-08 MED ORDER — CEPHALEXIN 500 MG PO CAPS
500.0000 mg | ORAL_CAPSULE | Freq: Once | ORAL | Status: AC
  Administered 2018-10-08: 500 mg via ORAL
  Filled 2018-10-08: qty 1

## 2018-10-08 MED ORDER — MELOXICAM 7.5 MG PO TABS
15.0000 mg | ORAL_TABLET | Freq: Every day | ORAL | Status: DC
  Administered 2018-10-08: 15 mg via ORAL
  Filled 2018-10-08: qty 2

## 2018-10-08 MED ORDER — MAGIC MOUTHWASH W/LIDOCAINE: 10.0000 mL | Freq: Three times a day (TID) | ORAL | 0 refills | Status: DC

## 2018-10-08 MED ORDER — CEPHALEXIN 500 MG PO CAPS: 500.0000 mg | ORAL_CAPSULE | Freq: Three times a day (TID) | ORAL | 0 refills | Status: AC

## 2018-10-08 MED ORDER — MAGIC MOUTHWASH
15.0000 mL | Freq: Once | ORAL | Status: AC
  Administered 2018-10-08: 15 mL via ORAL
  Filled 2018-10-08: qty 20

## 2018-10-08 MED ORDER — MELOXICAM 15 MG PO TABS: 15.0000 mg | ORAL_TABLET | Freq: Every day | ORAL | 1 refills | Status: AC

## 2018-10-08 NOTE — ED Notes (Signed)
Pt wants to wait on medication from pharmacy before being discharged. Pt okay with the wait.

## 2018-10-08 NOTE — ED Triage Notes (Signed)
Pt to ED via POV stating that he has a rash around his mouth. Pt states that he thinks that the clippers his barber used are breaking him out. Pt also c/o left shoulder pain x 2 months. Pt is in NAD.

## 2018-10-08 NOTE — ED Provider Notes (Signed)
[facility] Emergency Department Provider Note  ____________________________________________  Time seen: Approximately 7:42 PM  I have reviewed the triage vital signs and the nursing notes.   HISTORY  Chief Complaint Rash and Shoulder Pain    HPI Walter Hodges is a 63 y.o. male presents to the emergency department with multiple medical complaints.  Patient has a history of aphthous stomatitis from his dentures and is requesting Magic mouthwash, a "large container".  Is also developed a rash with honey colored crust over skin underlying mustache.  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  Patient has concerns regarding sanitation.  He has been using Lotrimin cream which does not seem to be relieving his symptoms.  Third, patient has 7 out of 10 semi-chronic left shoulder pain that is occurred over the past 2 months.  Shoulder pain is worse at night and feels like an aching pain and is worsened with abduction at the shoulder.  No numbness or tingling of the left upper extremity.  No subjective weakness.  No falls or traumas.  No fever or chills or redness of the left shoulder joint, to patient's knowledge.    Past Medical History:  Diagnosis Date  . Arthritis   . Bell's palsy 2010   lasted one day ago, told that he had a ministroke  . Headache   . Hollenhorst plaque, right eye   . Hypertension    followed by Dr. Nita Sells" with [facility]      Patient Active Problem List   Diagnosis Date Noted  . Acute pain of right shoulder 07/05/2016  . 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 Chronic pain of both knees 03/19/2016  . Embolism involving retinal artery   . Insomnia 02/16/2016  . Hollenhorst plaque, right eye 02/16/2016  . Broken tooth, complicated 01/23/2016  . Hypertension 01/07/2016  . Total knee replacement status 01/01/2016  . Primary osteoarthritis of  right knee 05/26/2015    Past Surgical History:  Procedure Laterality Date  . CIRCUMCISION     done as a child- given inhalation   . JOINT REPLACEMENT Bilateral   . KNEE CLOSED REDUCTION Left 03/19/2016   Procedure: CLOSED MANIPULATION LEFT KNEE;  Surgeon: Tarry Kos, MD;  Location: [facility] OR;  Service: Orthopedics;  Laterality: Left;  . TEE WITHOUT CARDIOVERSION N/A 02/19/2016   Procedure: TRANSESOPHAGEAL ECHOCARDIOGRAM (TEE);  Surgeon: Thurmon Fair, MD;  Location: [facility] ENDOSCOPY;  Service: Cardiovascular;  Laterality: N/A;  . TOTAL KNEE ARTHROPLASTY Right 05/26/2015   Procedure: RIGHT TOTAL KNEE ARTHROPLASTY;  Surgeon: Tarry Kos, MD;  Location: [facility] OR;  Service: Orthopedics;  Laterality: Right;  . TOTAL KNEE ARTHROPLASTY Left 01/01/2016   Procedure: LEFT TOTAL KNEE ARTHROPLASTY;  Surgeon: Tarry Kos, MD;  Location: [facility] OR;  Service: Orthopedics;  Laterality: Left;    Prior to Admission medications   Medication Sig Start Date End Date Taking? Authorizing Provider  amLODipine (NORVASC) 10 MG tablet  12/07/16   [provider]  cephALEXin (KEFLEX) 500 MG capsule Take 1 capsule (500 mg total) by mouth 3 (three) times daily for 7 days. 10/08/18 10/15/18  Orvil Feil, PA-C  magic mouthwash w/lidocaine SOLN Take 10 mLs by mouth 3 (three) times daily. 10/08/18   Orvil Feil, PA-C  meloxicam (MOBIC) 15 MG tablet Take 1 tablet (15 mg total) by mouth daily for 7 days. 10/08/18 10/15/18  Orvil Feil, PA-C  oxyCODONE-acetaminophen (PERCOCET)  10-325 MG tablet TK 1 T PO FID PRF PAIN 05/04/18   [provider]  sildenafil (REVATIO) 20 MG tablet Take 1-5 tablets as needed 30 minutes prior to sexual activity. 09/20/18   Stoioff, Verna Czech, MD  testosterone cypionate (DEPOTESTOSTERONE CYPIONATE) 200 MG/ML injection Inject 1 mL (200 mg total) into the muscle every 14 (fourteen) days. 06/22/18   Stoioff, Verna Czech, MD    Allergies No known allergies  Family History  Problem Relation Age of  Onset  . Headache Neg Hx     Social History Social History   Tobacco Use  . Smoking status: Never Smoker  . Smokeless tobacco: Never Used  Substance Use Topics  . Alcohol use: No    Comment: Socially in the past  . Drug use: No     Review of Systems  Constitutional: No fever/chills Eyes: No visual changes. No discharge ENT: No upper respiratory complaints. Cardiovascular: no chest pain. Respiratory: no cough. No SOB. Gastrointestinal: No abdominal pain.  No nausea, no vomiting.  No diarrhea.  No constipation. Genitourinary: Negative for dysuria. No hematuria Musculoskeletal: Patient has left shoulder pain.  Skin: Patient has rash.  Neurological: Negative for headaches, focal weakness or numbness.   ____________________________________________   PHYSICAL EXAM:  VITAL SIGNS: ED Triage Vitals [10/08/18 1747]  Enc Vitals Group     BP (!) 141/106     Pulse Rate 79     Resp 16     Temp 98.1 F (36.7 C)     Temp Source Oral     SpO2 98 %     Weight      Height      Head Circumference      Peak Flow      Pain Score 8     Pain Loc      Pain Edu?      Excl. in GC?      Constitutional: Alert and oriented. Well appearing and in no acute distress. Eyes: Conjunctivae are normal. PERRL. EOMI. Head: Atraumatic. ENT:      Ears: TMs are pearly.      Nose: No congestion/rhinnorhea.      Mouth/Throat: Mucous membranes are moist.  Patient has aphthous stomatitis of mouth. Neck: No stridor.  No cervical spine tenderness to palpation. Cardiovascular: Normal rate, regular rhythm. Normal S1 and S2.  Good peripheral circulation. Respiratory: Normal respiratory effort without tachypnea or retractions. Lungs CTAB. Good air entry to the bases with no decreased or absent breath sounds. Musculoskeletal: Full range of motion to all extremities. No gross deformities appreciated.  Patient has pain but no weakness with left rotator cuff testing.  Full range of motion is demonstrated at  left shoulder.  Palpable radial pulse, left. Neurologic:  Normal speech and language. No gross focal neurologic deficits are appreciated.  Skin: Patient has erythematous, maculopapular rash of upper lip with overlying honey colored crusts. Psychiatric: Mood and affect are normal. Speech and behavior are normal. Patient exhibits appropriate insight and judgement.   ____________________________________________   LABS (all labs ordered are listed, but only abnormal results are displayed)  Labs Reviewed - No data to display ____________________________________________  EKG   ____________________________________________  RADIOLOGY I personally viewed and evaluated these images as part of my medical decision making, as well as reviewing the written report by the radiologist.  Dg Shoulder Left  Result Date: 10/08/2018 CLINICAL DATA:  Left shoulder pain. EXAM: LEFT SHOULDER - 2+ VIEW COMPARISON:  None. FINDINGS: There is no evidence of  fracture or dislocation. There is no evidence of arthropathy or other focal bone abnormality. Soft tissues are unremarkable. IMPRESSION: Negative. Electronically Signed   By: Kennith CenterEric  Mansell M.D.   On: 10/08/2018 18:46    ____________________________________________    PROCEDURES  Procedure(s) performed:    Procedures    Medications  meloxicam (MOBIC) tablet 15 mg (15 mg Oral Given 10/08/18 1919)  magic mouthwash (has no administration in time range)  cephALEXin (KEFLEX) capsule 500 mg (500 mg Oral Given 10/08/18 1920)     ____________________________________________   INITIAL IMPRESSION / ASSESSMENT AND PLAN / ED COURSE  Pertinent labs & imaging results that were available during my care of the patient were reviewed by me and considered in my medical decision making (see chart for details).  Review of the [place] CSRS was performed in accordance of the [facility] prior to dispensing any controlled drugs.      Assessment and plan Rotator cuff  tendinitis Impetigo Aphthous stomatitis Patient presents to the emergency department with multiple medical complaints.  After stomatitis was treated with Magic mouthwash.  Impetigo was treated with Keflex.  Rotator cuff tendinitis was treated with meloxicam.  No acute abnormalities were identified on x-ray examination of the left shoulder.  He was advised to follow-up with primary care as needed.  All patient questions were answered.     ____________________________________________  FINAL CLINICAL IMPRESSION(S) / ED DIAGNOSES  Final diagnoses:  Impetigo  Aphthous stomatitis  Tendinitis of left rotator cuff      NEW MEDICATIONS STARTED DURING THIS VISIT:  ED Discharge Orders         Ordered    cephALEXin (KEFLEX) 500 MG capsule  3 times daily     10/08/18 1919    meloxicam (MOBIC) 15 MG tablet  Daily     10/08/18 1919    magic mouthwash w/lidocaine SOLN  3 times daily     10/08/18 1919              This chart was dictated using voice recognition software/Dragon. Despite best efforts to proofread, errors can occur which can change the meaning. Any change was purely unintentional.    Orvil FeilWoods, Mohid Furuya M, PA-C 10/08/18 1948    Jeanmarie PlantMcShane, James A, MD 10/09/18 [phone]

## 2018-10-17 ENCOUNTER — Telehealth: Payer: Self-pay | Admitting: Family Medicine

## 2018-10-17 NOTE — Telephone Encounter (Signed)
-----   Message from Riki Altes, MD sent at 10/15/2018 12:35 PM EST ----- Testosterone level was 464.  He is due for a follow-up visit for testosterone monitoring, DRE.  Please schedule follow-up with Carollee Herter sometime March or April 2020

## 2018-10-17 NOTE — Telephone Encounter (Signed)
Patient notified and Appointment has been scheduled.

## 2018-10-18 ENCOUNTER — Telehealth: Payer: Self-pay | Admitting: Urology

## 2018-10-18 NOTE — Telephone Encounter (Signed)
Pt called and states that he is calling back to talk to Merrill, I notified him of his upcoming appt and he states that he needs to talk to her about a shot. Please advise.

## 2018-10-18 NOTE — Telephone Encounter (Signed)
Spoke to patient and he wil come in for his testosterone shot.

## 2018-10-20 ENCOUNTER — Ambulatory Visit (INDEPENDENT_AMBULATORY_CARE_PROVIDER_SITE_OTHER): Payer: Medicaid Other | Admitting: Family Medicine

## 2018-10-20 DIAGNOSIS — E291 Testicular hypofunction: Secondary | ICD-10-CM | POA: Diagnosis not present

## 2018-10-20 MED ORDER — TESTOSTERONE CYPIONATE 200 MG/ML IM SOLN
200.0000 mg | Freq: Once | INTRAMUSCULAR | Status: AC
  Administered 2018-10-20: 200 mg via INTRAMUSCULAR

## 2018-10-20 NOTE — Progress Notes (Signed)
Testosterone IM Injection  Due to Hypogonadism patient is present today for a Testosterone Injection.  Medication: Testosterone Cypionate Dose: Location: right upper outer buttocks Lot: [id number] Exp:05/2020  Patient tolerated well, no complications were noted  Preformed by: Teressa Lower, CMA  Follow up: 2 weeks

## 2018-10-22 ENCOUNTER — Other Ambulatory Visit: Payer: Self-pay

## 2018-10-22 ENCOUNTER — Emergency Department
Admission: EM | Admit: 2018-10-22 | Discharge: 2018-10-22 | Disposition: A | Discharge status: Home / Self Care | Payer: Medicaid Other | Attending: Student in an Organized Health Care Education/Training Program | Admitting: Student in an Organized Health Care Education/Training Program

## 2018-10-22 DIAGNOSIS — R21 Rash and other nonspecific skin eruption: Secondary | ICD-10-CM | POA: Diagnosis present

## 2018-10-22 DIAGNOSIS — Z79899 Other long term (current) drug therapy: Secondary | ICD-10-CM | POA: Insufficient documentation

## 2018-10-22 DIAGNOSIS — Z96653 Presence of artificial knee joint, bilateral: Secondary | ICD-10-CM | POA: Diagnosis not present

## 2018-10-22 DIAGNOSIS — I10 Essential (primary) hypertension: Secondary | ICD-10-CM | POA: Insufficient documentation

## 2018-10-22 DIAGNOSIS — L738 Other specified follicular disorders: Secondary | ICD-10-CM | POA: Insufficient documentation

## 2018-10-22 MED ORDER — FLUCONAZOLE 50 MG PO TABS
150.0000 mg | ORAL_TABLET | Freq: Once | ORAL | Status: AC
  Administered 2018-10-22: 150 mg via ORAL
  Filled 2018-10-22: qty 1

## 2018-10-22 MED ORDER — SULFAMETHOXAZOLE-TRIMETHOPRIM 800-160 MG PO TABS: 1.0000 | ORAL_TABLET | Freq: Two times a day (BID) | ORAL | 0 refills | Status: AC

## 2018-10-22 MED ORDER — FLUCONAZOLE 150 MG PO TABS: 150.0000 mg | ORAL_TABLET | Freq: Every day | ORAL | 0 refills | Status: AC

## 2018-10-22 MED ORDER — KETOCONAZOLE 2 % EX CREA: 1.0000 "application " | TOPICAL_CREAM | Freq: Every day | CUTANEOUS | 0 refills | Status: DC

## 2018-10-22 MED ORDER — SULFAMETHOXAZOLE-TRIMETHOPRIM 800-160 MG PO TABS
1.0000 | ORAL_TABLET | Freq: Once | ORAL | Status: AC
  Administered 2018-10-22: 1 via ORAL
  Filled 2018-10-22: qty 1

## 2018-10-22 NOTE — ED Triage Notes (Addendum)
Pt reports that he was seen last week for a "infection" in his moustache - he was given cephalexin and reports it has not helped

## 2018-10-22 NOTE — ED Provider Notes (Signed)
[facility] Emergency Department Provider Note  ____________________________________________  Time seen: Approximately 5:59 PM  I have reviewed the triage vital signs and the nursing notes.   HISTORY  Chief Complaint Rash    HPI Walter Hodges is a 63 y.o. male presents to the emergency department with a reoccurring rash along the mustache area.  Patient was seen on 10/08/2018 and was diagnosed with impetigo and was treated with Keflex.  Patient reports that rash initially improved but reoccurred after patient used hair dye along the mustache area.  Patient reports that he has had some swelling along the upper lip and rash has been expressing a yellow, clear exudate.  He has been using coconut oil on affected area.  Patient is convinced that rash is coming from a traveling barber that came to his house several weeks ago.  He has been using the same type of mustache die for several years and is certain that he is not having a contact dermatitis due to prior consistent use.  No other alleviating measures have been attempted.   Past Medical History:  Diagnosis Date  . Arthritis   . Bell's palsy 2010   lasted one day ago, told that he had a ministroke  . Headache   . Hollenhorst plaque, right eye   . Hypertension    followed by Dr. Nita Sells"maryann" with [facility]      Patient Active Problem List   Diagnosis Date Noted  . Acute pain of right shoulder 07/05/2016  . 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 Chronic pain of both knees 03/19/2016  . Embolism involving retinal artery   . Insomnia 02/16/2016  . Hollenhorst plaque, right eye 02/16/2016  . Broken tooth, complicated 01/23/2016  . Hypertension 01/07/2016  . Total knee replacement status 01/01/2016  . Primary osteoarthritis of right knee 05/26/2015    Past Surgical History:  Procedure Laterality Date  . CIRCUMCISION     done as a child- given inhalation   .  JOINT REPLACEMENT Bilateral   . KNEE CLOSED REDUCTION Left 03/19/2016   Procedure: CLOSED MANIPULATION LEFT KNEE;  Surgeon: Tarry KosNaiping M Xu, MD;  Location: [facility] OR;  Service: Orthopedics;  Laterality: Left;  . TEE WITHOUT CARDIOVERSION N/A 02/19/2016   Procedure: TRANSESOPHAGEAL ECHOCARDIOGRAM (TEE);  Surgeon: Thurmon FairMihai Croitoru, MD;  [facility];  Service: Cardiovascular;  Laterality: N/A;  . TOTAL KNEE ARTHROPLASTY Right 05/26/2015   Procedure: RIGHT TOTAL KNEE ARTHROPLASTY;  Surgeon: Tarry KosNaiping M Xu, MD;  Location: [facility] OR;  Service: Orthopedics;  Laterality: Right;  . TOTAL KNEE ARTHROPLASTY Left 01/01/2016   Procedure: LEFT TOTAL KNEE ARTHROPLASTY;  Surgeon: Tarry KosNaiping M Xu, MD;  Location: [facility] OR;  Service: Orthopedics;  Laterality: Left;    Prior to Admission medications   Medication Sig Start Date End Date Taking? Authorizing Provider  amLODipine (NORVASC) 10 MG tablet  12/07/16   [provider]  fluconazole (DIFLUCAN) 150 MG tablet Take 1 tablet (150 mg total) by mouth daily for 30 days. 10/22/18 11/21/18  Orvil FeilWoods, Merelin Human M, PA-C  ketoconazole (NIZORAL) 2 % cream Apply 1 application topically daily. 10/22/18   Orvil FeilWoods, Ledford Goodson M, PA-C  magic mouthwash w/lidocaine SOLN Take 10 mLs by mouth 3 (three) times daily. 10/08/18   Orvil FeilWoods, Cadan Maggart M, PA-C  oxyCODONE-acetaminophen (PERCOCET) 10-325 MG tablet TK 1 T PO FID PRF PAIN 05/04/18   [provider]  sildenafil (REVATIO) 20 MG tablet Take 1-5 tablets as needed 30 minutes prior to  sexual activity. 09/20/18   Stoioff, Verna Czech, MD  sulfamethoxazole-trimethoprim (BACTRIM DS,SEPTRA DS) 800-160 MG tablet Take 1 tablet by mouth 2 (two) times daily for 7 days. 10/22/18 10/29/18  Orvil Feil, PA-C  testosterone cypionate (DEPOTESTOSTERONE CYPIONATE) 200 MG/ML injection Inject 1 mL (200 mg total) into the muscle every 14 (fourteen) days. 06/22/18   Stoioff, Verna Czech, MD    Allergies No known allergies  Family History  Problem Relation Age of Onset  .  Headache Neg Hx     Social History Social History   Tobacco Use  . Smoking status: Never Smoker  . Smokeless tobacco: Never Used  Substance Use Topics  . Alcohol use: No    Comment: Socially in the past  . Drug use: No     Review of Systems  Constitutional: No fever/chills Eyes: No visual changes. No discharge ENT: No upper respiratory complaints. Cardiovascular: no chest pain. Respiratory: no cough. No SOB. Gastrointestinal: No abdominal pain.  No nausea, no vomiting.  No diarrhea.  No constipation. Musculoskeletal: Negative for musculoskeletal pain. Skin: Patient has rash.  Neurological: Negative for headaches, focal weakness or numbness.   ____________________________________________   PHYSICAL EXAM:  VITAL SIGNS: ED Triage Vitals  Enc Vitals Group     BP 10/22/18 1705 (!) 169/86     Pulse Rate 10/22/18 1705 86     Resp 10/22/18 1705 17     Temp 10/22/18 1705 98.3 F (36.8 C)     Temp Source 10/22/18 1705 Oral     SpO2 10/22/18 1705 98 %     Weight 10/22/18 1704 245 lb (111.1 kg)     Height 10/22/18 1704 5\' 10"  (1.778 m)     Head Circumference --      Peak Flow --      Pain Score 10/22/18 1704 7     Pain Loc --      Pain Edu? --      Excl. in GC? --      Constitutional: Alert and oriented. Well appearing and in no acute distress. Eyes: Conjunctivae are normal. PERRL. EOMI. Head: Atraumatic. Cardiovascular: Normal rate, regular rhythm. Normal S1 and S2.  Good peripheral circulation. Respiratory: Normal respiratory effort without tachypnea or retractions. Lungs CTAB. Good air entry to the bases with no decreased or absent breath sounds. Musculoskeletal: Full range of motion to all extremities. No gross deformities appreciated. Neurologic:  Normal speech and language. No gross focal neurologic deficits are appreciated.  Skin: Patient has an erythematous, maculopapular rash along the distribution of the mustache with clinically crust and yellow,  serosanguineous exudate. Psychiatric: Mood and affect are normal. Speech and behavior are normal. Patient exhibits appropriate insight and judgement.   ____________________________________________   LABS (all labs ordered are listed, but only abnormal results are displayed)  Labs Reviewed - No data to display ____________________________________________  EKG   ____________________________________________  RADIOLOGY   No results found.  ____________________________________________    PROCEDURES  Procedure(s) performed:    Procedures    Medications  sulfamethoxazole-trimethoprim (BACTRIM DS,SEPTRA DS) 800-160 MG per tablet 1 tablet (1 tablet Oral Given 10/22/18 1748)  fluconazole (DIFLUCAN) tablet 150 mg (150 mg Oral Given 10/22/18 1748)     ____________________________________________   INITIAL IMPRESSION / ASSESSMENT AND PLAN / ED COURSE  Pertinent labs & imaging results that were available during my care of the patient were reviewed by me and considered in my medical decision making (see chart for details).  Review of the [place] CSRS was  performed in accordance of the [facility] prior to dispensing any controlled drugs.      Assessment and plan Folliculitis Contact dermatitis Patient presents to the emergency department with a rash that has reoccurred since using mustache dye.  Patient education regarding contact dermatitis was given.  I explained to patient that contact dermatitis can occur even after years of consistent use with a particular product.  Patient is convinced that the source of his rash is something other than the mustache dye.  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  I told patient that he could have a fungal component to his folliculitis and patient was treated with ketoconazole cream and fluconazole.  Patient was adamant that he receive an antibiotic for his rash given improvement with Keflex.  Patient was  started on Bactrim.  Advised abstaining from mustache dye over the next month to see if symptoms improve.  Patient was advised to follow-up with primary care if symptoms do not improve.  All patient questions were answered.     ____________________________________________  FINAL CLINICAL IMPRESSION(S) / ED DIAGNOSES  Final diagnoses:  Folliculitis barbae      NEW MEDICATIONS STARTED DURING THIS VISIT:  ED Discharge Orders         Ordered    fluconazole (DIFLUCAN) 150 MG tablet  Daily     10/22/18 1744    ketoconazole (NIZORAL) 2 % cream  Daily     10/22/18 1744    sulfamethoxazole-trimethoprim (BACTRIM DS,SEPTRA DS) 800-160 MG tablet  2 times daily     10/22/18 1745              This chart was dictated using voice recognition software/Dragon. Despite best efforts to proofread, errors can occur which can change the meaning. Any change was purely unintentional.    Orvil Feil, PA-C 10/22/18 1806    Willy Eddy, MD 10/22/18 Nicholos Johns

## 2018-11-15 ENCOUNTER — Encounter: Payer: Self-pay | Admitting: Urology

## 2018-11-15 ENCOUNTER — Ambulatory Visit: Payer: Self-pay | Admitting: Urology

## 2018-11-15 ENCOUNTER — Other Ambulatory Visit: Payer: Self-pay

## 2018-11-15 ENCOUNTER — Ambulatory Visit (INDEPENDENT_AMBULATORY_CARE_PROVIDER_SITE_OTHER): Payer: Medicaid Other | Admitting: Urology

## 2018-11-15 VITALS — BP 132/82 | HR 72 | Ht 70.0 in | Wt 250.0 lb

## 2018-11-15 DIAGNOSIS — R351 Nocturia: Secondary | ICD-10-CM

## 2018-11-15 DIAGNOSIS — E291 Testicular hypofunction: Secondary | ICD-10-CM | POA: Diagnosis not present

## 2018-11-15 DIAGNOSIS — N138 Other obstructive and reflux uropathy: Secondary | ICD-10-CM

## 2018-11-15 DIAGNOSIS — E349 Endocrine disorder, unspecified: Secondary | ICD-10-CM

## 2018-11-15 DIAGNOSIS — N401 Enlarged prostate with lower urinary tract symptoms: Secondary | ICD-10-CM

## 2018-11-15 DIAGNOSIS — N5201 Erectile dysfunction due to arterial insufficiency: Secondary | ICD-10-CM | POA: Diagnosis not present

## 2018-11-15 LAB — BLADDER SCAN AMB NON-IMAGING: Scan Result: 0

## 2018-11-15 MED ORDER — NONFORMULARY OR COMPOUNDED ITEM: 3 refills | Status: DC

## 2018-11-15 MED ORDER — TESTOSTERONE CYPIONATE 200 MG/ML IM SOLN
200.0000 mg | Freq: Once | INTRAMUSCULAR | Status: AC
  Administered 2018-11-15: 200 mg via INTRAMUSCULAR

## 2018-11-15 NOTE — Progress Notes (Signed)
11/15/2018 1:44 PM   Walter Hodges 1956-02-15 [id number]  Referring provider: Charlott Rakes, MD [address] [place], [place] [address]  Chief Complaint  Patient presents with  . Hydronephrosis    HPI: Walter Hodges is a 63 y.o. male African-American with testosterone deficiency and erectile dysfunction who presents today for a follow up of symptoms.  Background history 63 year-old male previously seen for hypogonadism and erectile dysfunction.  He started TRT in May 2019.  His energy level has improved however he still complains of the ED.  He has a friend who is doing intracavernosal injections and wanted to discuss this option for ED.  Patient denies any history of sickle cell disease, multiple myeloma, leukemia or Peyronie's disease.  He states that he has taken Viagra in the past and only achieved a semi-erection.  Most recent testosterone is 464 ng/dL on 10/04/2018.  HCT and hbg were normal on 09/27/2018.   He presented to the office on 06/01/2018 for his initial Trimix titration.  He achieved a semi-rigid erection with 6 mcg of Trimix.    On 06/07/2018, patient denied any gross hematuria, dysuria or suprapubic/flank pain.  Patient denied any fevers, chills, nausea or vomiting.   BPH WITH LUTS  (prostate and/or bladder) IPSS score: 8/5   PVR: 0 mL  Major complaint(s): Nocturia x 5 that started recently.  Denies any dysuria, hematuria or suprapubic pain.   Today (11/15/2018) he reports that his frequency has waxed and waned.  Patient denies having sleep apnea, "as far as I know."   Denies any recent fevers, chills, nausea or vomiting.  He does not have a family history of PCa.  IPSS    Row Name 11/15/18 0900         International Prostate Symptom Score   How often have you had the sensation of not emptying your bladder?  Not at All     How often have you had to urinate less than every two hours?  About half the time     How often have you found you stopped  and started again several times when you urinated?  Not at All     How often have you found it difficult to postpone urination?  Not at All     How often have you had a weak urinary stream?  Less than half the time     How often have you had to strain to start urination?  Not at All     How many times did you typically get up at night to urinate?  3 Times     Total IPSS Score  8       Quality of Life due to urinary symptoms   If you were to spend the rest of your life with your urinary condition just the way it is now how would you feel about that?  Unhappy        Score:  1-7 Mild 8-19 Moderate 20-35 Severe  Erectile dysfunction His SHIM score is 5, which is severe ED.   He has been having difficulty with erections for twenty years.   His major complaint is no spontaneous erections.  His libido is preserved.   His risk factors for ED are age, testosterone deficiency, and HTN and HLD.   He denies any painful erections or curvatures with his erections.   He is no longer having spontaneous erections.  He has tried Viagra and Trimix in the past.  He has had  semi-rigid erections with the Trimix at 6 mcg.  He has not titrated at home.   SHIM    Row Name 11/15/18 0944         SHIM: Over the last 6 months:   How do you rate your confidence that you could get and keep an erection?  Very Low     When you had erections with sexual stimulation, how often were your erections hard enough for penetration (entering your partner)?  Almost Never or Never     During sexual intercourse, how often were you able to maintain your erection after you had penetrated (entered) your partner?  Almost Never or Never     During sexual intercourse, how difficult was it to maintain your erection to completion of intercourse?  Extremely Difficult     When you attempted sexual intercourse, how often was it satisfactory for you?  Almost Never or Never       SHIM Total Score   SHIM  5       Score: 1-7 Severe ED 8-11  Moderate ED 12-16 Mild-Moderate ED 17-21 Mild ED 22-25 No ED  PMH: Past Medical History:  Diagnosis Date  . Arthritis   . Bell's palsy 2010   lasted one day ago, told that he had a ministroke  . Headache   . Hollenhorst plaque, right eye   . Hypertension    followed by Dr. Velta Addison" with [facility]      Surgical History: Past Surgical History:  Procedure Laterality Date  . CIRCUMCISION     done as a child- given inhalation   . JOINT REPLACEMENT Bilateral   . KNEE CLOSED REDUCTION Left 03/19/2016   Procedure: CLOSED MANIPULATION LEFT KNEE;  Surgeon: Leandrew Koyanagi, MD;  Location: [facility];  Service: Orthopedics;  Laterality: Left;  . TEE WITHOUT CARDIOVERSION N/A 02/19/2016   Procedure: TRANSESOPHAGEAL ECHOCARDIOGRAM (TEE);  Surgeon: Sanda Klein, MD;  Location: [facility];  Service: Cardiovascular;  Laterality: N/A;  . TOTAL KNEE ARTHROPLASTY Right 05/26/2015   Procedure: RIGHT TOTAL KNEE ARTHROPLASTY;  Surgeon: Leandrew Koyanagi, MD;  Location: [facility];  Service: Orthopedics;  Laterality: Right;  . TOTAL KNEE ARTHROPLASTY Left 01/01/2016   Procedure: LEFT TOTAL KNEE ARTHROPLASTY;  Surgeon: Leandrew Koyanagi, MD;  Location: [facility];  Service: Orthopedics;  Laterality: Left;    Home Medications:  Allergies as of 11/15/2018      Reactions   No Known Allergies       Medication List       Accurate as of November 15, 2018  1:44 PM. Always use your most recent med list.        amLODipine 10 MG tablet Commonly known as:  NORVASC   fluconazole 150 MG tablet Commonly known as:  DIFLUCAN Take 1 tablet (150 mg total) by mouth daily for 30 days.   ketoconazole 2 % cream Commonly known as:  NIZORAL Apply 1 application topically daily.   magic mouthwash w/lidocaine Soln Take 10 mLs by mouth 3 (three) times daily.   NONFORMULARY OR COMPOUNDED ITEM Trimix (30/1/50)-(Pap/Phent/PGE) Dosage: Inject 7 cc per injection 3cc size vial Qty1 Refills 3 [facility] [phone] Fax  [phone]   oxyCODONE-acetaminophen 10-325 MG tablet Commonly known as:  PERCOCET TK 1 T PO FID PRF PAIN   sildenafil 20 MG tablet Commonly known as:  REVATIO Take 1-5 tablets as needed 30 minutes prior to sexual activity.   testosterone cypionate 200 MG/ML injection Commonly known as:  DEPOTESTOSTERONE CYPIONATE Inject  1 mL (200 mg total) into the muscle every 14 (fourteen) days.       Allergies:  Allergies  Allergen Reactions  . No Known Allergies     Family History: Family History  Problem Relation Age of Onset  . Headache Neg Hx     Social History:  reports that he has never smoked. He has never used smokeless tobacco. He reports that he does not drink alcohol or use drugs.  ROS: UROLOGY Frequent Urination?: Yes Hard to postpone urination?: No Burning/pain with urination?: No Get up at night to urinate?: Yes Leakage of urine?: No Urine stream starts and stops?: No Trouble starting stream?: No Do you have to strain to urinate?: No Blood in urine?: No Urinary tract infection?: No Sexually transmitted disease?: No Injury to kidneys or bladder?: No Painful intercourse?: No Weak stream?: No Erection problems?: Yes Penile pain?: No  Gastrointestinal Nausea?: No Vomiting?: No Indigestion/heartburn?: No Diarrhea?: No Constipation?: No  Constitutional Fever: No Night sweats?: No Weight loss?: No Fatigue?: No  Skin Skin rash/lesions?: No Itching?: No  Eyes Blurred vision?: No Double vision?: No  Ears/Nose/Throat Sore throat?: No Sinus problems?: No  Hematologic/Lymphatic Swollen glands?: No Easy bruising?: No  Cardiovascular Leg swelling?: No Chest pain?: No  Respiratory Cough?: No Shortness of breath?: No  Endocrine Excessive thirst?: No  Musculoskeletal Back pain?: No Joint pain?: No  Neurological Headaches?: No Dizziness?: No  Psychologic Depression?: No Anxiety?: No  Physical Exam: BP 132/82   Pulse 72   Ht 5'  10" (1.778 m)   Wt 250 lb (113.4 kg)   BMI 35.87 kg/m   Constitutional:  Well nourished. Alert and oriented, No acute distress. HEENT: [place] AT, moist mucus membranes.  Trachea midline. Cardiovascular: No clubbing, cyanosis, or edema. Respiratory: Normal respiratory effort, no increased work of breathing. GU: No CVA tenderness.  No bladder fullness or masses.  Patient with circumcised phallus.  Urethral meatus is patent.  No penile discharge. No penile lesions or rashes. Scrotum without lesions, cysts, rashes and/or edema.  Testicles are located scrotally bilaterally. No masses are appreciated in the testicles. Left and right epididymis are normal. Rectal: Patient with normal sphincter tone.  Anus and perineum without scarring or rashes.  No rectal masses are appreciated. Prostate is approximately 60+ grams, only the apex and midportion could be palpated, no nodules are appreciated.  Seminal vesicles could not be palpated. Skin: No rashes, bruises or suspicious lesions. Lymph: No inguinal adenopathy. Neurologic: Grossly intact, no focal deficits, moving all 4 extremities. Psychiatric: Normal mood and affect.  Laboratory Data:  Lab Results  Component Value Date   TESTOSTERONE 464 10/04/2018   PSA Trend  3.1 ng/mL on 12/26/2017  1.0 ng/mL on 09/27/2018  I have reviewed the labs.  Pertinent Imaging:  Results for orders placed or performed in visit on 11/15/18  Bladder Scan (Post Void Residual) in office  Result Value Ref Range   Scan Result 0    I have independently reviewed the films.  Assessment & Plan:    1. Testosterone deficiency  - most recent testosterone level is 464 ng/dL on 10/04/2018 (goal 450-600 ng/dL) - reduce testosterone cypionate 200 mg/IM to 0.5 cc every two weeks - RTC in 4 weeks one week after a testosterone injection for testosterone level  2. BPH with LUTS - IPSS score is 8/5 - Continue conservative management, avoiding bladder irritants and timed  voiding's - Most bothersome symptom is nocturia - RTC in 6 months for IPSS, PSA, PVR  and exam, as testosterone therapy can cause prostate enlargement and worsen LUTS  3. Erectile dysfunction:    -SHIM score is 5 - RTC for Trimix titration - RTC in 6 months for SHIM score and exam, as testosterone therapy can affect erections  4. Nocturia - I explained to the patient that nocturia is often multi-factorial and difficult to treat.  Sleeping disorders, heart conditions, peripheral vascular disease, diabetes, an enlarged prostate for men, an urethral stricture causing bladder outlet obstruction and/or certain medications can contribute to nocturia. - I have suggested that the patient avoid caffeine after noon and alcohol in the evening.  He or she may also benefit from fluid restrictions after 6:00 in the evening and voiding just prior to bedtime. - I have explained that research studies have showed that over 84% of patients with sleep apnea reported frequent nighttime urination.   With sleep apnea, oxygen decreases, carbon dioxide increases, the blood become more acidic, the heart rate drops and blood vessels in the lung constrict.  The body is then alerted that something is very wrong. The sleeper must wake enough to reopen the airway. By this time, the heart is racing and experiences a false signal of fluid overload. The heart excretes a hormone-like protein that tells the body to get rid of sodium and water, resulting in nocturia. - The patient may benefit from a discussion with his or her primary care physician to see if he or she has risk factors for sleep apnea or other sleep disturbances and obtaining a sleep study especially since his PVR was minimal   Return for schedule Trimix injection in the am .  Zara Council, PA-C  [facility] [address] [place], [place] [address] [phone]  I, Adele Schilder, am acting as a scribe for [facility], PA-C   I have reviewed the above documentation for accuracy and completeness, and I agree with the above.    Zara Council, PA-C

## 2018-11-15 NOTE — Progress Notes (Signed)
Testosterone IM Injection  Due to Hypogonadism patient is present today for a Testosterone Injection.  Medication: Testosterone Cypionate Dose: 05 Location: right upper outer buttocks Lot: [id number] Exp10/2020  Patient tolerated well, no complications were noted  Preformed by: Eligha Bridegroom, CMA

## 2018-11-16 ENCOUNTER — Telehealth: Payer: Self-pay | Admitting: Urology

## 2018-11-16 NOTE — Telephone Encounter (Signed)
Mr. Dentinger stated that we need to call [facility] for  clarification the Trimix prescription.

## 2018-11-16 NOTE — Telephone Encounter (Signed)
Tried to reach Walter Hodges, but he did not answer his phone and his voice mail box was full.

## 2018-11-16 NOTE — Telephone Encounter (Signed)
Pt asking to speak to [facility], did not give details. Please call pt at [phone]. Thank you.

## 2018-11-21 NOTE — Telephone Encounter (Signed)
Previously address with [facility]

## 2018-11-27 ENCOUNTER — Encounter: Payer: Self-pay | Admitting: Emergency Medicine

## 2018-11-27 ENCOUNTER — Emergency Department
Admission: EM | Admit: 2018-11-27 | Discharge: 2018-11-27 | Disposition: A | Discharge status: Home / Self Care | Payer: Medicaid Other | Attending: Emergency Medicine | Admitting: Emergency Medicine

## 2018-11-27 ENCOUNTER — Other Ambulatory Visit: Payer: Self-pay

## 2018-11-27 DIAGNOSIS — T783XXA Angioneurotic edema, initial encounter: Secondary | ICD-10-CM | POA: Insufficient documentation

## 2018-11-27 DIAGNOSIS — Z79899 Other long term (current) drug therapy: Secondary | ICD-10-CM | POA: Insufficient documentation

## 2018-11-27 DIAGNOSIS — R600 Localized edema: Secondary | ICD-10-CM | POA: Diagnosis present

## 2018-11-27 DIAGNOSIS — I10 Essential (primary) hypertension: Secondary | ICD-10-CM | POA: Insufficient documentation

## 2018-11-27 MED ORDER — DIPHENHYDRAMINE HCL 50 MG/ML IJ SOLN
50.0000 mg | Freq: Once | INTRAMUSCULAR | Status: AC
  Administered 2018-11-27: 50 mg via INTRAMUSCULAR
  Filled 2018-11-27: qty 1

## 2018-11-27 MED ORDER — DIPHENHYDRAMINE HCL 50 MG/ML IJ SOLN: 50.0000 mg | Freq: Once | INTRAMUSCULAR | Status: DC

## 2018-11-27 MED ORDER — MAGIC MOUTHWASH: 5.0000 mL | Freq: Three times a day (TID) | ORAL | 0 refills | Status: DC | PRN

## 2018-11-27 NOTE — ED Notes (Signed)
Pt refusing vital signs at time of discharge. Pt states "I am late to work and need to go now".

## 2018-11-27 NOTE — ED Triage Notes (Signed)
R upper lip swelling x 2 hours. No resp distress.

## 2018-11-27 NOTE — Discharge Instructions (Signed)
Take Benadryl 50 mg (2 capsules) every 6 hours as needed for any swelling.  Return to the emergency department immediately if you feel the swelling is worsening, involving her tongue throat or you are having any trouble breathing.  Otherwise please follow-up with your doctor soon as possible.

## 2018-11-27 NOTE — ED Provider Notes (Signed)
[facility] Emergency Department Provider Note  Time seen: 7:31 PM  I have reviewed the triage vital signs and the nursing notes.   HISTORY  Chief Complaint Oral Swelling    HPI Walter Hodges is a 63 y.o. male with a past medical history of arthritis, hypertension, presents to the emergency department for lip swelling.  According to the patient for the past 1 to 2 hours he has noticed swelling in his right upper lip.  Patient states he used some Magic mouthwash and it seems to be improving the lip swelling.  Patient states no history of lip swelling in the past.  Does not take any ACE inhibitors.  No new foods known to the patient however he did just eat at [employer] just before the swelling began.   Past Medical History:  Diagnosis Date  . Arthritis   . Bell's palsy 2010   lasted one day ago, told that he had a ministroke  . Headache   . Hollenhorst plaque, right eye   . Hypertension    followed by Dr. Nita Sells" with [facility]      Patient Active Problem List   Diagnosis Date Noted  . Acute pain of right shoulder 07/05/2016  . 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 Chronic pain of both knees 03/19/2016  . Embolism involving retinal artery   . Insomnia 02/16/2016  . Hollenhorst plaque, right eye 02/16/2016  . Broken tooth, complicated 01/23/2016  . Hypertension 01/07/2016  . Total knee replacement status 01/01/2016  . Primary osteoarthritis of right knee 05/26/2015    Past Surgical History:  Procedure Laterality Date  . CIRCUMCISION     done as a child- given inhalation   . JOINT REPLACEMENT Bilateral   . KNEE CLOSED REDUCTION Left 03/19/2016   Procedure: CLOSED MANIPULATION LEFT KNEE;  Surgeon: Tarry Kos, MD;  Location[facility];  Service: Orthopedics;  Laterality: Left;  . TEE WITHOUT CARDIOVERSION N/A 02/19/2016   Procedure: TRANSESOPHAGEAL ECHOCARDIOGRAM (TEE);  Surgeon: Thurmon Fair,  MD;  Location: [facility] ENDOSCOPY;  Service: Cardiovascular;  Laterality: N/A;  . TOTAL KNEE ARTHROPLASTY Right 05/26/2015   Procedure: RIGHT TOTAL KNEE ARTHROPLASTY;  Surgeon: Tarry Kos, MD;  Location: [facility];  Service: Orthopedics;  Laterality: Right;  . TOTAL KNEE ARTHROPLASTY Left 01/01/2016   Procedure: LEFT TOTAL KNEE ARTHROPLASTY;  Surgeon: Tarry Kos, MD;  Location: [facility];  Service: Orthopedics;  Laterality: Left;    Prior to Admission medications   Medication Sig Start Date End Date Taking? Authorizing Provider  amLODipine (NORVASC) 10 MG tablet  12/07/16   [provider]  ketoconazole (NIZORAL) 2 % cream Apply 1 application topically daily. 10/22/18   Orvil Feil, PA-C  magic mouthwash w/lidocaine SOLN Take 10 mLs by mouth 3 (three) times daily. 10/08/18   Orvil Feil, PA-C  NONFORMULARY OR COMPOUNDED ITEM Trimix (30/1/50)-(Pap/Phent/PGE) Dosage: Inject 7 cc per injection 3cc size vial Qty1 Refills 3 [facility] [phone] Fax [phone] 11/15/18   Harle Battiest, PA-C  oxyCODONE-acetaminophen (PERCOCET) 10-325 MG tablet TK 1 T PO FID PRF PAIN 05/04/18   [provider]  sildenafil (REVATIO) 20 MG tablet Take 1-5 tablets as needed 30 minutes prior to sexual activity. 09/20/18   Stoioff, Verna Czech, MD  testosterone cypionate (DEPOTESTOSTERONE CYPIONATE) 200 MG/ML injection Inject 1 mL (200 mg total) into the muscle every 14 (fourteen) days. 06/22/18   Riki Altes, MD  Allergies  Allergen Reactions  . No Known Allergies     Family History  Problem Relation Age of Onset  . Headache Neg Hx     Social History Social History   Tobacco Use  . Smoking status: Never Smoker  . Smokeless tobacco: Never Used  Substance Use Topics  . Alcohol use: No    Comment: Socially in the past  . Drug use: No    Review of Systems Constitutional: Negative for fever. ENT: Mild right upper lip swelling. Cardiovascular: Negative for chest  pain. Respiratory: Negative for shortness of breath. Gastrointestinal: Negative for abdominal pain, vomiting  Musculoskeletal: Negative for musculoskeletal complaints Neurological: Negative for headache All other ROS negative  ____________________________________________   PHYSICAL EXAM:  VITAL SIGNS: ED Triage Vitals  Enc Vitals Group     BP 11/27/18 1852 (!) 169/87     Pulse Rate 11/27/18 1852 75     Resp 11/27/18 1852 18     Temp 11/27/18 1852 98.2 F (36.8 C)     Temp Source 11/27/18 1852 Oral     SpO2 11/27/18 1852 97 %     Weight 11/27/18 1856 250 lb (113.4 kg)     Height 11/27/18 1856 5\' 10"  (1.778 m)     Head Circumference --      Peak Flow --      Pain Score 11/27/18 1856 0     Pain Loc --      Pain Edu? --      Excl. in GC? --    Constitutional: Alert and oriented. Well appearing and in no distress. Eyes: Normal exam ENT   Head: Normocephalic and atraumatic.   Mouth/Throat: Mucous membranes are moist.  Mild amount of right upper lip swelling.  No intraoral lesions. Cardiovascular: Normal rate, regular rhythm. No murmur Respiratory: Normal respiratory effort without tachypnea nor retractions. Breath sounds are clear  Gastrointestinal: Soft and nontender. No distention.   Musculoskeletal: Nontender with normal range of motion in all extremities.  Neurologic:  Normal speech and language. No gross focal neurologic deficits  Skin:  Skin is warm, dry and intact.  Psychiatric: Mood and affect are normal.     INITIAL IMPRESSION / ASSESSMENT AND PLAN / ED COURSE  Pertinent labs & imaging results that were available during my care of the patient were reviewed by me and considered in my medical decision making (see chart for details).  Patient presents to the emergency department for right upper lip swelling that started 1 to 2 hours before arrival.  Patient states he was eating [employer] just before the swelling started.  No history of swelling in the  past.  Does not use any ACE inhibitors.  Suspect likely allergic reaction.  We will dose IM Benadryl and reassess.  Patient states the swelling is already started to come down.  Overall the patient appears very well.  No wheeze.  No difficulty breathing.  No hives or itching.  Patient states the lip feels much better, mild reduction in swelling clinically.  Patient is asking for discharge papers, states he is ready to go home.  We will discharge the patient home I discussed continued Benadryl 50 mg every 6 hours for any swelling as well as PCP follow-up.  ____________________________________________   FINAL CLINICAL IMPRESSION(S) / ED DIAGNOSES  Allergic reaction   Minna Antis, MD 11/27/18 2253

## 2018-11-29 ENCOUNTER — Ambulatory Visit (INDEPENDENT_AMBULATORY_CARE_PROVIDER_SITE_OTHER): Payer: Medicaid Other | Admitting: Family Medicine

## 2018-11-29 ENCOUNTER — Other Ambulatory Visit: Payer: Self-pay

## 2018-11-29 DIAGNOSIS — E349 Endocrine disorder, unspecified: Secondary | ICD-10-CM | POA: Diagnosis not present

## 2018-11-29 MED ORDER — TESTOSTERONE CYPIONATE 200 MG/ML IM SOLN
200.0000 mg | Freq: Once | INTRAMUSCULAR | Status: AC
  Administered 2018-11-29: 200 mg via INTRAMUSCULAR

## 2018-11-29 NOTE — Progress Notes (Signed)
Testosterone IM Injection  Due to Hypogonadism patient is present today for a Testosterone Injection.  Medication: Testosterone Cypionate Dose: Location: left upper outer buttocks Lot: [id number] Exp:03/2020  Patient tolerated well, no complications were noted  Preformed by: Teressa Lower, CMA  Follow up: 2 weeks

## 2018-12-01 ENCOUNTER — Ambulatory Visit: Payer: Medicaid Other

## 2018-12-06 ENCOUNTER — Ambulatory Visit: Payer: Medicaid Other | Admitting: Urology

## 2018-12-13 ENCOUNTER — Ambulatory Visit: Payer: Medicaid Other

## 2018-12-13 ENCOUNTER — Encounter: Payer: Self-pay | Admitting: Urology

## 2018-12-21 ENCOUNTER — Ambulatory Visit: Payer: Medicaid Other

## 2018-12-21 ENCOUNTER — Encounter: Payer: Self-pay | Admitting: Urology

## 2018-12-26 ENCOUNTER — Other Ambulatory Visit: Payer: Self-pay

## 2018-12-26 ENCOUNTER — Ambulatory Visit (INDEPENDENT_AMBULATORY_CARE_PROVIDER_SITE_OTHER): Payer: Medicaid Other

## 2018-12-26 DIAGNOSIS — E349 Endocrine disorder, unspecified: Secondary | ICD-10-CM | POA: Diagnosis not present

## 2018-12-26 MED ORDER — TESTOSTERONE CYPIONATE 200 MG/ML IM SOLN
200.0000 mg | Freq: Once | INTRAMUSCULAR | Status: AC
  Administered 2018-12-26: 200 mg via INTRAMUSCULAR

## 2018-12-26 NOTE — Progress Notes (Signed)
Testosterone IM Injection  Due to Hypogonadism patient is present today for a Testosterone Injection.  Medication: Testosterone Cypionate Dose: 64ml Location: right upper outer buttocks Lot: [id number] Exp:06/2019  Patient tolerated well, no complications were noted  Preformed by: Delena Bali, CMA  Follow up: 2 weeks

## 2019-01-09 ENCOUNTER — Ambulatory Visit: Payer: Medicaid Other

## 2019-01-11 ENCOUNTER — Other Ambulatory Visit: Payer: Self-pay

## 2019-01-11 ENCOUNTER — Ambulatory Visit (INDEPENDENT_AMBULATORY_CARE_PROVIDER_SITE_OTHER): Payer: Medicaid Other | Admitting: *Deleted

## 2019-01-11 DIAGNOSIS — E291 Testicular hypofunction: Secondary | ICD-10-CM | POA: Diagnosis not present

## 2019-01-11 MED ORDER — TESTOSTERONE CYPIONATE 200 MG/ML IM SOLN
200.0000 mg | Freq: Once | INTRAMUSCULAR | Status: AC
  Administered 2019-01-11: 200 mg via INTRAMUSCULAR

## 2019-01-11 NOTE — Progress Notes (Signed)
Testosterone IM Injection  Due to Hypogonadism patient is present today for a Testosterone Injection.  Medication: Testosterone Cypionate Dose: 54ml Location: right upper outer buttocks Lot: [id number] Exp:7/21  Patient tolerated well, no complications were noted  Preformed by: Rebeca Morris,CMA  Follow up: Two weeks

## 2019-01-15 ENCOUNTER — Telehealth: Payer: Self-pay | Admitting: Urology

## 2019-01-15 NOTE — Telephone Encounter (Signed)
Pt. Would like for Carollee Herter to call him in reference to an upcoming appointment.

## 2019-01-16 ENCOUNTER — Telehealth: Payer: Self-pay | Admitting: Urology

## 2019-01-16 NOTE — Telephone Encounter (Signed)
Please call pt back and let him know that he does not need a refill there is plenty of medication left in is bottle here at the office. Thanks

## 2019-01-16 NOTE — Telephone Encounter (Signed)
Pt LMOM and needs a refill for his Testosterone. Please advise.

## 2019-01-17 NOTE — Telephone Encounter (Signed)
I have called in the super trimix test vial for Mr. Guerrieri for his appointment on 02/09/2019.

## 2019-01-25 ENCOUNTER — Other Ambulatory Visit: Payer: Self-pay

## 2019-01-25 ENCOUNTER — Ambulatory Visit (INDEPENDENT_AMBULATORY_CARE_PROVIDER_SITE_OTHER): Payer: Medicaid Other

## 2019-01-25 DIAGNOSIS — E291 Testicular hypofunction: Secondary | ICD-10-CM

## 2019-01-25 MED ORDER — TESTOSTERONE CYPIONATE 200 MG/ML IM SOLN
200.0000 mg | Freq: Once | INTRAMUSCULAR | Status: AC
  Administered 2019-01-25: 200 mg via INTRAMUSCULAR

## 2019-01-25 NOTE — Progress Notes (Signed)
Testosterone IM Injection  Due to Hypogonadism patient is present today for a Testosterone Injection.  Medication: Testosterone Cypionate Dose: 74ml Location: left upper outer buttocks Lot: [id number]  Exp:06/2019  Patient tolerated well, no complications were noted  Preformed by: Eligha Bridegroom, CMA  Follow up: 2 wks

## 2019-02-07 ENCOUNTER — Other Ambulatory Visit: Payer: Self-pay

## 2019-02-07 ENCOUNTER — Ambulatory Visit (INDEPENDENT_AMBULATORY_CARE_PROVIDER_SITE_OTHER): Payer: Medicaid Other

## 2019-02-07 DIAGNOSIS — E291 Testicular hypofunction: Secondary | ICD-10-CM

## 2019-02-07 DIAGNOSIS — E349 Endocrine disorder, unspecified: Secondary | ICD-10-CM | POA: Diagnosis not present

## 2019-02-07 MED ORDER — TESTOSTERONE CYPIONATE 200 MG/ML IM SOLN
200.0000 mg | Freq: Once | INTRAMUSCULAR | Status: AC
  Administered 2019-02-07: 200 mg via INTRAMUSCULAR

## 2019-02-07 NOTE — Progress Notes (Signed)
Testosterone IM Injection  Due to Hypogonadism patient is present today for a Testosterone Injection.  Medication: Testosterone Cypionate Dose: 83mL Location: right upper outer buttocks Lot: [id number] Exp:06/2019  Patient tolerated well, no complications were noted  Preformed by: Debbe Bales, CMA (AAMA)  Follow up: As scheduled

## 2019-02-08 NOTE — Progress Notes (Signed)
11/15/2018 9:45 AM   Walter HolmsMichael A Epple 05-27-1956 [id number]  Referring provider: Hoy RegisterNewlin, [place], MD [address] , [place] [id number]  Chief Complaint  Patient presents with  . Erectile Dysfunction    Trimix injection    HPI: Walter Hodges is a 63 y.o. male African-American with testosterone deficiency and erectile dysfunction who presents today for Trimix titration.    He presented to the office on 06/01/2018 for his initial Trimix titration.  He achieved a semi-rigid erection with 6 mcg of Trimix.  He now presents for a titration of Super Trimix.    PMH: Past Medical History:  Diagnosis Date  . Arthritis   . Bell's palsy 2010   lasted one day ago, told that he had a ministroke  . Headache   . Hollenhorst plaque, right eye   . Hypertension    followed by Dr. Nita Sells"maryann" with [facility]      Surgical History: Past Surgical History:  Procedure Laterality Date  . CIRCUMCISION     done as a child- given inhalation   . JOINT REPLACEMENT Bilateral   . KNEE CLOSED REDUCTION Left 03/19/2016   Procedure: CLOSED MANIPULATION LEFT KNEE;  Surgeon: Tarry KosNaiping M Xu, MD;  Location: [facility];  Service: Orthopedics;  Laterality: Left;  . TEE WITHOUT CARDIOVERSION N/A 02/19/2016   Procedure: TRANSESOPHAGEAL ECHOCARDIOGRAM (TEE);  Surgeon: Thurmon FairMihai Croitoru, MD;  [facility];  Service: Cardiovascular;  Laterality: N/A;  . TOTAL KNEE ARTHROPLASTY Right 05/26/2015   Procedure: RIGHT TOTAL KNEE ARTHROPLASTY;  Surgeon: Tarry KosNaiping M Xu, MD;  Location: [facility] OR;  Service: Orthopedics;  Laterality: Right;  . TOTAL KNEE ARTHROPLASTY Left 01/01/2016   Procedure: LEFT TOTAL KNEE ARTHROPLASTY;  Surgeon: Tarry KosNaiping M Xu, MD;  Location: [facility] OR;  Service: Orthopedics;  Laterality: Left;    Home Medications:  Allergies as of 02/09/2019      Reactions   No Known Allergies       Medication List       Accurate as of February 09, 2019 11:59 PM. If you have any questions, ask your nurse or doctor.       Amitiza 24 MCG capsule Generic drug:  lubiprostone   amLODipine 10 MG tablet Commonly known as:  NORVASC   ketoconazole 2 % cream Commonly known as:  NIZORAL Apply 1 application topically daily.   magic mouthwash Soln Take 5 mLs by mouth 3 (three) times daily as needed for mouth pain.   magic mouthwash w/lidocaine Soln Take 10 mLs by mouth 3 (three) times daily.   NONFORMULARY OR COMPOUNDED ITEM Trimix (30/1/50)-(Pap/Phent/PGE) Dosage: Inject 7 cc per injection 3cc size vial Qty1 Refills [facility] [phone] Fax [phone]   oxyCODONE-acetaminophen 10-325 MG tablet Commonly known as:  PERCOCET TK 1 T PO FID PRF PAIN   sildenafil 20 MG tablet Commonly known as:  REVATIO Take 1-5 tablets as needed 30 minutes prior to sexual activity.   testosterone cypionate 200 MG/ML injection Commonly known as:  DEPOTESTOSTERONE CYPIONATE Inject 1 mL (200 mg total) into the muscle every 14 (fourteen) days.       Allergies:  Allergies  Allergen Reactions  . No Known Allergies     Family History: Family History  Problem Relation Age of Onset  . Headache Neg Hx     Social History:  reports that he has never smoked. He has never used smokeless tobacco. He reports that he does not drink alcohol or use drugs.  ROS: UROLOGY Frequent Urination?: No Hard  to postpone urination?: No Burning/pain with urination?: No Get up at night to urinate?: No Leakage of urine?: No Urine stream starts and stops?: No Trouble starting stream?: No Do you have to strain to urinate?: No Blood in urine?: No Urinary tract infection?: No Sexually transmitted disease?: No Injury to kidneys or bladder?: No Painful intercourse?: No Weak stream?: No Erection problems?: No Penile pain?: No  Gastrointestinal Nausea?: No Vomiting?: No Indigestion/heartburn?: No Diarrhea?: No Constipation?: No  Constitutional Fever: No Night sweats?: No Weight loss?: No Fatigue?: No   Skin Skin rash/lesions?: No Itching?: No  Eyes Blurred vision?: No Double vision?: No  Ears/Nose/Throat Sore throat?: No Sinus problems?: No  Hematologic/Lymphatic Swollen glands?: No Easy bruising?: No  Cardiovascular Leg swelling?: No Chest pain?: No  Respiratory Cough?: No Shortness of breath?: No  Endocrine Excessive thirst?: No  Musculoskeletal Back pain?: No Joint pain?: No  Neurological Headaches?: No Dizziness?: No  Psychologic Depression?: No Anxiety?: No  Physical Exam: BP 125/87   Pulse 85   Ht 5\' 10"  (1.778 m)   Wt 250 lb (113.4 kg)   BMI 35.87 kg/m   Constitutional:  Well nourished. Alert and oriented, No acute distress. HEENT: [place] AT, moist mucus membranes.  Trachea midline, no masses. Cardiovascular: No clubbing, cyanosis, or edema. Respiratory: Normal respiratory effort, no increased work of breathing. Neurologic: Grossly intact, no focal deficits, moving all 4 extremities. Psychiatric: Normal mood and affect.  Laboratory Data:  Lab Results  Component Value Date   TESTOSTERONE 464 10/04/2018   PSA Trend  3.1 ng/mL on 12/26/2017  1.0 ng/mL on 09/27/2018  I have reviewed the labs.  I have independently reviewed the films.  Assessment & Plan:    1. Testosterone deficiency  Needs appointment in July  2. BPH with LUTS Needs appointment in July  3. Erectile dysfunction:    Our clinic was running behind and he needed to go to work, so I did not inject him today in the office.  He has been injecting himself at home and is comfortable with the procedure His bottle of Trimix 30/2/20 lot # [id number]   Exp.  02/10/2019 - I have made him syringes of 6 mcg each  I have advised him to inject only 3 mcg at first as the Trimix is a higher potency and to titrate up by one mcg Advised patient of the condition of priapism, painful erection lasting for more than four hours, and to contact the office immediately or seek treatment in the ED   Return for needs follow up appointment in July .  August, PA-C  [facility] [address] [place], [place] [phone]

## 2019-02-09 ENCOUNTER — Telehealth: Payer: Self-pay | Admitting: Urology

## 2019-02-09 ENCOUNTER — Encounter: Payer: Self-pay | Admitting: Urology

## 2019-02-09 ENCOUNTER — Other Ambulatory Visit: Payer: Self-pay

## 2019-02-09 ENCOUNTER — Ambulatory Visit (INDEPENDENT_AMBULATORY_CARE_PROVIDER_SITE_OTHER): Payer: Medicaid Other | Admitting: Urology

## 2019-02-09 VITALS — BP 125/87 | HR 85 | Ht 70.0 in | Wt 250.0 lb

## 2019-02-09 DIAGNOSIS — N5201 Erectile dysfunction due to arterial insufficiency: Secondary | ICD-10-CM | POA: Diagnosis not present

## 2019-02-09 NOTE — Telephone Encounter (Signed)
Would you make Walter Hodges an appointment in July for I PSS, SHIM and exam?  He will need labs prior to his visit.  PSA, testosterone (one week after injection), hbg and HCT to be drawn before appointment

## 2019-02-12 ENCOUNTER — Ambulatory Visit: Payer: Medicaid Other | Admitting: Urology

## 2019-02-22 ENCOUNTER — Encounter: Payer: Self-pay | Admitting: Urology

## 2019-02-22 ENCOUNTER — Ambulatory Visit: Payer: Medicaid Other

## 2019-03-05 ENCOUNTER — Ambulatory Visit (INDEPENDENT_AMBULATORY_CARE_PROVIDER_SITE_OTHER): Payer: Medicaid Other | Admitting: Family Medicine

## 2019-03-05 ENCOUNTER — Other Ambulatory Visit: Payer: Self-pay

## 2019-03-05 DIAGNOSIS — E291 Testicular hypofunction: Secondary | ICD-10-CM | POA: Diagnosis not present

## 2019-03-05 MED ORDER — TESTOSTERONE CYPIONATE 200 MG/ML IM SOLN
200.0000 mg | Freq: Once | INTRAMUSCULAR | Status: AC
  Administered 2019-03-05: 200 mg via INTRAMUSCULAR

## 2019-03-05 NOTE — Progress Notes (Signed)
Testosterone IM Injection  Due to Hypogonadism patient is present today for a Testosterone Injection.  Medication: Testosterone Cypionate Dose: 21ml Location: left upper outer buttocks Lot: [id number] Exp:06/2019  Patient tolerated well, no complications were noted  Preformed by: Elberta Leatherwood, CMA  Follow up: 2 weeks

## 2019-03-21 ENCOUNTER — Ambulatory Visit (INDEPENDENT_AMBULATORY_CARE_PROVIDER_SITE_OTHER): Payer: Medicaid Other | Admitting: Family Medicine

## 2019-03-21 ENCOUNTER — Other Ambulatory Visit: Payer: Self-pay

## 2019-03-21 DIAGNOSIS — E291 Testicular hypofunction: Secondary | ICD-10-CM

## 2019-03-21 MED ORDER — TESTOSTERONE CYPIONATE 200 MG/ML IM SOLN
200.0000 mg | Freq: Once | INTRAMUSCULAR | Status: AC
  Administered 2019-03-21: 200 mg via INTRAMUSCULAR

## 2019-03-21 NOTE — Progress Notes (Signed)
Testosterone IM Injection  Due to Hypogonadism patient is present today for a Testosterone Injection.  Medication: Testosterone Cypionate Dose: 1ml Location: left upper outer buttocks Lot: [id number] Exp:06/2019  Patient tolerated well, no complications were noted  Preformed by: Carrie Garrison, CMA  Follow up: 2 weeks  

## 2019-03-26 ENCOUNTER — Other Ambulatory Visit: Payer: Self-pay | Admitting: Family Medicine

## 2019-03-26 DIAGNOSIS — E291 Testicular hypofunction: Secondary | ICD-10-CM

## 2019-03-27 ENCOUNTER — Other Ambulatory Visit: Payer: Medicaid Other

## 2019-03-27 ENCOUNTER — Other Ambulatory Visit: Payer: Self-pay

## 2019-03-27 DIAGNOSIS — E291 Testicular hypofunction: Secondary | ICD-10-CM

## 2019-03-28 LAB — TESTOSTERONE: Testosterone: 668 ng/dL (ref 264–916)

## 2019-04-02 NOTE — Progress Notes (Signed)
11/15/2018 11:17 AM   Walter Hodges 1955/10/28 [id number]  Referring provider: Hoy Register, MD [address] [place],  [place] [address]  Chief Complaint  Patient presents with  . Hypogonadism    HPI: Walter Hodges is a 63 y.o. male with testosterone deficiency, erectile dysfunction and BPH with LU TS who presents for a follow up.    Testosterone deficiency He is no longer having spontaneous erections at night. He is currently managing his testosterone deficiency with testosterone cypionate 200 mg/IM, 1 cc every two weeks.  His current testosterone level is 668 ng/dL on 19/14/7829.    Erectile dysfunction  Patient is having satisfactory erections with Super Trimix.   He denies any painful erections or curvature with erections.   BPH WITH LUTS  (prostate and/or bladder) IPSS score: 6/3        Major complaint(s):  Frequency and nocturia - which are baseline.  Denies any dysuria, hematuria or suprapubic pain.   Denies any recent fevers, chills, nausea or vomiting.  He does not have a family history of PCa.  IPSS    Row Name 04/03/19 1000         International Prostate Symptom Score   How often have you had the sensation of not emptying your bladder?  Not at All Simultaneous filing. User may not have seen previous data.     How often have you had to urinate less than every two hours?  About half the time Simultaneous filing. User may not have seen previous data.     How often have you found you stopped and started again several times when you urinated?  Not at All Simultaneous filing. User may not have seen previous data.     How often have you found it difficult to postpone urination?  Not at All Simultaneous filing. User may not have seen previous data.     How often have you had a weak urinary stream?  Not at All Simultaneous filing. User may not have seen previous data.     How often have you had to strain to start urination?  Not at All Simultaneous filing.  User may not have seen previous data.     How many times did you typically get up at night to urinate?  3 Times Simultaneous filing. User may not have seen previous data.     Total IPSS Score  6 Simultaneous filing. User may not have seen previous data.       Quality of Life due to urinary symptoms   If you were to spend the rest of your life with your urinary condition just the way it is now how would you feel about that?  Mixed Simultaneous filing. User may not have seen previous data.        Score:  1-7 Mild 8-19 Moderate 20-35 Severe     PMH: Past Medical History:  Diagnosis Date  . Arthritis   . Bell's palsy 2010   lasted one day ago, told that he had a ministroke  . Headache   . Hollenhorst plaque, right eye   . Hypertension    followed by Dr. Nita Sells" with [facility]      Surgical History: Past Surgical History:  Procedure Laterality Date  . CIRCUMCISION     done as a child- given inhalation   . JOINT REPLACEMENT Bilateral   . KNEE CLOSED REDUCTION Left 03/19/2016   Procedure: CLOSED MANIPULATION LEFT KNEE;  Surgeon: Tarry Kos, MD;  Location: [facility];  Service: Orthopedics;  Laterality: Left;  . TEE WITHOUT CARDIOVERSION N/A 02/19/2016   Procedure: TRANSESOPHAGEAL ECHOCARDIOGRAM (TEE);  Surgeon: Thurmon Fair, MD;  Location: [facility] ENDOSCOPY;  Service: Cardiovascular;  Laterality: N/A;  . TOTAL KNEE ARTHROPLASTY Right 05/26/2015   Procedure: RIGHT TOTAL KNEE ARTHROPLASTY;  Surgeon: Tarry Kos, MD;  Location: [facility] OR;  Service: Orthopedics;  Laterality: Right;  . TOTAL KNEE ARTHROPLASTY Left 01/01/2016   Procedure: LEFT TOTAL KNEE ARTHROPLASTY;  Surgeon: Tarry Kos, MD;  Location: [facility];  Service: Orthopedics;  Laterality: Left;    Home Medications:  Allergies as of 04/03/2019      Reactions   No Known Allergies       Medication List       Accurate as of April 03, 2019 11:17 AM. If you have any questions, ask your nurse or doctor.        STOP taking these  medications   magic mouthwash Soln Stopped by: Michiel Cowboy, PA-C   magic mouthwash w/lidocaine Soln Stopped by: Natanel Snavely, PA-C   oxyCODONE-acetaminophen 10-325 MG tablet Commonly known as: PERCOCET Stopped by: Michiel Cowboy, PA-C     TAKE these medications   Amitiza 24 MCG capsule Generic drug: lubiprostone   amLODipine 10 MG tablet Commonly known as: NORVASC   ketoconazole 2 % cream Commonly known as: NIZORAL Apply 1 application topically daily.   NONFORMULARY OR COMPOUNDED ITEM Trimix (30/1/50)-(Pap/Phent/PGE) Dosage: Inject 7 cc per injection 3cc size vial Qty1 Refills 3 [facility] [phone] Fax [phone]   sildenafil 20 MG tablet Commonly known as: REVATIO Take 1-5 tablets as needed 30 minutes prior to sexual activity.   testosterone cypionate 200 MG/ML injection Commonly known as: DEPOTESTOSTERONE CYPIONATE Inject 1 mL (200 mg total) into the muscle every 14 (fourteen) days.       Allergies:  Allergies  Allergen Reactions  . No Known Allergies     Family History: Family History  Problem Relation Age of Onset  . Headache Neg Hx     Social History:  reports that he has never smoked. He has never used smokeless tobacco. He reports that he does not drink alcohol or use drugs.  ROS: UROLOGY Frequent Urination?: Yes Hard to postpone urination?: No Burning/pain with urination?: No Get up at night to urinate?: Yes Leakage of urine?: No Urine stream starts and stops?: No Trouble starting stream?: No Do you have to strain to urinate?: No Blood in urine?: No Urinary tract infection?: No Sexually transmitted disease?: No Injury to kidneys or bladder?: No Painful intercourse?: No Weak stream?: No Erection problems?: Yes Penile pain?: No  Gastrointestinal Nausea?: No Vomiting?: No Indigestion/heartburn?: No Diarrhea?: No Constipation?: No  Constitutional Fever: No Night sweats?: No Weight loss?: No Fatigue?: No   Skin Skin rash/lesions?: No Itching?: No  Eyes Blurred vision?: No Double vision?: No  Ears/Nose/Throat Sore throat?: No Sinus problems?: No  Hematologic/Lymphatic Swollen glands?: No Easy bruising?: No  Cardiovascular Leg swelling?: No Chest pain?: No  Respiratory Cough?: No Shortness of breath?: No  Endocrine Excessive thirst?: No  Musculoskeletal Back pain?: No Joint pain?: No  Neurological Headaches?: No Dizziness?: No  Psychologic Depression?: No Anxiety?: No  Physical Exam: BP 127/72 (BP Location: Left Arm, Patient Position: Sitting, Cuff Size: Normal)   Pulse 84   Ht 5\' 10"  (1.778 m)   Wt 250 lb (113.4 kg)   BMI 35.87 kg/m   Constitutional:  Well nourished. Alert and oriented, No acute distress. HEENT: [place]  AT, moist mucus membranes.  Trachea midline, no masses. Cardiovascular: No clubbing, cyanosis, or edema. Respiratory: Normal respiratory effort, no increased work of breathing. GI: Abdomen is soft, non tender, non distended, no abdominal masses. Liver and spleen not palpable.  No hernias appreciated.  Stool sample for occult testing is not indicated.   GU: No CVA tenderness.  No bladder fullness or masses.  Patient with circumcised phallus.  Urethral meatus is patent.  No penile discharge. No penile lesions or rashes. Scrotum without lesions, cysts, rashes and/or edema.  Testicles are located scrotally bilaterally. No masses are appreciated in the testicles. Left and right epididymis are normal. Rectal: Patient with  normal sphincter tone. Anus and perineum without scarring or rashes. No rectal masses are appreciated. Prostate is approximately 50 grams, could only palpate the apex, no nodules are appreciated. Seminal vesicles could not palpate.   Skin: No rashes, bruises or suspicious lesions. Lymph: No inguinal adenopathy. Neurologic: Grossly intact, no focal deficits, moving all 4 extremities. Psychiatric: Normal mood and affect.   Laboratory  Data:  Lab Results  Component Value Date   TESTOSTERONE 668 03/27/2019   PSA Trend  3.1 ng/mL on 12/26/2017  1.0 ng/mL on 09/27/2018  I have reviewed the labs.  I have independently reviewed the films.  Assessment & Plan:    1. Testosterone deficiency   -most recent testosterone level is 668 ng/dL on 65/7846 (goal 962-952 ng/dL)  -continue testosterone cypionate 200 mg/IM, 1 cc every two weeks  -RTC in 6 months for HCT/HBG, testosterone and exam  2. BPH with LUTS  - IPSS score is 6/3  - Continue conservative management, avoiding bladder irritants and timed voiding's  - RTC in 6 months for IPSS, PSA and exam, as testosterone therapy can cause prostate enlargement and worsen LUTS  3. Erectile dysfunction:    Continue with Super Trimix   Advised patient of the condition of priapism, painful erection lasting for more than four hours, and to contact the office immediately or seek treatment in the ED  Return in about 6 months (around 10/04/2019) for PSA, testosterone (one week after injection) H & H, IPSS, SHIM and exam.  Michiel Cowboy, [facility]  [facility] [address], [address] [place], [place] [address] [phone]

## 2019-04-03 ENCOUNTER — Other Ambulatory Visit: Payer: Self-pay

## 2019-04-03 ENCOUNTER — Ambulatory Visit (INDEPENDENT_AMBULATORY_CARE_PROVIDER_SITE_OTHER): Payer: Medicaid Other | Admitting: Urology

## 2019-04-03 ENCOUNTER — Telehealth: Payer: Self-pay | Admitting: Urology

## 2019-04-03 ENCOUNTER — Encounter: Payer: Self-pay | Admitting: Urology

## 2019-04-03 VITALS — BP 127/72 | HR 84 | Ht 70.0 in | Wt 250.0 lb

## 2019-04-03 DIAGNOSIS — E349 Endocrine disorder, unspecified: Secondary | ICD-10-CM

## 2019-04-03 DIAGNOSIS — N401 Enlarged prostate with lower urinary tract symptoms: Secondary | ICD-10-CM | POA: Diagnosis not present

## 2019-04-03 DIAGNOSIS — N138 Other obstructive and reflux uropathy: Secondary | ICD-10-CM

## 2019-04-03 DIAGNOSIS — N5201 Erectile dysfunction due to arterial insufficiency: Secondary | ICD-10-CM

## 2019-04-03 MED ORDER — NONFORMULARY OR COMPOUNDED ITEM: 3 refills | Status: DC

## 2019-04-03 NOTE — Telephone Encounter (Signed)
Refill printed and faxed to [facility]

## 2019-04-03 NOTE — Telephone Encounter (Signed)
Would you call in a refill for Trimix (11/04/48) to [facility]?

## 2019-04-04 ENCOUNTER — Telehealth: Payer: Self-pay

## 2019-04-04 LAB — HEMATOCRIT: Hematocrit: 44.2 % (ref 37.5–51.0)

## 2019-04-04 LAB — PSA: Prostate Specific Ag, Serum: 1 ng/mL (ref 0.0–4.0)

## 2019-04-04 LAB — HEMOGLOBIN: Hemoglobin: 15 g/dL (ref 13.0–17.7)

## 2019-04-04 NOTE — Telephone Encounter (Signed)
Patient notified on vmail per DPR 

## 2019-04-04 NOTE — Telephone Encounter (Signed)
Pt called office and I read message from [facility] that his labs were good.

## 2019-04-04 NOTE — Telephone Encounter (Signed)
-----   Message from Nori Riis, PA-C sent at 04/04/2019  7:50 AM EDT ----- Please let Mr. Agena know that his labs were good.

## 2019-04-05 ENCOUNTER — Ambulatory Visit (INDEPENDENT_AMBULATORY_CARE_PROVIDER_SITE_OTHER): Payer: Medicaid Other | Admitting: *Deleted

## 2019-04-05 ENCOUNTER — Other Ambulatory Visit: Payer: Self-pay

## 2019-04-05 DIAGNOSIS — E349 Endocrine disorder, unspecified: Secondary | ICD-10-CM

## 2019-04-05 MED ORDER — TESTOSTERONE CYPIONATE 200 MG/ML IM SOLN
200.0000 mg | Freq: Once | INTRAMUSCULAR | Status: AC
  Administered 2019-04-05: 200 mg via INTRAMUSCULAR

## 2019-04-05 NOTE — Progress Notes (Signed)
Testosterone IM Injection  Due to Hypogonadism patient is present today for a Testosterone Injection.  Medication: Testosterone Cypionate Dose: 70ml  Location: right upper outer buttocks Lot: [id number] Exp:06/2019  Patient tolerated well, no complications were noted  Preformed by: Rebeca Morris,CMA  Follow up: 2 weeks

## 2019-04-24 ENCOUNTER — Telehealth: Payer: Self-pay | Admitting: Urology

## 2019-04-24 NOTE — Telephone Encounter (Signed)
Pt LMOM and wants to know when his next appt should be. He has a follow up 10/04/2019 does he need to be seen sooner? Please advise

## 2019-04-24 NOTE — Telephone Encounter (Signed)
He is to see Larene Beach in 6 months as scheduled and continue with testosterone injections every 2 weeks. His last injection was 04/05/2019 so he should have had an injection on 04/19/2019. He can be scheduled at his convenience for shot. Thanks

## 2019-04-25 ENCOUNTER — Other Ambulatory Visit: Payer: Self-pay

## 2019-04-25 ENCOUNTER — Ambulatory Visit (INDEPENDENT_AMBULATORY_CARE_PROVIDER_SITE_OTHER): Payer: Medicaid Other

## 2019-04-25 DIAGNOSIS — E349 Endocrine disorder, unspecified: Secondary | ICD-10-CM

## 2019-04-25 DIAGNOSIS — E291 Testicular hypofunction: Secondary | ICD-10-CM

## 2019-04-25 MED ORDER — TESTOSTERONE CYPIONATE 200 MG/ML IM SOLN
200.0000 mg | Freq: Once | INTRAMUSCULAR | Status: AC
  Administered 2019-04-25: 200 mg via INTRAMUSCULAR

## 2019-04-25 NOTE — Progress Notes (Signed)
Testosterone IM Injection  Due to Hypogonadism patient is present today for a Testosterone Injection.  Medication: Testosterone Cypionate Dose: 52mL Location: right upper outer buttocks Lot: [id number] Exp:06/2019  Patient tolerated well, no complications were noted  Preformed by: Gordy Clement, CMA (AAMA)  Follow up: 2 weeks for next injection

## 2019-05-09 ENCOUNTER — Ambulatory Visit (INDEPENDENT_AMBULATORY_CARE_PROVIDER_SITE_OTHER): Payer: Medicaid Other

## 2019-05-09 ENCOUNTER — Other Ambulatory Visit: Payer: Self-pay

## 2019-05-09 DIAGNOSIS — E349 Endocrine disorder, unspecified: Secondary | ICD-10-CM | POA: Diagnosis not present

## 2019-05-09 DIAGNOSIS — E291 Testicular hypofunction: Secondary | ICD-10-CM | POA: Diagnosis not present

## 2019-05-09 MED ORDER — TESTOSTERONE CYPIONATE 200 MG/ML IM SOLN
200.0000 mg | Freq: Once | INTRAMUSCULAR | Status: AC
  Administered 2019-05-09: 200 mg via INTRAMUSCULAR

## 2019-05-09 MED ORDER — TESTOSTERONE CYPIONATE 200 MG/ML IM SOLN: 200.0000 mg | INTRAMUSCULAR | 0 refills | Status: DC

## 2019-05-09 NOTE — Progress Notes (Signed)
Testosterone IM Injection  Due to Hypogonadism patient is present today for a Testosterone Injection.  Medication: Testosterone Cypionate Dose: 7mL Location: right upper outer buttocks Lot: [id number] Exp:06/2019  Patient tolerated well, no complications were noted  Preformed by: Gordy Clement, CMA   Follow up: RTC in 2 weeks for next injection

## 2019-05-23 ENCOUNTER — Ambulatory Visit: Payer: Medicaid Other

## 2019-05-24 ENCOUNTER — Ambulatory Visit: Payer: Medicaid Other | Admitting: *Deleted

## 2019-05-24 ENCOUNTER — Other Ambulatory Visit: Payer: Self-pay

## 2019-05-31 ENCOUNTER — Ambulatory Visit (INDEPENDENT_AMBULATORY_CARE_PROVIDER_SITE_OTHER): Payer: Medicaid Other | Admitting: *Deleted

## 2019-05-31 ENCOUNTER — Other Ambulatory Visit: Payer: Self-pay

## 2019-05-31 DIAGNOSIS — E349 Endocrine disorder, unspecified: Secondary | ICD-10-CM | POA: Diagnosis not present

## 2019-05-31 MED ORDER — TESTOSTERONE CYPIONATE 200 MG/ML IM SOLN
200.0000 mg | Freq: Once | INTRAMUSCULAR | Status: AC
  Administered 2019-05-31: 200 mg via INTRAMUSCULAR

## 2019-05-31 NOTE — Progress Notes (Signed)
Testosterone IM Injection  Due to Hypogonadism patient is present today for a Testosterone Injection.  Medication: Testosterone Cypionate Dose: 13ml Location: right upper outer buttocks Lot: [id number] Exp:01/2022  Patient tolerated well, no complications were noted  Performed by: Verlene Mayer, CMA  Follow up: Two weeks

## 2019-06-01 ENCOUNTER — Ambulatory Visit: Payer: Medicaid Other

## 2019-06-14 ENCOUNTER — Other Ambulatory Visit: Payer: Self-pay | Admitting: *Deleted

## 2019-06-14 ENCOUNTER — Ambulatory Visit (INDEPENDENT_AMBULATORY_CARE_PROVIDER_SITE_OTHER): Payer: Medicaid Other | Admitting: *Deleted

## 2019-06-14 ENCOUNTER — Other Ambulatory Visit: Payer: Self-pay

## 2019-06-14 DIAGNOSIS — E291 Testicular hypofunction: Secondary | ICD-10-CM

## 2019-06-14 DIAGNOSIS — E349 Endocrine disorder, unspecified: Secondary | ICD-10-CM | POA: Diagnosis not present

## 2019-06-14 MED ORDER — TESTOSTERONE CYPIONATE 200 MG/ML IM SOLN: 200.0000 mg | INTRAMUSCULAR | 0 refills | Status: DC

## 2019-06-14 MED ORDER — TESTOSTERONE CYPIONATE 200 MG/ML IM SOLN
200.0000 mg | Freq: Once | INTRAMUSCULAR | Status: AC
  Administered 2019-06-14: 200 mg via INTRAMUSCULAR

## 2019-06-14 NOTE — Progress Notes (Signed)
Testosterone IM Injection  Due to Hypogonadism patient is present today for a Testosterone Injection.  Medication: Testosterone Cypionate Dose: 1ml Location: right upper outer buttocks Lot: [id number] Exp:01/2022  Patient tolerated well, no complications were noted  Performed by: Jaielle Dlouhy, CMA  Follow up: Two weeks  

## 2019-06-26 ENCOUNTER — Other Ambulatory Visit: Payer: Self-pay

## 2019-06-26 ENCOUNTER — Ambulatory Visit (INDEPENDENT_AMBULATORY_CARE_PROVIDER_SITE_OTHER): Payer: Medicaid Other

## 2019-06-26 DIAGNOSIS — E349 Endocrine disorder, unspecified: Secondary | ICD-10-CM

## 2019-06-26 MED ORDER — TESTOSTERONE CYPIONATE 200 MG/ML IM SOLN
200.0000 mg | Freq: Once | INTRAMUSCULAR | Status: AC
  Administered 2019-06-26: 200 mg via INTRAMUSCULAR

## 2019-06-26 NOTE — Progress Notes (Signed)
Testosterone IM Injection  Due to Hypogonadism patient is present today for a Testosterone Injection.  Medication: Testosterone Cypionate Dose: 55ml Location: right upper outer buttocks Lot: [id number]  Patient tolerated well, no complications were noted  Preformed by: Gaspar Cola   Follow up: 2 weeks

## 2019-07-07 ENCOUNTER — Other Ambulatory Visit: Payer: Self-pay

## 2019-07-07 ENCOUNTER — Encounter: Payer: Self-pay | Admitting: Emergency Medicine

## 2019-07-07 ENCOUNTER — Emergency Department
Admission: EM | Admit: 2019-07-07 | Discharge: 2019-07-07 | Disposition: A | Discharge status: Home / Self Care | Payer: Medicaid Other | Attending: Emergency Medicine | Admitting: Emergency Medicine

## 2019-07-07 DIAGNOSIS — Z4801 Encounter for change or removal of surgical wound dressing: Secondary | ICD-10-CM | POA: Diagnosis present

## 2019-07-07 DIAGNOSIS — Z5321 Procedure and treatment not carried out due to patient leaving prior to being seen by health care provider: Secondary | ICD-10-CM | POA: Insufficient documentation

## 2019-07-07 NOTE — ED Triage Notes (Signed)
Patient reports he had a bump on his nostril that he scratched at 1900 yesterday and has been bleeding since. Patient reports he has used an entire box of bandaids. Bleeding controlled at this time. Bright red blood noted on bandaid - fresh bandage applied.

## 2019-07-10 ENCOUNTER — Other Ambulatory Visit: Payer: Self-pay

## 2019-07-10 ENCOUNTER — Ambulatory Visit (INDEPENDENT_AMBULATORY_CARE_PROVIDER_SITE_OTHER): Payer: Medicaid Other | Admitting: *Deleted

## 2019-07-10 DIAGNOSIS — E349 Endocrine disorder, unspecified: Secondary | ICD-10-CM

## 2019-07-10 MED ORDER — TESTOSTERONE CYPIONATE 200 MG/ML IM SOLN
200.0000 mg | Freq: Once | INTRAMUSCULAR | Status: AC
  Administered 2019-07-10: 200 mg via INTRAMUSCULAR

## 2019-07-10 NOTE — Progress Notes (Signed)
Testosterone IM Injection  Due to Hypogonadism patient is present today for a Testosterone Injection.  Medication: Testosterone Cypionate Dose: 1ml Location: right upper outer buttocks Lot: [id number] Exp:062022  Patient tolerated well, no complications were noted  Preformed by: Mayuri Staples   Follow up: 2 weeks  

## 2019-07-19 DIAGNOSIS — L989 Disorder of the skin and subcutaneous tissue, unspecified: Secondary | ICD-10-CM | POA: Insufficient documentation

## 2019-08-07 ENCOUNTER — Other Ambulatory Visit: Payer: Self-pay

## 2019-08-07 ENCOUNTER — Ambulatory Visit (INDEPENDENT_AMBULATORY_CARE_PROVIDER_SITE_OTHER): Payer: Medicaid Other | Admitting: *Deleted

## 2019-08-07 DIAGNOSIS — E349 Endocrine disorder, unspecified: Secondary | ICD-10-CM

## 2019-08-07 MED ORDER — TESTOSTERONE CYPIONATE 200 MG/ML IM SOLN
200.0000 mg | Freq: Once | INTRAMUSCULAR | Status: AC
  Administered 2019-08-07: 200 mg via INTRAMUSCULAR

## 2019-08-07 NOTE — Progress Notes (Signed)
Due to Hypogonadism patient is present today for a Testosterone Injection.  Medication: Testosterone Cypionate Dose:67ml Location:rightupper outer buttocks OLI:[id number] Exp:01/2021  Patient tolerated well,no complications were noted  Preformed HO:[id number] Armon Orvis  Follow up:2 weeks, patient was told today he will need refills next visit.

## 2019-08-21 ENCOUNTER — Other Ambulatory Visit: Payer: Self-pay | Admitting: *Deleted

## 2019-08-21 ENCOUNTER — Other Ambulatory Visit: Payer: Self-pay

## 2019-08-21 ENCOUNTER — Ambulatory Visit (INDEPENDENT_AMBULATORY_CARE_PROVIDER_SITE_OTHER): Payer: Medicaid Other | Admitting: *Deleted

## 2019-08-21 DIAGNOSIS — E291 Testicular hypofunction: Secondary | ICD-10-CM

## 2019-08-21 MED ORDER — TESTOSTERONE CYPIONATE 200 MG/ML IM SOLN
200.0000 mg | Freq: Once | INTRAMUSCULAR | Status: AC
  Administered 2019-08-21: 200 mg via INTRAMUSCULAR

## 2019-08-21 MED ORDER — TESTOSTERONE CYPIONATE 200 MG/ML IM SOLN: 200.0000 mg | INTRAMUSCULAR | 0 refills | Status: DC

## 2019-08-21 NOTE — Progress Notes (Signed)
Testosterone IM Injection  Due to Hypogonadism patient is present today for a Testosterone Injection.  Medication: Testosterone Cypionate Dose: 1ML Location: right upper outer buttocks Lot: [id number] Exp:5/22  Patient tolerated well, no complications were noted  Performed by: Verlene Mayer, CMA  Follow up: 2 weeks  Patient aware he needs to pick up and bring to the office RX next visit. Sent in refill.

## 2019-09-04 ENCOUNTER — Other Ambulatory Visit: Payer: Self-pay

## 2019-09-04 ENCOUNTER — Ambulatory Visit (INDEPENDENT_AMBULATORY_CARE_PROVIDER_SITE_OTHER): Payer: Medicaid Other | Admitting: Urology

## 2019-09-04 DIAGNOSIS — E349 Endocrine disorder, unspecified: Secondary | ICD-10-CM | POA: Diagnosis not present

## 2019-09-04 MED ORDER — TESTOSTERONE CYPIONATE 200 MG/ML IM SOLN
200.0000 mg | Freq: Once | INTRAMUSCULAR | Status: AC
  Administered 2019-09-04: 200 mg via INTRAMUSCULAR

## 2019-09-18 ENCOUNTER — Other Ambulatory Visit: Payer: Self-pay | Admitting: *Deleted

## 2019-09-18 ENCOUNTER — Ambulatory Visit (INDEPENDENT_AMBULATORY_CARE_PROVIDER_SITE_OTHER): Payer: Medicaid Other | Admitting: *Deleted

## 2019-09-18 ENCOUNTER — Other Ambulatory Visit: Payer: Self-pay

## 2019-09-18 DIAGNOSIS — E349 Endocrine disorder, unspecified: Secondary | ICD-10-CM

## 2019-09-18 DIAGNOSIS — N138 Other obstructive and reflux uropathy: Secondary | ICD-10-CM

## 2019-09-18 MED ORDER — TESTOSTERONE CYPIONATE 200 MG/ML IM SOLN
200.0000 mg | Freq: Once | INTRAMUSCULAR | Status: AC
  Administered 2019-09-18: 200 mg via INTRAMUSCULAR

## 2019-09-18 NOTE — Progress Notes (Signed)
  Testosterone IM Injection  Due to Hypogonadism patient is present today for a Testosterone Injection.  Medication: Testosterone Cypionate Dose:1ML Location:rightupper outer buttocks NKN:[id number]  Patient tolerated well,no complications were noted  Performed by: Ples Specter  CMA  Follow up:2 weeks

## 2019-09-26 ENCOUNTER — Other Ambulatory Visit: Payer: Self-pay

## 2019-09-26 ENCOUNTER — Other Ambulatory Visit: Payer: Medicaid Other

## 2019-09-26 DIAGNOSIS — N138 Other obstructive and reflux uropathy: Secondary | ICD-10-CM

## 2019-09-26 DIAGNOSIS — E349 Endocrine disorder, unspecified: Secondary | ICD-10-CM

## 2019-09-27 LAB — PSA: Prostate Specific Ag, Serum: 0.8 ng/mL (ref 0.0–4.0)

## 2019-09-27 LAB — HEMOGLOBIN AND HEMATOCRIT, BLOOD
Hematocrit: 45.9 % (ref 37.5–51.0)
Hemoglobin: 15.4 g/dL (ref 13.0–17.7)

## 2019-09-27 LAB — TESTOSTERONE: Testosterone: 355 ng/dL (ref 264–916)

## 2019-10-02 ENCOUNTER — Other Ambulatory Visit: Payer: Self-pay

## 2019-10-02 ENCOUNTER — Ambulatory Visit (INDEPENDENT_AMBULATORY_CARE_PROVIDER_SITE_OTHER): Payer: Medicaid Other | Admitting: *Deleted

## 2019-10-02 DIAGNOSIS — E349 Endocrine disorder, unspecified: Secondary | ICD-10-CM | POA: Diagnosis not present

## 2019-10-02 MED ORDER — TESTOSTERONE CYPIONATE 200 MG/ML IM SOLN
200.0000 mg | Freq: Once | INTRAMUSCULAR | Status: AC
  Administered 2019-10-02: 09:00:00 200 mg via INTRAMUSCULAR

## 2019-10-02 NOTE — Progress Notes (Signed)
Testosterone IM Injection  Due to Hypogonadism patient is present today for a Testosterone Injection.  Medication: Testosterone Cypionate Dose:1ML Location:left upper outer buttocks BWG:[id number]  Patient tolerated well,no complications were noted  Performed ES:[id number] QuallsCMA  Follow up:2 weeks

## 2019-10-04 ENCOUNTER — Ambulatory Visit (INDEPENDENT_AMBULATORY_CARE_PROVIDER_SITE_OTHER): Payer: Medicaid Other | Admitting: Urology

## 2019-10-04 ENCOUNTER — Other Ambulatory Visit: Payer: Self-pay | Admitting: Family Medicine

## 2019-10-04 ENCOUNTER — Telehealth: Payer: Self-pay | Admitting: Urology

## 2019-10-04 ENCOUNTER — Encounter: Payer: Self-pay | Admitting: Urology

## 2019-10-04 ENCOUNTER — Other Ambulatory Visit: Payer: Self-pay

## 2019-10-04 VITALS — BP 156/102 | HR 74 | Ht 70.0 in | Wt 245.0 lb

## 2019-10-04 DIAGNOSIS — N138 Other obstructive and reflux uropathy: Secondary | ICD-10-CM

## 2019-10-04 DIAGNOSIS — E349 Endocrine disorder, unspecified: Secondary | ICD-10-CM

## 2019-10-04 DIAGNOSIS — N5201 Erectile dysfunction due to arterial insufficiency: Secondary | ICD-10-CM | POA: Diagnosis not present

## 2019-10-04 DIAGNOSIS — N401 Enlarged prostate with lower urinary tract symptoms: Secondary | ICD-10-CM

## 2019-10-04 MED ORDER — NONFORMULARY OR COMPOUNDED ITEM: 3 refills | Status: DC

## 2019-10-04 NOTE — Telephone Encounter (Signed)
PCP says Walter Hodges hasn't been seen since 2017.  He needs to make an appointment with them to discuss his HTN.

## 2019-10-04 NOTE — Telephone Encounter (Signed)
He said he is going to a lady Dr at the [facility]. He states he has been there in the last 6 months

## 2019-10-04 NOTE — Telephone Encounter (Signed)
Would you please send in Trimix (30 mg PAPA, 1 mL phen, 50 mg prostaglandin) for the patient?  He needs to inject 8 to 10 mcg for erections.

## 2019-10-04 NOTE — Progress Notes (Signed)
11/15/2018 8:30 AM   Walter Hodges 02/10/1956 [id number]  Referring provider: Hoy Register, MD [address] [place],  [place] [address]  Chief Complaint  Patient presents with  . Follow-up    HPI: Walter Hodges is a 64 y.o. male with testosterone deficiency, erectile dysfunction and BPH with LU TS who presents for a follow up.    Testosterone deficiency He is no longer having spontaneous erections at night. He is currently managing his testosterone deficiency with testosterone cypionate 200 mg/IM, 1 cc every two weeks.  His current testosterone level is 355 ng/dL on 30/03/6225.   Component     Latest Ref Rng & Units 09/26/2019  Hemoglobin     13.0 - 17.7 g/dL 33.3  HCT     54.5 - 62.5 % 45.9    Erectile dysfunction SHIM score: 10     Main complaint: Maintaining an erection x several years Risk factors:  age, BPH, testosterone deficiency and  HTN.   No painful erections or curvatures with his erections.    No longer having spontaneous erections Currently using Super Trimix 7 mcg   SHIM    Row Name 10/04/19 0826         SHIM: Over the last 6 months:   How do you rate your confidence that you could get and keep an erection?  Very Low     When you had erections with sexual stimulation, how often were your erections hard enough for penetration (entering your partner)?  A Few Times (much less than half the time)     During sexual intercourse, how often were you able to maintain your erection after you had penetrated (entered) your partner?  A Few Times (much less than half the time)     During sexual intercourse, how difficult was it to maintain your erection to completion of intercourse?  Very Difficult     When you attempted sexual intercourse, how often was it satisfactory for you?  Sometimes (about half the time)       SHIM Total Score   SHIM  10        Score: 1-7 Severe ED 8-11 Moderate ED 12-16 Mild-Moderate ED 17-21 Mild ED 22-25 No ED  BPH  WITH LUTS  (prostate and/or bladder) I PSS score: 7/2     Previous IPSS score: 6/3        Major complaint(s):  Frequency and nocturia - which are baseline.  Denies any dysuria, hematuria or suprapubic pain.   Denies any recent fevers, chills, nausea or vomiting.  He does not have a family history of PCa.  IPSS    Row Name 10/04/19 0800         International Prostate Symptom Score   How often have you had the sensation of not emptying your bladder?  Less than 1 in 5     How often have you had to urinate less than every two hours?  Less than half the time     How often have you found you stopped and started again several times when you urinated?  Not at All     How often have you found it difficult to postpone urination?  Not at All     How often have you had a weak urinary stream?  Less than 1 in 5 times     How often have you had to strain to start urination?  Not at All     How many times did you  typically get up at night to urinate?  3 Times     Total IPSS Score  7       Quality of Life due to urinary symptoms   If you were to spend the rest of your life with your urinary condition just the way it is now how would you feel about that?  Mostly Satisfied        Score:  1-7 Mild 8-19 Moderate 20-35 Severe     PMH: Past Medical History:  Diagnosis Date  . Arthritis   . Bell's palsy 2010   lasted one day ago, told that he had a ministroke  . Headache   . Hollenhorst plaque, right eye   . Hypertension    followed by Dr. Velta Addison" with [facility]      Surgical History: Past Surgical History:  Procedure Laterality Date  . CIRCUMCISION     done as a child- given inhalation   . JOINT REPLACEMENT Bilateral   . KNEE CLOSED REDUCTION Left 03/19/2016   Procedure: CLOSED MANIPULATION LEFT KNEE;  Surgeon: Leandrew Koyanagi, MD;  Location: [facility];  Service: Orthopedics;  Laterality: Left;  . TEE WITHOUT CARDIOVERSION N/A 02/19/2016   Procedure: TRANSESOPHAGEAL ECHOCARDIOGRAM (TEE);   Surgeon: Sanda Klein, MD;  Location: [facility];  Service: Cardiovascular;  Laterality: N/A;  . TOTAL KNEE ARTHROPLASTY Right 05/26/2015   Procedure: RIGHT TOTAL KNEE ARTHROPLASTY;  Surgeon: Leandrew Koyanagi, MD;  Location: [facility];  Service: Orthopedics;  Laterality: Right;  . TOTAL KNEE ARTHROPLASTY Left 01/01/2016   Procedure: LEFT TOTAL KNEE ARTHROPLASTY;  Surgeon: Leandrew Koyanagi, MD;  Location: [facility];  Service: Orthopedics;  Laterality: Left;    Home Medications:  Allergies as of 10/04/2019      Reactions   No Known Allergies       Medication List       Accurate as of October 04, 2019  8:30 AM. If you have any questions, ask your nurse or doctor.        Amitiza 24 MCG capsule Generic drug: lubiprostone   amLODipine 10 MG tablet Commonly known as: NORVASC   diclofenac sodium 1 % Gel Commonly known as: VOLTAREN APP 2 GRAMS EXT AA QID   ketoconazole 2 % cream Commonly known as: NIZORAL Apply 1 application topically daily.   NONFORMULARY OR COMPOUNDED ITEM Trimix (30/1/50)-(Pap/Phent/PGE) Dosage: Inject 7 cc per injection 10cc size vial Qty1 Refills [address] [place] [phone] Fax [phone]   oxyCODONE-acetaminophen 10-325 MG tablet Commonly known as: PERCOCET TK 1 T PO FID PRF PAIN   sildenafil 20 MG tablet Commonly known as: REVATIO Take 1-5 tablets as needed 30 minutes prior to sexual activity.   testosterone cypionate 200 MG/ML injection Commonly known as: DEPOTESTOSTERONE CYPIONATE Inject 1 mL (200 mg total) into the muscle every 14 (fourteen) days.       Allergies:  Allergies  Allergen Reactions  . No Known Allergies     Family History: Family History  Problem Relation Age of Onset  . Headache Neg Hx     Social History:  reports that he has never smoked. He has never used smokeless tobacco. He reports that he does not drink alcohol or use drugs.  ROS: UROLOGY Frequent Urination?: No Hard to postpone urination?: No Burning/pain  with urination?: No Get up at night to urinate?: No Leakage of urine?: No Urine stream starts and stops?: No Trouble starting stream?: No Do you have to strain to urinate?: No Blood in urine?: No  Urinary tract infection?: No Sexually transmitted disease?: No Injury to kidneys or bladder?: No Painful intercourse?: No Weak stream?: No Erection problems?: No Penile pain?: No  Gastrointestinal Nausea?: No Vomiting?: No Indigestion/heartburn?: No Diarrhea?: No Constipation?: No  Constitutional Fever: No Night sweats?: No Weight loss?: No Fatigue?: No  Skin Skin rash/lesions?: No Itching?: No  Eyes Blurred vision?: No Double vision?: No  Ears/Nose/Throat Sore throat?: No Sinus problems?: No  Hematologic/Lymphatic Swollen glands?: No Easy bruising?: No  Cardiovascular Leg swelling?: No Chest pain?: No  Respiratory Cough?: No Shortness of breath?: No  Endocrine Excessive thirst?: No  Musculoskeletal Back pain?: No Joint pain?: No  Neurological Headaches?: No Dizziness?: No  Psychologic Depression?: No Anxiety?: No  Physical Exam: BP (!) 91/51   Pulse 74   Ht 5\' 10"  (1.778 m)   Wt 245 lb (111.1 kg)   BMI 35.15 kg/m   Constitutional:  Well nourished. Alert and oriented, No acute distress. HEENT: [place] AT, mask below chin.  Trachea midline, no masses. Cardiovascular: No clubbing, cyanosis, or edema. Respiratory: Normal respiratory effort, no increased work of breathing. GI: Abdomen is soft, non tender, non distended, no abdominal masses. Liver and spleen not palpable.  No hernias appreciated.  Stool sample for occult testing is not indicated.   GU: No CVA tenderness.  No bladder fullness or masses.  Patient with circumcised phallus.  Urethral meatus is patent.  No penile discharge. No penile lesions or rashes. Scrotum without lesions, cysts, rashes and/or edema.  Testicles are located scrotally bilaterally. No masses are appreciated in the testicles.  Left and right epididymis are normal. Rectal: Patient with  normal sphincter tone. Anus and perineum without scarring or rashes. No rectal masses are appreciated. Prostate is approximately 50+ grams, could only palpate the apex, no nodules are appreciated. Seminal vesicles could not be palpated.  Skin: No rashes, bruises or suspicious lesions. Lymph: No inguinal adenopathy. Neurologic: Grossly intact, no focal deficits, moving all 4 extremities. Psychiatric: Normal mood and affect.   Laboratory Data:  Lab Results  Component Value Date   TESTOSTERONE 355 09/26/2019   PSA Trend Component     Latest Ref Rng & Units 12/26/2017 09/27/2018 04/03/2019 09/26/2019  Prostate Specific Ag, Serum     0.0 - 4.0 ng/mL 3.1 1.0 1.0 0.8  I have reviewed the labs.  Assessment & Plan:    1. Testosterone deficiency  Most recent testosterone level is 355 ng/dL on 09/28/2019 (goal 02/2693 ng/dL) Continue testosterone cypionate 200 mg/IM, 1 cc every two weeks RTC in 6 months for HCT/HBG, testosterone (one week after injection) and exam  2. BPH with LUTS IPSS score is 7/2, it is slightly worse Continue conservative management, avoiding bladder irritants and timed voiding's RTC in 6 months for IPSS, PSA and exam, as testosterone therapy can cause prostate enlargement and worsen LUTS  3. Erectile dysfunction:    SHIM score: 10 Will increase the Super Trimix to 8 to 10 mcg with each injection RTC in 6 months for SHIM and exam   Advised patient of the condition of priapism, painful erection lasting for more than four hours, and to contact the office immediately or seek treatment in the ED  Return in about 6 months (around 04/02/2020) for PSA, testosterone (one week after injection) H & H, I PSS and exam .  04/04/2020, PA-C  [facility] [address] [place], [place] [phone]

## 2019-10-10 ENCOUNTER — Other Ambulatory Visit: Payer: Self-pay | Admitting: Family Medicine

## 2019-10-10 MED ORDER — NONFORMULARY OR COMPOUNDED ITEM: 3 refills | Status: DC

## 2019-10-15 ENCOUNTER — Other Ambulatory Visit: Payer: Self-pay

## 2019-10-15 MED ORDER — NONFORMULARY OR COMPOUNDED ITEM: 3 refills | Status: DC

## 2019-10-16 ENCOUNTER — Ambulatory Visit: Payer: Medicaid Other

## 2019-10-16 ENCOUNTER — Other Ambulatory Visit: Payer: Self-pay

## 2019-10-17 ENCOUNTER — Ambulatory Visit (INDEPENDENT_AMBULATORY_CARE_PROVIDER_SITE_OTHER): Payer: Medicaid Other | Admitting: Family Medicine

## 2019-10-17 DIAGNOSIS — E349 Endocrine disorder, unspecified: Secondary | ICD-10-CM | POA: Diagnosis not present

## 2019-10-17 MED ORDER — TESTOSTERONE CYPIONATE 200 MG/ML IM SOLN
200.0000 mg | Freq: Once | INTRAMUSCULAR | Status: AC
  Administered 2019-10-17: 200 mg via INTRAMUSCULAR

## 2019-10-17 NOTE — Progress Notes (Signed)
Testosterone IM Injection  Due to Hypogonadism patient is present today for a Testosterone Injection.  Medication: Testosterone Cypionate Dose:1ML Location:left upper outer buttocks ION:[id number] LKG:[id number]  Patient tolerated well,no complications were noted  Performed OZ:DGUYQI Rinaldo Ratel  CMA  Follow up:2 weeks

## 2019-10-30 ENCOUNTER — Other Ambulatory Visit: Payer: Self-pay

## 2019-10-30 ENCOUNTER — Ambulatory Visit (INDEPENDENT_AMBULATORY_CARE_PROVIDER_SITE_OTHER): Payer: Medicaid Other | Admitting: Family Medicine

## 2019-10-30 DIAGNOSIS — E349 Endocrine disorder, unspecified: Secondary | ICD-10-CM | POA: Diagnosis not present

## 2019-10-30 MED ORDER — TESTOSTERONE CYPIONATE 200 MG/ML IM SOLN
200.0000 mg | Freq: Once | INTRAMUSCULAR | Status: AC
  Administered 2019-10-30: 200 mg via INTRAMUSCULAR

## 2019-10-30 NOTE — Progress Notes (Signed)
Testosterone IM Injection  Due to Hypogonadism patient is present today for a Testosterone Injection.  Medication: Testosterone Cypionate Dose: 31ml Location: right upper outer buttocks Lot: [id number] Exp:04/2021  Patient tolerated well, no complications were noted  Preformed by: Teressa Lower, CMA  Follow up: 2 weeks

## 2019-10-31 ENCOUNTER — Ambulatory Visit: Payer: Self-pay

## 2019-11-13 ENCOUNTER — Ambulatory Visit: Payer: Medicare Other

## 2019-11-13 ENCOUNTER — Encounter: Payer: Self-pay | Admitting: Urology

## 2019-11-15 ENCOUNTER — Encounter: Payer: Self-pay | Admitting: Urology

## 2019-11-19 ENCOUNTER — Ambulatory Visit: Payer: Medicare Other

## 2019-11-22 ENCOUNTER — Other Ambulatory Visit: Payer: Self-pay

## 2019-11-22 ENCOUNTER — Other Ambulatory Visit: Payer: Self-pay | Admitting: *Deleted

## 2019-11-22 ENCOUNTER — Ambulatory Visit: Payer: Medicare Other

## 2019-11-22 DIAGNOSIS — E291 Testicular hypofunction: Secondary | ICD-10-CM

## 2019-11-22 MED ORDER — TESTOSTERONE CYPIONATE 200 MG/ML IM SOLN: 200.0000 mg | INTRAMUSCULAR | 0 refills | Status: DC

## 2019-11-26 ENCOUNTER — Ambulatory Visit: Payer: Medicare Other

## 2019-12-03 ENCOUNTER — Other Ambulatory Visit: Payer: Self-pay

## 2019-12-03 ENCOUNTER — Ambulatory Visit (INDEPENDENT_AMBULATORY_CARE_PROVIDER_SITE_OTHER): Payer: Medicare Other | Admitting: Physician Assistant

## 2019-12-03 DIAGNOSIS — E349 Endocrine disorder, unspecified: Secondary | ICD-10-CM | POA: Diagnosis not present

## 2019-12-03 MED ORDER — TESTOSTERONE CYPIONATE 200 MG/ML IM SOLN
200.0000 mg | Freq: Once | INTRAMUSCULAR | Status: AC
  Administered 2019-12-03: 200 mg via INTRAMUSCULAR

## 2019-12-03 NOTE — Progress Notes (Signed)
Testosterone IM Injection  Due to Hypogonadism patient is present today for a Testosterone Injection.  Medication: Testosterone Cypionate Dose: 1 ML Location: right upper outer buttocks Lot: [id number] Exp:05/2021  Patient tolerated well, no complications were noted  Preformed by: Gerarda Gunther, RMA.  Follow up: 12/17/2019

## 2019-12-10 ENCOUNTER — Other Ambulatory Visit: Payer: Self-pay

## 2019-12-10 ENCOUNTER — Emergency Department
Admission: EM | Admit: 2019-12-10 | Discharge: 2019-12-10 | Disposition: A | Discharge status: Home / Self Care | Payer: Medicare HMO | Attending: Emergency Medicine | Admitting: Emergency Medicine

## 2019-12-10 DIAGNOSIS — Z79899 Other long term (current) drug therapy: Secondary | ICD-10-CM | POA: Insufficient documentation

## 2019-12-10 DIAGNOSIS — I10 Essential (primary) hypertension: Secondary | ICD-10-CM | POA: Diagnosis not present

## 2019-12-10 DIAGNOSIS — L03115 Cellulitis of right lower limb: Secondary | ICD-10-CM | POA: Diagnosis not present

## 2019-12-10 DIAGNOSIS — L03116 Cellulitis of left lower limb: Secondary | ICD-10-CM | POA: Diagnosis not present

## 2019-12-10 DIAGNOSIS — Z96653 Presence of artificial knee joint, bilateral: Secondary | ICD-10-CM | POA: Insufficient documentation

## 2019-12-10 DIAGNOSIS — L299 Pruritus, unspecified: Secondary | ICD-10-CM | POA: Diagnosis present

## 2019-12-10 DIAGNOSIS — L03119 Cellulitis of unspecified part of limb: Secondary | ICD-10-CM

## 2019-12-10 DIAGNOSIS — R21 Rash and other nonspecific skin eruption: Secondary | ICD-10-CM

## 2019-12-10 LAB — COMPREHENSIVE METABOLIC PANEL
ALT: 24 U/L (ref 0–44)
AST: 22 U/L (ref 15–41)
Albumin: 4 g/dL (ref 3.5–5.0)
Alkaline Phosphatase: 67 U/L (ref 38–126)
Anion gap: 7 (ref 5–15)
BUN: 11 mg/dL (ref 8–23)
CO2: 30 mmol/L (ref 22–32)
Calcium: 9 mg/dL (ref 8.9–10.3)
Chloride: 104 mmol/L (ref 98–111)
Creatinine, Ser: 0.76 mg/dL (ref 0.61–1.24)
GFR calc Af Amer: >60 mL/min (ref >60)
GFR calc non Af Amer: >60 mL/min (ref >60)
Glucose, Bld: 105 mg/dL — ABNORMAL HIGH (ref 70–99)
Potassium: 4 mmol/L (ref 3.5–5.1)
Sodium: 141 mmol/L (ref 135–145)
Total Bilirubin: 0.8 mg/dL (ref 0.3–1.2)
Total Protein: 6.8 g/dL (ref 6.5–8.1)

## 2019-12-10 LAB — CBC WITH DIFFERENTIAL/PLATELET
Abs Immature Granulocytes: 0.01 10*3/uL (ref 0.00–0.07)
Basophils Absolute: 0.1 10*3/uL (ref 0.0–0.1)
Basophils Relative: 1 %
Eosinophils Absolute: 0.2 10*3/uL (ref 0.0–0.5)
Eosinophils Relative: 3 %
HCT: 46.9 % (ref 39.0–52.0)
Hemoglobin: 15.4 g/dL (ref 13.0–17.0)
Immature Granulocytes: 0 %
Lymphocytes Relative: 25 %
Lymphs Abs: 2 10*3/uL (ref 0.7–4.0)
MCH: 28.5 pg (ref 26.0–34.0)
MCHC: 32.8 g/dL (ref 30.0–36.0)
MCV: 86.9 fL (ref 80.0–100.0)
Monocytes Absolute: 0.9 10*3/uL (ref 0.1–1.0)
Monocytes Relative: 11 %
Neutro Abs: 5.1 10*3/uL (ref 1.7–7.7)
Neutrophils Relative %: 60 %
Platelets: 273 10*3/uL (ref 150–400)
RBC: 5.4 MIL/uL (ref 4.22–5.81)
RDW: 13.3 % (ref 11.5–15.5)
WBC: 8.3 10*3/uL (ref 4.0–10.5)
nRBC: 0 % (ref 0.0–0.2)

## 2019-12-10 MED ORDER — CLINDAMYCIN PHOSPHATE 600 MG/50ML IV SOLN
600.0000 mg | Freq: Once | INTRAVENOUS | Status: AC
  Administered 2019-12-10: 600 mg via INTRAVENOUS
  Filled 2019-12-10: qty 50

## 2019-12-10 MED ORDER — CEPHALEXIN 500 MG PO CAPS: 500.0000 mg | ORAL_CAPSULE | Freq: Three times a day (TID) | ORAL | 0 refills | Status: DC

## 2019-12-10 MED ORDER — PREDNISONE 10 MG PO TABS: ORAL_TABLET | ORAL | 0 refills | Status: DC

## 2019-12-10 MED ORDER — DEXAMETHASONE SODIUM PHOSPHATE 10 MG/ML IJ SOLN
10.0000 mg | Freq: Once | INTRAMUSCULAR | Status: AC
  Administered 2019-12-10: 10:00:00 10 mg via INTRAVENOUS
  Filled 2019-12-10: qty 1

## 2019-12-10 MED ORDER — SULFAMETHOXAZOLE-TRIMETHOPRIM 800-160 MG PO TABS: 1.0000 | ORAL_TABLET | Freq: Two times a day (BID) | ORAL | 0 refills | Status: DC

## 2019-12-10 NOTE — ED Triage Notes (Signed)
Pt c/o "insect bite" on BL upper thigh for the past week with swelling and pain.

## 2019-12-10 NOTE — ED Provider Notes (Signed)
[facility] Emergency Department Provider Note   ____________________________________________   First MD Initiated Contact with Patient 12/10/19 [phone]     (approximate)  I have reviewed the triage vital signs and the nursing notes.   HISTORY  Chief Complaint Abscess   HPI Walter Hodges is a 64 y.o. male presents to the ED with complaint of possible insect bites to his upper thighs.  Patient states that he began itching approximately 3 to 4 days ago.  He denies any known exposure to poison oak or poison ivy.  He states he does not go outside and has not been working around areas that he could have gotten a contact dermatitis.  He believes that he was bitten by some type of insect although he did not see it specifically.  Patient has continued to scratch the areas that has caused a lot of itching.  He is used over-the-counter medications without any relief.  He denies any fever, chills, nausea or vomiting.  Currently rates pain as 7/10.     Past Medical History:  Diagnosis Date  . Arthritis   . Bell's palsy 2010   lasted one day ago, told that he had a ministroke  . Headache   . Hollenhorst plaque, right eye   . Hypertension    followed by Dr. Nita Sells" with [facility]      Patient Active Problem List   Diagnosis Date Noted  . Acute pain of right shoulder 07/05/2016  . 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 Chronic pain of both knees 03/19/2016  . Embolism involving retinal artery   . Insomnia 02/16/2016  . Hollenhorst plaque, right eye 02/16/2016  . Broken tooth, complicated 01/23/2016  . Hypertension 01/07/2016  . Total knee replacement status 01/01/2016  . Primary osteoarthritis of right knee 05/26/2015    Past Surgical History:  Procedure Laterality Date  . CIRCUMCISION     done as a child- given inhalation   . JOINT REPLACEMENT Bilateral   . KNEE CLOSED REDUCTION Left 03/19/2016   Procedure:  CLOSED MANIPULATION LEFT KNEE;  Surgeon: Tarry Kos, MD;  [facility];  Service: Orthopedics;  Laterality: Left;  . TEE WITHOUT CARDIOVERSION N/A 02/19/2016   Procedure: TRANSESOPHAGEAL ECHOCARDIOGRAM (TEE);  Surgeon: Thurmon Fair, MD;  Location: [facility]COPY;  Service: Cardiovascular;  Laterality: N/A;  . TOTAL KNEE ARTHROPLASTY Right 05/26/2015   Procedure: RIGHT TOTAL KNEE ARTHROPLASTY;  Surgeon: Tarry Kos, MD;  Location: [facility];  Service: Orthopedics;  Laterality: Right;  . TOTAL KNEE ARTHROPLASTY Left 01/01/2016   Procedure: LEFT TOTAL KNEE ARTHROPLASTY;  Surgeon: Tarry Kos, MD;  Location: [facility];  Service: Orthopedics;  Laterality: Left;    Prior to Admission medications   Medication Sig Start Date End Date Taking? Authorizing Provider  AMITIZA 24 MCG capsule  11/17/18   [provider]  amLODipine (NORVASC) 10 MG tablet  12/07/16   [provider]  cephALEXin (KEFLEX) 500 MG capsule Take 1 capsule (500 mg total) by mouth 3 (three) times daily. 12/10/19   Tommi Rumps, PA-C  diclofenac sodium (VOLTAREN) 1 % GEL APP 2 GRAMS EXT AA QID 03/30/19   [provider]  ketoconazole (NIZORAL) 2 % cream Apply 1 application topically daily. 10/22/18   Orvil Feil, PA-C  NONFORMULARY OR COMPOUNDED ITEM Trimix (30/1/50)-(Pap/Phent/PGE) Dosage: Inject 0.8-1 cc per injection 10 1cc vials Qty1 Refills 3 [facility] [phone] Fax [phone] 10/15/19   Marvel Plan,  Larene Beach A, PA-C  oxyCODONE-acetaminophen (PERCOCET) 10-325 MG tablet TK 1 T PO FID PRF PAIN 04/18/19   [provider]  predniSONE (DELTASONE) 10 MG tablet Take 6 tablets  today, on day 2 take 5 tablets, day 3 take 4 tablets, day 4 take 3 tablets, day 5 take  2 tablets and 1 tablet the last day 12/10/19   Johnn Hai, PA-C  sildenafil (REVATIO) 20 MG tablet Take 1-5 tablets as needed 30 minutes prior to sexual activity. 09/20/18   Stoioff, Ronda Fairly, MD  sulfamethoxazole-trimethoprim  (BACTRIM DS) 800-160 MG tablet Take 1 tablet by mouth 2 (two) times daily. 12/10/19   Johnn Hai, PA-C  testosterone cypionate (DEPOTESTOSTERONE CYPIONATE) 200 MG/ML injection Inject 1 mL (200 mg total) into the muscle every 14 (fourteen) days. 11/22/19   Zara Council A, PA-C    Allergies No known allergies  Family History  Problem Relation Age of Onset  . Headache Neg Hx     Social History Social History   Tobacco Use  . Smoking status: Never Smoker  . Smokeless tobacco: Never Used  Substance Use Topics  . Alcohol use: No    Comment: Socially in the past  . Drug use: No    Review of Systems Constitutional: No fever/chills Cardiovascular: Denies chest pain. Respiratory: Denies shortness of breath. Gastrointestinal:   No nausea, no vomiting.  Musculoskeletal: Negative for muscle aches. Skin: Positive for rash. Neurological: Negative for headaches, focal weakness or numbness. ____________________________________________   PHYSICAL EXAM:  VITAL SIGNS: ED Triage Vitals  Enc Vitals Group     BP 12/10/19 0820 131/80     Pulse Rate 12/10/19 0820 72     Resp 12/10/19 0820 16     Temp 12/10/19 0819 97.8 F (36.6 C)     Temp Source 12/10/19 0820 Oral     SpO2 12/10/19 0820 100 %     Weight 12/10/19 0817 240 lb (108.9 kg)     Height 12/10/19 0817 5\' 10"  (1.778 m)     Head Circumference --      Peak Flow --      Pain Score 12/10/19 0817 7     Pain Loc --      Pain Edu? --      Excl. in [place]? --    Constitutional: Alert and oriented. Well appearing and in no acute distress. Eyes: Conjunctivae are normal.  Head: Atraumatic. Neck: No stridor.   Cardiovascular: Normal rate, regular rhythm. Grossly normal heart sounds.  Good peripheral circulation. Respiratory: Normal respiratory effort.  No retractions. Lungs CTAB. Gastrointestinal: Soft and nontender. No distention.   Musculoskeletal: Moves upper and lower extremities no difficulty and patient is ambulatory  without assistance. Neurologic:  Normal speech and language. No gross focal neurologic deficits are appreciated. No gait instability. Skin:  Skin is warm.  Bilateral inner thighs there is an erythematous rash with macular area and vesicles noted.  Vesicles are open and are not actively draining.  Skin is warm to touch.  Areas involving the upper thigh medial aspect and also involving the left groin area without involvement of the scrotum. Psychiatric: Mood and affect are normal. Speech and behavior are normal.  ____________________________________________   LABS (all labs ordered are listed, but only abnormal results are displayed)  Labs Reviewed  COMPREHENSIVE METABOLIC PANEL - Abnormal; Notable for the following components:      Result Value   Glucose, Bld 105 (*)    All other components within normal limits  CBC WITH DIFFERENTIAL/PLATELET     PROCEDURES  Procedure(s) performed (including Critical Care):  Procedures   ____________________________________________   INITIAL IMPRESSION / ASSESSMENT AND PLAN / ED COURSE  As part of my medical decision making, I reviewed the following data within the electronic MEDICAL RECORD NUMBER Notes from prior ED visits and [place] Controlled Substance Database  63 year old male presents to the ED with a rash to his thighs bilaterally that has been going on for approximately 3 to 4 days.  Patient initially thought that this was an insect bite and has been using over-the-counter medication without any relief.  Patient was given Decadron 10 mg IV and got relief of his itching while in the ED.  Area was more consistent with a cellulitis which may have happened from patient scratching frequently.  He was given clindamycin 600 mg IV while in the ED.  A prescription for prednisone, Keflex and Bactrim DS was sent to his pharmacy.  Patient is instructed to return to the emergency department should he develop any fever, chills, nausea or vomiting.  If there is any  worsening he is also to return to the emergency department.  Otherwise he can follow-up with his PCP if any continued problems.  ____________________________________________   FINAL CLINICAL IMPRESSION(S) / ED DIAGNOSES  Final diagnoses:  Cellulitis of lower extremity, unspecified laterality  Rash/skin eruption     ED Discharge Orders         Ordered    predniSONE (DELTASONE) 10 MG tablet     12/10/19 1115    cephALEXin (KEFLEX) 500 MG capsule  3 times daily     12/10/19 1115    sulfamethoxazole-trimethoprim (BACTRIM DS) 800-160 MG tablet  2 times daily     12/10/19 1115           Note:  This document was prepared using Dragon voice recognition software and may include unintentional dictation errors.    Tommi Rumps, PA-C 12/10/19 1430    Chesley Noon, MD 12/11/19 1501

## 2019-12-10 NOTE — Discharge Instructions (Signed)
Follow-up with your primary care provider if any continued problems or concerns.  Begin taking the antibiotics there are 2 for the next 7 days.  Also prednisone is a 6-day taper beginning with 6 tablets today and tapering by 1 tablet each day over the next 6 days.  This will help with itching and reduce the inflammation.  You may continue taking Benadryl as needed for itching with this medication.  If you develop any fever, chills, nausea or vomiting return to the emergency department.

## 2019-12-10 NOTE — ED Notes (Signed)
See triage note  Presents with possible insect bites to inside of both upper thighs  States he noticed itching about 3-4 days ago

## 2019-12-17 ENCOUNTER — Encounter: Payer: Self-pay | Admitting: Urology

## 2019-12-17 ENCOUNTER — Ambulatory Visit: Payer: Medicare HMO

## 2019-12-18 ENCOUNTER — Ambulatory Visit (INDEPENDENT_AMBULATORY_CARE_PROVIDER_SITE_OTHER): Payer: Medicare HMO | Admitting: Urology

## 2019-12-18 ENCOUNTER — Other Ambulatory Visit: Payer: Self-pay

## 2019-12-18 DIAGNOSIS — E349 Endocrine disorder, unspecified: Secondary | ICD-10-CM

## 2019-12-18 MED ORDER — TESTOSTERONE CYPIONATE 200 MG/ML IM SOLN
200.0000 mg | Freq: Once | INTRAMUSCULAR | Status: AC
  Administered 2019-12-18: 200 mg via INTRAMUSCULAR

## 2019-12-18 NOTE — Progress Notes (Signed)
Testosterone IM Injection  Due to Hypogonadism patient is present today for a Testosterone Injection.  Medication: Testosterone Cypionate Dose: 18mL Location: right upper outer buttocks Lot: [id number] Exp:05/2021  Patient tolerated well, no complications were noted  Preformed by: Franchot Erichsen, CMA  Follow up: 2 weeks

## 2019-12-21 ENCOUNTER — Ambulatory Visit: Payer: Medicare HMO | Attending: Internal Medicine

## 2019-12-21 ENCOUNTER — Other Ambulatory Visit: Payer: Self-pay

## 2019-12-21 DIAGNOSIS — Z23 Encounter for immunization: Secondary | ICD-10-CM

## 2019-12-21 NOTE — Progress Notes (Signed)
   Covid-19 Vaccination Clinic  Name:  KIP CROPP    MRN: [id number] DOB: 09/07/1955  12/21/2019  Mr. Cortright was observed post Covid-19 immunization for 15 minutes without incident. He was provided with Vaccine Information Sheet and instruction to access the V-Safe system.   Mr. Netter was instructed to call 911 with any severe reactions post vaccine: Marland Kitchen Difficulty breathing  . Swelling of face and throat  . A fast heartbeat  . A bad rash all over body  . Dizziness and weakness   Immunizations Administered    Name Date Dose VIS Date Route   Pfizer COVID-19 Vaccine 12/21/2019  8:24 AM 0.3 mL 08/17/2019 Intramuscular   Manufacturer: [employer], [employer]   Lot: [id number]   NDC: [address]-7

## 2019-12-28 ENCOUNTER — Other Ambulatory Visit: Payer: Self-pay | Admitting: Family

## 2019-12-28 DIAGNOSIS — R200 Anesthesia of skin: Secondary | ICD-10-CM

## 2020-01-01 ENCOUNTER — Other Ambulatory Visit: Payer: Self-pay

## 2020-01-01 ENCOUNTER — Telehealth: Payer: Self-pay

## 2020-01-01 ENCOUNTER — Ambulatory Visit: Payer: Medicare HMO

## 2020-01-01 NOTE — Telephone Encounter (Signed)
Mr Empson called this morning and left a message about not being able to pick up his testosterone for injections. I called the pharmacy and they informed me of patients insurance change from Medicaid to Medicare and Medicare needing a new PA for the testosterone. I also had the pharmacy fax another PA request. I returned Mr [facility] phone call and let him know that once the PA was approved, we would give him a call back and let him know.

## 2020-01-02 ENCOUNTER — Encounter: Payer: Self-pay | Admitting: Urology

## 2020-01-02 ENCOUNTER — Ambulatory Visit: Payer: Medicare HMO

## 2020-01-02 ENCOUNTER — Ambulatory Visit: Admission: RE | Admit: 2020-01-02 | Payer: Medicare HMO | Source: Ambulatory Visit

## 2020-01-02 NOTE — Telephone Encounter (Signed)
Prior Authorization has been approved Dates: 12/06/2019-08/26/2020 Patient notified and voiced understanding.

## 2020-01-07 ENCOUNTER — Ambulatory Visit: Payer: Medicare HMO

## 2020-01-10 DIAGNOSIS — M79676 Pain in unspecified toe(s): Secondary | ICD-10-CM | POA: Insufficient documentation

## 2020-01-10 DIAGNOSIS — Z0000 Encounter for general adult medical examination without abnormal findings: Secondary | ICD-10-CM | POA: Insufficient documentation

## 2020-01-10 DIAGNOSIS — I4891 Unspecified atrial fibrillation: Secondary | ICD-10-CM | POA: Insufficient documentation

## 2020-01-12 ENCOUNTER — Ambulatory Visit: Payer: Medicare HMO | Attending: Internal Medicine

## 2020-01-12 DIAGNOSIS — Z23 Encounter for immunization: Secondary | ICD-10-CM

## 2020-01-12 NOTE — Progress Notes (Signed)
   Covid-19 Vaccination Clinic  Name:  COLESTON DIROSA    MRN: [id number] DOB: [birth year]  01/12/2020  Mr. Mcmanamon was observed post Covid-19 immunization for 15 minutes without incident. He was provided with Vaccine Information Sheet and instruction to access the V-Safe system.   Mr. Douthit was instructed to call 911 with any severe reactions post vaccine: Marland Kitchen Difficulty breathing  . Swelling of face and throat  . A fast heartbeat  . A bad rash all over body  . Dizziness and weakness   Immunizations Administered    Name Date Dose VIS Date Route   Pfizer COVID-19 Vaccine 01/12/2020  8:26 AM 0.3 mL 10/31/2018 Intramuscular   Manufacturer: [employer], [employer]   Lot: [id number]   NDC: [address]-9

## 2020-01-14 ENCOUNTER — Other Ambulatory Visit: Payer: Self-pay

## 2020-01-14 ENCOUNTER — Ambulatory Visit (INDEPENDENT_AMBULATORY_CARE_PROVIDER_SITE_OTHER): Payer: Medicare HMO | Admitting: Family Medicine

## 2020-01-14 DIAGNOSIS — E349 Endocrine disorder, unspecified: Secondary | ICD-10-CM

## 2020-01-14 MED ORDER — TESTOSTERONE CYPIONATE 200 MG/ML IM SOLN
200.0000 mg | Freq: Once | INTRAMUSCULAR | Status: AC
  Administered 2020-01-14: 200 mg via INTRAMUSCULAR

## 2020-01-14 NOTE — Progress Notes (Signed)
Testosterone IM Injection  Due to Hypogonadism patient is present today for a Testosterone Injection.  Medication: Testosterone Cypionate Dose: 5ml Location: right upper outer buttocks YVO:[id number] Exp:06/06/2021  Patient tolerated well, no complications were noted  Preformed by: Teressa Lower, CMA  Follow up: 2 weeks injection

## 2020-01-28 ENCOUNTER — Ambulatory Visit: Payer: Self-pay

## 2020-01-29 ENCOUNTER — Ambulatory Visit (INDEPENDENT_AMBULATORY_CARE_PROVIDER_SITE_OTHER): Payer: Medicare HMO

## 2020-01-29 ENCOUNTER — Other Ambulatory Visit: Payer: Self-pay

## 2020-01-29 DIAGNOSIS — E349 Endocrine disorder, unspecified: Secondary | ICD-10-CM

## 2020-01-29 MED ORDER — TESTOSTERONE CYPIONATE 200 MG/ML IM SOLN
200.0000 mg | Freq: Once | INTRAMUSCULAR | Status: AC
  Administered 2020-01-29: 200 mg via INTRAMUSCULAR

## 2020-01-29 NOTE — Progress Notes (Signed)
Testosterone IM Injection  Due to Hypogonadism patient is present today for a Testosterone Injection.  Medication: Testosterone Cypionate Dose: 44mL Location: right upper outer buttocks Lot: [id number] Exp:06/2021  Patient tolerated well, no complications were noted.  Preformed by: Debbe Bales, CMA  Follow up: RTC in 2 weeks

## 2020-02-18 ENCOUNTER — Other Ambulatory Visit: Payer: Self-pay

## 2020-02-18 ENCOUNTER — Ambulatory Visit (INDEPENDENT_AMBULATORY_CARE_PROVIDER_SITE_OTHER): Payer: Medicare HMO

## 2020-02-18 DIAGNOSIS — E349 Endocrine disorder, unspecified: Secondary | ICD-10-CM

## 2020-02-18 MED ORDER — TESTOSTERONE CYPIONATE 200 MG/ML IM SOLN
200.0000 mg | Freq: Once | INTRAMUSCULAR | Status: AC
  Administered 2020-02-18: 200 mg via INTRAMUSCULAR

## 2020-02-18 NOTE — Progress Notes (Signed)
Testosterone IM Injection  Due to Hypogonadism patient is present today for a Testosterone Injection.  Medication: Testosterone Cypionate Dose: 21ml Location: right upper outer buttocks, per patient request Lot: [id number] Exp:07/2021  Patient tolerated well, no complications were noted  Preformed by: Eligha Bridegroom, CMA  Follow up: 2wk

## 2020-03-03 ENCOUNTER — Ambulatory Visit (INDEPENDENT_AMBULATORY_CARE_PROVIDER_SITE_OTHER): Payer: Medicare HMO

## 2020-03-03 ENCOUNTER — Other Ambulatory Visit: Payer: Self-pay

## 2020-03-03 DIAGNOSIS — E291 Testicular hypofunction: Secondary | ICD-10-CM

## 2020-03-03 MED ORDER — TESTOSTERONE CYPIONATE 200 MG/ML IM SOLN
200.0000 mg | Freq: Once | INTRAMUSCULAR | Status: AC
  Administered 2020-03-03: 200 mg via INTRAMUSCULAR

## 2020-03-03 NOTE — Progress Notes (Signed)
Testosterone IM Injection  Due to Hypogonadism patient is present today for a Testosterone Injection.  Medication: Testosterone Cypionate Dose: 71ml Location: left upper outer buttocks Lot: [id number] Exp:07/2021  Patient tolerated well, no complications were noted  Preformed by: Eligha Bridegroom, CMA  Follow up: 2wk injection, patient was notified he would need to bring new vials at next visit

## 2020-03-17 ENCOUNTER — Ambulatory Visit: Payer: Medicare HMO

## 2020-03-17 ENCOUNTER — Other Ambulatory Visit: Payer: Self-pay

## 2020-03-18 ENCOUNTER — Ambulatory Visit: Payer: Medicare HMO

## 2020-03-19 ENCOUNTER — Other Ambulatory Visit: Payer: Self-pay

## 2020-03-19 ENCOUNTER — Ambulatory Visit (INDEPENDENT_AMBULATORY_CARE_PROVIDER_SITE_OTHER): Payer: Medicare HMO

## 2020-03-19 DIAGNOSIS — E291 Testicular hypofunction: Secondary | ICD-10-CM | POA: Diagnosis not present

## 2020-03-19 MED ORDER — TESTOSTERONE CYPIONATE 200 MG/ML IM SOLN
200.0000 mg | Freq: Once | INTRAMUSCULAR | Status: AC
  Administered 2020-03-19: 200 mg via INTRAMUSCULAR

## 2020-03-19 NOTE — Progress Notes (Addendum)
Testosterone IM Injection  Due to Hypogonadism patient is present today for a Testosterone Injection.  Medication: Testosterone Cypionate Dose: 98ml Location: right upper outer buttocks Lot: [id number] Exp:08/2021  Patient tolerated well, no complications were noted  Preformed by: Eligha Bridegroom, CMA  Follow up: 2wks  **Patient's dose changing at next visit to 0.71ml

## 2020-03-24 ENCOUNTER — Other Ambulatory Visit: Payer: Self-pay

## 2020-03-24 ENCOUNTER — Telehealth: Payer: Self-pay

## 2020-03-24 DIAGNOSIS — E291 Testicular hypofunction: Secondary | ICD-10-CM

## 2020-03-24 DIAGNOSIS — E349 Endocrine disorder, unspecified: Secondary | ICD-10-CM

## 2020-03-24 NOTE — Telephone Encounter (Signed)
Error

## 2020-03-25 ENCOUNTER — Other Ambulatory Visit: Payer: Medicare HMO

## 2020-03-25 ENCOUNTER — Other Ambulatory Visit: Payer: Self-pay

## 2020-03-25 DIAGNOSIS — E291 Testicular hypofunction: Secondary | ICD-10-CM

## 2020-03-25 DIAGNOSIS — E349 Endocrine disorder, unspecified: Secondary | ICD-10-CM

## 2020-03-26 ENCOUNTER — Telehealth: Payer: Self-pay | Admitting: Family Medicine

## 2020-03-26 LAB — HEMATOCRIT: Hematocrit: 47.7 % (ref 37.5–51.0)

## 2020-03-26 LAB — TESTOSTERONE: Testosterone: 1269 ng/dL — ABNORMAL HIGH (ref 264–916)

## 2020-03-26 LAB — PSA: Prostate Specific Ag, Serum: 1.2 ng/mL (ref 0.0–4.0)

## 2020-03-26 LAB — HEMOGLOBIN: Hemoglobin: 15.8 g/dL (ref 13.0–17.7)

## 2020-03-26 NOTE — Telephone Encounter (Signed)
-----   Message from Harle Battiest, PA-C sent at 03/26/2020  7:37 AM EDT ----- Please let Mr. Knee know that his testosterone level is too high, we need to decrease his dose to 0.75 cc every 14 days and repeat his testosterone one week after his fourth injection.

## 2020-03-26 NOTE — Telephone Encounter (Signed)
Patient notified and voiced understanding. Appointment has been made.  

## 2020-04-02 NOTE — Progress Notes (Signed)
11/15/2018 9:28 AM   Walter Hodges Apr 10, 1956 [id number]  Referring provider: Hoy Register, MD [address] [place],  [place] [address]  Chief Complaint  Patient presents with  . Hypogonadism    HPI: Walter Hodges is a 64 y.o. male with testosterone deficiency, erectile dysfunction and BPH with LU TS who presents for a follow up.    Testosterone deficiency He is no longer having spontaneous erections at night.   He has not been diagnosed with sleep apnea.   He is currently managing his testosterone deficiency with testosterone cypionate 200 mg/IM, 1 cc every two weeks.   Component     Latest Ref Rng & Units 03/25/2020  Testosterone     264 - 916 ng/dL 2,130 (H)   Component     Latest Ref Rng & Units 03/25/2020  HCT     37.5 - 51.0 % 47.7   Component     Latest Ref Rng & Units 03/25/2020  Hemoglobin     13.0 - 17.7 g/dL 86.5    Erectile dysfunction SHIM: 13      Previous SHIM score: 10     Main complaint: Maintaining an erection x several years Risk factors:  age, BPH, testosterone deficiency and  HTN.   No painful erections or curvatures with his erections.    No longer having spontaneous erections Currently using Super Trimix 7 mcg    SHIM    Row Name 04/03/20 0855         SHIM: Over the last 6 months:   How do you rate your confidence that you could get and keep an erection? Low     When you had erections with sexual stimulation, how often were your erections hard enough for penetration (entering your partner)? Sometimes (about half the time)     During sexual intercourse, how often were you able to maintain your erection after you had penetrated (entered) your partner? Sometimes (about half the time)     During sexual intercourse, how difficult was it to maintain your erection to completion of intercourse? Very Difficult     When you attempted sexual intercourse, how often was it satisfactory for you? Sometimes (about half the time)       SHIM  Total Score   SHIM 13            Score: 1-7 Severe ED 8-11 Moderate ED 12-16 Mild-Moderate ED 17-21 Mild ED 22-25 No ED  BPH WITH LUTS  (prostate and/or bladder) I PSS score: 9/3    Previous IPSS score: 7/2        Major complaint(s):  Frequency and nocturia - which are baseline.  Denies any dysuria, hematuria or suprapubic pain.   Denies any recent fevers, chills, nausea or vomiting.  He does not have a family history of PCa.   IPSS    Row Name 04/03/20 0800         International Prostate Symptom Score   How often have you had the sensation of not emptying your bladder? About half the time     How often have you had to urinate less than every two hours? About half the time     How often have you found you stopped and started again several times when you urinated? Not at All     How often have you found it difficult to postpone urination? Not at All     How often have you had a weak urinary stream? Not at  All     How often have you had to strain to start urination? Not at All     How many times did you typically get up at night to urinate? 3 Times     Total IPSS Score 9       Quality of Life due to urinary symptoms   If you were to spend the rest of your life with your urinary condition just the way it is now how would you feel about that? Mixed            Score:  1-7 Mild 8-19 Moderate 20-35 Severe     PMH: Past Medical History:  Diagnosis Date  . Arthritis   . Bell's palsy 2010   lasted one day ago, told that he had a ministroke  . Headache   . Hollenhorst plaque, right eye   . Hypertension    followed by Dr. Nita Sells" with [facility]      Surgical History: Past Surgical History:  Procedure Laterality Date  . CIRCUMCISION     done as a child- given inhalation   . JOINT REPLACEMENT Bilateral   . KNEE CLOSED REDUCTION Left 03/19/2016   Procedure: CLOSED MANIPULATION LEFT KNEE;  Surgeon: Tarry Kos, MD;  Location: [facility] OR;  Service: Orthopedics;   Laterality: Left;  . TEE WITHOUT CARDIOVERSION N/A 02/19/2016   Procedure: TRANSESOPHAGEAL ECHOCARDIOGRAM (TEE);  Surgeon: Thurmon Fair, MD;  Location: [facility] ENDOSCOPY;  Service: Cardiovascular;  Laterality: N/A;  . TOTAL KNEE ARTHROPLASTY Right 05/26/2015   Procedure: RIGHT TOTAL KNEE ARTHROPLASTY;  Surgeon: Tarry Kos, MD;  Location: [facility] OR;  Service: Orthopedics;  Laterality: Right;  . TOTAL KNEE ARTHROPLASTY Left 01/01/2016   Procedure: LEFT TOTAL KNEE ARTHROPLASTY;  Surgeon: Tarry Kos, MD;  Location: [facility] OR;  Service: Orthopedics;  Laterality: Left;    Home Medications:  Allergies as of 04/03/2020      Reactions   No Known Allergies       Medication List       Accurate as of April 03, 2020  9:28 AM. If you have any questions, ask your nurse or doctor.        STOP taking these medications   Amitiza 24 MCG capsule Generic drug: lubiprostone Stopped by: Aviva Wolfer, PA-C   amLODipine 10 MG tablet Commonly known as: NORVASC Stopped by: Lynnix Schoneman, PA-C   cephALEXin 500 MG capsule Commonly known as: KEFLEX Stopped by: Celinda Dethlefs, PA-C   ketoconazole 2 % cream Commonly known as: NIZORAL Stopped by: Jalisha Enneking, PA-C   predniSONE 10 MG tablet Commonly known as: DELTASONE Stopped by: Rajanae Mantia, PA-C   sildenafil 20 MG tablet Commonly known as: REVATIO Stopped by: Farheen Pfahler, PA-C   sulfamethoxazole-trimethoprim 800-160 MG tablet Commonly known as: BACTRIM DS Stopped by: Shelvia Fojtik, PA-C     TAKE these medications   Cialis 20 MG tablet Generic drug: tadalafil Take 20 mg by mouth every 4 (four) hours.   colchicine 0.6 MG tablet Take 0.6 mg by mouth daily.   diclofenac Sodium 1 % Gel Commonly known as: VOLTAREN SMARTSIG:2 Gram(s) Topical Every 6 Hours PRN What changed: Another medication with the same name was removed. Continue taking this medication, and follow the directions you see here. Changed by: Jamil Armwood, PA-C     Eliquis 5 MG Tabs tablet Generic drug: apixaban SMARTSIG:1 Tablet(s) By Mouth Every 12 Hours   losartan 25 MG tablet Commonly known as: COZAAR Take 25 mg by mouth daily.  losartan-hydrochlorothiazide 50-12.5 MG tablet Commonly known as: HYZAAR Take 0.5 tablets by mouth daily.   NONFORMULARY OR COMPOUNDED ITEM Trimix (30/1/50)-(Pap/Phent/PGE) Dosage: Inject 0.8-1 cc per injection 10 1cc vials Qty1 Refills [facility] [phone] Fax [phone]   oxyCODONE-acetaminophen 10-325 MG tablet Commonly known as: PERCOCET TK 1 T PO FID PRF PAIN   testosterone cypionate 200 MG/ML injection Commonly known as: DEPOTESTOSTERONE CYPIONATE Inject 1 mL (200 mg total) into the muscle every 14 (fourteen) days.       Allergies:  Allergies  Allergen Reactions  . No Known Allergies     Family History: Family History  Problem Relation Age of Onset  . Headache Neg Hx     Social History:  reports that he has never smoked. He has never used smokeless tobacco. He reports that he does not drink alcohol and does not use drugs.  ROS: For pertinent review of systems please refer to history of present illness  Physical Exam: BP (!) 157/98 (BP Location: Left Arm, Patient Position: Sitting, Cuff Size: Large)   Pulse 64   Ht 5\' 10"  (1.778 m)   Wt (!) 245 lb (111.1 kg)   BMI 35.15 kg/m   Constitutional:  Well nourished. Alert and oriented, No acute distress. HEENT: [place] AT, mask in place.  Trachea midline Cardiovascular: No clubbing, cyanosis, or edema. Respiratory: Normal respiratory effort, no increased work of breathing. GI: Abdomen is soft, non tender, non distended, no abdominal masses. Liver and spleen not palpable.  No hernias appreciated.  Stool sample for occult testing is not indicated.   GU: No CVA tenderness.  No bladder fullness or masses.  Patient with circumcised phallus.  Urethral meatus is patent.  No penile discharge. No penile lesions or rashes. Scrotum  without lesions, cysts, rashes and/or edema.  Testicles are located scrotally bilaterally. No masses are appreciated in the testicles. Left and right epididymis are normal. Rectal: Patient with  normal sphincter tone. Anus and perineum without scarring or rashes. No rectal masses are appreciated. Prostate is approximately 50 + grams, could only palpate the apex and midportion of the gland, no nodules are appreciated. Seminal vesicles could not be palpated Skin: No rashes, bruises or suspicious lesions. Lymph: No inguinal adenopathy. Neurologic: Grossly intact, no focal deficits, moving all 4 extremities. Psychiatric: Normal mood and affect.   Laboratory Data: Lab Results  Component Value Date   TESTOSTERONE 1,269 (H) 03/25/2020   Component     Latest Ref Rng & Units 12/26/2017 09/27/2018 04/03/2019 09/26/2019  Prostate Specific Ag, Serum     0.0 - 4.0 ng/mL 3.1 1.0 1.0 0.8   Component     Latest Ref Rng & Units 03/25/2020  Prostate Specific Ag, Serum     0.0 - 4.0 ng/mL 1.2  I have reviewed the labs.   I have reviewed the labs.  Assessment & Plan:    1. Testosterone deficiency  Testosterone level is above therapeutic level (goal 450-600 ng/dL) Decrease testosterone cypionate 200 mg/IM, 0.75 cc every two weeks RTC in 3 months for HCT/HBG, testosterone (one week after injection) and exam  2. BPH with LUTS IPSS score is 9/3, it is slightly worse Continue conservative management, avoiding bladder irritants and timed voiding's RTC in 6 months for IPSS, PSA and exam, as testosterone therapy can cause prostate enlargement and worsen LUTS  3. Erectile dysfunction:    SHIM score: 13, it is improved Will increase the Super Trimix to 8 to 10 mcg with each injection RTC in 6  months for SHIM and exam   Return in about 3 months (around 07/04/2020) for testosterone (one week after injection), HCT and hemoglobin .  Michiel Cowboy, PA-C  [facility] [address] [place], [place] [address] [phone]

## 2020-04-03 ENCOUNTER — Ambulatory Visit (INDEPENDENT_AMBULATORY_CARE_PROVIDER_SITE_OTHER): Payer: Medicare HMO | Admitting: Urology

## 2020-04-03 ENCOUNTER — Encounter: Payer: Self-pay | Admitting: Urology

## 2020-04-03 ENCOUNTER — Other Ambulatory Visit: Payer: Self-pay

## 2020-04-03 VITALS — BP 157/98 | HR 64 | Ht 70.0 in | Wt 245.0 lb

## 2020-04-03 DIAGNOSIS — N138 Other obstructive and reflux uropathy: Secondary | ICD-10-CM | POA: Diagnosis not present

## 2020-04-03 DIAGNOSIS — N401 Enlarged prostate with lower urinary tract symptoms: Secondary | ICD-10-CM

## 2020-04-03 DIAGNOSIS — N5201 Erectile dysfunction due to arterial insufficiency: Secondary | ICD-10-CM | POA: Diagnosis not present

## 2020-04-03 DIAGNOSIS — E349 Endocrine disorder, unspecified: Secondary | ICD-10-CM | POA: Diagnosis not present

## 2020-04-03 MED ORDER — TESTOSTERONE CYPIONATE 200 MG/ML IM SOLN
150.0000 mg | Freq: Once | INTRAMUSCULAR | Status: AC
  Administered 2020-04-03: 150 mg via INTRAMUSCULAR

## 2020-04-03 MED ORDER — NONFORMULARY OR COMPOUNDED ITEM: 3 refills | Status: DC

## 2020-04-04 LAB — URINALYSIS, COMPLETE
Bilirubin, UA: NEGATIVE
Glucose, UA: NEGATIVE
Ketones, UA: NEGATIVE
Leukocytes,UA: NEGATIVE
Nitrite, UA: NEGATIVE
Specific Gravity, UA: 1.02 (ref 1.005–1.030)
Urobilinogen, Ur: 2 mg/dL — ABNORMAL HIGH (ref 0.2–1.0)
pH, UA: 6 (ref 5.0–7.5)

## 2020-04-04 LAB — MICROSCOPIC EXAMINATION
Bacteria, UA: NONE SEEN
Epithelial Cells (non renal): NONE SEEN /hpf (ref 0–10)

## 2020-04-10 ENCOUNTER — Other Ambulatory Visit: Payer: Self-pay

## 2020-04-10 ENCOUNTER — Other Ambulatory Visit: Payer: Medicare HMO

## 2020-04-10 ENCOUNTER — Other Ambulatory Visit: Payer: Self-pay | Admitting: Urology

## 2020-04-10 DIAGNOSIS — E349 Endocrine disorder, unspecified: Secondary | ICD-10-CM

## 2020-04-10 NOTE — Progress Notes (Signed)
Patient presented to the office for testosterone recheck

## 2020-04-11 LAB — TESTOSTERONE: Testosterone: 810 ng/dL (ref 264–916)

## 2020-04-17 ENCOUNTER — Ambulatory Visit: Payer: Medicare HMO

## 2020-04-17 ENCOUNTER — Other Ambulatory Visit: Payer: Self-pay | Admitting: Urology

## 2020-04-17 ENCOUNTER — Other Ambulatory Visit: Payer: Self-pay

## 2020-04-17 DIAGNOSIS — E291 Testicular hypofunction: Secondary | ICD-10-CM

## 2020-04-18 ENCOUNTER — Other Ambulatory Visit: Payer: Self-pay | Admitting: Urology

## 2020-04-18 MED ORDER — TESTOSTERONE CYPIONATE 200 MG/ML IM SOLN: INTRAMUSCULAR | 0 refills | Status: DC

## 2020-04-18 NOTE — Progress Notes (Signed)
Testosterone prescription sent in.

## 2020-04-21 ENCOUNTER — Ambulatory Visit (INDEPENDENT_AMBULATORY_CARE_PROVIDER_SITE_OTHER): Payer: Medicare HMO | Admitting: *Deleted

## 2020-04-21 ENCOUNTER — Other Ambulatory Visit: Payer: Self-pay

## 2020-04-21 DIAGNOSIS — E291 Testicular hypofunction: Secondary | ICD-10-CM

## 2020-04-21 MED ORDER — TESTOSTERONE CYPIONATE 200 MG/ML IM SOLN
200.0000 mg | Freq: Once | INTRAMUSCULAR | Status: AC
  Administered 2020-04-21: 200 mg via INTRAMUSCULAR

## 2020-04-21 NOTE — Progress Notes (Signed)
Testosterone IM Injection  Due to Hypogonadism patient is present today for a Testosterone Injection.  Medication: Testosterone Cypionate Dose: 0.75 Location: right upper outer buttocks Lot: [id number]  Exp:07/2021  Patient tolerated well, no complications were noted  Performed by: Milas Kocher, CMA  Follow up: 2 weeks

## 2020-05-05 ENCOUNTER — Ambulatory Visit (INDEPENDENT_AMBULATORY_CARE_PROVIDER_SITE_OTHER): Payer: Medicare HMO | Admitting: Physician Assistant

## 2020-05-05 ENCOUNTER — Other Ambulatory Visit: Payer: Self-pay

## 2020-05-05 DIAGNOSIS — E291 Testicular hypofunction: Secondary | ICD-10-CM

## 2020-05-05 MED ORDER — TESTOSTERONE CYPIONATE 200 MG/ML IM SOLN
200.0000 mg | Freq: Once | INTRAMUSCULAR | Status: AC
  Administered 2020-05-05: 200 mg via INTRAMUSCULAR

## 2020-05-05 NOTE — Progress Notes (Signed)
Testosterone IM Injection  Due to Hypogonadism patient is present today for a Testosterone Injection.  Medication: Testosterone Cypionate Dose: 0.75 Location: right upper outer buttocks Lot: [id number] Exp:11/22  Patient tolerated well, no complications were noted  Preformed by: Teressa Lower, CMA  Follow up: 2 weeks 0.40ml

## 2020-05-15 ENCOUNTER — Other Ambulatory Visit: Payer: Self-pay

## 2020-05-16 ENCOUNTER — Other Ambulatory Visit
Admission: RE | Admit: 2020-05-16 | Discharge: 2020-05-16 | Disposition: A | Discharge status: Home / Self Care | Payer: Medicare HMO | Source: Ambulatory Visit | Attending: Internal Medicine | Admitting: Internal Medicine

## 2020-05-16 ENCOUNTER — Other Ambulatory Visit: Payer: Self-pay

## 2020-05-16 DIAGNOSIS — Z20822 Contact with and (suspected) exposure to covid-19: Secondary | ICD-10-CM | POA: Diagnosis not present

## 2020-05-16 DIAGNOSIS — Z01812 Encounter for preprocedural laboratory examination: Secondary | ICD-10-CM | POA: Insufficient documentation

## 2020-05-16 LAB — SARS CORONAVIRUS 2 (TAT 6-24 HRS): SARS Coronavirus 2: NEGATIVE

## 2020-05-19 ENCOUNTER — Other Ambulatory Visit: Payer: Self-pay

## 2020-05-19 ENCOUNTER — Ambulatory Visit (INDEPENDENT_AMBULATORY_CARE_PROVIDER_SITE_OTHER): Payer: Medicare HMO | Admitting: Family Medicine

## 2020-05-19 DIAGNOSIS — E291 Testicular hypofunction: Secondary | ICD-10-CM | POA: Diagnosis not present

## 2020-05-19 MED ORDER — TESTOSTERONE CYPIONATE 200 MG/ML IM SOLN
200.0000 mg | Freq: Once | INTRAMUSCULAR | Status: AC
  Administered 2020-05-19: 200 mg via INTRAMUSCULAR

## 2020-05-19 NOTE — Progress Notes (Signed)
Testosterone IM Injection  Due to Hypogonadism patient is present today for a Testosterone Injection.  Medication: Testosterone Cypionate Dose: 0.48ml Location: right upper outer buttocks Lot: [id number] Exp:08/2021  Patient tolerated well, no complications were noted  Preformed by: Teressa Lower, CMA  Follow up: 2 weeks

## 2020-05-21 ENCOUNTER — Telehealth: Payer: Self-pay | Admitting: Urology

## 2020-05-21 NOTE — Telephone Encounter (Signed)
Pt called office asking to speak to [facility] directly. Explained to him that Carollee Herter is in clinic and is unable to take calls. Pt said he's a regular pt and is up there all the time and really needs to speak to [facility] about something important.  Re-iterated that she is in clinic seeing patients and unable to make or receive calls right now.  Pt asked to leave his number and have Carollee Herter call him back, when asked pt what's going on so that I could tell Carollee Herter he did not want to elaborate, just that it is about his injections.  Please advise.

## 2020-05-21 NOTE — Telephone Encounter (Signed)
Would you put him on my schedule for tomorrow at 10am for Trimix not working?

## 2020-05-22 ENCOUNTER — Ambulatory Visit (INDEPENDENT_AMBULATORY_CARE_PROVIDER_SITE_OTHER): Payer: Medicare HMO | Admitting: Urology

## 2020-05-22 ENCOUNTER — Other Ambulatory Visit: Payer: Self-pay

## 2020-05-22 ENCOUNTER — Encounter: Payer: Self-pay | Admitting: Urology

## 2020-05-22 VITALS — BP 175/92 | HR 81 | Ht 70.0 in | Wt 260.0 lb

## 2020-05-22 DIAGNOSIS — N5201 Erectile dysfunction due to arterial insufficiency: Secondary | ICD-10-CM

## 2020-05-22 NOTE — Progress Notes (Signed)
11/15/2018 1:24 PM   Walter Hodges 1955/10/26 [id number]  Referring provider: Hoy Register, MD [address] [place],  [place] [address]  Chief Complaint  Patient presents with  . Follow-up    HPI: Walter Hodges is a 64 y.o. male with testosterone deficiency, erectile dysfunction and BPH with LU TS who presents for stating his Trimix is no longer working.   Testosterone deficiency He is no longer having spontaneous erections at night.   He has not been diagnosed with sleep apnea.   He is currently managing his testosterone deficiency with testosterone cypionate 200 mg/IM, 1 cc every two weeks.   BPH WITH LUTS  (prostate and/or bladder) I PSS score: 9/3    Previous IPSS score: 7/2      Major complaint(s):  Frequency and nocturia - which are baseline.  Denies any dysuria, hematuria or suprapubic pain. Denies any recent fevers, chills, nausea or vomiting.  He does not have a family history of PCa.   Erectile dysfunction SHIM: 13      Previous SHIM score: 10     Main complaint: Maintaining an erection x several years Risk factors:  age, BPH, testosterone deficiency and  HTN.   No painful erections or curvatures with his erections.    No longer having spontaneous erections Currently using Super Trimix 7 mcg   He states that he injected his Trimix into the right corpora near the base on Sunday evening.  He noted swelling in the area of the injection, but he did have a satisfactory erection and had intercourse.  He denied any pain associated with intercourse and both the swelling on the side of the penis and the erection abated later that evening.   On Tuesday, they attempted intercourse again.  He injected in the right corpora near to the head of the penis and again experienced a raised area but no erection.    He has not attempted injection or intercourse since that time.  He has not had any penile pain, bleeding or drainage from the penis.  He has not had any fevers or  chills.  He has not had any difficulty with urination.      PMH: Past Medical History:  Diagnosis Date  . Arthritis   . Bell's palsy 2010   lasted one day ago, told that he had a ministroke  . Headache   . Hollenhorst plaque, right eye   . Hypertension    followed by Dr. Nita Sells" with [facility]      Surgical History: Past Surgical History:  Procedure Laterality Date  . CIRCUMCISION     done as a child- given inhalation   . JOINT REPLACEMENT Bilateral   . KNEE CLOSED REDUCTION Left 03/19/2016   Procedure: CLOSED MANIPULATION LEFT KNEE;  Surgeon: Tarry Kos, MD;  Location: [facility] OR;  Service: Orthopedics;  Laterality: Left;  . TEE WITHOUT CARDIOVERSION N/A 02/19/2016   Procedure: TRANSESOPHAGEAL ECHOCARDIOGRAM (TEE);  Surgeon: Thurmon Fair, MD;  Location: [facility] ENDOSCOPY;  Service: Cardiovascular;  Laterality: N/A;  . TOTAL KNEE ARTHROPLASTY Right 05/26/2015   Procedure: RIGHT TOTAL KNEE ARTHROPLASTY;  Surgeon: Tarry Kos, MD;  Location: [facility] OR;  Service: Orthopedics;  Laterality: Right;  . TOTAL KNEE ARTHROPLASTY Left 01/01/2016   Procedure: LEFT TOTAL KNEE ARTHROPLASTY;  Surgeon: Tarry Kos, MD;  Location: [facility] OR;  Service: Orthopedics;  Laterality: Left;    Home Medications:  Allergies as of 05/22/2020      Reactions   No Known  Allergies       Medication List       Accurate as of May 22, 2020  1:24 PM. If you have any questions, ask your nurse or doctor.        amiodarone 200 MG tablet Commonly known as: PACERONE Take by mouth.   Cialis 20 MG tablet Generic drug: tadalafil Take 20 mg by mouth every 4 (four) hours.   colchicine 0.6 MG tablet Take 0.6 mg by mouth daily.   diclofenac Sodium 1 % Gel Commonly known as: VOLTAREN SMARTSIG:2 Gram(s) Topical Every 6 Hours PRN   Eliquis 5 MG Tabs tablet Generic drug: apixaban SMARTSIG:1 Tablet(s) By Mouth Every 12 Hours   losartan 100 MG tablet Commonly known as: COZAAR Take by mouth. What changed: Another  medication with the same name was removed. Continue taking this medication, and follow the directions you see here. Changed by: Michiel Cowboy, PA-C   losartan-hydrochlorothiazide 50-12.5 MG tablet Commonly known as: HYZAAR Take 0.5 tablets by mouth daily.   NONFORMULARY OR COMPOUNDED ITEM Trimix (30/1/50)-(Pap/Phent/PGE) Dosage: Inject 0.8-1 cc per injection 10 1cc vials Qty1 Refills 3 [facility] [phone] Fax [phone]   oxyCODONE-acetaminophen 10-325 MG tablet Commonly known as: PERCOCET TK 1 T PO FID PRF PAIN   testosterone cypionate 200 MG/ML injection Commonly known as: DEPOTESTOSTERONE CYPIONATE Inject 0.75 cc IM every 14 days       Allergies:  Allergies  Allergen Reactions  . No Known Allergies     Family History: Family History  Problem Relation Age of Onset  . Headache Neg Hx     Social History:  reports that he has never smoked. He has never used smokeless tobacco. He reports that he does not drink alcohol and does not use drugs.  ROS: For pertinent review of systems please refer to history of present illness  Physical Exam: BP (!) 175/92   Pulse 81   Ht 5\' 10"  (1.778 m)   Wt 260 lb (117.9 kg)   BMI 37.31 kg/m   Constitutional:  Well nourished. Alert and oriented, No acute distress. HEENT: [place] AT, mask in place.  Trachea midline Cardiovascular: No clubbing, cyanosis, or edema. Respiratory: Normal respiratory effort, no increased work of breathing. GU: Patient with circumcised phallus.  Urethral meatus is patent.  No penile discharge. No penile lesions or rashes.  The penis is swollen, but it is not painful to palpation.  There is no oozing or blood expressed with penile compression.  There is no erythema, fluctuant mass or crepitus noted in the penis.  It is flaccid enough to be bent dorsally and ventrally and side to side without causing any discomfort. Lymph: No inguinal adenopathy. Neurologic: Grossly intact, no focal deficits,  moving all 4 extremities. Psychiatric: Normal mood and affect.   Laboratory Data: Lab Results  Component Value Date   TESTOSTERONE 810 04/10/2020   Component     Latest Ref Rng & Units 12/26/2017 09/27/2018 04/03/2019 09/26/2019  Prostate Specific Ag, Serum     0.0 - 4.0 ng/mL 3.1 1.0 1.0 0.8   Component     Latest Ref Rng & Units 03/25/2020  Prostate Specific Ag, Serum     0.0 - 4.0 ng/mL 1.2  I have reviewed the labs.  Assessment & Plan:    1. Erectile dysfunction:    My suspicion is he is not "hubbing" the needle during injection and some of the Trimix is getting into the fascia surrounding the erectile tissue and/or he is nicking some  of the surface vascular structures He will abstain from any further injections at this time I made the suggestion that he should consider placement of IPP and offered referral, but he declined at this time I did give him the [employer] brochure that includes a DVD that explains the IPP for him to review prior to his follow-up appointment I will see him again in 2 weeks for exam  Return in about 2 weeks (around 06/05/2020) for recheck and exam .  Michiel Cowboy, [facility] [address] [place], [place] [address] [phone]  I spent 30 minutes on the day of the encounter to include pre-visit record review, face-to-face time with the patient, and post-visit ordering of tests.

## 2020-05-26 ENCOUNTER — Telehealth: Payer: Self-pay | Admitting: Urology

## 2020-05-26 NOTE — Telephone Encounter (Signed)
Pt is calling he is requesting a call from [facility] regarding his injection

## 2020-05-26 NOTE — Progress Notes (Signed)
11/15/2018 8:39 AM   Walter Hodges [birth year] [id number]  Referring provider: Hoy Register, MD [address] [place],  [place] [address]  Chief Complaint  Patient presents with  . Other    HPI: Walter Hodges is a 64 y.o. male with testosterone deficiency, erectile dysfunction and BPH with LU TS who presents today stating there is a lump in his penis.    Testosterone deficiency He is no longer having spontaneous erections at night.   He has not been diagnosed with sleep apnea.   He is currently managing his testosterone deficiency with testosterone cypionate 200 mg/IM, 1 cc every two weeks.   BPH WITH LUTS  (prostate and/or bladder) Previous IPSS score: 9/3     Major complaint(s):  Frequency and nocturia - which are baseline.  Denies any dysuria, hematuria or suprapubic pain. Denies any recent fevers, chills, nausea or vomiting.  He does not have a family history of PCa.   Erectile dysfunction Previous SHIM score: 13    Main complaint: Maintaining an erection x several years Risk factors:  age, BPH, testosterone deficiency and  HTN.   No painful erections or curvatures with his erections.    No longer having spontaneous erections Currently using Super Trimix 7 mcg   He presented last week with issues injecting the Trimix and he was advised not to inject anymore Trimix until he followed up in two weeks.  Last week: He states that he injected his Trimix into the right corpora near the base on Sunday evening.  He noted swelling in the area of the injection, but he did have a satisfactory erection and had intercourse.  He denied any pain associated with intercourse and both the swelling on the side of the penis and the erection abated later that evening.   On Tuesday, they attempted intercourse again.  He injected in the right corpora near to the head of the penis and again experienced a raised area but no erection.  He has not attempted injection or intercourse since that  time.  He has not had any penile pain, bleeding or drainage from the penis.  He has not had any fevers or chills.  He has not had any difficulty with urination.    He called the office yesterday and stated he felt a knot in his penis next to a surface vein.   He noticed this area and didn't want to wait for his two week follow up.  He is not having any pain.  He has not injected the Trimix as instructed.  Patient denies any modifying or aggravating factors.  Patient denies any gross hematuria, dysuria or suprapubic/flank pain.  Patient denies any fevers, chills, nausea or vomiting.    PMH: Past Medical History:  Diagnosis Date  . Arthritis   . Bell's palsy 2010   lasted one day ago, told that he had a ministroke  . Headache   . Hollenhorst plaque, right eye   . Hypertension    followed by Dr. Nita Sells" with [facility]      Surgical History: Past Surgical History:  Procedure Laterality Date  . CIRCUMCISION     done as a child- given inhalation   . JOINT REPLACEMENT Bilateral   . KNEE CLOSED REDUCTION Left 03/19/2016   Procedure: CLOSED MANIPULATION LEFT KNEE;  Surgeon: Tarry Kos, MD;  Location: [facility] OR;  Service: Orthopedics;  Laterality: Left;  . TEE WITHOUT CARDIOVERSION N/A 02/19/2016   Procedure: TRANSESOPHAGEAL ECHOCARDIOGRAM (TEE);  Surgeon: Rachelle Hora  Croitoru, MD;  Location: [facility] ENDOSCOPY;  Service: Cardiovascular;  Laterality: N/A;  . TOTAL KNEE ARTHROPLASTY Right 05/26/2015   Procedure: RIGHT TOTAL KNEE ARTHROPLASTY;  Surgeon: Tarry Kos, MD;  Location: [facility] OR;  Service: Orthopedics;  Laterality: Right;  . TOTAL KNEE ARTHROPLASTY Left 01/01/2016   Procedure: LEFT TOTAL KNEE ARTHROPLASTY;  Surgeon: Tarry Kos, MD;  Location: [facility] OR;  Service: Orthopedics;  Laterality: Left;    Home Medications:  Allergies as of 05/27/2020   No Known Allergies     Medication List       Accurate as of May 27, 2020  8:39 AM. If you have any questions, ask your nurse or doctor.          amiodarone 200 MG tablet Commonly known as: PACERONE Take 200 mg by mouth every 12 (twelve) hours.   Eliquis 5 MG Tabs tablet Generic drug: apixaban Take 5 mg by mouth every 12 (twelve) hours.   losartan 100 MG tablet Commonly known as: COZAAR Take 100 mg by mouth daily.   NONFORMULARY OR COMPOUNDED ITEM Trimix (30/1/50)-(Pap/Phent/PGE) Dosage: Inject 0.8-1 cc per injection 10 1cc vials Qty1 Refills [facility] [phone] Fax [phone] What changed:   how much to take  how to take this  when to take this  reasons to take this   testosterone cypionate 200 MG/ML injection Commonly known as: DEPOTESTOSTERONE CYPIONATE Inject 0.75 cc IM every 14 days What changed:   how much to take  how to take this  when to take this       Allergies:  No Known Allergies  Family History: Family History  Problem Relation Age of Onset  . Headache Neg Hx     Social History:  reports that he has never smoked. He has never used smokeless tobacco. He reports that he does not drink alcohol and does not use drugs.  ROS: For pertinent review of systems please refer to history of present illness  Physical Exam: BP (!) 166/101   Pulse 67   Ht 5\' 10"  (1.778 m)   Wt 260 lb (117.9 kg)   BMI 37.31 kg/m   Constitutional:  Well nourished. Alert and oriented, No acute distress. HEENT: [place] AT, mask in place.  Trachea midline Cardiovascular: No clubbing, cyanosis, or edema. Respiratory: Normal respiratory effort, no increased work of breathing. GU:   Patient with circumcised phallus.  Urethral meatus is patent.  No penile discharge. No penile lesions or rashes.  The area of concern for the patient is located on the right towards the head of the glans closer to the dorsal surface.  There is a small bruise (7 mm x 3 mm) with a small firm, non-tender, nodule below the surface that is not adhered to the tunica.   Skin: No rashes, bruises or suspicious lesions. Lymph: No  inguinal adenopathy. Neurologic: Grossly intact, no focal deficits, moving all 4 extremities. Psychiatric: Normal mood and affect.   Laboratory Data: Lab Results  Component Value Date   TESTOSTERONE 810 04/10/2020   Component     Latest Ref Rng & Units 12/26/2017 09/27/2018 04/03/2019 09/26/2019  Prostate Specific Ag, Serum     0.0 - 4.0 ng/mL 3.1 1.0 1.0 0.8   Component     Latest Ref Rng & Units 03/25/2020  Prostate Specific Ag, Serum     0.0 - 4.0 ng/mL 1.2  I have reviewed the labs.  Assessment & Plan:    1. Erectile dysfunction:    This is  the area where he injected previously and likely nicked a vein and caused a small hematoma which is now resolving I have encouraged him to watch the DVD concerning the IPP as it is likely he will need one in the future to obtain adequate erections for sexual intercourse as the Trimix is becoming ineffective Patient is to return as scheduled on September 30   Return for Keep scheduled follow-up on September 30.  Michiel Cowboy, PA-C  [facility] [address] [place], [place] [address] [phone]

## 2020-05-26 NOTE — Telephone Encounter (Signed)
Patient has a knot in his penis near a vein.  He is coming tomorrow at 8 am.

## 2020-05-27 ENCOUNTER — Other Ambulatory Visit: Payer: Self-pay

## 2020-05-27 ENCOUNTER — Encounter: Payer: Self-pay | Admitting: Urology

## 2020-05-27 ENCOUNTER — Other Ambulatory Visit
Admission: RE | Admit: 2020-05-27 | Discharge: 2020-05-27 | Disposition: A | Discharge status: Home / Self Care | Payer: Medicare HMO | Source: Ambulatory Visit | Attending: Internal Medicine | Admitting: Internal Medicine

## 2020-05-27 ENCOUNTER — Ambulatory Visit (INDEPENDENT_AMBULATORY_CARE_PROVIDER_SITE_OTHER): Payer: Medicare HMO | Admitting: Urology

## 2020-05-27 VITALS — BP 166/101 | HR 67 | Ht 70.0 in | Wt 260.0 lb

## 2020-05-27 DIAGNOSIS — Z20822 Contact with and (suspected) exposure to covid-19: Secondary | ICD-10-CM | POA: Insufficient documentation

## 2020-05-27 DIAGNOSIS — Z01812 Encounter for preprocedural laboratory examination: Secondary | ICD-10-CM | POA: Diagnosis present

## 2020-05-27 DIAGNOSIS — N5201 Erectile dysfunction due to arterial insufficiency: Secondary | ICD-10-CM | POA: Diagnosis not present

## 2020-05-28 LAB — SARS CORONAVIRUS 2 (TAT 6-24 HRS): SARS Coronavirus 2: NEGATIVE

## 2020-05-29 ENCOUNTER — Encounter: Payer: Self-pay | Admitting: Internal Medicine

## 2020-05-29 ENCOUNTER — Other Ambulatory Visit: Payer: Self-pay

## 2020-05-29 ENCOUNTER — Ambulatory Visit: Payer: Medicare HMO | Admitting: Certified Registered"

## 2020-05-29 ENCOUNTER — Other Ambulatory Visit: Payer: Self-pay | Admitting: Cardiology

## 2020-05-29 ENCOUNTER — Encounter
Admission: RE | Disposition: A | Discharge status: Home / Self Care | Payer: Self-pay | Source: Home / Self Care | Attending: Internal Medicine

## 2020-05-29 ENCOUNTER — Ambulatory Visit
Admission: RE | Admit: 2020-05-29 | Discharge: 2020-05-29 | Disposition: A | Discharge status: Home / Self Care | Payer: Medicare HMO | Attending: Internal Medicine | Admitting: Internal Medicine

## 2020-05-29 DIAGNOSIS — Z6837 Body mass index (BMI) 37.0-37.9, adult: Secondary | ICD-10-CM | POA: Diagnosis not present

## 2020-05-29 DIAGNOSIS — Z79890 Hormone replacement therapy (postmenopausal): Secondary | ICD-10-CM | POA: Diagnosis not present

## 2020-05-29 DIAGNOSIS — Z7901 Long term (current) use of anticoagulants: Secondary | ICD-10-CM | POA: Diagnosis not present

## 2020-05-29 DIAGNOSIS — Z79899 Other long term (current) drug therapy: Secondary | ICD-10-CM | POA: Diagnosis not present

## 2020-05-29 DIAGNOSIS — Z79891 Long term (current) use of opiate analgesic: Secondary | ICD-10-CM | POA: Diagnosis not present

## 2020-05-29 DIAGNOSIS — I4819 Other persistent atrial fibrillation: Secondary | ICD-10-CM | POA: Diagnosis present

## 2020-05-29 DIAGNOSIS — Z96653 Presence of artificial knee joint, bilateral: Secondary | ICD-10-CM | POA: Insufficient documentation

## 2020-05-29 DIAGNOSIS — E669 Obesity, unspecified: Secondary | ICD-10-CM | POA: Insufficient documentation

## 2020-05-29 DIAGNOSIS — I10 Essential (primary) hypertension: Secondary | ICD-10-CM | POA: Insufficient documentation

## 2020-05-29 HISTORY — PX: CARDIOVERSION: SHX1299

## 2020-05-29 HISTORY — DX: Cardiac arrhythmia, unspecified: I49.9

## 2020-05-29 SURGERY — CARDIOVERSION
Anesthesia: General

## 2020-05-29 MED ORDER — PROPOFOL 10 MG/ML IV BOLUS
INTRAVENOUS | Status: AC
  Filled 2020-05-29: qty 20

## 2020-05-29 MED ORDER — PROPOFOL 10 MG/ML IV BOLUS
INTRAVENOUS | Status: DC | PRN
  Administered 2020-05-29: 50 mg via INTRAVENOUS

## 2020-05-29 MED ORDER — ONDANSETRON HCL 4 MG/2ML IJ SOLN: 4.0000 mg | Freq: Once | INTRAMUSCULAR | Status: DC | PRN

## 2020-05-29 MED ORDER — HYDROCORTISONE 1 % EX CREA
1.0000 "application " | TOPICAL_CREAM | Freq: Three times a day (TID) | CUTANEOUS | Status: DC | PRN
  Filled 2020-05-29: qty 28

## 2020-05-29 MED ORDER — FENTANYL CITRATE (PF) 100 MCG/2ML IJ SOLN: 25.0000 ug | INTRAMUSCULAR | Status: DC | PRN

## 2020-05-29 MED ORDER — SODIUM CHLORIDE 0.9 % IV SOLN: INTRAVENOUS | Status: DC

## 2020-05-29 NOTE — Discharge Instructions (Signed)
Electrical Cardioversion Electrical cardioversion is the delivery of a jolt of electricity to restore a normal rhythm to the heart. A rhythm that is too fast or is not regular keeps the heart from pumping well. In this procedure, sticky patches or metal paddles are placed on the chest to deliver electricity to the heart from a device. This procedure may be done in an emergency if:  There is low or no blood pressure as a result of the heart rhythm.  Normal rhythm must be restored as fast as possible to protect the brain and heart from further damage.  It may save a life. This may also be a scheduled procedure for irregular or fast heart rhythms that are not immediately life-threatening. Tell a health care provider about:  Any allergies you have.  All medicines you are taking, including vitamins, herbs, eye drops, creams, and over-the-counter medicines.  Any problems you or family members have had with anesthetic medicines.  Any blood disorders you have.  Any surgeries you have had.  Any medical conditions you have.  Whether you are pregnant or may be pregnant. What are the risks? Generally, this is a safe procedure. However, problems may occur, including:  Allergic reactions to medicines.  A blood clot that breaks free and travels to other parts of your body.  The possible return of an abnormal heart rhythm within hours or days after the procedure.  Your heart stopping (cardiac arrest). This is rare. What happens before the procedure? Medicines  Your health care provider may have you start taking: ? Blood-thinning medicines (anticoagulants) so your blood does not clot as easily. ? Medicines to help stabilize your heart rate and rhythm.  Ask your health care provider about: ? Changing or stopping your regular medicines. This is especially important if you are taking diabetes medicines or blood thinners. ? Taking medicines such as aspirin and ibuprofen. These medicines can  thin your blood. Do not take these medicines unless your health care provider tells you to take them. ? Taking over-the-counter medicines, vitamins, herbs, and supplements. General instructions  Follow instructions from your health care provider about eating or drinking restrictions.  Plan to have someone take you home from the hospital or clinic.  If you will be going home right after the procedure, plan to have someone with you for 24 hours.  Ask your health care provider what steps will be taken to help prevent infection. These may include washing your skin with a germ-killing soap. What happens during the procedure?   An IV will be inserted into one of your veins.  Sticky patches (electrodes) or metal paddles may be placed on your chest.  You will be given a medicine to help you relax (sedative).  An electrical shock will be delivered. The procedure may vary among health care providers and hospitals. What can I expect after the procedure?  Your blood pressure, heart rate, breathing rate, and blood oxygen level will be monitored until you leave the hospital or clinic.  Your heart rhythm will be watched to make sure it does not change.  You may have some redness on the skin where the shocks were given. Follow these instructions at home:  Do not drive for 24 hours if you were given a sedative during your procedure.  Take over-the-counter and prescription medicines only as told by your health care provider.  Ask your health care provider how to check your pulse. Check it often.  Rest for 48 hours after the procedure or   as told by your health care provider.  Avoid or limit your caffeine use as told by your health care provider.  Keep all follow-up visits as told by your health care provider. This is important. Contact a health care provider if:  You feel like your heart is beating too quickly or your pulse is not regular.  You have a serious muscle cramp that does not go  away. Get help right away if:  You have discomfort in your chest.  You are dizzy or you feel faint.  You have trouble breathing or you are short of breath.  Your speech is slurred.  You have trouble moving an arm or leg on one side of your body.  Your fingers or toes turn cold or blue. Summary  Electrical cardioversion is the delivery of a jolt of electricity to restore a normal rhythm to the heart.  This procedure may be done right away in an emergency or may be a scheduled procedure if the condition is not an emergency.  Generally, this is a safe procedure.  After the procedure, check your pulse often as told by your health care provider. This information is not intended to replace advice given to you by your health care provider. Make sure you discuss any questions you have with your health care provider. Document Revised: 03/26/2019 Document Reviewed: 03/26/2019 Elsevier Patient Education  2020 [employer]  

## 2020-05-29 NOTE — Transfer of Care (Signed)
Immediate Anesthesia Transfer of Care Note  Patient: Walter Hodges  Procedure(s) Performed: CARDIOVERSION (N/A )  Patient Location: PACU and Cath Lab  Anesthesia Type:General  Level of Consciousness: drowsy  Airway & Oxygen Therapy: Patient Spontanous Breathing and Patient connected to nasal cannula oxygen  Post-op Assessment: Report given to RN  Post vital signs: stable  Last Vitals:  Vitals Value Taken Time  BP 154/102 05/29/20 0845  Temp    Pulse 67 05/29/20 0848  Resp 0 05/29/20 0848  SpO2 99 % 05/29/20 0848    Last Pain:  Vitals:   05/29/20 0747  TempSrc: Oral  PainSc: 0-No pain         Complications: No complications documented.

## 2020-05-29 NOTE — CV Procedure (Signed)
Electrical Cardioversion Procedure Note   Procedure: Electrical Cardioversion Indications:  Atrial Fibrillation  Procedure Details Consent: Risks of procedure as well as the alternatives and risks of each were explained to the (patient/caregiver).  Consent for procedure obtained. Time Out: Verified patient identification, verified procedure, site/side was marked, verified correct patient position, special equipment/implants available, medications/allergies/relevent history reviewed, required imaging and test results available.  Performed  Patient placed on cardiac monitor, pulse oximetry, supplemental oxygen as necessary.  Sedation given: As per anesthesia Pacer pads placed anterior and posterior chest.  Cardioverted 2 time(s).  Cardioverted at 120J.  Evaluation Findings: Post procedure EKG shows: NSR Complications: None Patient did tolerate procedure well.    Dorothyann Peng MD 05/29/2020 8:45

## 2020-05-29 NOTE — Anesthesia Preprocedure Evaluation (Signed)
Anesthesia Evaluation  Patient identified by MRN, date of birth, ID band Patient awake    Reviewed: Allergy & Precautions, H&P , NPO status , Patient's Chart, lab work & pertinent test results, reviewed documented beta blocker date and time   Airway Mallampati: III   Neck ROM: full    Dental  (+) Teeth Intact   Pulmonary neg pulmonary ROS,    Pulmonary exam normal        Cardiovascular Exercise Tolerance: Poor hypertension, On Medications negative cardio ROS  Atrial Fibrillation  Rhythm:regular Rate:Normal     Neuro/Psych  Headaches,  Neuromuscular disease negative psych ROS   GI/Hepatic negative GI ROS, Neg liver ROS,   Endo/Other  negative endocrine ROS  Renal/GU negative Renal ROS  negative genitourinary   Musculoskeletal   Abdominal   Peds  Hematology negative hematology ROS (+)   Anesthesia Other Findings Past Medical History: No date: Arthritis 2010: Bell's palsy     Comment:  lasted one day ago, told that he had a ministroke No date: Headache 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 Nita Sells" with [facility]   Past Surgical History: No date: CIRCUMCISION     Comment:  done as a child- given inhalation  No date: JOINT REPLACEMENT; Bilateral 03/19/2016: KNEE CLOSED REDUCTION; Left     Comment:  Procedure: CLOSED MANIPULATION LEFT KNEE;  Surgeon:               Tarry Kos, MD;  Location: [facility] OR;  Service:               Orthopedics;  Laterality: Left; 02/19/2016: TEE WITHOUT CARDIOVERSION; N/A     Comment:  Procedure: TRANSESOPHAGEAL ECHOCARDIOGRAM (TEE);                Surgeon: Thurmon Fair, MD;  Location: [facility] END[facility]COPY;                Service: Cardiovascular;  Laterality: N/A; 05/26/2015: TOTAL KNEE ARTHROPLASTY; Right     Comment:  Procedure: RIGHT TOTAL KNEE ARTHROPLASTY;  Surgeon:               Tarry Kos, MD;  Location: [facility] OR;  Service:                Orthopedics;  Laterality: Right; 01/01/2016: TOTAL KNEE ARTHROPLASTY; Left     Comment:  Procedure: LEFT TOTAL KNEE ARTHROPLASTY;  Surgeon:               Tarry Kos, MD;  Location: [facility] OR;  Service:               Orthopedics;  Laterality: Left;   Reproductive/Obstetrics negative OB ROS                             Anesthesia Physical Anesthesia Plan  ASA: III  Anesthesia Plan: General   Post-op Pain Management:    Induction:   PONV Risk Score and Plan: 2  Airway Management Planned:   Additional Equipment:   Intra-op Plan:   Post-operative Plan:   Informed Consent: I have reviewed the patients History and Physical, chart, labs and discussed the procedure including the risks, benefits and alternatives for the proposed anesthesia with the patient or authorized representative who has indicated his/her understanding and acceptance.     Dental Advisory Given  Plan Discussed with: CRNA  Anesthesia Plan Comments:  Anesthesia Quick Evaluation  

## 2020-05-30 ENCOUNTER — Encounter: Payer: Self-pay | Admitting: Internal Medicine

## 2020-05-30 NOTE — Anesthesia Postprocedure Evaluation (Signed)
Anesthesia Post Note  Patient: Walter Hodges  Procedure(s) Performed: CARDIOVERSION (N/A )  Patient location during evaluation: PACU Anesthesia Type: General Level of consciousness: awake and alert Pain management: pain level controlled Vital Signs Assessment: post-procedure vital signs reviewed and stable Respiratory status: spontaneous breathing, nonlabored ventilation, respiratory function stable and patient connected to nasal cannula oxygen Cardiovascular status: blood pressure returned to baseline and stable Postop Assessment: no apparent nausea or vomiting Anesthetic complications: no   No complications documented.   Last Vitals:  Vitals:   05/29/20 0915 05/29/20 0925  BP: (!) 147/89 (!) 147/89  Pulse: 69 72  Resp: 17 17  Temp:    SpO2: 99% 98%    Last Pain:  Vitals:   05/29/20 0915  TempSrc:   PainSc: 0-No pain                 Yevette Edwards

## 2020-06-04 NOTE — Progress Notes (Signed)
06/05/2020 9:15 AM   [name] Dec 12, 1955 [id number]  Referring provider: Hoy Register, MD [address] [place],  [place] [address] Chief Complaint  Patient presents with  . Erectile Dysfunction    HPI: Walter Hodges is a 64 y.o. male with testosterone deficiency, BPH with LUTS and erectile dysfunction who was managing his ED with Trimix injections who suffered penile injury while injecting himself.     Erectile dysfunction Previous SHIM score: 13    Main complaint: Maintaining an erection x several years Risk factors:  age, BPH, testosterone deficiency and  HTN.    Patient is currently using Super Trimix 7 mcg   He presented last week with issues injecting the Trimix and he was advised not to inject anymore Trimix until he followed up in two weeks.  05/22/2020 visit: He states that he injected his Trimix into the right corpora near the base on Sunday evening.  He noted swelling in the area of the injection, but he did have a satisfactory erection and had intercourse.  He denied any pain associated with intercourse and both the swelling on the side of the penis and the erection abated later that evening.   On Tuesday, they attempted intercourse again.  He injected in the right corpora near to the head of the penis and again experienced a raised area but no erection.  He has not attempted injection or intercourse since that time.  He has not had any penile pain, bleeding or drainage from the penis.  He has not had any fevers or chills.  He has not had any difficulty with urination.  05/27/2020 visit:   He called the office yesterday and stated he felt a knot in his penis next to a surface vein.   He noticed this area and didn't want to wait for his two week follow up.  He is not having any pain.  He has not injected the Trimix as instructed.  Patient denies any modifying or aggravating factors.  Patient denies any gross hematuria, dysuria or suprapubic/flank pain.   Patient denies any fevers, chills, nausea or vomiting.   Today, he states his penis is "back to normal" and he is eager to try the injections again.  He is not interested in having a consultation for IPP at this time.  He has not injected himself with Trimix since the initial issue.  Patient denies any modifying or aggravating factors.  Patient denies any gross hematuria, dysuria or suprapubic/flank pain.  Patient denies any fevers, chills, nausea or vomiting.    PMH: Past Medical History:  Diagnosis Date  . Arthritis   . Bell's palsy 2010   lasted one day ago, told that he had a ministroke  . Dysrhythmia    atrial fib  . Headache   . Hollenhorst plaque, right eye   . Hypertension    followed by Dr. Nita Sells" with [facility]      Surgical History: Past Surgical History:  Procedure Laterality Date  . CARDIOVERSION N/A 05/29/2020   Procedure: CARDIOVERSION;  Surgeon: Alwyn Pea, MD;  Location: [facility] ORS;  Service: Cardiovascular;  Laterality: N/A;  . CIRCUMCISION     done as a child- given inhalation   . JOINT REPLACEMENT Bilateral   . KNEE CLOSED REDUCTION Left 03/19/2016   Procedure: CLOSED MANIPULATION LEFT KNEE;  Surgeon: Tarry Kos, MD;  Location: [facility];  Service: Orthopedics;  Laterality: Left;  . TEE WITHOUT CARDIOVERSION N/A 02/19/2016   Procedure: TRANSESOPHAGEAL ECHOCARDIOGRAM (  TEE);  Surgeon: Thurmon Fair, MD;  Location: [facility] ENDOSCOPY;  Service: Cardiovascular;  Laterality: N/A;  . TOTAL KNEE ARTHROPLASTY Right 05/26/2015   Procedure: RIGHT TOTAL KNEE ARTHROPLASTY;  Surgeon: Tarry Kos, MD;  Location: [facility] OR;  Service: Orthopedics;  Laterality: Right;  . TOTAL KNEE ARTHROPLASTY Left 01/01/2016   Procedure: LEFT TOTAL KNEE ARTHROPLASTY;  Surgeon: Tarry Kos, MD;  Location: [facility];  Service: Orthopedics;  Laterality: Left;    Home Medications:  Allergies as of 06/05/2020   No Known Allergies     Medication List       Accurate as of June 05, 2020 11:59  PM. If you have any questions, ask your nurse or doctor.        amiodarone 200 MG tablet Commonly known as: PACERONE Take 200 mg by mouth every 12 (twelve) hours.   Eliquis 5 MG Tabs tablet Generic drug: apixaban Take 5 mg by mouth every 12 (twelve) hours.   losartan 100 MG tablet Commonly known as: COZAAR Take 100 mg by mouth daily.   NONFORMULARY OR COMPOUNDED ITEM Trimix (30/1/50)-(Pap/Phent/PGE) Dosage: Inject 0.8-1 cc per injection 10 1cc vials Qty1 Refills 3 [facility] [phone] Fax [phone] What changed:   how much to take  how to take this  when to take this  reasons to take this   oxyCODONE-acetaminophen 10-325 MG tablet Commonly known as: PERCOCET Take 1 tablet by mouth every 4 (four) hours as needed.   testosterone cypionate 200 MG/ML injection Commonly known as: DEPOTESTOSTERONE CYPIONATE Inject 0.75 cc IM every 14 days What changed:   how much to take  how to take this  when to take this       Allergies: No Known Allergies  Family History: Family History  Problem Relation Age of Onset  . Headache Neg Hx     Social History:  reports that he has never smoked. He has never used smokeless tobacco. He reports that he does not drink alcohol and does not use drugs.  ROS For pertinent review of systems please refer to history of present illness  Physical Exam: BP (!) 178/100   Pulse 69   Ht 5\' 10"  (1.778 m)   Wt 260 lb (117.9 kg)   BMI 37.31 kg/m   Constitutional:  Alert and oriented, No acute distress. HEENT: [place] AT, mask in place.  Trachea midline Cardiovascular: No clubbing, cyanosis, or edema. Respiratory: Normal respiratory effort, no increased work of breathing. GU: Circumcised phallus. Penis soft and flaccid. Area located on the right towards the head of the glans closer to the dorsal surface no longer palpable. Small bruise (7 mm x 3 mm) resolved.  Neurologic: Grossly intact, no focal deficits, moving all 4  extremities. Psychiatric: Normal mood and affect.  Laboratory Data:  Lab Results  Component Value Date   CREATININE 0.76 12/10/2019   Component     Latest Ref Rng & Units 12/26/2017 09/27/2018 04/03/2019 09/26/2019  Prostate Specific Ag, Serum     0.0 - 4.0 ng/mL 3.1 1.0 1.0 0.8   Component     Latest Ref Rng & Units 03/25/2020  Prostate Specific Ag, Serum     0.0 - 4.0 ng/mL 1.2    Lab Results  Component Value Date   TESTOSTERONE 810 04/10/2020    Assessment & Plan:    1. Erectile dysfunction  Pt is going to re-try Trimix  Advised patient to get a new refill and to discard the Trimix he has at home  2. Hypertension Contact Dr. Glennis Brink office patient was instructed to follow up at their office in 2 weeks.    Return for Keep follow up appointment on 07/04/2020 for labs .  [facility] [address] [place], [place] [address] [phone]  I, Theador Hawthorne, am acting as a scribe for [employer],  I have reviewed the above documentation for accuracy and completeness, and I agree with the above.    Michiel Cowboy, PA-C

## 2020-06-05 ENCOUNTER — Encounter: Payer: Self-pay | Admitting: Urology

## 2020-06-05 ENCOUNTER — Ambulatory Visit (INDEPENDENT_AMBULATORY_CARE_PROVIDER_SITE_OTHER): Payer: Medicare HMO | Admitting: Urology

## 2020-06-05 ENCOUNTER — Other Ambulatory Visit: Payer: Self-pay

## 2020-06-05 VITALS — BP 178/100 | HR 69 | Ht 70.0 in | Wt 260.0 lb

## 2020-06-05 DIAGNOSIS — N5201 Erectile dysfunction due to arterial insufficiency: Secondary | ICD-10-CM

## 2020-07-03 ENCOUNTER — Other Ambulatory Visit: Payer: Self-pay

## 2020-07-03 DIAGNOSIS — E291 Testicular hypofunction: Secondary | ICD-10-CM

## 2020-07-04 ENCOUNTER — Other Ambulatory Visit: Payer: Medicare HMO

## 2020-07-04 ENCOUNTER — Encounter: Payer: Self-pay | Admitting: Urology

## 2020-07-14 ENCOUNTER — Other Ambulatory Visit: Payer: Self-pay

## 2020-07-14 ENCOUNTER — Other Ambulatory Visit: Payer: Medicare HMO

## 2020-07-14 DIAGNOSIS — E291 Testicular hypofunction: Secondary | ICD-10-CM

## 2020-07-15 ENCOUNTER — Telehealth: Payer: Self-pay | Admitting: Family Medicine

## 2020-07-15 LAB — PSA: Prostate Specific Ag, Serum: 0.7 ng/mL (ref 0.0–4.0)

## 2020-07-15 LAB — HEMOGLOBIN AND HEMATOCRIT, BLOOD
Hematocrit: 44.3 % (ref 37.5–51.0)
Hemoglobin: 14.8 g/dL (ref 13.0–17.7)

## 2020-07-15 LAB — TESTOSTERONE: Testosterone: 369 ng/dL (ref 264–916)

## 2020-07-15 NOTE — Telephone Encounter (Signed)
Patient notified and voiced understanding. Appointment have been made.  

## 2020-07-15 NOTE — Telephone Encounter (Signed)
-----   Message from Harle Battiest, PA-C sent at 07/15/2020  7:49 AM EST ----- Please let Walter Hodges know that his blood work is normal.  I would like to see him again in three months with blood work prior (testosterone - one week after injection, HCT, hbg and PSA), I PSS, SHIM and exam.

## 2020-07-16 ENCOUNTER — Other Ambulatory Visit: Payer: Medicare HMO

## 2020-07-18 ENCOUNTER — Encounter: Payer: Self-pay | Admitting: Urology

## 2020-07-18 ENCOUNTER — Ambulatory Visit: Payer: Medicare HMO

## 2020-07-30 ENCOUNTER — Ambulatory Visit (INDEPENDENT_AMBULATORY_CARE_PROVIDER_SITE_OTHER): Payer: Medicare HMO | Admitting: Family Medicine

## 2020-07-30 ENCOUNTER — Other Ambulatory Visit: Payer: Self-pay

## 2020-07-30 DIAGNOSIS — E291 Testicular hypofunction: Secondary | ICD-10-CM | POA: Diagnosis not present

## 2020-07-30 MED ORDER — TESTOSTERONE CYPIONATE 200 MG/ML IM SOLN: 200.0000 mg | Freq: Once | INTRAMUSCULAR | Status: DC

## 2020-07-30 NOTE — Progress Notes (Signed)
Testosterone IM Injection  Due to Hypogonadism patient is present today for a Testosterone Injection.  Medication: Testosterone Cypionate Dose:  Location: right upper outer buttocks Lot: [id number] Exp:12/22  Patient tolerated well, no complications were noted  Preformed by: Teressa Lower, CMA  Follow up: 2 weeks

## 2020-08-01 ENCOUNTER — Ambulatory Visit: Payer: Self-pay

## 2020-08-11 ENCOUNTER — Ambulatory Visit: Payer: Self-pay

## 2020-08-12 ENCOUNTER — Ambulatory Visit (INDEPENDENT_AMBULATORY_CARE_PROVIDER_SITE_OTHER): Payer: Medicare HMO

## 2020-08-12 ENCOUNTER — Other Ambulatory Visit: Payer: Self-pay

## 2020-08-12 DIAGNOSIS — E291 Testicular hypofunction: Secondary | ICD-10-CM | POA: Diagnosis not present

## 2020-08-12 MED ORDER — TESTOSTERONE CYPIONATE 200 MG/ML IM SOLN
150.0000 mg | Freq: Once | INTRAMUSCULAR | Status: AC
  Administered 2020-08-12: 150 mg via INTRAMUSCULAR

## 2020-08-12 NOTE — Progress Notes (Signed)
Testosterone IM Injection  Due to Hypogonadism patient is present today for a Testosterone Injection.  Medication: Testosterone Cypionate Dose:  0.83mL/150mg  Location: right upper outer buttocks Lot: [id number] Exp:01/2022  Patient tolerated well, no complications were noted.  Preformed by: Debbe Bales, CMA   Follow up: RTC in 2 weeks

## 2020-08-25 ENCOUNTER — Other Ambulatory Visit: Payer: Self-pay

## 2020-08-25 ENCOUNTER — Ambulatory Visit: Payer: Medicare HMO

## 2020-08-25 ENCOUNTER — Other Ambulatory Visit: Payer: Self-pay | Admitting: Urology

## 2020-08-26 ENCOUNTER — Ambulatory Visit: Payer: Medicare HMO

## 2020-09-11 ENCOUNTER — Other Ambulatory Visit: Payer: Self-pay

## 2020-09-11 ENCOUNTER — Ambulatory Visit (INDEPENDENT_AMBULATORY_CARE_PROVIDER_SITE_OTHER): Payer: Medicare HMO | Admitting: *Deleted

## 2020-09-11 DIAGNOSIS — E291 Testicular hypofunction: Secondary | ICD-10-CM

## 2020-09-11 MED ORDER — TESTOSTERONE CYPIONATE 200 MG/ML IM SOLN
200.0000 mg | Freq: Once | INTRAMUSCULAR | Status: AC
  Administered 2020-09-11: 200 mg via INTRAMUSCULAR

## 2020-09-11 NOTE — Progress Notes (Signed)
Testosterone IM Injection  Due to Hypogonadism patient is present today for a Testosterone Injection.  Medication: Testosterone Cypionate Dose: .75mg Location: right upper outer buttocks Lot: [id number] Exp:01/2024  Patient tolerated well, no complications were noted  Preformed by: Mattia Osterman CMA  Follow up: 2 weeks 

## 2020-09-15 ENCOUNTER — Ambulatory Visit: Payer: Medicare HMO

## 2020-09-29 ENCOUNTER — Encounter: Payer: Self-pay | Admitting: Urology

## 2020-09-29 ENCOUNTER — Ambulatory Visit: Payer: Self-pay

## 2020-09-30 ENCOUNTER — Ambulatory Visit (INDEPENDENT_AMBULATORY_CARE_PROVIDER_SITE_OTHER): Payer: Medicare HMO | Admitting: *Deleted

## 2020-09-30 ENCOUNTER — Other Ambulatory Visit: Payer: Self-pay

## 2020-09-30 DIAGNOSIS — E291 Testicular hypofunction: Secondary | ICD-10-CM

## 2020-09-30 MED ORDER — TESTOSTERONE CYPIONATE 200 MG/ML IM SOLN
200.0000 mg | Freq: Once | INTRAMUSCULAR | Status: AC
  Administered 2020-09-30: 200 mg via INTRAMUSCULAR

## 2020-09-30 NOTE — Progress Notes (Signed)
Testosterone IM Injection  Due to Hypogonadism patient is present today for a Testosterone Injection.  Medication: Testosterone Cypionate Dose: .75mg  Location: right upper outer buttocks Lot: Exp:01/2024  Patient tolerated well, no complications were noted  Preformed by: 02/2024 CMA  Follow up: 2 weeks

## 2020-10-14 ENCOUNTER — Other Ambulatory Visit: Payer: Self-pay

## 2020-10-14 ENCOUNTER — Ambulatory Visit (INDEPENDENT_AMBULATORY_CARE_PROVIDER_SITE_OTHER): Payer: Medicare HMO | Admitting: *Deleted

## 2020-10-14 DIAGNOSIS — E291 Testicular hypofunction: Secondary | ICD-10-CM

## 2020-10-14 MED ORDER — TESTOSTERONE CYPIONATE 200 MG/ML IM SOLN
200.0000 mg | Freq: Once | INTRAMUSCULAR | Status: AC
  Administered 2020-10-14: 200 mg via INTRAMUSCULAR

## 2020-10-14 NOTE — Progress Notes (Signed)
Testosterone IM Injection  Due to Hypogonadism patient is present today for a Testosterone Injection.  Medication: Testosterone Cypionate Dose:.75  Location: right upper outer buttocks Lot: [id number] Exp:01/2022  Patient tolerated well, no complications were noted  Preformed by: Ples Specter CMA  Follow up: 2 weeks.

## 2020-10-16 ENCOUNTER — Other Ambulatory Visit: Payer: Self-pay

## 2020-10-16 DIAGNOSIS — E291 Testicular hypofunction: Secondary | ICD-10-CM

## 2020-10-16 DIAGNOSIS — N401 Enlarged prostate with lower urinary tract symptoms: Secondary | ICD-10-CM

## 2020-10-16 DIAGNOSIS — E349 Endocrine disorder, unspecified: Secondary | ICD-10-CM

## 2020-10-17 ENCOUNTER — Other Ambulatory Visit: Payer: Self-pay

## 2020-10-20 ENCOUNTER — Ambulatory Visit: Payer: Self-pay

## 2020-10-20 ENCOUNTER — Other Ambulatory Visit
Admission: RE | Admit: 2020-10-20 | Discharge: 2020-10-20 | Disposition: A | Discharge status: Home / Self Care | Payer: Medicare HMO | Source: Ambulatory Visit | Attending: Internal Medicine | Admitting: Internal Medicine

## 2020-10-20 DIAGNOSIS — Z01818 Encounter for other preprocedural examination: Secondary | ICD-10-CM | POA: Insufficient documentation

## 2020-10-20 DIAGNOSIS — Z79899 Other long term (current) drug therapy: Secondary | ICD-10-CM | POA: Diagnosis not present

## 2020-10-20 DIAGNOSIS — I4891 Unspecified atrial fibrillation: Secondary | ICD-10-CM | POA: Diagnosis not present

## 2020-10-20 DIAGNOSIS — R0602 Shortness of breath: Secondary | ICD-10-CM | POA: Insufficient documentation

## 2020-10-20 LAB — BRAIN NATRIURETIC PEPTIDE: B Natriuretic Peptide: 163 pg/mL — ABNORMAL HIGH (ref 0.0–100.0)

## 2020-10-23 NOTE — Progress Notes (Signed)
10/24/2020 9:05 AM   Walter Hodges [birth year] [id number]  Referring provider: Charlott Rakes, MD [address] [place],  [place] [address]  Chief Complaint  Patient presents with  . Erectile Dysfunction  . Benign Prostatic Hypertrophy  . testosterone deficiency   Urological History: 1. Testosterone deficiency - testosterone 369 ng/dL in 07/2020 - managed with testosterone cypionate 200 mg/cc, 0.75 cc every 14 days - has not been diagnosed with sleep apnea  2. ED - SHIM 5 - contributing factors of age, HTN and BPH - failed PDE5i's - managed with Trimix  3. BPH with LU TS - PSA 0.7 ng/mL in 07/2020 - I PSS 8/5 - PVR 61 mL   HPI: Walter Hodges is a 65 y.o. male who presents today for a three month follow up.  He is complaining of nocturia x 3.  He states he does not snore or have apneic events.    IPSS    Row Name 10/24/20 0800         International Prostate Symptom Score   How often have you had the sensation of not emptying your bladder? Less than half the time     How often have you had to urinate less than every two hours? About half the time     How often have you found you stopped and started again several times when you urinated? Not at All     How often have you found it difficult to postpone urination? Not at All     How often have you had a weak urinary stream? Not at All     How often have you had to strain to start urination? Not at All     How many times did you typically get up at night to urinate? 3 Times     Total IPSS Score 8           Quality of Life due to urinary symptoms   If you were to spend the rest of your life with your urinary condition just the way it is now how would you feel about that? Unhappy            Score:  1-7 Mild 8-19 Moderate 20-35 Severe   He states the "needles" are not working.   Patient still having spontaneous erections.  He denies any pain or curvature with erections.    SHIM    Row Name  10/24/20 [phone]         SHIM: Over the last 6 months:   How do you rate your confidence that you could get and keep an erection? Very Low     When you had erections with sexual stimulation, how often were your erections hard enough for penetration (entering your partner)? Almost Never or Never     During sexual intercourse, how often were you able to maintain your erection after you had penetrated (entered) your partner? Almost Never or Never     During sexual intercourse, how difficult was it to maintain your erection to completion of intercourse? Extremely Difficult     When you attempted sexual intercourse, how often was it satisfactory for you? Almost Never or Never           SHIM Total Score   SHIM 5            Score: 1-7 Severe ED 8-11 Moderate ED 12-16 Mild-Moderate ED 17-21 Mild ED 22-25 No ED   PMH: Past Medical History:  Diagnosis  Date  . Arthritis   . Bell's palsy 2010   lasted one day ago, told that he had a ministroke  . Dysrhythmia    atrial fib  . Headache   . Hollenhorst plaque, right eye   . Hypertension    followed by Dr. Velta Addison" with [facility]      Surgical History: Past Surgical History:  Procedure Laterality Date  . CARDIOVERSION N/A 05/29/2020   Procedure: CARDIOVERSION;  Surgeon: Yolonda Kida, MD;  Location: [facility] ORS;  Service: Cardiovascular;  Laterality: N/A;  . CIRCUMCISION     done as a child- given inhalation   . JOINT REPLACEMENT Bilateral   . KNEE CLOSED REDUCTION Left 03/19/2016   Procedure: CLOSED MANIPULATION LEFT KNEE;  Surgeon: Leandrew Koyanagi, MD;  Location: [facility];  Service: Orthopedics;  Laterality: Left;  . TEE WITHOUT CARDIOVERSION N/A 02/19/2016   Procedure: TRANSESOPHAGEAL ECHOCARDIOGRAM (TEE);  Surgeon: Sanda Klein, MD;  Location: [facility];  Service: Cardiovascular;  Laterality: N/A;  . TOTAL KNEE ARTHROPLASTY Right 05/26/2015   Procedure: RIGHT TOTAL KNEE ARTHROPLASTY;  Surgeon: Leandrew Koyanagi, MD;  Location: [facility];  Service: Orthopedics;  Laterality: Right;  . TOTAL KNEE ARTHROPLASTY Left 01/01/2016   Procedure: LEFT TOTAL KNEE ARTHROPLASTY;  Surgeon: Leandrew Koyanagi, MD;  Location: [facility];  Service: Orthopedics;  Laterality: Left;    Home Medications:  Allergies as of 10/24/2020   No Known Allergies     Medication List       Accurate as of October 24, 2020  9:05 AM. If you have any questions, ask your nurse or doctor.        amiodarone 200 MG tablet Commonly known as: PACERONE Take 200 mg by mouth every 12 (twelve) hours.   chlorthalidone 50 MG tablet Commonly known as: HYGROTON Take 50 mg by mouth daily.   Eliquis 5 MG Tabs tablet Generic drug: apixaban Take 5 mg by mouth every 12 (twelve) hours.   ibuprofen 400 MG tablet Commonly known as: ADVIL Take 400 mg by mouth every 6 (six) hours as needed.   isosorbide mononitrate 30 MG 24 hr tablet Commonly known as: IMDUR Take 30 mg by mouth daily.   losartan 100 MG tablet Commonly known as: COZAAR Take 100 mg by mouth daily.   lubiprostone 24 MCG capsule Commonly known as: AMITIZA Take by mouth.   metoprolol succinate 25 MG 24 hr tablet Commonly known as: TOPROL-XL Take 25 mg by mouth daily.   Narcan 4 MG/0.1ML Liqd nasal spray kit Generic drug: naloxone SMARTSIG:1 Spray(s) Both Nares   NONFORMULARY OR COMPOUNDED ITEM Trimix (30/1/50)-(Pap/Phent/PGE) Dosage: Inject 0.8-1 cc per injection 10 1cc vials Qty1 Refills 3 [facility] [phone] Fax [phone] What changed:   how much to take  how to take this  when to take this  reasons to take this   oxyCODONE-acetaminophen 10-325 MG tablet Commonly known as: PERCOCET Take 1 tablet by mouth every 4 (four) hours as needed.   testosterone cypionate 200 MG/ML injection Commonly known as: DEPOTESTOSTERONE CYPIONATE ADMINISTER 0.75 ML IN THE MUSCLE EVERY 14 DAYS       Allergies: No Known Allergies  Family History: Family History  Problem  Relation Age of Onset  . Headache Neg Hx     Social History:  reports that he has never smoked. He has never used smokeless tobacco. He reports that he does not drink alcohol and does not use drugs.  ROS: Pertinent ROS in HPI  Physical Exam: BP  135/73   Pulse 76   Ht 5' 10"  (1.778 m)   Wt 255 lb (115.7 kg)   BMI 36.59 kg/m   Constitutional:  Well nourished. Alert and oriented, No acute distress. HEENT: [place] AT, mask in place.  Trachea midline, no masses. Cardiovascular: No clubbing, cyanosis, or edema. Respiratory: Normal respiratory effort, no increased work of breathing. GU: No CVA tenderness.  No bladder fullness or masses.  Patient with circumcised phallus.  Urethral meatus is patent.  No penile discharge. No penile lesions or rashes. Scrotum without lesions, cysts, rashes and/or edema.  Testicles are located scrotally bilaterally. No masses are appreciated in the testicles. Left and right epididymis are normal. Rectal: Patient with  normal sphincter tone. Anus and perineum without scarring or rashes. No rectal masses are appreciated. Prostate is approximately 60 + grams, could only palpate the apex, no nodules are appreciated. Seminal vesicles could not be palpated.  Lymph: No cervical or inguinal adenopathy. Neurologic: Grossly intact, no focal deficits, moving all 4 extremities. Psychiatric: Normal mood and affect.  Laboratory Data: Specimen:  Blood  Ref Range & Units 3 d ago  Glucose 70 - 110 mg/dL 94   Sodium 136 - 145 mmol/L 143   Potassium 3.6 - 5.1 mmol/L 3.8   Chloride 97 - 109 mmol/L 104   Carbon Dioxide (CO2) 22.0 - 32.0 mmol/L 35.1High   Urea Nitrogen (BUN) 7 - 25 mg/dL 13   Creatinine 0.7 - 1.3 mg/dL 0.8   Glomerular Filtration Rate (eGFR), MDRD Estimate >60 mL/min/1.73sq m 118   Calcium 8.7 - 10.3 mg/dL 9.0   AST  8 - 39 U/L 16   ALT  6 - 57 U/L 18   Alk Phos (alkaline Phosphatase) 34 - 104 U/L 77   Albumin 3.5 - 4.8 g/dL 4.3   Bilirubin, Total 0.3 - 1.2  mg/dL 0.9   Protein, Total 6.1 - 7.9 g/dL 7.1   A/G Ratio 1.0 - 5.0 gm/dL 1.5   Resulting Agency  [facility]  Specimen Collected: 10/20/20 10:06 AM Last Resulted: 10/20/20 10:51 AM  Received From: [facility]  Result Received: 10/23/20 1:22 PM   Specimen:  Blood  Ref Range & Units 3 d ago  WBC (White Blood Cell Count) 4.1 - 10.2 10^3/uL 5.7   RBC (Red Blood Cell Count) 4.69 - 6.13 10^6/uL 5.10   Hemoglobin 14.1 - 18.1 gm/dL 14.9   Hematocrit 40.0 - 52.0 % 45.5   MCV (Mean Corpuscular Volume) 80.0 - 100.0 fl 89.2   MCH (Mean Corpuscular Hemoglobin) 27.0 - 31.2 pg 29.2   MCHC (Mean Corpuscular Hemoglobin Concentration) 32.0 - 36.0 gm/dL 32.7   Platelet Count 150 - 450 10^3/uL 265   RDW-CV (Red Cell Distribution Width) 11.6 - 14.8 % 13.0   MPV (Mean Platelet Volume) 9.4 - 12.4 fl 9.6   Neutrophils 1.50 - 7.80 10^3/uL 3.08   Lymphocytes 1.00 - 3.60 10^3/uL 1.97   Monocytes 0.00 - 1.50 10^3/uL 0.61   Eosinophils 0.00 - 0.55 10^3/uL 0.01   Basophils 0.00 - 0.09 10^3/uL 0.02   Neutrophil % 32.0 - 70.0 % 53.9   Lymphocyte % 10.0 - 50.0 % 34.6   Monocyte % 4.0 - 13.0 % 10.7   Eosinophil % 1.0 - 5.0 % 0.2Low   Basophil% 0.0 - 2.0 % 0.4   Immature Granulocyte % <=0.7 % 0.2   Immature Granulocyte Count <=0.06 10^3/L 0.01   Resulting [facility]  Specimen Collected: 10/20/20 10:06 AM Last Resulted: 10/20/20 10:22 AM  Received From: [facility]  Result Received: 10/23/20 1:22 PM    Lab Results  Component Value Date   TESTOSTERONE 369 07/14/2020  I have reviewed the labs.   Pertinent Imaging: Results for BROWN, DUNLAP (MRN [id number]) as of 10/24/2020 08:59  Ref. Range 10/24/2020 08:39  Scan Result Unknown 61    Assessment & Plan:    1. Testosterone deficiency  -most recent testosterone level is pending -continue testosterone cypionate 200 mg/cc, 0.75 cc every 14 days  2. BPH with LUTS - IPSS  score is 8/5, it is worsening - Continue conservative management, avoiding bladder irritants and timed voiding's -We will not prescribe any medications for his nocturia as he is on nitroglycerin products and cannot take PDE 5 inhibitors and he does not want medications that could affect his sex life  3. Erectile dysfunction:    -SHIM score is 5 -He would like to try the Trimix injections again as he feels the medication was expired and that is why it was not effective -We [facility] in [facility], he is injecting 7 mcg -I also gave him another DVD for the penile prosthesis, so that he and his wife can watch it together and make a decision regarding whether or not he would like to proceed with the prosthesis   Return in about 3 months (around 01/21/2021) for  testosterone (one week after injection) H & H.  These notes generated with voice recognition software. I apologize for typographical errors.  Zara Council, PA-C  [facility] [address]  [place], [place] [address] [phone]

## 2020-10-24 ENCOUNTER — Ambulatory Visit (INDEPENDENT_AMBULATORY_CARE_PROVIDER_SITE_OTHER): Payer: Medicare HMO | Admitting: Urology

## 2020-10-24 ENCOUNTER — Other Ambulatory Visit: Payer: Self-pay

## 2020-10-24 ENCOUNTER — Encounter: Payer: Self-pay | Admitting: Urology

## 2020-10-24 VITALS — BP 135/73 | HR 76 | Ht 70.0 in | Wt 255.0 lb

## 2020-10-24 DIAGNOSIS — N138 Other obstructive and reflux uropathy: Secondary | ICD-10-CM | POA: Diagnosis not present

## 2020-10-24 DIAGNOSIS — N5201 Erectile dysfunction due to arterial insufficiency: Secondary | ICD-10-CM

## 2020-10-24 DIAGNOSIS — N401 Enlarged prostate with lower urinary tract symptoms: Secondary | ICD-10-CM | POA: Diagnosis not present

## 2020-10-24 DIAGNOSIS — E349 Endocrine disorder, unspecified: Secondary | ICD-10-CM

## 2020-10-24 LAB — BLADDER SCAN AMB NON-IMAGING: Scan Result: 61

## 2020-10-24 MED ORDER — TESTOSTERONE CYPIONATE 200 MG/ML IM SOLN
150.0000 mg | Freq: Once | INTRAMUSCULAR | Status: AC
  Administered 2020-10-24: 150 mg via INTRAMUSCULAR

## 2020-10-24 NOTE — Addendum Note (Signed)
Addended by: Milas Kocher A on: 10/24/2020 10:15 AM   Modules accepted: Orders

## 2020-10-25 LAB — HEMOGLOBIN AND HEMATOCRIT, BLOOD
Hematocrit: 44.5 % (ref 37.5–51.0)
Hemoglobin: 15.1 g/dL (ref 13.0–17.7)

## 2020-10-25 LAB — PSA: Prostate Specific Ag, Serum: 0.9 ng/mL (ref 0.0–4.0)

## 2020-10-25 LAB — TESTOSTERONE: Testosterone: 693 ng/dL (ref 264–916)

## 2020-10-27 ENCOUNTER — Telehealth: Payer: Self-pay | Admitting: Family Medicine

## 2020-10-27 MED ORDER — NONFORMULARY OR COMPOUNDED ITEM: 3 refills | Status: DC

## 2020-10-27 NOTE — Telephone Encounter (Signed)
-----   Message from Harle Battiest, PA-C sent at 10/26/2020  8:49 PM EST ----- Please let Mr. Spagnoli know that his blood work was normal.

## 2020-10-27 NOTE — Telephone Encounter (Signed)
Patient notified and voiced understanding. He is requesting a refill of Trimix the RX was faxed.

## 2020-10-28 ENCOUNTER — Ambulatory Visit: Payer: Self-pay

## 2020-11-04 ENCOUNTER — Other Ambulatory Visit: Payer: Self-pay

## 2020-11-04 ENCOUNTER — Other Ambulatory Visit
Admission: RE | Admit: 2020-11-04 | Discharge: 2020-11-04 | Disposition: A | Discharge status: Home / Self Care | Payer: Medicare HMO | Source: Ambulatory Visit | Attending: Internal Medicine | Admitting: Internal Medicine

## 2020-11-04 DIAGNOSIS — Z20822 Contact with and (suspected) exposure to covid-19: Secondary | ICD-10-CM | POA: Insufficient documentation

## 2020-11-04 DIAGNOSIS — Z01812 Encounter for preprocedural laboratory examination: Secondary | ICD-10-CM | POA: Diagnosis present

## 2020-11-04 LAB — SARS CORONAVIRUS 2 (TAT 6-24 HRS): SARS Coronavirus 2: NEGATIVE

## 2020-11-06 ENCOUNTER — Encounter
Admission: RE | Disposition: A | Discharge status: Home / Self Care | Payer: Self-pay | Source: Home / Self Care | Attending: Internal Medicine

## 2020-11-06 ENCOUNTER — Other Ambulatory Visit: Payer: Self-pay

## 2020-11-06 ENCOUNTER — Ambulatory Visit
Admission: RE | Admit: 2020-11-06 | Discharge: 2020-11-06 | Disposition: A | Discharge status: Home / Self Care | Payer: Medicare HMO | Attending: Internal Medicine | Admitting: Internal Medicine

## 2020-11-06 ENCOUNTER — Encounter: Payer: Self-pay | Admitting: Internal Medicine

## 2020-11-06 DIAGNOSIS — I200 Unstable angina: Secondary | ICD-10-CM | POA: Diagnosis present

## 2020-11-06 HISTORY — PX: LEFT HEART CATH AND CORONARY ANGIOGRAPHY: CATH118249

## 2020-11-06 SURGERY — LEFT HEART CATH AND CORONARY ANGIOGRAPHY
Anesthesia: Moderate Sedation

## 2020-11-06 MED ORDER — VERAPAMIL HCL 2.5 MG/ML IV SOLN
INTRAVENOUS | Status: AC
  Filled 2020-11-06: qty 2

## 2020-11-06 MED ORDER — HEPARIN SODIUM (PORCINE) 1000 UNIT/ML IJ SOLN
INTRAMUSCULAR | Status: AC
  Filled 2020-11-06: qty 1

## 2020-11-06 MED ORDER — SODIUM CHLORIDE 0.9 % WEIGHT BASED INFUSION
3.0000 mL/kg/h | INTRAVENOUS | Status: AC
  Administered 2020-11-06: 3 mL/kg/h via INTRAVENOUS

## 2020-11-06 MED ORDER — SODIUM CHLORIDE 0.9% FLUSH: 3.0000 mL | INTRAVENOUS | Status: DC | PRN

## 2020-11-06 MED ORDER — SODIUM CHLORIDE 0.9% FLUSH: 3.0000 mL | Freq: Two times a day (BID) | INTRAVENOUS | Status: DC

## 2020-11-06 MED ORDER — LABETALOL HCL 5 MG/ML IV SOLN: 10.0000 mg | INTRAVENOUS | Status: DC | PRN

## 2020-11-06 MED ORDER — FENTANYL CITRATE (PF) 100 MCG/2ML IJ SOLN
INTRAMUSCULAR | Status: DC | PRN
  Administered 2020-11-06: 25 ug via INTRAVENOUS
  Administered 2020-11-06: 50 ug via INTRAVENOUS

## 2020-11-06 MED ORDER — ACETAMINOPHEN 325 MG PO TABS: 650.0000 mg | ORAL_TABLET | ORAL | Status: DC | PRN

## 2020-11-06 MED ORDER — MIDAZOLAM HCL 2 MG/2ML IJ SOLN
INTRAMUSCULAR | Status: DC | PRN
  Administered 2020-11-06 (×2): 1 mg via INTRAVENOUS

## 2020-11-06 MED ORDER — ASPIRIN 81 MG PO CHEW
81.0000 mg | CHEWABLE_TABLET | ORAL | Status: AC
  Administered 2020-11-06: 81 mg via ORAL

## 2020-11-06 MED ORDER — FENTANYL CITRATE (PF) 100 MCG/2ML IJ SOLN
INTRAMUSCULAR | Status: AC
  Filled 2020-11-06: qty 2

## 2020-11-06 MED ORDER — SODIUM CHLORIDE 0.9 % WEIGHT BASED INFUSION
1.0000 mL/kg/h | INTRAVENOUS | Status: DC
  Administered 2020-11-06: 1 mL/kg/h via INTRAVENOUS

## 2020-11-06 MED ORDER — SODIUM CHLORIDE 0.9 % WEIGHT BASED INFUSION: 1.0000 mL/kg/h | INTRAVENOUS | Status: DC

## 2020-11-06 MED ORDER — LIDOCAINE HCL (PF) 1 % IJ SOLN
INTRAMUSCULAR | Status: DC | PRN
  Administered 2020-11-06: 3 mL

## 2020-11-06 MED ORDER — SODIUM CHLORIDE 0.9 % IV SOLN: 250.0000 mL | INTRAVENOUS | Status: DC | PRN

## 2020-11-06 MED ORDER — MIDAZOLAM HCL 2 MG/2ML IJ SOLN
INTRAMUSCULAR | Status: AC
  Filled 2020-11-06: qty 2

## 2020-11-06 MED ORDER — IOHEXOL 300 MG/ML  SOLN
INTRAMUSCULAR | Status: DC | PRN
  Administered 2020-11-06: 52 mL

## 2020-11-06 MED ORDER — HYDRALAZINE HCL 20 MG/ML IJ SOLN: 10.0000 mg | INTRAMUSCULAR | Status: DC | PRN

## 2020-11-06 MED ORDER — LIDOCAINE HCL (PF) 1 % IJ SOLN
INTRAMUSCULAR | Status: AC
  Filled 2020-11-06: qty 30

## 2020-11-06 MED ORDER — ONDANSETRON HCL 4 MG/2ML IJ SOLN: 4.0000 mg | Freq: Four times a day (QID) | INTRAMUSCULAR | Status: DC | PRN

## 2020-11-06 MED ORDER — HEPARIN (PORCINE) IN NACL 2000-0.9 UNIT/L-% IV SOLN
INTRAVENOUS | Status: DC | PRN
  Administered 2020-11-06: 1000 mL

## 2020-11-06 MED ORDER — HEPARIN (PORCINE) IN NACL 1000-0.9 UT/500ML-% IV SOLN
INTRAVENOUS | Status: AC
  Filled 2020-11-06: qty 1000

## 2020-11-06 MED ORDER — ASPIRIN 81 MG PO CHEW
CHEWABLE_TABLET | ORAL | Status: AC
  Filled 2020-11-06: qty 1

## 2020-11-06 SURGICAL SUPPLY — 14 items
CATH INFINITI 5FR JL4 (CATHETERS) ×2 IMPLANT
CATH INFINITI JR4 5F (CATHETERS) ×2 IMPLANT
DEVICE CLOSURE MYNXGRIP 5F (Vascular Products) ×2 IMPLANT
DEVICE RAD TR BAND REGULAR (VASCULAR PRODUCTS) ×2 IMPLANT
GLIDESHEATH SLEND SS 6F .021 (SHEATH) ×4 IMPLANT
GUIDEWIRE INQWIRE 1.5J.035X260 (WIRE) ×1 IMPLANT
INQWIRE 1.5J .035X260CM (WIRE) ×2
NEEDLE PERC 18GX7CM (NEEDLE) ×2 IMPLANT
PACK CARDIAC CATH (CUSTOM PROCEDURE TRAY) ×2 IMPLANT
PROTECTION STATION PRESSURIZED (MISCELLANEOUS) ×2
SET ATX SIMPLICITY (MISCELLANEOUS) ×2 IMPLANT
SHEATH AVANTI 5FR X 11CM (SHEATH) ×2 IMPLANT
STATION PROTECTION PRESSURIZED (MISCELLANEOUS) ×1 IMPLANT
WIRE GUIDERIGHT .035X150 (WIRE) ×2 IMPLANT

## 2020-11-06 NOTE — Discharge Instructions (Signed)
Moderate Conscious Sedation, Adult, Care After This sheet gives you information about how to care for yourself after your procedure. Your health care provider may also give you more specific instructions. If you have problems or questions, contact your health care provider. What can I expect after the procedure? After the procedure, it is common to have:  Sleepiness for several hours.  Impaired judgment for several hours.  Difficulty with balance.  Vomiting if you eat too soon. Follow these instructions at home: For the time period you were told by your health care provider:  Rest.  Do not participate in activities where you could fall or become injured.  Do not drive or use machinery.  Do not drink alcohol.  Do not take sleeping pills or medicines that cause drowsiness.  Do not make important decisions or sign legal documents.  Do not take care of children on your own.      Eating and drinking  Follow the diet recommended by your health care provider.  Drink enough fluid to keep your urine pale yellow.  If you vomit: ? Drink water, juice, or soup when you can drink without vomiting. ? Make sure you have little or no nausea before eating solid foods.   General instructions  Take over-the-counter and prescription medicines only as told by your health care provider.  Have a responsible adult stay with you for the time you are told. It is important to have someone help care for you until you are awake and alert.  Do not smoke.  Keep all follow-up visits as told by your health care provider. This is important. Contact a health care provider if:  You are still sleepy or having trouble with balance after 24 hours.  You feel light-headed.  You keep feeling nauseous or you keep vomiting.  You develop a rash.  You have a fever.  You have redness or swelling around the IV site. Get help right away if:  You have trouble breathing.  You have new-onset confusion at  home. Summary  After the procedure, it is common to feel sleepy, have impaired judgment, or feel nauseous if you eat too soon.  Rest after you get home. Know the things you should not do after the procedure.  Follow the diet recommended by your health care provider and drink enough fluid to keep your urine pale yellow.  Get help right away if you have trouble breathing or new-onset confusion at home. This information is not intended to replace advice given to you by your health care provider. Make sure you discuss any questions you have with your health care provider. Document Revised: 12/21/2019 Document Reviewed: 07/19/2019 Elsevier Patient Education  2021 [employer] Femoral Site Care  This sheet gives you information about how to care for yourself after your procedure. Your health care provider may also give you more specific instructions. If you have problems or questions, contact your health care provider. What can I expect after the procedure? After the procedure, it is common to have:  Bruising that usually fades within 1-2 weeks.  Tenderness at the site. Follow these instructions at home: Wound care  Follow instructions from your health care provider about how to take care of your insertion site. Make sure you: ? Wash your hands with soap and water before you change your bandage (dressing). If soap and water are not available, use hand sanitizer. ? Change your dressing as told by your health care provider. ? Leave stitches (sutures), skin glue, or   adhesive strips in place. These skin closures may need to stay in place for 2 weeks or longer. If adhesive strip edges start to loosen and curl up, you may trim the loose edges. Do not remove adhesive strips completely unless your health care provider tells you to do that.  Do not take baths, swim, or use a hot tub until your health care provider approves.  You may shower 24-48 hours after the procedure or as told by your health  care provider. ? Gently wash the site with plain soap and water. ? Pat the area dry with a clean towel. ? Do not rub the site. This may cause bleeding.  Do not apply powder or lotion to the site. Keep the site clean and dry.  Check your femoral site every day for signs of infection. Check for: ? Redness, swelling, or pain. ? Fluid or blood. ? Warmth. ? Pus or a bad smell. Activity  For the first 2-3 days after your procedure, or as long as directed: ? Avoid climbing stairs as much as possible. ? Do not squat.  Do not lift anything that is heavier than 10 lb (4.5 kg), or the limit that you are told, until your health care provider says that it is safe.  Rest as directed. ? Avoid sitting for a long time without moving. Get up to take short walks every 1-2 hours.  Do not drive for 24 hours if you were given a medicine to help you relax (sedative). General instructions  Take over-the-counter and prescription medicines only as told by your health care provider.  Keep all follow-up visits as told by your health care provider. This is important. Contact a health care provider if you have:  A fever or chills.  You have redness, swelling, or pain around your insertion site. Get help right away if:  The catheter insertion area swells very fast.  You pass out.  You suddenly start to sweat or your skin gets clammy.  The catheter insertion area is bleeding, and the bleeding does not stop when you hold steady pressure on the area.  The area near or just beyond the catheter insertion site becomes pale, cool, tingly, or numb. These symptoms may represent a serious problem that is an emergency. Do not wait to see if the symptoms will go away. Get medical help right away. Call your local emergency services (911 in the [place] Do not drive yourself to the hospital. Summary  After the procedure, it is common to have bruising that usually fades within 1-2 weeks.  Check your femoral site  every day for signs of infection.  Do not lift anything that is heavier than 10 lb (4.5 kg), or the limit that you are told, until your health care provider says that it is safe. This information is not intended to replace advice given to you by your health care provider. Make sure you discuss any questions you have with your health care provider. Document Revised: 04/25/2020 Document Reviewed: 04/25/2020 Elsevier Patient Education  2021 [employer] Radial Site Care  This sheet gives you information about how to care for yourself after your procedure. Your health care provider may also give you more specific instructions. If you have problems or questions, contact your health care provider. What can I expect after the procedure? After the procedure, it is common to have:  Bruising and tenderness at the catheter insertion area. Follow these instructions at home: Medicines  Take over-the-counter and prescription medicines only as  told by your health care provider. Insertion site care  Follow instructions from your health care provider about how to take care of your insertion site. Make sure you: ? Wash your hands with soap and water before you change your bandage (dressing). If soap and water are not available, use hand sanitizer. ? Change your dressing as told by your health care provider. ? Leave stitches (sutures), skin glue, or adhesive strips in place. These skin closures may need to stay in place for 2 weeks or longer. If adhesive strip edges start to loosen and curl up, you may trim the loose edges. Do not remove adhesive strips completely unless your health care provider tells you to do that.  Check your insertion site every day for signs of infection. Check for: ? Redness, swelling, or pain. ? Fluid or blood. ? Pus or a bad smell. ? Warmth.  Do not take baths, swim, or use a hot tub until your health care provider approves.  You may shower 24-48 hours after the procedure, or  as directed by your health care provider. ? Remove the dressing and gently wash the site with plain soap and water. ? Pat the area dry with a clean towel. ? Do not rub the site. That could cause bleeding.  Do not apply powder or lotion to the site. Activity  For 24 hours after the procedure, or as directed by your health care provider: ? Do not flex or bend the affected arm. ? Do not push or pull heavy objects with the affected arm. ? Do not drive yourself home from the hospital or clinic. You may drive 24 hours after the procedure unless your health care provider tells you not to. ? Do not operate machinery or power tools.  Do not lift anything that is heavier than 10 lb (4.5 kg), or the limit that you are told, until your health care provider says that it is safe.  Ask your health care provider when it is okay to: ? Return to work or school. ? Resume usual physical activities or sports. ? Resume sexual activity.   General instructions  If the catheter site starts to bleed, raise your arm and put firm pressure on the site. If the bleeding does not stop, get help right away. This is a medical emergency.  If you went home on the same day as your procedure, a responsible adult should be with you for the first 24 hours after you arrive home.  Keep all follow-up visits as told by your health care provider. This is important. Contact a health care provider if:  You have a fever.  You have redness, swelling, or yellow drainage around your insertion site. Get help right away if:  You have unusual pain at the radial site.  The catheter insertion area swells very fast.  The insertion area is bleeding, and the bleeding does not stop when you hold steady pressure on the area.  Your arm or hand becomes pale, cool, tingly, or numb. These symptoms may represent a serious problem that is an emergency. Do not wait to see if the symptoms will go away. Get medical help right away. Call your  local emergency services (911 in the [place] Do not drive yourself to the hospital. Summary  After the procedure, it is common to have bruising and tenderness at the site.  Follow instructions from your health care provider about how to take care of your radial site wound. Check the wound every day for  signs of infection.  Do not lift anything that is heavier than 10 lb (4.5 kg), or the limit that you are told, until your health care provider says that it is safe. This information is not intended to replace advice given to you by your health care provider. Make sure you discuss any questions you have with your health care provider. Document Revised: 09/28/2017 Document Reviewed: 09/28/2017 Elsevier Patient Education  2021 [employer].

## 2020-11-07 ENCOUNTER — Ambulatory Visit: Payer: Self-pay

## 2020-11-07 ENCOUNTER — Encounter: Payer: Self-pay | Admitting: Internal Medicine

## 2020-11-11 ENCOUNTER — Ambulatory Visit (INDEPENDENT_AMBULATORY_CARE_PROVIDER_SITE_OTHER): Payer: Medicare HMO

## 2020-11-11 ENCOUNTER — Other Ambulatory Visit: Payer: Self-pay

## 2020-11-11 DIAGNOSIS — E349 Endocrine disorder, unspecified: Secondary | ICD-10-CM | POA: Diagnosis not present

## 2020-11-11 MED ORDER — TESTOSTERONE CYPIONATE 200 MG/ML IM SOLN
150.0000 mg | Freq: Once | INTRAMUSCULAR | Status: AC
  Administered 2020-11-11: 150 mg via INTRAMUSCULAR

## 2020-11-11 NOTE — Progress Notes (Signed)
Testosterone IM Injection  Due to Hypogonadism patient is present today for a Testosterone Injection.  Medication: Testosterone Cypionate Dose: 0.75ml (150mg ) Location: right upper outer buttocks Lot:  Exp:01/2022  Patient tolerated well, no complications were noted.  Preformed by: 02/2022, CMA   Follow up: RTC in 2 weeks for testosterone injection

## 2020-11-25 ENCOUNTER — Ambulatory Visit (INDEPENDENT_AMBULATORY_CARE_PROVIDER_SITE_OTHER): Payer: Medicare HMO | Admitting: *Deleted

## 2020-11-25 ENCOUNTER — Other Ambulatory Visit: Payer: Self-pay

## 2020-11-25 DIAGNOSIS — E349 Endocrine disorder, unspecified: Secondary | ICD-10-CM

## 2020-11-25 MED ORDER — TESTOSTERONE CYPIONATE 200 MG/ML IM SOLN
200.0000 mg | Freq: Once | INTRAMUSCULAR | Status: AC
  Administered 2020-11-25: 200 mg via INTRAMUSCULAR

## 2020-11-25 NOTE — Progress Notes (Signed)
Testosterone IM Injection  Due to Hypogonadism patient is present today for a Testosterone Injection.  Medication: Testosterone Cypionate Dose: 0.75 Location: right upper outer buttocks Lot: [id number] Exp:01/2022  Patient tolerated well, no complications were noted  Preformed by: Ples Specter CMA  Follow up: 2 week follow up and bring medication.

## 2020-12-09 ENCOUNTER — Other Ambulatory Visit: Payer: Self-pay

## 2020-12-09 ENCOUNTER — Other Ambulatory Visit: Payer: Self-pay | Admitting: *Deleted

## 2020-12-09 ENCOUNTER — Ambulatory Visit: Payer: Medicare HMO | Admitting: *Deleted

## 2020-12-09 MED ORDER — TESTOSTERONE CYPIONATE 200 MG/ML IM SOLN: 150.0000 mg | INTRAMUSCULAR | 0 refills | Status: DC

## 2020-12-17 ENCOUNTER — Other Ambulatory Visit: Payer: Self-pay

## 2020-12-17 ENCOUNTER — Ambulatory Visit (INDEPENDENT_AMBULATORY_CARE_PROVIDER_SITE_OTHER): Payer: Medicare HMO

## 2020-12-17 DIAGNOSIS — E349 Endocrine disorder, unspecified: Secondary | ICD-10-CM

## 2020-12-17 MED ORDER — TESTOSTERONE CYPIONATE 200 MG/ML IM SOLN
150.0000 mg | Freq: Once | INTRAMUSCULAR | Status: AC
  Administered 2020-12-17: 150 mg via INTRAMUSCULAR

## 2020-12-17 NOTE — Progress Notes (Signed)
ERROR

## 2020-12-17 NOTE — Progress Notes (Signed)
Testosterone IM Injection  Due to Hypogonadism patient is present today for a Testosterone Injection.  Medication: Testosterone Cypionate Dose: 0.40ml Location: left upper outer buttocks Lot: [id number] Exp:04/23  Patient tolerated well, no complications  Preformed by: Eligha Bridegroom, CMA  Follow up: 2wk injection

## 2021-01-02 ENCOUNTER — Ambulatory Visit: Payer: Medicare HMO

## 2021-01-06 ENCOUNTER — Ambulatory Visit: Payer: Medicare HMO

## 2021-01-19 ENCOUNTER — Ambulatory Visit (INDEPENDENT_AMBULATORY_CARE_PROVIDER_SITE_OTHER): Payer: Medicare HMO

## 2021-01-19 ENCOUNTER — Other Ambulatory Visit: Payer: Self-pay

## 2021-01-19 DIAGNOSIS — E349 Endocrine disorder, unspecified: Secondary | ICD-10-CM

## 2021-01-19 MED ORDER — TESTOSTERONE CYPIONATE 200 MG/ML IM SOLN
150.0000 mg | Freq: Once | INTRAMUSCULAR | Status: AC
Start: 2021-01-19 — End: 2021-01-19
  Administered 2021-01-19: 150 mg via INTRAMUSCULAR

## 2021-01-19 NOTE — Progress Notes (Signed)
Testosterone IM Injection  Due to Hypogonadism patient is present today for a Testosterone Injection.  Medication: Testosterone Cypionate Dose: 0.67mL  Location: right upper outer buttocks Lot: [id number] Exp:07/2022  Patient tolerated well, no complications were noted.  Preformed by: Debbe Bales, CMA   Follow up: RTC on Monday 01/26/21 for labs

## 2021-01-23 ENCOUNTER — Other Ambulatory Visit: Payer: Self-pay

## 2021-01-26 ENCOUNTER — Other Ambulatory Visit: Payer: Self-pay

## 2021-01-26 DIAGNOSIS — E349 Endocrine disorder, unspecified: Secondary | ICD-10-CM

## 2021-01-27 ENCOUNTER — Other Ambulatory Visit: Payer: Medicare HMO

## 2021-01-27 ENCOUNTER — Other Ambulatory Visit: Payer: Self-pay

## 2021-01-27 ENCOUNTER — Telehealth: Payer: Self-pay | Admitting: *Deleted

## 2021-01-27 DIAGNOSIS — E349 Endocrine disorder, unspecified: Secondary | ICD-10-CM

## 2021-01-27 NOTE — Telephone Encounter (Signed)
Patient left message wanting his trimix sent to [facility]

## 2021-01-27 NOTE — Telephone Encounter (Signed)
Ok to send to [facility]

## 2021-01-28 ENCOUNTER — Telehealth: Payer: Self-pay | Admitting: *Deleted

## 2021-01-28 DIAGNOSIS — E291 Testicular hypofunction: Secondary | ICD-10-CM

## 2021-01-28 DIAGNOSIS — E349 Endocrine disorder, unspecified: Secondary | ICD-10-CM

## 2021-01-28 DIAGNOSIS — N138 Other obstructive and reflux uropathy: Secondary | ICD-10-CM

## 2021-01-28 LAB — HEMOGLOBIN AND HEMATOCRIT, BLOOD
Hematocrit: 46.5 % (ref 37.5–51.0)
Hemoglobin: 15.2 g/dL (ref 13.0–17.7)

## 2021-01-28 LAB — TESTOSTERONE: Testosterone: 1024 ng/dL — ABNORMAL HIGH (ref 264–916)

## 2021-01-28 LAB — PSA: Prostate Specific Ag, Serum: 1.6 ng/mL (ref 0.0–4.0)

## 2021-01-28 MED ORDER — NONFORMULARY OR COMPOUNDED ITEM: 3 refills | Status: DC

## 2021-01-28 NOTE — Telephone Encounter (Signed)
Spoke with patient and advised results, appts made.

## 2021-01-28 NOTE — Addendum Note (Signed)
Addended by: Honor Loh on: 01/28/2021 09:50 AM   Modules accepted: Orders

## 2021-01-28 NOTE — Telephone Encounter (Signed)
-----   Message from Harle Battiest, PA-C sent at 01/28/2021  8:24 AM EDT ----- Walter Hodges testosterone level is elevated.  We need to decrease his testosterone dose to 0.5 cc every 14 days.  I will need to see him in August for IPSS, SH IM and exam.  We will need repeat blood work prior to that appointment with a testosterone level, hemoglobin, hematocrit and PSA.

## 2021-02-03 ENCOUNTER — Other Ambulatory Visit: Payer: Self-pay

## 2021-02-03 ENCOUNTER — Ambulatory Visit (INDEPENDENT_AMBULATORY_CARE_PROVIDER_SITE_OTHER): Payer: Medicare HMO | Admitting: *Deleted

## 2021-02-03 DIAGNOSIS — E349 Endocrine disorder, unspecified: Secondary | ICD-10-CM | POA: Diagnosis not present

## 2021-02-03 MED ORDER — TESTOSTERONE CYPIONATE 200 MG/ML IM SOLN
200.0000 mg | Freq: Once | INTRAMUSCULAR | Status: AC
  Administered 2021-02-03: 200 mg via INTRAMUSCULAR

## 2021-02-03 NOTE — Progress Notes (Signed)
Testosterone IM Injection  Due to Hypogonadism patient is present today for a Testosterone Injection.  Medication: Testosterone Cypionate Dose: 0.75 Location: right upper outer buttocks Lot: [id number] Exp:12/2021  Patient tolerated well, no complications were noted  Preformed by: Ples Specter CMA   Follow up: 2 weeks

## 2021-02-10 DIAGNOSIS — M19019 Primary osteoarthritis, unspecified shoulder: Secondary | ICD-10-CM | POA: Insufficient documentation

## 2021-02-10 DIAGNOSIS — G5602 Carpal tunnel syndrome, left upper limb: Secondary | ICD-10-CM | POA: Insufficient documentation

## 2021-02-17 ENCOUNTER — Ambulatory Visit: Payer: Self-pay

## 2021-02-20 ENCOUNTER — Encounter: Payer: Self-pay | Admitting: Urology

## 2021-03-02 ENCOUNTER — Ambulatory Visit: Payer: Medicare HMO

## 2021-03-03 ENCOUNTER — Other Ambulatory Visit: Payer: Self-pay

## 2021-03-03 ENCOUNTER — Ambulatory Visit (INDEPENDENT_AMBULATORY_CARE_PROVIDER_SITE_OTHER): Payer: Medicare HMO | Admitting: *Deleted

## 2021-03-03 DIAGNOSIS — E349 Endocrine disorder, unspecified: Secondary | ICD-10-CM | POA: Diagnosis not present

## 2021-03-03 MED ORDER — TESTOSTERONE CYPIONATE 200 MG/ML IM SOLN
200.0000 mg | Freq: Once | INTRAMUSCULAR | Status: AC
  Administered 2021-03-03: 200 mg via INTRAMUSCULAR

## 2021-03-03 NOTE — Progress Notes (Signed)
Testosterone IM Injection   Due to Hypogonadism patient is present today for a Testosterone Injection.   Medication: Testosterone Cypionate Dose: 0.75 Location: right upper outer buttocks Lot: [id number] Exp:07/2022   Patient tolerated well, no complications were noted   Preformed by: Ples Specter CMA    Follow up: 2 weeks

## 2021-03-05 ENCOUNTER — Encounter: Payer: Self-pay | Admitting: *Deleted

## 2021-03-17 ENCOUNTER — Ambulatory Visit: Payer: Self-pay

## 2021-03-24 ENCOUNTER — Ambulatory Visit (INDEPENDENT_AMBULATORY_CARE_PROVIDER_SITE_OTHER): Payer: Medicare HMO | Admitting: Family Medicine

## 2021-03-24 ENCOUNTER — Other Ambulatory Visit: Payer: Self-pay

## 2021-03-24 DIAGNOSIS — E349 Endocrine disorder, unspecified: Secondary | ICD-10-CM | POA: Diagnosis not present

## 2021-03-24 MED ORDER — TESTOSTERONE CYPIONATE 200 MG/ML IM SOLN
200.0000 mg | Freq: Once | INTRAMUSCULAR | Status: AC
  Administered 2021-03-24: 200 mg via INTRAMUSCULAR

## 2021-03-24 NOTE — Progress Notes (Signed)
Testosterone IM Injection  Due to Hypogonadism patient is present today for a Testosterone Injection.  Medication: Testosterone Cypionate Dose: 0.50ml Location: right upper outer buttocks Lot: [id number] Exp:07/2022  Patient tolerated well, no complications were noted  Preformed by: Teressa Lower, CM  Follow up: 2 weeks

## 2021-04-07 ENCOUNTER — Other Ambulatory Visit: Payer: Self-pay

## 2021-04-07 ENCOUNTER — Ambulatory Visit (INDEPENDENT_AMBULATORY_CARE_PROVIDER_SITE_OTHER): Payer: Medicare HMO | Admitting: *Deleted

## 2021-04-07 DIAGNOSIS — E349 Endocrine disorder, unspecified: Secondary | ICD-10-CM | POA: Diagnosis not present

## 2021-04-07 MED ORDER — TESTOSTERONE CYPIONATE 200 MG/ML IM SOLN
200.0000 mg | Freq: Once | INTRAMUSCULAR | Status: AC
  Administered 2021-04-07: 200 mg via INTRAMUSCULAR

## 2021-04-07 NOTE — Progress Notes (Signed)
Testosterone IM Injection   Due to Hypogonadism patient is present today for a Testosterone Injection.   Medication: Testosterone Cypionate Dose: 0.86ml Location: left upper outer buttocks Lot: [id number] Exp:07/2022   Patient tolerated well, no complications were noted   Preformed by: Ples Specter CMA   Follow up: 2 weeks

## 2021-04-16 ENCOUNTER — Other Ambulatory Visit: Payer: Self-pay | Admitting: Urology

## 2021-04-21 ENCOUNTER — Telehealth: Payer: Self-pay

## 2021-04-21 ENCOUNTER — Ambulatory Visit: Payer: Self-pay

## 2021-04-21 NOTE — Telephone Encounter (Signed)
Pt calls and states he has 3 upcoming appts at our office within the next week and questions of we can condense these appointments for convenience. Advised pt I would rearrange appts, however he may have to wait as he will need to worked back into the schedule. Pt voiced understanding.

## 2021-04-27 ENCOUNTER — Telehealth: Payer: Self-pay

## 2021-04-27 MED ORDER — NONFORMULARY OR COMPOUNDED ITEM: 3 refills | Status: DC

## 2021-04-27 NOTE — Telephone Encounter (Signed)
Faxed rx thru [facility], spoke with patient and advised rx sent to [facility].

## 2021-04-27 NOTE — Addendum Note (Signed)
Addended by: Sueanne Margarita on: 04/27/2021 04:42 PM   Modules accepted: Orders

## 2021-04-27 NOTE — Telephone Encounter (Signed)
Okay to refill his Trmix prescription.

## 2021-04-27 NOTE — Telephone Encounter (Signed)
Incoming call (2x) from patient requesting a refill on his Trimix injections. Patient states he is going out of town on Friday and needs to have refilled prior to leaving. Advised patient on 2nd phone call this could take a day or two. Please advise.

## 2021-04-28 ENCOUNTER — Ambulatory Visit (INDEPENDENT_AMBULATORY_CARE_PROVIDER_SITE_OTHER): Payer: Medicare HMO

## 2021-04-28 ENCOUNTER — Other Ambulatory Visit: Payer: Self-pay

## 2021-04-28 DIAGNOSIS — E349 Endocrine disorder, unspecified: Secondary | ICD-10-CM

## 2021-04-28 DIAGNOSIS — E291 Testicular hypofunction: Secondary | ICD-10-CM

## 2021-04-28 DIAGNOSIS — N138 Other obstructive and reflux uropathy: Secondary | ICD-10-CM

## 2021-04-28 DIAGNOSIS — N401 Enlarged prostate with lower urinary tract symptoms: Secondary | ICD-10-CM

## 2021-04-28 MED ORDER — TESTOSTERONE CYPIONATE 200 MG/ML IM SOLN
100.0000 mg | Freq: Once | INTRAMUSCULAR | Status: AC
  Administered 2021-04-28: 100 mg via INTRAMUSCULAR

## 2021-04-28 NOTE — Progress Notes (Signed)
Testosterone IM Injection  Due to Hypogonadism patient is present today for a Testosterone Injection.  Medication: Testosterone Cypionate Dose: 0.5 Location: right upper outer buttocks Lot: [id number] Exp:09/2022  Patient tolerated well, no complications were noted  Preformed by: Gerarda Gunther, RMA   Follow up: 2 weeks

## 2021-04-29 LAB — HEMOGLOBIN AND HEMATOCRIT, BLOOD
Hematocrit: 44.1 % (ref 37.5–51.0)
Hemoglobin: 14.6 g/dL (ref 13.0–17.7)

## 2021-04-29 LAB — TESTOSTERONE: Testosterone: 420 ng/dL (ref 264–916)

## 2021-04-29 LAB — PSA: Prostate Specific Ag, Serum: 0.8 ng/mL (ref 0.0–4.0)

## 2021-04-29 NOTE — Progress Notes (Signed)
04/30/2021 9:09 AM   Walter Hodges 09-10-1955 [id number]  Referring provider: Charlott Rakes, MD [address] [place],  [place] [address]  Urological History: 1. Testosterone deficiency -testosterone 420 ng/dL in 04/2021 -managed with testosterone cypionate 200 mg/cc, 0.5 cc every 14 days -H & H WNL in 04/2021  2. ED -contributing factors of age, HTN, BPH, testosterone deficiency and obesity -failed PDE5i's -managed with Trimix (30/1/50) 1 cc   3. BPH with LU TS - PSA 0.8 ng/mL in 04/2021 - I PSS 7/3   Chief Complaint  Patient presents with   Testosterone deficiency     HPI: Walter Hodges is a 65 y.o. male who presents today for a three month follow up.  No urinary complaints.  Patient denies any modifying or aggravating factors.  Patient denies any gross hematuria, dysuria or suprapubic/flank pain.  Patient denies any fevers, chills, nausea or vomiting.      IPSS     Row Name 04/30/21 0800         International Prostate Symptom Score   How often have you had the sensation of not emptying your bladder? Not at All     How often have you had to urinate less than every two hours? More than half the time     How often have you found you stopped and started again several times when you urinated? Not at All     How often have you found it difficult to postpone urination? Not at All     How often have you had a weak urinary stream? Not at All     How often have you had to strain to start urination? Not at All     How many times did you typically get up at night to urinate? 3 Times     Total IPSS Score 7           Quality of Life due to urinary symptoms   If you were to spend the rest of your life with your urinary condition just the way it is now how would you feel about that? Mixed               Score:  1-7 Mild 8-19 Moderate 20-35 Severe  Patient still having spontaneous erections.   He denies any pain or curvature with erections.  He  injecting 1 cc of Trimix with success.   SHIM     Row Name 04/30/21 [phone]         SHIM: Over the last 6 months:   How do you rate your confidence that you could get and keep an erection? Low     When you had erections with sexual stimulation, how often were your erections hard enough for penetration (entering your partner)? Sometimes (about half the time)     During sexual intercourse, how often were you able to maintain your erection after you had penetrated (entered) your partner? Sometimes (about half the time)     During sexual intercourse, how difficult was it to maintain your erection to completion of intercourse? Very Difficult     When you attempted sexual intercourse, how often was it satisfactory for you? Sometimes (about half the time)           SHIM Total Score   SHIM 13               Score: 1-7 Severe ED 8-11 Moderate ED 12-16 Mild-Moderate ED 17-21 Mild ED 22-25 No ED  PMH: Past Medical History:  Diagnosis Date   Arthritis    Bell's palsy 2010   lasted one day ago, told that he had a ministroke   Dysrhythmia    atrial fib   Headache    Hollenhorst plaque, right eye    Hypertension    followed by Dr. Velta Addison" with [facility]      Surgical History: Past Surgical History:  Procedure Laterality Date   CARDIOVERSION N/A 05/29/2020   Procedure: CARDIOVERSION;  Surgeon: Yolonda Kida, MD;  Location: [facility];  Service: Cardiovascular;  Laterality: N/A;   CIRCUMCISION     done as a child- given inhalation    JOINT REPLACEMENT Bilateral    KNEE CLOSED REDUCTION Left 03/19/2016   Procedure: CLOSED MANIPULATION LEFT KNEE;  Surgeon: Leandrew Koyanagi, MD;  Location: [place];  Service: Orthopedics;  Laterality: Left;   LEFT HEART CATH AND CORONARY ANGIOGRAPHY N/A 11/06/2020   Procedure: LEFT HEART CATH AND CORONARY ANGIOGRAPHY;  Surgeon: Yolonda Kida, MD;  Location: [place] [facility];  Service: Cardiovascular;  Laterality: N/A;   TEE WITHOUT  CARDIOVERSION N/A 02/19/2016   Procedure: TRANSESOPHAGEAL ECHOCARDIOGRAM (TEE);  Surgeon: Sanda Klein, MD;  Location: [place];  Service: Cardiovascular;  Laterality: N/A;   TOTAL KNEE ARTHROPLASTY Right 05/26/2015   Procedure: RIGHT TOTAL KNEE ARTHROPLASTY;  Surgeon: Leandrew Koyanagi, MD;  Location: [place];  Service: Orthopedics;  Laterality: Right;   TOTAL KNEE ARTHROPLASTY Left 01/01/2016   Procedure: LEFT TOTAL KNEE ARTHROPLASTY;  Surgeon: Leandrew Koyanagi, MD;  Location: [place];  Service: Orthopedics;  Laterality: Left;    Home Medications:  Allergies as of 04/30/2021   No Known Allergies      Medication List        Accurate as of April 30, 2021  9:09 AM. If you have any questions, ask your nurse or doctor.          amiodarone 200 MG tablet Commonly known as: PACERONE Take 200 mg by mouth daily.   diclofenac Sodium 1 % Gel Commonly known as: VOLTAREN Apply 2 g topically 4 (four) times daily as needed (pain.).   Eliquis 5 MG Tabs tablet Generic drug: apixaban Take 5 mg by mouth 2 (two) times daily.   ibuprofen 400 MG tablet Commonly known as: ADVIL Take 400 mg by mouth every 6 (six) hours as needed (pain.).   losartan 100 MG tablet Commonly known as: COZAAR Take 100 mg by mouth daily.   Narcan 4 MG/0.1ML Liqd nasal spray kit Generic drug: naloxone Place 1 spray into the nose as needed (overdose).   nitroGLYCERIN 0.4 MG SL tablet Commonly known as: NITROSTAT Place 0.4 mg under the tongue every 5 (five) minutes x 3 doses as needed for chest pain.   NONFORMULARY OR COMPOUNDED ITEM Trimix (30/1/50)-(Pap/Phent/PGE) Dosage: Inject 0.8-1 cc per injection 10 1cc vials Qty1 Refills 3 [place] [phone] Fax [phone]   oxyCODONE-acetaminophen 10-325 MG tablet Commonly known as: PERCOCET Take 1 tablet by mouth every 4 (four) hours as needed for pain.   Testosterone Cypionate 200 MG/ML Soln Inject 0.5 mLs as directed. 0.5 ml every two weeks    Viagra 100 MG tablet Generic drug: sildenafil Take by mouth.        Allergies: No Known Allergies  Family History: Family History  Problem Relation Age of Onset   Headache Neg Hx     Social History:  reports that he has never smoked. He has never used smokeless tobacco. He  reports that he does not drink alcohol and does not use drugs.  ROS: Pertinent ROS in HPI  Physical Exam: BP (!) 155/83   Pulse 76   Ht 5' 10"  (1.778 m)   Wt 243 lb (110.2 kg)   BMI 34.87 kg/m   Constitutional:  Well nourished. Alert and oriented, No acute distress. HEENT: [place] AT, mask in place.  Trachea midline Cardiovascular: No clubbing, cyanosis, or edema. Respiratory: Normal respiratory effort, no increased work of breathing. GU: No CVA tenderness.  No bladder fullness or masses.  Patient with circumcised phallus.  Urethral meatus is patent.  No penile discharge. No penile lesions or rashes. Scrotum without lesions, cysts, rashes and/or edema.  Testicles are located scrotally bilaterally. No masses are appreciated in the testicles. Left and right epididymis are normal. Rectal: Patient with  normal sphincter tone. Anus and perineum without scarring or rashes. No rectal masses are appreciated. Prostate is approximately 60 + grams, could only palpate the apex and the midportion of the gland, no nodules are appreciated. Seminal vesicles could not be palpated Neurologic: Grossly intact, no focal deficits, moving all 4 extremities. Psychiatric: Normal mood and affect.   Laboratory Data: Component     Latest Ref Rng & Units 12/26/2017 09/27/2018 10/04/2018 03/27/2019  Testosterone     264 - 916 ng/dL 451 1,034 (H) 464 668   Component     Latest Ref Rng & Units 09/26/2019 03/25/2020 04/10/2020 07/14/2020  Testosterone     264 - 916 ng/dL 355 1,269 (H) 810 369   Component     Latest Ref Rng & Units 10/24/2020 01/27/2021 04/28/2021  Testosterone     264 - 916 ng/dL 693 1,024 (H) 420   Component     Latest Ref  Rng & Units 12/26/2017 09/27/2018 04/03/2019 09/26/2019  Prostate Specific Ag, Serum     0.0 - 4.0 ng/mL 3.1 1.0 1.0 0.8   Component     Latest Ref Rng & Units 03/25/2020 07/14/2020 10/24/2020 01/27/2021  Prostate Specific Ag, Serum     0.0 - 4.0 ng/mL 1.2 0.7 0.9 1.6   Component     Latest Ref Rng & Units 04/28/2021  Prostate Specific Ag, Serum     0.0 - 4.0 ng/mL 0.8   Component     Latest Ref Rng & Units 09/27/2018 04/03/2019 09/26/2019 12/10/2019  Hemoglobin     13.0 - 17.7 g/dL 14.0 15.0 15.4 15.4  HCT     37.5 - 51.0 % 40.9 44.2 45.9 46.9   Component     Latest Ref Rng & Units 03/25/2020 07/14/2020 10/24/2020 01/27/2021  Hemoglobin     13.0 - 17.7 g/dL 15.8 14.8 15.1 15.2  HCT     37.5 - 51.0 % 47.7 44.3 44.5 46.5   Component     Latest Ref Rng & Units 04/28/2021  Hemoglobin     13.0 - 17.7 g/dL 14.6  HCT     37.5 - 51.0 % 44.1  I have reviewed the labs.   Pertinent Imaging: N/A   Assessment & Plan:    1. Testosterone deficiency  -Continue testosterone cypionate 0.5 cc every 14 days  2. BPH with LUTS -PSA stable -DRE benign  3. Erectile dysfunction:    -continue Trimix (30/1/50)-refills given   Return in about 3 months (around 07/31/2021) for testosterone (one week after injection) H & H-only .  These notes generated with voice recognition software. I apologize for typographical errors.  Zara Council, [facility]  [address] [place], [place] [address] [phone]

## 2021-04-30 ENCOUNTER — Encounter: Payer: Self-pay | Admitting: Urology

## 2021-04-30 ENCOUNTER — Other Ambulatory Visit: Payer: Self-pay

## 2021-04-30 ENCOUNTER — Ambulatory Visit (INDEPENDENT_AMBULATORY_CARE_PROVIDER_SITE_OTHER): Payer: Medicare HMO | Admitting: Urology

## 2021-04-30 VITALS — BP 155/83 | HR 76 | Ht 70.0 in | Wt 243.0 lb

## 2021-04-30 DIAGNOSIS — E349 Endocrine disorder, unspecified: Secondary | ICD-10-CM | POA: Diagnosis not present

## 2021-04-30 DIAGNOSIS — N5201 Erectile dysfunction due to arterial insufficiency: Secondary | ICD-10-CM

## 2021-04-30 DIAGNOSIS — N138 Other obstructive and reflux uropathy: Secondary | ICD-10-CM | POA: Diagnosis not present

## 2021-04-30 DIAGNOSIS — N401 Enlarged prostate with lower urinary tract symptoms: Secondary | ICD-10-CM

## 2021-05-01 ENCOUNTER — Ambulatory Visit: Payer: Self-pay | Admitting: Urology

## 2021-05-06 ENCOUNTER — Ambulatory Visit: Payer: Self-pay | Admitting: Urology

## 2021-05-12 ENCOUNTER — Ambulatory Visit: Payer: Medicare HMO | Admitting: Physician Assistant

## 2021-06-30 ENCOUNTER — Ambulatory Visit: Payer: Medicare HMO

## 2021-07-07 ENCOUNTER — Ambulatory Visit (INDEPENDENT_AMBULATORY_CARE_PROVIDER_SITE_OTHER): Payer: Medicare HMO | Admitting: *Deleted

## 2021-07-07 ENCOUNTER — Other Ambulatory Visit: Payer: Self-pay

## 2021-07-07 DIAGNOSIS — E349 Endocrine disorder, unspecified: Secondary | ICD-10-CM | POA: Diagnosis not present

## 2021-07-07 MED ORDER — TESTOSTERONE CYPIONATE 200 MG/ML IM SOLN
200.0000 mg | Freq: Once | INTRAMUSCULAR | Status: AC
  Administered 2021-07-07: 200 mg via INTRAMUSCULAR

## 2021-07-07 NOTE — Progress Notes (Signed)
Testosterone IM Injection   Due to Hypogonadism patient is present today for a Testosterone Injection.   Medication: Testosterone Cypionate Dose: 0.5 Location: right upper outer buttocks Lot: [id number] Exp:09/2022   Patient tolerated well, no complications were noted   Preformed by: Ples Specter CMa         Follow up: 2 weeks

## 2021-07-21 ENCOUNTER — Ambulatory Visit: Payer: Medicare HMO

## 2021-07-21 ENCOUNTER — Encounter: Payer: Self-pay | Admitting: Urology

## 2021-07-29 ENCOUNTER — Emergency Department
Admission: EM | Admit: 2021-07-29 | Discharge: 2021-07-29 | Discharge status: Against Medical Advice | Payer: Medicare HMO | Attending: Emergency Medicine | Admitting: Emergency Medicine

## 2021-07-29 DIAGNOSIS — E876 Hypokalemia: Secondary | ICD-10-CM | POA: Insufficient documentation

## 2021-07-29 DIAGNOSIS — Z96653 Presence of artificial knee joint, bilateral: Secondary | ICD-10-CM | POA: Diagnosis not present

## 2021-07-29 DIAGNOSIS — T783XXA Angioneurotic edema, initial encounter: Secondary | ICD-10-CM | POA: Diagnosis not present

## 2021-07-29 DIAGNOSIS — T782XXA Anaphylactic shock, unspecified, initial encounter: Secondary | ICD-10-CM | POA: Diagnosis not present

## 2021-07-29 DIAGNOSIS — Z7901 Long term (current) use of anticoagulants: Secondary | ICD-10-CM | POA: Diagnosis not present

## 2021-07-29 DIAGNOSIS — I10 Essential (primary) hypertension: Secondary | ICD-10-CM | POA: Insufficient documentation

## 2021-07-29 LAB — CBC
HCT: 43.6 % (ref 39.0–52.0)
Hemoglobin: 14.9 g/dL (ref 13.0–17.0)
MCH: 29.2 pg (ref 26.0–34.0)
MCHC: 34.2 g/dL (ref 30.0–36.0)
MCV: 85.3 fL (ref 80.0–100.0)
Platelets: 267 10*3/uL (ref 150–400)
RBC: 5.11 MIL/uL (ref 4.22–5.81)
RDW: 13.3 % (ref 11.5–15.5)
WBC: 5.2 10*3/uL (ref 4.0–10.5)
nRBC: 0 % (ref 0.0–0.2)

## 2021-07-29 LAB — BASIC METABOLIC PANEL
Anion gap: 6 (ref 5–15)
BUN: 12 mg/dL (ref 8–23)
CO2: 27 mmol/L (ref 22–32)
Calcium: 8.7 mg/dL — ABNORMAL LOW (ref 8.9–10.3)
Chloride: 103 mmol/L (ref 98–111)
Creatinine, Ser: 0.67 mg/dL (ref 0.61–1.24)
GFR, Estimated: >60 mL/min (ref >60)
Glucose, Bld: 104 mg/dL — ABNORMAL HIGH (ref 70–99)
Potassium: 2.9 mmol/L — ABNORMAL LOW (ref 3.5–5.1)
Sodium: 136 mmol/L (ref 135–145)

## 2021-07-29 LAB — MAGNESIUM: Magnesium: 2 mg/dL (ref 1.7–2.4)

## 2021-07-29 MED ORDER — EPINEPHRINE 0.3 MG/0.3ML IJ SOAJ: 0.3000 mg | INTRAMUSCULAR | 0 refills | Status: AC | PRN

## 2021-07-29 MED ORDER — DIPHENHYDRAMINE HCL 50 MG/ML IJ SOLN
50.0000 mg | Freq: Once | INTRAMUSCULAR | Status: AC
  Administered 2021-07-29: 50 mg via INTRAVENOUS
  Filled 2021-07-29: qty 1

## 2021-07-29 MED ORDER — FAMOTIDINE IN NACL 20-0.9 MG/50ML-% IV SOLN
20.0000 mg | Freq: Once | INTRAVENOUS | Status: AC
  Administered 2021-07-29: 20 mg via INTRAVENOUS
  Filled 2021-07-29: qty 50

## 2021-07-29 MED ORDER — EPINEPHRINE 0.3 MG/0.3ML IJ SOAJ
0.3000 mg | Freq: Once | INTRAMUSCULAR | Status: AC
  Administered 2021-07-29: 0.3 mg via INTRAMUSCULAR
  Filled 2021-07-29: qty 0.3

## 2021-07-29 MED ORDER — METHYLPREDNISOLONE SODIUM SUCC 125 MG IJ SOLR
125.0000 mg | Freq: Once | INTRAMUSCULAR | Status: AC
  Administered 2021-07-29: 125 mg via INTRAVENOUS
  Filled 2021-07-29: qty 2

## 2021-07-29 NOTE — ED Provider Notes (Addendum)
[facility] Emergency Department Provider Note  ____________________________________________   Event Date/Time   First MD Initiated Contact with Patient 07/29/21 1212     (approximate)  I have reviewed the triage vital signs and the nursing notes.   HISTORY  Chief Complaint Allergic Reaction   HPI Walter Hodges is a 65 y.o. male with past medical history of arthritis, Bell palsy, A. fib, HTN on losartan and DJD who presents for assessment of some swelling of his tongue and lips as well as throat itchiness that started this morning around 10 AM after taking his dose of losartan.  No associated chest pain, shortness of breath, fevers, vomiting, diarrhea, abdominal pain, burning with urination, extremity pain, recent falls or injuries or any other new medications.  No prior similar episodes.  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  No prior similar episodes.  No headache, earache, sore throat, fevers, nausea, vomiting, diarrhea, burning with urination, abdominal pain, back pain or recent injuries or falls.  Patient denies any illicit drug use, tobacco abuse or EtOH use.  No other acute concerns at this time.         Past Medical History:  Diagnosis Date   Arthritis    Bell's palsy 2010   lasted one day ago, told that he had a ministroke   Dysrhythmia    atrial fib   Headache    Hollenhorst plaque, right eye    Hypertension    followed by Dr. Velta Addison" with [facility]      Patient Active Problem List   Diagnosis Date Noted   Degenerative joint disease of shoulder region 02/10/2021   Carpal tunnel syndrome of left wrist 02/10/2021   Fibrillation, atrial ([place]) 01/10/2020   Obesity, unspecified 08/18/2017   Shoulder joint pain 07/05/2016   Small Ostium secundum type atrial septal defect with septal aneurysm 03/22/2016   Pain in knee 03/19/2016   Embolism involving  retinal artery    Insomnia 02/16/2016   Hollenhorst plaque, right eye 02/16/2016   Broken tooth, complicated 38/88/2800   Hypertension 01/07/2016   Total knee replacement status 01/01/2016   Primary osteoarthritis of right knee 05/26/2015   Benign essential hypertension 04/17/2009   Male erectile disorder (CODE) 12/16/2008    Past Surgical History:  Procedure Laterality Date   CARDIOVERSION N/A 05/29/2020   Procedure: CARDIOVERSION;  Surgeon: Yolonda Kida, MD;  Location: [facility];  Service: Cardiovascular;  Laterality: N/A;   CIRCUMCISION     done as a child- given inhalation    JOINT REPLACEMENT Bilateral    KNEE CLOSED REDUCTION Left 03/19/2016   Procedure: CLOSED MANIPULATION LEFT KNEE;  Surgeon: Leandrew Koyanagi, MD;  Location: [place];  Service: Orthopedics;  Laterality: Left;   LEFT HEART CATH AND CORONARY ANGIOGRAPHY N/A 11/06/2020   Procedure: LEFT HEART CATH AND CORONARY ANGIOGRAPHY;  Surgeon: Yolonda Kida, MD;  Location: [place] [facility];  Service: Cardiovascular;  Laterality: N/A;   TEE WITHOUT CARDIOVERSION N/A 02/19/2016   Procedure: TRANSESOPHAGEAL ECHOCARDIOGRAM (TEE);  Surgeon: Sanda Klein, MD;  Location: [place];  Service: Cardiovascular;  Laterality: N/A;   TOTAL KNEE ARTHROPLASTY Right 05/26/2015   Procedure: RIGHT TOTAL KNEE ARTHROPLASTY;  Surgeon: Leandrew Koyanagi, MD;  Location: [place];  Service: Orthopedics;  Laterality: Right;   TOTAL KNEE ARTHROPLASTY Left 01/01/2016   Procedure: LEFT TOTAL KNEE ARTHROPLASTY;  Surgeon: Marylynn Pearson  Erlinda Hong, MD;  Location: [place];  Service: Orthopedics;  Laterality: Left;    Prior to Admission medications   Medication Sig Start Date End Date Taking? Authorizing Provider  EPINEPHrine 0.3 mg/0.3 mL IJ SOAJ injection Inject 0.3 mg into the muscle as needed for anaphylaxis. 07/29/21  Yes Lucrezia Starch, MD  amiodarone (PACERONE) 200 MG tablet Take 200 mg by mouth daily. 04/16/20   [provider]  diclofenac Sodium  (VOLTAREN) 1 % GEL Apply 2 g topically 4 (four) times daily as needed (pain.).    [provider]  ELIQUIS 5 MG TABS tablet Take 5 mg by mouth 2 (two) times daily. 01/22/20   [provider]  ibuprofen (ADVIL) 400 MG tablet Take 400 mg by mouth every 6 (six) hours as needed (pain.). 09/09/20   [provider]  NARCAN 4 MG/0.1ML LIQD nasal spray kit Place 1 spray into the nose as needed (overdose). 06/30/20   [provider]  nitroGLYCERIN (NITROSTAT) 0.4 MG SL tablet Place 0.4 mg under the tongue every 5 (five) minutes x 3 doses as needed for chest pain. 10/30/20   [provider]  NONFORMULARY OR COMPOUNDED ITEM Trimix (30/1/50)-(Pap/Phent/PGE) Dosage: Inject 0.8-1 cc per injection 10 1cc vials Qty1 Refills 3 [place] [phone] Fax [phone] 04/27/21   Nori Riis, PA-C  Testosterone Cypionate 200 MG/ML SOLN Inject 0.5 mLs as directed. 0.5 ml every two weeks    [provider]  VIAGRA 100 MG tablet Take by mouth. 03/30/21   [provider]    Allergies Ace inhibitors  Family History  Problem Relation Age of Onset   Headache Neg Hx     Social History Social History   Tobacco Use   Smoking status: Never   Smokeless tobacco: Never  Vaping Use   Vaping Use: Never used  Substance Use Topics   Alcohol use: No    Comment: Socially in the past   Drug use: No    Review of Systems  Review of Systems  Constitutional:  Negative for chills and fever.  HENT:  Negative for sore throat.   Eyes:  Negative for pain.  Respiratory:  Positive for shortness of breath (mild earlier but improved after benadryl given in triage). Negative for cough and stridor.   Cardiovascular:  Negative for chest pain.  Gastrointestinal:  Negative for vomiting.  Skin:  Positive for itching (throat). Negative for rash.  Neurological:  Negative for seizures, loss of consciousness and headaches.  Psychiatric/Behavioral:  Negative  for suicidal ideas.   All other systems reviewed and are negative.    ____________________________________________   PHYSICAL EXAM:  VITAL SIGNS: ED Triage Vitals  Enc Vitals Group     BP 07/29/21 1136 (!) 162/108     Pulse Rate 07/29/21 1136 82     Resp 07/29/21 1136 20     Temp 07/29/21 1136 98.7 F (37.1 C)     Temp Source 07/29/21 1136 Oral     SpO2 07/29/21 1136 98 %     Weight 07/29/21 1138 245 lb (111.1 kg)     Height 07/29/21 1138 5' 10"  (1.778 m)     Head Circumference --      Peak Flow --      Pain Score --      Pain Loc --      Pain Edu? --      Excl. in [place]? --    Vitals:   07/29/21 1136  BP: (!) 162/108  Pulse: 82  Resp: 20  Temp: 98.7 F (37.1 C)  SpO2: 98%   Physical Exam Vitals and nursing note reviewed.  Constitutional:      General: He is not in acute distress.    Appearance: He is well-developed.  HENT:     Head: Normocephalic and atraumatic.     Right Ear: External ear normal.     Left Ear: External ear normal.     Nose: Nose normal.  Eyes:     Conjunctiva/sclera: Conjunctivae normal.  Cardiovascular:     Rate and Rhythm: Normal rate and regular rhythm.     Heart sounds: No murmur heard. Pulmonary:     Effort: Pulmonary effort is normal. No respiratory distress.     Breath sounds: Normal breath sounds.  Abdominal:     Palpations: Abdomen is soft.     Tenderness: There is no abdominal tenderness.  Musculoskeletal:        General: No swelling.     Cervical back: Neck supple.     Right lower leg: Edema present.     Left lower leg: Edema present.  Skin:    General: Skin is warm and dry.     Capillary Refill: Capillary refill takes less than 2 seconds.  Neurological:     Mental Status: He is alert.  Psychiatric:        Mood and Affect: Mood normal.    Lungs are clear bilaterally.  Abdomen is soft nontender throughout.  There is no stridor over the neck.  There is some edema of the upper and lower lips as well as the tongue.   Unable to visualize the posterior pharynx.  Patient has a normal voice without significant hoarseness.  Cranial nerves II through XII are grossly intact.  No significant overlying skin changes. ____________________________________________   LABS (all labs ordered are listed, but only abnormal results are displayed)  Labs Reviewed  BASIC METABOLIC PANEL - Abnormal; Notable for the following components:      Result Value   Potassium 2.9 (*)    Glucose, Bld 104 (*)    Calcium 8.7 (*)    All other components within normal limits  CBC  MAGNESIUM   ____________________________________________  EKG  Past medical history of A. fib rate controlled with a ventricular rate of 80, unremarkable intervals, normal axis with nonspecific changes in lateral leads.  No other clear evidence of acute ischemia or significant arrhythmia.   ____________________________________________  RADIOLOGY  ED MD interpretation:    Official radiology report(s): No results found.  ____________________________________________   PROCEDURES  Procedure(s) performed (including Critical Care):  .Critical Care Performed by: Lucrezia Starch, MD Authorized by: Lucrezia Starch, MD   Critical care provider statement:    Critical care time (minutes):  30   Critical care was necessary to treat or prevent imminent or life-threatening deterioration of the following conditions: anaphylaxis requiring epi.   Critical care was time spent personally by me on the following activities:  Development of treatment plan with patient or surrogate, discussions with consultants, evaluation of patient's response to treatment, examination of patient, ordering and review of laboratory studies, ordering and review of radiographic studies, ordering and performing treatments and interventions, pulse oximetry, re-evaluation of patient's condition and review of old charts   ____________________________________________   [place] / [place] / ED COURSE       Patient presents with above-stated history exam for assessment of sudden onset of lip swelling and tongue swelling so  scratchy throat started fairly suddenly today.  Patient is on losartan he did take it today but also notes he got his beard trimmed at the barber and is wondering if this could have triggered it.  No new medications chemicals or other exposures he is aware of.  On arrival he is hypertensive with otherwise stable vital signs on room air.  He does have an angioedema of his tongue and lips but has no evidence of respiratory distress, stridor, increased work of breathing and is managing his secretions without difficulty.  Suspect possibly ACE related angioedema although somewhat difficult to exclude anaphylaxis given creatinine is in the throat and significantly breathing earlier.  Will give epinephrine and patient is already received Pepcid and Benadryl in triage.  We will plan to observe for 3 hours from time of epi administration.  There is no findings on history exam to suggest acute infectious process.  CBC obtained in triage is unremarkable for leukocytosis or acute anemia.  BMP remarkable for K of 2.9 without other significant derangements.  Magnesium is within normal limits.  Patient denying any shortness of breath on my reassessment and to several times on the emergency room stating this was only before he came back from triage.  Patient stating he wished to leave against advice after 2 hours of observation.  Discussed at length recommendation to stay recommended 3 hours given he still has some persistent edema although slightly improved.  He states he understands he could get worse and could die without immediate intervention if he goes home and deteriorates.  I think he has capacity to make this decision and leave prior to completing recommend observation.Marland Kitchen  Discharged stable condition.  Emphasized importance of discontinuing losartan  and follow-up with PCP to have his blood pressure rechecked and identified alternative antihypertensive.        ____________________________________________   FINAL CLINICAL IMPRESSION(S) / ED DIAGNOSES  Final diagnoses:  Angioedema, initial encounter  Hypokalemia  Anaphylaxis, initial encounter    Medications  famotidine (PEPCID) IVPB 20 mg premix (0 mg Intravenous Stopped 07/29/21 1311)  diphenhydrAMINE (BENADRYL) injection 50 mg (50 mg Intravenous Given 07/29/21 1204)  methylPREDNISolone sodium succinate (SOLU-MEDROL) 125 mg/2 mL injection 125 mg (125 mg Intravenous Given 07/29/21 1205)  EPINEPHrine (EPI-PEN) injection 0.3 mg (0.3 mg Intramuscular Given 07/29/21 1311)     ED Discharge Orders          Ordered    EPINEPHrine 0.3 mg/0.3 mL IJ SOAJ injection  As needed        07/29/21 1343             Note:  This document was prepared using Dragon voice recognition software and may include unintentional dictation errors.    Lucrezia Starch, MD 07/29/21 1421    Lucrezia Starch, MD 07/29/21 [phone]

## 2021-07-29 NOTE — ED Notes (Signed)
Pt unable to sign discharge paperwork due to pt being agitated. Pt was educated discharge instructions. Pt verbalized understanding.

## 2021-07-29 NOTE — ED Triage Notes (Signed)
Pt to ED via POV from home. Pt presents with angioedema and currently on losartan. Pt endorses throat/tongue swelling and pruritis. Pt stating symptoms  started at 10am after taking dose of losartan. Pt denies any recent changes to medication. Pt denies CP or SOB.

## 2021-07-29 NOTE — ED Provider Notes (Signed)
Emergency Medicine Provider Triage Evaluation Note  Walter Hodges, a 65 y.o. male  was evaluated in triage.  Pt complains of lip and tongue swelling as well as generalized itching.  Patient reports onset of symptoms at 10 AM after he took his morning dose of losartan.  He was previously on lisinopril but denies any previous similar reactions.  Denies any recent changes to medication.  Denies any chest pain, shortness of breath, difficulty controlling oral secretions, or pain with swallowing.  Review of Systems  Positive: Angioedema, urticaria Negative: CP, SOB  Physical Exam  BP (!) 162/108 (BP Location: Right Arm)   Pulse 82   Temp 98.7 F (37.1 C) (Oral)   Resp 20   Ht 5\' 10"  (1.778 m)   Wt 111.1 kg   SpO2 98%   BMI 35.15 kg/m  Gen:   Awake, no distress scratching noted Resp:  Normal effort CTA SKIN:   Generalized urticaria noted Other:  ENT: erythema and edema noted to the lips, tongue  Medical Decision Making  Medically screening exam initiated at 11:54 AM.  Appropriate orders placed.  was informed that the remainder of the evaluation will be completed by another provider, this initial triage assessment does not replace that evaluation, and the importance of remaining in the ED until their evaluation is complete.  ED evaluation of sudden onset of angioedema and urticaria.  Patient noted onset after taking his morning dose of losartan.   [name], PA-C 07/29/21 1156    07/31/21, MD 07/29/21 [phone]

## 2021-08-03 ENCOUNTER — Other Ambulatory Visit: Payer: Self-pay

## 2021-08-03 DIAGNOSIS — E349 Endocrine disorder, unspecified: Secondary | ICD-10-CM

## 2021-08-03 DIAGNOSIS — E291 Testicular hypofunction: Secondary | ICD-10-CM

## 2021-08-04 ENCOUNTER — Other Ambulatory Visit: Payer: Self-pay

## 2021-08-04 ENCOUNTER — Other Ambulatory Visit: Payer: Medicare HMO

## 2021-08-04 DIAGNOSIS — E291 Testicular hypofunction: Secondary | ICD-10-CM

## 2021-08-04 DIAGNOSIS — E349 Endocrine disorder, unspecified: Secondary | ICD-10-CM

## 2021-08-05 LAB — TESTOSTERONE: Testosterone: 461 ng/dL (ref 264–916)

## 2021-08-05 LAB — HEMATOCRIT: Hematocrit: 44.9 % (ref 37.5–51.0)

## 2021-08-24 ENCOUNTER — Ambulatory Visit (INDEPENDENT_AMBULATORY_CARE_PROVIDER_SITE_OTHER): Payer: Medicare HMO | Admitting: Family Medicine

## 2021-08-24 ENCOUNTER — Other Ambulatory Visit: Payer: Self-pay

## 2021-08-24 DIAGNOSIS — E349 Endocrine disorder, unspecified: Secondary | ICD-10-CM | POA: Diagnosis not present

## 2021-08-24 MED ORDER — TESTOSTERONE CYPIONATE 200 MG/ML IM SOLN
200.0000 mg | Freq: Once | INTRAMUSCULAR | Status: AC
Start: 2021-08-24 — End: 2021-08-24
  Administered 2021-08-24: 200 mg via INTRAMUSCULAR

## 2021-08-24 NOTE — Progress Notes (Signed)
Testosterone IM Injection  Due to Hypogonadism patient is present today for a Testosterone Injection.  Medication: Testosterone Cypionate Dose: 0.77ml Location: right upper outer buttocks Lot: [id number] Exp:09/2022  Patient tolerated well, no complications were noted  Performed by: Teressa Lower, CMA  Follow up: 2 weeks

## 2021-08-26 ENCOUNTER — Other Ambulatory Visit: Payer: Self-pay | Admitting: Urology

## 2021-08-26 MED ORDER — NONFORMULARY OR COMPOUNDED ITEM: 3 refills | Status: DC

## 2021-08-26 NOTE — Telephone Encounter (Signed)
Ok to fill 

## 2021-08-26 NOTE — Telephone Encounter (Signed)
rx faxed to pharmacy thru [facility]  

## 2021-08-26 NOTE — Telephone Encounter (Signed)
Patient is requesting a refill of Trimix to be sent to [facility] in [place]

## 2021-09-08 ENCOUNTER — Ambulatory Visit (INDEPENDENT_AMBULATORY_CARE_PROVIDER_SITE_OTHER): Payer: Medicaid Other | Admitting: Family Medicine

## 2021-09-08 ENCOUNTER — Other Ambulatory Visit: Payer: Self-pay

## 2021-09-08 DIAGNOSIS — E349 Endocrine disorder, unspecified: Secondary | ICD-10-CM

## 2021-09-08 DIAGNOSIS — E291 Testicular hypofunction: Secondary | ICD-10-CM | POA: Diagnosis not present

## 2021-09-08 MED ORDER — TESTOSTERONE CYPIONATE 200 MG/ML IM SOLN
200.0000 mg | Freq: Once | INTRAMUSCULAR | Status: AC
  Administered 2021-09-08: 200 mg via INTRAMUSCULAR

## 2021-09-08 NOTE — Progress Notes (Signed)
Testosterone IM Injection  Due to Hypogonadism patient is present today for a Testosterone Injection.  Medication: Testosterone Cypionate Dose: 0.54ml Location: right upper outer buttocks Lot: [id number] Exp:09/2022  Patient tolerated well, no complications were noted  Performed by: Elberta Leatherwood, CMA  Follow up: 2 weeks

## 2021-09-21 ENCOUNTER — Ambulatory Visit: Payer: Medicaid Other

## 2021-09-21 ENCOUNTER — Encounter: Payer: Self-pay | Admitting: Urology

## 2021-09-24 ENCOUNTER — Other Ambulatory Visit: Payer: Self-pay | Admitting: Urology

## 2021-09-28 ENCOUNTER — Other Ambulatory Visit: Payer: Self-pay | Admitting: Urology

## 2021-09-29 ENCOUNTER — Ambulatory Visit: Payer: Medicaid Other

## 2021-10-13 ENCOUNTER — Ambulatory Visit (INDEPENDENT_AMBULATORY_CARE_PROVIDER_SITE_OTHER): Payer: Medicare Other | Admitting: Family Medicine

## 2021-10-13 ENCOUNTER — Other Ambulatory Visit: Payer: Self-pay

## 2021-10-13 DIAGNOSIS — E341 Other hypersecretion of intestinal hormones: Secondary | ICD-10-CM

## 2021-10-13 DIAGNOSIS — E349 Endocrine disorder, unspecified: Secondary | ICD-10-CM

## 2021-10-13 MED ORDER — TESTOSTERONE CYPIONATE 200 MG/ML IM SOLN
200.0000 mg | Freq: Once | INTRAMUSCULAR | Status: AC
  Administered 2021-10-13: 200 mg via INTRAMUSCULAR

## 2021-10-13 NOTE — Progress Notes (Signed)
Testosterone IM Injection ° °Due to Hypogonadism patient is present today for a Testosterone Injection. ° °Medication: Testosterone Cypionate °Dose: 0.5ml °Location: right upper outer buttocks °Lot: [id number] °Exp:09/2022 ° °Patient tolerated well, no complications were noted ° °Performed by: Tydarius Yawn, CMA ° °Follow up: 2 weeks  °

## 2021-10-27 ENCOUNTER — Ambulatory Visit (INDEPENDENT_AMBULATORY_CARE_PROVIDER_SITE_OTHER): Payer: Medicare Other | Admitting: *Deleted

## 2021-10-27 ENCOUNTER — Other Ambulatory Visit: Payer: Self-pay

## 2021-10-27 DIAGNOSIS — E291 Testicular hypofunction: Secondary | ICD-10-CM | POA: Diagnosis not present

## 2021-10-27 DIAGNOSIS — E349 Endocrine disorder, unspecified: Secondary | ICD-10-CM

## 2021-10-27 MED ORDER — TESTOSTERONE CYPIONATE 200 MG/ML IM SOLN
200.0000 mg | Freq: Once | INTRAMUSCULAR | Status: AC
  Administered 2021-10-27: 200 mg via INTRAMUSCULAR

## 2021-10-27 NOTE — Progress Notes (Signed)
Testosterone IM Injection   Due to Hypogonadism patient is present today for a Testosterone Injection.   Medication: Testosterone Cypionate Dose: 0.27ml Location: right upper outer buttocks Lot: [id number] Exp:008/2024   Patient tolerated well, no complications were noted   Performed by: Ples Specter CMA   Follow up: 2 weeks

## 2021-10-29 ENCOUNTER — Other Ambulatory Visit: Payer: Medicare HMO

## 2021-10-30 ENCOUNTER — Ambulatory Visit: Payer: Medicare HMO | Admitting: Urology

## 2021-10-30 ENCOUNTER — Other Ambulatory Visit: Payer: Self-pay | Admitting: Family Medicine

## 2021-10-30 DIAGNOSIS — E349 Endocrine disorder, unspecified: Secondary | ICD-10-CM

## 2021-10-30 DIAGNOSIS — N138 Other obstructive and reflux uropathy: Secondary | ICD-10-CM

## 2021-11-02 ENCOUNTER — Other Ambulatory Visit: Payer: Self-pay

## 2021-11-02 ENCOUNTER — Other Ambulatory Visit: Payer: Medicare HMO

## 2021-11-02 DIAGNOSIS — E349 Endocrine disorder, unspecified: Secondary | ICD-10-CM

## 2021-11-02 DIAGNOSIS — N401 Enlarged prostate with lower urinary tract symptoms: Secondary | ICD-10-CM

## 2021-11-02 DIAGNOSIS — N138 Other obstructive and reflux uropathy: Secondary | ICD-10-CM

## 2021-11-03 LAB — PSA: Prostate Specific Ag, Serum: 1.2 ng/mL (ref 0.0–4.0)

## 2021-11-03 LAB — HEMOGLOBIN: Hemoglobin: 14.1 g/dL (ref 13.0–17.7)

## 2021-11-03 LAB — HEMATOCRIT: Hematocrit: 41.9 % (ref 37.5–51.0)

## 2021-11-03 LAB — TESTOSTERONE: Testosterone: 1068 ng/dL — ABNORMAL HIGH (ref 264–916)

## 2021-11-10 ENCOUNTER — Ambulatory Visit: Payer: Medicare Other

## 2021-11-11 NOTE — Progress Notes (Deleted)
? ? ?11/12/2021 ?1:15 PM  ? ?Walter Hodges ?[birth year] ?[id number] ? ?Referring provider: Emogene Morgan, MD ?[address] ?[place],  [place] [address] ? ?No chief complaint on file. ?  ?Urological History: ?1. Testosterone deficiency ?-testosterone 1068 ng/dL 19/1478 ?-H & H WNL  29/5621 ?-managed with testosterone cypionate 200 mg/cc, 0.5 cc every 14 days ? ?2. ED ?-contributing factors of age, HTN, BPH, testosterone deficiency and obesity ?-failed PDE5i's ?-managed with Trimix (30/1/50) 1 cc  ? ?3. BPH with LU TS ?- PSA 1.2 ng/mL 10/2021 ?- I PSS *** ? ? ?No chief complaint on file. ?  ? ?HPI: ?Walter Hodges is a 66 y.o. male who presents today for a six month follow up. ? ?No urinary complaints.  Patient denies any modifying or aggravating factors.  Patient denies any gross hematuria, dysuria or suprapubic/flank pain.  Patient denies any fevers, chills, nausea or vomiting.   ? ? ? ? ? ? ?Score:  ?1-7 Mild ?8-19 Moderate ?20-35 Severe ? ?Patient still having spontaneous erections.   He denies any pain or curvature with erections.  He injecting 1 cc of Trimix with success. ? ? ? ? ? ?Score: ?1-7 Severe ED ?8-11 Moderate ED ?12-16 Mild-Moderate ED ?17-21 Mild ED ?22-25 No ED ? ? ?PMH: ?Past Medical History:  ?Diagnosis Date  ? Arthritis   ? Bell's palsy 2010  ? lasted one day ago, told that he had a ministroke  ? Dysrhythmia   ? atrial fib  ? Headache   ? Hollenhorst plaque, right eye   ? Hypertension   ? followed by Dr. Nita Sells" with [facility]    ? ? ?Surgical History: ?Past Surgical History:  ?Procedure Laterality Date  ? CARDIOVERSION N/A 05/29/2020  ? Procedure: CARDIOVERSION;  Surgeon: Alwyn Pea, MD;  Location: [facility] ORS;  Service: Cardiovascular;  Laterality: N/A;  ? CIRCUMCISION    ? done as a child- given inhalation   ? JOINT REPLACEMENT Bilateral   ? KNEE CLOSED REDUCTION Left 03/19/2016  ? Procedure: CLOSED MANIPULATION LEFT KNEE;  Surgeon: Tarry Kos, MD;  [facility]: [facility];  Service:  Orthopedics;  Laterality: Left;  ? LEFT HEART CATH AND CORONARY ANGIOGRAPHY N/A 11/06/2020  ? Procedure: LEFT HEART CATH AND CORONARY ANGIOGRAPHY;  Surgeon: Alwyn Pea, MD;  Location: [facility] INVASIVE CV LAB;  Service: Cardiovascular;  Laterality: N/A;  ? TEE WITHOUT CARDIOVERSION N/A 02/19/2016  ? Procedure: TRANSESOPHAGEAL ECHOCARDIOGRAM (TEE);  Surgeon: Thurmon Fair, MD;  Location: [facility] ENDOSCOPY;  Service: Cardiovascular;  Laterality: N/A;  ? TOTAL KNEE ARTHROPLASTY Right 05/26/2015  ? Procedure: RIGHT TOTAL KNEE ARTHROPLASTY;  Surgeon: Tarry Kos, MD;  Location: [facility];  Service: Orthopedics;  Laterality: Right;  ? TOTAL KNEE ARTHROPLASTY Left 01/01/2016  ? Procedure: LEFT TOTAL KNEE ARTHROPLASTY;  Surgeon: Tarry Kos, MD;  Location: [facility];  Service: Orthopedics;  Laterality: Left;  ? ? ?Home Medications:  ?Allergies as of 11/12/2021   ? ?   Reactions  ? Ace Inhibitors Swelling  ? ?  ? ?  ?Medication List  ?  ? ?  ? Accurate as of November 11, 2021  1:15 PM. If you have any questions, ask your nurse or doctor.  ?  ?  ? ?  ? ?amiodarone 200 MG tablet ?Commonly known as: PACERONE ?Take 200 mg by mouth daily. ?  ?amLODipine 5 MG tablet ?Commonly known as: NORVASC ?Take 5 mg by mouth daily. ?  ?baclofen 10 MG tablet ?Commonly known  as: LIORESAL ?Take 10 mg by mouth 2 (two) times daily as needed for muscle spasms. ?  ?Cialis 20 MG tablet ?Generic drug: tadalafil ?Take 20 mg by mouth as needed for erectile dysfunction. ?  ?diclofenac Sodium 1 % Gel ?Commonly known as: VOLTAREN ?Apply 2 g topically 4 (four) times daily as needed (pain.). ?  ?Eliquis 5 MG Tabs tablet ?Generic drug: apixaban ?Take 5 mg by mouth 2 (two) times daily. ?  ?EPINEPHrine 0.3 mg/0.3 mL Soaj injection ?Commonly known as: EPI-PEN ?Inject 0.3 mg into the muscle as needed for anaphylaxis. ?  ?ibuprofen 800 MG tablet ?Commonly known as: ADVIL ?Take 800 mg by mouth 3 (three) times daily. ?  ?isosorbide mononitrate 30 MG 24 hr tablet ?Commonly known as:  IMDUR ?Take 30 mg by mouth daily. ?  ?metoprolol succinate 25 MG 24 hr tablet ?Commonly known as: TOPROL-XL ?Take 25 mg by mouth daily. ?  ?Narcan 4 MG/0.1ML Liqd nasal spray kit ?Generic drug: naloxone ?Place 1 spray into the nose as needed (overdose). ?  ?nitroGLYCERIN 0.4 MG SL tablet ?Commonly known as: NITROSTAT ?Place 0.4 mg under the tongue every 5 (five) minutes x 3 doses as needed for chest pain. ?  ?NONFORMULARY OR COMPOUNDED ITEM ?Trimix (30/1/50)-(Pap/Phent/PGE) ?Dosage: Inject 0.8-1 cc per injection ?10 1cc vials ?Qty1 Refills 3 ?[facility] ?[phone] ?Fax [phone] ?  ?Testosterone Cypionate 200 MG/ML Soln ?Inject 0.5 mLs as directed. 0.5 ml every two weeks ?  ?testosterone cypionate 200 MG/ML injection ?Commonly known as: DEPOTESTOSTERONE CYPIONATE ?Inject 0.5 mLs (100 mg total) into the muscle every 14 (fourteen) days. ?  ?Viagra 100 MG tablet ?Generic drug: sildenafil ?Take by mouth. ?  ? ?  ? ? ?Allergies:  ?Allergies  ?Allergen Reactions  ? Ace Inhibitors Swelling  ? ? ?Family History: ?Family History  ?Problem Relation Age of Onset  ? Headache Neg Hx   ? ? ?Social History:  reports that he has never smoked. He has never used smokeless tobacco. He reports that he does not drink alcohol and does not use drugs. ? ?ROS: ?Pertinent ROS in HPI ? ?Physical Exam: ?There were no vitals taken for this visit.  ?Constitutional:  Well nourished. Alert and oriented, No acute distress. ?HEENT: [place] AT, moist mucus membranes.  Trachea midline ?Cardiovascular: No clubbing, cyanosis, or edema. ?Respiratory: Normal respiratory effort, no increased work of breathing. ?GU: No CVA tenderness.  No bladder fullness or masses.  Patient with circumcised/uncircumcised phallus. ***Foreskin easily retracted***  Urethral meatus is patent.  No penile discharge. No penile lesions or rashes. Scrotum without lesions, cysts, rashes and/or edema.  Testicles are located scrotally bilaterally. No masses are appreciated  in the testicles. Left and right epididymis are normal. ?Rectal: Patient with  normal sphincter tone. Anus and perineum without scarring or rashes. No rectal masses are appreciated. Prostate is approximately *** grams, *** nodules are appreciated. Seminal vesicles are normal. ?Neurologic: Grossly intact, no focal deficits, moving all 4 extremities. ?Psychiatric: Normal mood and affect.  ? ?Laboratory Data: ?Component ?    Latest Ref Rng & Units 12/26/2017  ?Testosterone ?    264 - 916 ng/dL 322  ? ?Component ?    Latest Ref Rng & Units 04/03/2019 09/26/2019 03/25/2020 07/14/2020  ?Prostate Specific Ag, Serum ?    0.0 - 4.0 ng/mL 1.0 0.8 1.2 0.7  ? ?Component ?    Latest Ref Rng & Units 10/24/2020 01/27/2021 04/28/2021 11/02/2021  ?Prostate Specific Ag, Serum ?    0.0 - 4.0 ng/mL  0.9 1.6 0.8 1.2  ? ?Component ?    Latest Ref Rng & Units 09/27/2018  ?Hemoglobin ?    13.0 - 17.7 g/dL 03.4  ? ?Component ?    Latest Ref Rng & Units 09/27/2018  ?HCT ?    37.5 - 51.0 % 40.9  ?I have reviewed the labs. ? ? ?Pertinent Imaging: ?N/A ? ? ?Assessment & Plan:   ? ?1. Testosterone deficiency  ?-testosterone level elevated ?-reduce testosterone cypionate 0.5 cc every 14 days ? ?2. BPH with LUTS ?-PSA stable ?-DRE benign ? ?3. Erectile dysfunction:    ?-continue Trimix (30/1/50)-refills given  ? ?No follow-ups on file. ? ?These notes generated with voice recognition software. I apologize for typographical errors. ? ?Sicily Zaragoza, PA-C ? ?[facility] ?[address]  [address] ?[place], [place] [address] ?[phone] ? ?

## 2021-11-12 ENCOUNTER — Ambulatory Visit: Payer: Medicare HMO | Admitting: Urology

## 2021-11-12 DIAGNOSIS — E349 Endocrine disorder, unspecified: Secondary | ICD-10-CM

## 2021-11-12 DIAGNOSIS — N5201 Erectile dysfunction due to arterial insufficiency: Secondary | ICD-10-CM

## 2021-11-12 DIAGNOSIS — N138 Other obstructive and reflux uropathy: Secondary | ICD-10-CM

## 2021-11-17 NOTE — Progress Notes (Signed)
11/22/21 ?5:15 PM  ? ?[name] ?1955/11/04 ?[id number] ? ?Referring provider:  ?Donnie Coffin, MD ?[address] ?[place],  [place] [address] ? ?Chief Complaint  ?Patient presents with  ? Hypogonadism  ? ? ?Urological history: ? ?1. Testosterone deficiency ?-contributing factors of age and obesity ?-testosterone 1068 ng/dL on 11/02/2021 ?-H & H on WNL on 11/02/2021 ?-managed with testosterone cypionate 200 mg/cc, 0.5 cc every 14 days ?  ?2. ED ?-contributing factors of age, HTN, BPH, testosterone deficiency and obesity ?-failed PDE5i's ?-managed with Trimix (30/1/50) 1 cc  ?- SHIM 18 ?  ?3. BPH with LU TS ?- PSA 1.2 ng/mL on  11/02/2021 ?- I PSS 18/3  ? ? ?HPI: ?Walter Hodges is a 66 y.o.male who presents today for a 3 month follow-up. ? ?He is currently receiving testosterone cypionate injections every 2 weeks through our office.  He has not had any issues with the injections.  His testosterone level is 1068 on November 02, 2021.  He received his last injection on October 27, 2021. ? ?He is not having any urinary complaints.  Patient denies any modifying or aggravating factors. Patient denies any gross hematuria, dysuria or suprapubic/flank pain.  Patient denies any fevers, chills, nausea or vomiting.  ? ? IPSS   ? ? [name] 11/19/21 0800  ?  ?  ?  ? International Prostate Symptom Score  ? How often have you had the sensation of not emptying your bladder? Not at All    ? How often have you had to urinate less than every two hours? Less than half the time    ? How often have you found you stopped and started again several times when you urinated? Not at All    ? How often have you found it difficult to postpone urination? Not at All    ? How often have you had a weak urinary stream? Not at All    ? How often have you had to strain to start urination? Not at All    ? How many times did you typically get up at night to urinate? 3 Times    ? Total IPSS Score 5    ?  ? Quality of Life due to urinary  symptoms  ? If you were to spend the rest of your life with your urinary condition just the way it is now how would you feel about that? Mixed    ? ?  ?  ? ?  ? ? ?Score:  ?1-7 Mild ?8-19 Moderate ?20-35 Severe ? ?He reports that he has soreness when he injects his Trimix. He is interested in a smaller needle. He denies curvature with his penis he says he had this when he was younger.  ? SHIM   ? ? [name] 11/19/21 0846  ?  ?  ?  ? SHIM: Over the last 6 months:  ? How do you rate your confidence that you could get and keep an erection? Low    ? When you had erections with sexual stimulation, how often were your erections hard enough for penetration (entering your partner)? Almost Never or Never    ? During sexual intercourse, how often were you able to maintain your erection after you had penetrated (entered) your partner? Almost Always or Always    ? During sexual intercourse, how difficult was it to maintain your erection to completion of intercourse? Not Difficult    ? When you attempted sexual intercourse,  how often was it satisfactory for you? Almost Always or Always    ?  ? SHIM Total Score  ? SHIM 18    ? ?  ?  ? ?  ? ? ? ? ?PMH: ?Past Medical History:  ?Diagnosis Date  ? Arthritis   ? Bell's palsy 2010  ? lasted one day ago, told that he had a ministroke  ? Dysrhythmia   ? atrial fib  ? Headache   ? Hollenhorst plaque, right eye   ? Hypertension   ? followed by Dr. Velta Addison" with [facility]    ? ? ?Surgical History: ?Past Surgical History:  ?Procedure Laterality Date  ? CARDIOVERSION N/A 05/29/2020  ? Procedure: CARDIOVERSION;  Surgeon: Yolonda Kida, MD;  Location: [facility] ORS;  Service: Cardiovascular;  Laterality: N/A;  ? CIRCUMCISION    ? done as a child- given inhalation   ? JOINT REPLACEMENT Bilateral   ? KNEE CLOSED REDUCTION Left 03/19/2016  ? Procedure: CLOSED MANIPULATION LEFT KNEE;  Surgeon: Leandrew Koyanagi, MD;  Location: [facility];  Service: Orthopedics;  Laterality: Left;  ? LEFT HEART CATH AND  CORONARY ANGIOGRAPHY N/A 11/06/2020  ? Procedure: LEFT HEART CATH AND CORONARY ANGIOGRAPHY;  Surgeon: Yolonda Kida, MD;  Location: [facility];  Service: Cardiovascular;  Laterality: N/A;  ? TEE WITHOUT CARDIOVERSION N/A 02/19/2016  ? Procedure: TRANSESOPHAGEAL ECHOCARDIOGRAM (TEE);  Surgeon: Sanda Klein, MD;  Location: [facility];  Service: Cardiovascular;  Laterality: N/A;  ? TOTAL KNEE ARTHROPLASTY Right 05/26/2015  ? Procedure: RIGHT TOTAL KNEE ARTHROPLASTY;  Surgeon: Leandrew Koyanagi, MD;  Location: [facility];  Service: Orthopedics;  Laterality: Right;  ? TOTAL KNEE ARTHROPLASTY Left 01/01/2016  ? Procedure: LEFT TOTAL KNEE ARTHROPLASTY;  Surgeon: Leandrew Koyanagi, MD;  Location: [facility];  Service: Orthopedics;  Laterality: Left;  ? ? ?Home Medications:  ?Allergies as of 11/19/2021   ? ?   Reactions  ? Ace Inhibitors Swelling  ? ?  ? ?  ?Medication List  ?  ? ?  ? Accurate as of November 19, 2021 11:59 PM. If you have any questions, ask your nurse or doctor.  ?  ?  ? ?  ? ?amiodarone 100 MG tablet ?Commonly known as: PACERONE ?amiodarone 100 mg tablet ?What changed: Another medication with the same name was removed. Continue taking this medication, and follow the directions you see here. ?Changed by: Zara Council, PA-C ?  ?amLODipine 5 MG tablet ?Commonly known as: NORVASC ?Take 5 mg by mouth daily. ?  ?baclofen 10 MG tablet ?Commonly known as: LIORESAL ?Take 10 mg by mouth 2 (two) times daily as needed for muscle spasms. ?  ?Cialis 20 MG tablet ?Generic drug: tadalafil ?Take 20 mg by mouth as needed for erectile dysfunction. ?  ?diclofenac Sodium 1 % Gel ?Commonly known as: VOLTAREN ?Apply 2 g topically 4 (four) times daily as needed (pain.). ?  ?Eliquis 5 MG Tabs tablet ?Generic drug: apixaban ?Take 5 mg by mouth 2 (two) times daily. ?  ?EPINEPHrine 0.3 mg/0.3 mL Soaj injection ?Commonly known as: EPI-PEN ?Inject 0.3 mg into the muscle as needed for anaphylaxis. ?  ?gabapentin 600 MG tablet ?Commonly known  as: NEURONTIN ?Take 600 mg by mouth 3 (three) times daily. ?  ?ibuprofen 800 MG tablet ?Commonly known as: ADVIL ?Take 800 mg by mouth 3 (three) times daily. ?  ?isosorbide mononitrate 30 MG 24 hr tablet ?Commonly known as: IMDUR ?Take 30 mg by mouth daily. ?  ?metoprolol succinate  25 MG 24 hr tablet ?Commonly known as: TOPROL-XL ?Take 25 mg by mouth daily. ?  ?Narcan 4 MG/0.1ML Liqd nasal spray kit ?Generic drug: naloxone ?Place 1 spray into the nose as needed (overdose). ?  ?nitroGLYCERIN 0.4 MG SL tablet ?Commonly known as: NITROSTAT ?Place 0.4 mg under the tongue every 5 (five) minutes x 3 doses as needed for chest pain. ?  ?NONFORMULARY OR COMPOUNDED ITEM ?Trimix (30/1/50)-(Pap/Phent/PGE) ?Dosage: Inject 0.8-1 cc per injection ?10 1cc vials ?Qty1 Refills 3 ?[facility] ?[phone] ?Fax [phone] ?  ?omeprazole 20 MG capsule ?Commonly known as: PRILOSEC ?Take 20 mg by mouth 2 (two) times daily. ?  ?oxyCODONE-acetaminophen 10-325 MG tablet ?Commonly known as: PERCOCET ?Take 1 tablet by mouth every 4 (four) hours as needed. ?  ?Testosterone Cypionate 200 MG/ML Soln ?Inject 0.5 mLs as directed. 0.5 ml every two weeks ?  ?testosterone cypionate 200 MG/ML injection ?Commonly known as: DEPOTESTOSTERONE CYPIONATE ?Inject 0.5 mLs (100 mg total) into the muscle every 14 (fourteen) days. ?  ?Viagra 100 MG tablet ?Generic drug: sildenafil ?Take by mouth. ?  ? ?  ? ? ?Allergies:  ?Allergies  ?Allergen Reactions  ? Ace Inhibitors Swelling  ? ? ?Family History: ?Family History  ?Problem Relation Age of Onset  ? Headache Neg Hx   ? ? ?Social History:  reports that he has never smoked. He has never used smokeless tobacco. He reports that he does not drink alcohol and does not use drugs. ? ? ?Physical Exam: ?BP (!) 158/92   Pulse 82   Ht 5' 10"  (1.778 m)   Wt 245 lb (111.1 kg)   BMI 35.15 kg/m?   ?Constitutional:  Well nourished. Alert and oriented, No acute distress. ?HEENT: [place] AT, moist mucus membranes.   Trachea midline ?Cardiovascular: No clubbing, cyanosis, or edema. ?Respiratory: Normal respiratory effort, no increased work of breathing. ?GU: No CVA tenderness.  No bladder fullness or masses.  Patient with circumci

## 2021-11-19 ENCOUNTER — Ambulatory Visit (INDEPENDENT_AMBULATORY_CARE_PROVIDER_SITE_OTHER): Payer: Medicare Other | Admitting: Urology

## 2021-11-19 ENCOUNTER — Other Ambulatory Visit: Payer: Self-pay

## 2021-11-19 VITALS — BP 158/92 | HR 82 | Ht 70.0 in | Wt 245.0 lb

## 2021-11-19 DIAGNOSIS — N5201 Erectile dysfunction due to arterial insufficiency: Secondary | ICD-10-CM | POA: Diagnosis not present

## 2021-11-19 DIAGNOSIS — N138 Other obstructive and reflux uropathy: Secondary | ICD-10-CM | POA: Diagnosis not present

## 2021-11-19 DIAGNOSIS — E349 Endocrine disorder, unspecified: Secondary | ICD-10-CM | POA: Diagnosis not present

## 2021-11-19 DIAGNOSIS — N401 Enlarged prostate with lower urinary tract symptoms: Secondary | ICD-10-CM

## 2021-11-19 MED ORDER — NONFORMULARY OR COMPOUNDED ITEM: 3 refills | Status: DC

## 2021-11-22 ENCOUNTER — Telehealth: Payer: Self-pay | Admitting: Urology

## 2021-11-22 ENCOUNTER — Encounter: Payer: Self-pay | Admitting: Urology

## 2021-11-22 NOTE — Telephone Encounter (Signed)
We need to get him scheduled for labs in three months for testosterone level, hemoglobin and hematocrit level.  ?

## 2021-11-23 ENCOUNTER — Ambulatory Visit: Payer: Medicare Other

## 2021-11-23 ENCOUNTER — Encounter: Payer: Self-pay | Admitting: Urology

## 2021-11-24 NOTE — Telephone Encounter (Signed)
Walter Hodges spoke with patient and scheduled appointment. ?

## 2021-11-24 NOTE — Telephone Encounter (Signed)
.  left message to have patient return my call.  

## 2021-11-26 ENCOUNTER — Encounter: Payer: Self-pay | Admitting: Urology

## 2021-11-26 ENCOUNTER — Ambulatory Visit: Payer: Medicare Other

## 2021-12-25 ENCOUNTER — Encounter: Payer: Self-pay | Admitting: Occupational Therapy

## 2021-12-25 ENCOUNTER — Ambulatory Visit: Payer: Medicare Other | Attending: Orthopedic Surgery | Admitting: Occupational Therapy

## 2021-12-25 DIAGNOSIS — M79641 Pain in right hand: Secondary | ICD-10-CM | POA: Diagnosis present

## 2021-12-25 DIAGNOSIS — G5601 Carpal tunnel syndrome, right upper limb: Secondary | ICD-10-CM | POA: Insufficient documentation

## 2021-12-25 DIAGNOSIS — R278 Other lack of coordination: Secondary | ICD-10-CM | POA: Diagnosis present

## 2021-12-25 DIAGNOSIS — M25531 Pain in right wrist: Secondary | ICD-10-CM | POA: Insufficient documentation

## 2021-12-25 DIAGNOSIS — M6281 Muscle weakness (generalized): Secondary | ICD-10-CM | POA: Insufficient documentation

## 2021-12-25 NOTE — Therapy (Signed)
[facility] ?[address] ?[place] [facility], [place], [address] ?Phone: [phone]   Fax:  [phone] ? ?Occupational Therapy Evaluation ? ?Patient Details  ?Name: Walter Hodges ?MRN: [id number] ?Date of Birth: 02-Aug-1956 ?Referring Provider (OT): [facility] ? ? ?Encounter Date: 12/25/2021 ? ? OT End of Session - 12/25/21 2054   ? ? Visit Number 1   ? Number of Visits 12   ? Date for OT Re-Evaluation 02/05/22   ? OT Start Time [phone]   ? OT Stop Time 0915   ? OT Time Calculation (min) 53 min   ? Activity Tolerance Patient tolerated treatment well   ? Behavior During Therapy [facility] for tasks assessed/performed   ? ?  ?  ? ?  ? ? ?Past Medical History:  ?Diagnosis Date  ? Arthritis   ? Bell's palsy 2010  ? lasted one day ago, told that he had a ministroke  ? Dysrhythmia   ? atrial fib  ? Headache   ? Hollenhorst plaque, right eye   ? Hypertension   ? followed by Dr. Velta [facility]" with [facility]    ? ? ?Past Surgical History:  ?Procedure Laterality Date  ? CARDIOVERSION N/A 05/29/2020  ? Procedure: CARDIOVERSION;  Surgeon: [facility] Kida, MD;  Location: [facility];  Service: Cardiovascular;  Laterality: N/A;  ? CIRCUMCISION    ? done as a child- given inhalation   ? JOINT REPLACEMENT Bilateral   ? KNEE CLOSED REDUCTION Left 03/19/2016  ? Procedure: CLOSED MANIPULATION LEFT KNEE;  Surgeon: [facility] Koyanagi, MD;  Location: [place];  Service: Orthopedics;  Laterality: Left;  ? LEFT HEART CATH AND CORONARY ANGIOGRAPHY N/A 11/06/2020  ? Procedure: LEFT HEART CATH AND CORONARY ANGIOGRAPHY;  Surgeon: [facility] Kida, MD;  Location: [place] [facility];  Service: Cardiovascular;  Laterality: N/A;  ? TEE WITHOUT CARDIOVERSION N/A 02/19/2016  ? Procedure: TRANSESOPHAGEAL ECHOCARDIOGRAM (TEE);  Surgeon: [facility] Klein, MD;  Location: [place];  Service: Cardiovascular;  Laterality: N/A;  ? TOTAL KNEE ARTHROPLASTY Right 05/26/2015  ? Procedure: RIGHT TOTAL KNEE ARTHROPLASTY;  Surgeon:  [facility] Koyanagi, MD;  Location: [place];  Service: Orthopedics;  Laterality: Right;  ? TOTAL KNEE ARTHROPLASTY Left 01/01/2016  ? Procedure: LEFT TOTAL KNEE ARTHROPLASTY;  Surgeon: [facility] Koyanagi, MD;  Location: [place];  Service: Orthopedics;  Laterality: Left;  ? ? ?There were no vitals filed for this visit. ? ? Subjective Assessment - 12/25/21 0828   ? ? Subjective  Pt reports prior to surgery, pt had pain in the right hand, numbness, duration for about 6 months or more. Reports he had surgery on 11/27/2021 and then had some issues with trigger fingers, receiving injections on 12/04/2021 and was referred to OT for evaluation and treatment.   ? Pertinent History [name] is a 66 y.o. male who presents today status post right carpal tunnel release by Dr. Hessie [facility] on 11/27/2021. Patient having a lot of swelling, burning sensation throughout the fingers. He describes sharp bolts of lightning shooting to the fingertips.  After surgery, pt presented with Right middle and left ring finger with catching and locking with tenderness along the A1 pulley with no warmth, redness. Sensation is intact. Extensor tendons tracking well.  Pt received cortisone injections on 12/04/2021  right middle and a left ring trigger finger   ? Patient Stated Goals Pt would like to get function back in his hand without pain, perform self care better.   ?  Currently in Pain? Yes   ? Pain Score 7    ? Pain Location Hand   ? Pain Orientation Right   ? Pain Descriptors / Indicators Aching   ? Pain Type Surgical pain   ? Pain Onset More than a month ago   ? Pain Frequency Constant   ? Multiple Pain Sites No   ? ?  ?  ? ?  ? ? ? ? [facility] OT Assessment - 12/25/21 0831   ? ?  ? Assessment  ? Medical Diagnosis s/p carpal tunnel release   ? Referring Provider (OT) [facility]   ? Onset Date/Surgical Date 11/27/21   ? Hand Dominance Right   ?  ? Prior Function  ? Level of Independence Independent   ? Vocation Part time employment   ? Vocation Requirements  salesman   ?  ? ADL  ? ADL comments Pt reports difficulty tying shoes, holding toothbrush and utensils, picking up money, managing buttons.  Wife does the housework and cooking.   ?  ? IADL  ? Community Mobility Drives own vehicle   ? Medication Management Is responsible for taking medication in correct dosages at correct time   ?  ? Written Expression  ? Dominant Hand Right   ?  ? Observation/Other Assessments  ? Focus on Therapeutic Outcomes (FOTO)  50   ?  ? Sensation  ? Light Touch Impaired by gross assessment   ? Hot/Cold Appears Intact   ?  ? AROM  ? Overall AROM Comments Pt with bilateral opposition to index and middle fingers only, unable to oppose thumb to ring and small.  Full fisting bilaterally. Pt has prior shoulder injury with rotator cuff issues.   ? Right Shoulder Flexion 145 Degrees   ? Left Shoulder Flexion 130 Degrees   ? Right Forearm Pronation 90 Degrees   ? Right Forearm Supination 60 Degrees   ? Left Forearm Pronation 90 Degrees   ? Left Forearm Supination 60 Degrees   ? Right Wrist Extension 40 Degrees   ? Right Wrist Flexion 75 Degrees   ? Left Wrist Extension 47 Degrees   ? Left Wrist Flexion 60 Degrees   ?  ? Right Hand AROM  ? R Index  MCP 0-90 75 Degrees   ? R Index PIP 0-100 90 Degrees   ? R Long  MCP 0-90 60 Degrees   ? R Long PIP 0-100 90 Degrees   lacks -10 extension of PIP  ? R Ring  MCP 0-90 60 Degrees   ? R Ring PIP 0-100 90 Degrees   ? R Little  MCP 0-90 65 Degrees   ? R Little PIP 0-100 90 Degrees   ? ?  ?  ? ?  ? ?No tenderness this date at A1 pulleys bilaterally from past trigger finger symptoms.   ? ?Pt seen for use of contrast for 9 mins with instruction and handout for edema management of right hand/wrist to decrease inflammation, increase tissue mobility and to improve ROM.   ?  ?Manual therapy:  Pt seen for soft tissue massage to hand, wrist and forearm, metacarpal spreads, spread to webspace.  Scar massage instruction for home program.  Issued CICA care scar pad to use  at home for nighttime.  ? ?Issued red foam to place on toothbrush at home.  ? ?Tendon gliding exercises with instruction and written home program. ?  ?Medbridge Exercises for home program for contrast bath and tendon gliding  ?Access Code: [email or web address] ?  URL: [email or web address] ?Date: 12/25/2021 ?Prepared by: Garlon Hatchet ? ?Exercises ?- Hand AROM Tendon Gliding Series  - 1 x daily - 7 x weekly - 1-2 sets - 10 reps ? ? ? ? ? ? ? ? ? ? ? ? ? ? ? ? ? OT Education - 12/25/21 2054   ? ? Education Details role of OT, goals, POC, contrast, tendon gliding exercises   ? Person(s) Educated Patient   ? Methods Explanation;Demonstration;Handout   ? Comprehension Returned demonstration;Verbalized understanding;Need further instruction   ? ?  ?  ? ?  ? ? ? ? ? ? OT Long Term Goals - 12/25/21 2103   ? ?  ? OT LONG TERM GOAL #1  ? Title Patient to be independent in home program to decrease numbness and pain in the right hand   ?  ? OT LONG TERM GOAL #2  ? Title Pt will improve FOTO score to 70 or greater to show a clinically relevant change to impact ADL independence.   ? Baseline Eval score of 50   ? Time 6   ? Period Weeks   ? Status New   ? Target Date 02/05/22   ?  ? OT LONG TERM GOAL #3  ? Title Right hand wrist and digits AROM  improve to within functional limits with no increase symptoms   ? Baseline decreased ROM and opposition of right hand   ? Time 6   ? Period Weeks   ? Status New   ? Target Date 02/05/22   ?  ? OT LONG TERM GOAL #4  ? Title Pt to complete basic self care tasks with modified independence including tying shoes   ? Baseline difficulty with tying shoes   ? Time 3   ? Period Weeks   ? Status New   ? Target Date 01/15/22   ?  ? OT LONG TERM GOAL #5  ? Title Pt able to demonstrate ability to pick up coins from tabletop with modified independence   ? Baseline difficulty at eval   ? Time 6   ? Period Weeks   ? Target Date 02/05/22   ?  ? Long Term Additional Goals  ? Additional Long Term Goals Yes    ? ?  ?  ? ?  ? ? ? ? ? ? ? ? Plan - 12/25/21 2055   ? ? Clinical Impression Statement Pt is a 66 yo old with recent carpal tunnel surgery to right wrist/hand on 11/27/2021.  Pt also had symptoms of trigger fing

## 2022-01-01 ENCOUNTER — Ambulatory Visit: Payer: Medicare Other | Admitting: Occupational Therapy

## 2022-01-04 ENCOUNTER — Ambulatory Visit (INDEPENDENT_AMBULATORY_CARE_PROVIDER_SITE_OTHER): Payer: Medicare Other | Admitting: Physician Assistant

## 2022-01-04 DIAGNOSIS — E291 Testicular hypofunction: Secondary | ICD-10-CM | POA: Diagnosis not present

## 2022-01-04 MED ORDER — TESTOSTERONE CYPIONATE 200 MG/ML IM SOLN
100.0000 mg | Freq: Once | INTRAMUSCULAR | Status: AC
  Administered 2022-01-04: 100 mg via INTRAMUSCULAR

## 2022-01-04 NOTE — Progress Notes (Signed)
Testosterone IM Injection ? ?Due to Hypogonadism patient is present today for a Testosterone Injection. ? ?Medication: Testosterone Cypionate ?Dose: 100mg /0.58mL ?Location: right upper outer buttocks ?Lot: [id number] ?Exp:04/2023 ? ?Patient tolerated well, no complications were noted. ? ?Performed by: Gordy Clement, [place] ? ?Follow up: RTC in 2 weeks for testosterone injection ? ?

## 2022-01-14 ENCOUNTER — Encounter: Payer: Self-pay | Admitting: Occupational Therapy

## 2022-01-14 ENCOUNTER — Ambulatory Visit: Payer: Medicare Other | Attending: Orthopedic Surgery | Admitting: Occupational Therapy

## 2022-01-14 DIAGNOSIS — M6281 Muscle weakness (generalized): Secondary | ICD-10-CM | POA: Diagnosis not present

## 2022-01-14 DIAGNOSIS — R278 Other lack of coordination: Secondary | ICD-10-CM

## 2022-01-14 DIAGNOSIS — M79641 Pain in right hand: Secondary | ICD-10-CM | POA: Diagnosis present

## 2022-01-14 DIAGNOSIS — M25531 Pain in right wrist: Secondary | ICD-10-CM | POA: Diagnosis present

## 2022-01-14 DIAGNOSIS — G5601 Carpal tunnel syndrome, right upper limb: Secondary | ICD-10-CM | POA: Diagnosis present

## 2022-01-14 NOTE — Therapy (Signed)
[place] [facility] MAIN [facility] SERVICES [address] [place], [place], [address] Phone: [phone]   Fax:  [phone]  Occupational Therapy Treatment  Patient Details  Name: [facility] MRN: [id number] Date of Birth: [birth year] Referring Provider (OT): Rosita Kea   Encounter Date: 01/14/2022   OT End of Session - 01/22/22 1358     Visit Number 2    Number of Visits 12    Date for OT Re-Evaluation 02/05/22    OT Start Time 0830    OT Stop Time 0915    OT Time Calculation (min) 45 min    Activity Tolerance Patient tolerated treatment well    Behavior During Therapy [facility] for tasks assessed/performed             Past Medical History:  Diagnosis Date   Arthritis    Bell's palsy 2010   lasted one day ago, told that he had a ministroke   Dysrhythmia    atrial fib   Headache    Hollenhorst plaque, right eye    Hypertension    followed by Dr. Nita Sells" with [facility]      Past Surgical History:  Procedure Laterality Date   CARDIOVERSION N/A 05/29/2020   Procedure: CARDIOVERSION;  Surgeon: Alwyn Pea, MD;  Location: [facility];  Service: Cardiovascular;  Laterality: N/A;   CIRCUMCISION     done as a child- given inhalation    JOINT REPLACEMENT Bilateral    KNEE CLOSED REDUCTION Left 03/19/2016   Procedure: CLOSED MANIPULATION LEFT KNEE;  Surgeon: Tarry Kos, MD;  [facility];  Service: Orthopedics;  Laterality: Left;   LEFT HEART CATH AND CORONARY ANGIOGRAPHY N/A 11/06/2020   Procedure: LEFT HEART CATH AND CORONARY ANGIOGRAPHY;  Surgeon: Alwyn Pea, MD;  Location: [facility];  Service: Cardiovascular;  Laterality: N/A;   TEE WITHOUT CARDIOVERSION N/A 02/19/2016   Procedure: TRANSESOPHAGEAL ECHOCARDIOGRAM (TEE);  Surgeon: Thurmon Fair, MD;  Location: [facility];  Service: Cardiovascular;  Laterality: N/A;   TOTAL KNEE ARTHROPLASTY Right 05/26/2015   Procedure: RIGHT TOTAL KNEE ARTHROPLASTY;  Surgeon: Tarry Kos, MD;  [facility];  Service: Orthopedics;  Laterality: Right;   TOTAL KNEE ARTHROPLASTY Left 01/01/2016   Procedure: LEFT TOTAL KNEE ARTHROPLASTY;  Surgeon: Tarry Kos, MD;  [facility];  Service: Orthopedics;  Laterality: Left;    There were no vitals filed for this visit.   Subjective Assessment - 01/22/22 1357     Subjective  Pt reports pain 7/10 numbness in index, middle and thumb, feels like an ache all the time.    Pertinent History [facility] is a 66 y.o. male who presents today status post right carpal tunnel release by Dr. Kennedy Bucker on 11/27/2021. Patient having a lot of swelling, burning sensation throughout the fingers. He describes sharp bolts of lightning shooting to the fingertips.  After surgery, pt presented with Right middle and left ring finger with catching and locking with tenderness along the A1 pulley with no warmth, redness. Sensation is intact. Extensor tendons tracking well.  Pt received cortisone injections on 12/04/2021  right middle and a left ring trigger finger    Patient Stated Goals Pt would like to get function back in his hand without pain, perform self care better.    Currently in Pain? Yes    Pain Score 7     Pain Location Hand    Pain Orientation Right    Pain Descriptors / Indicators  Long Term Additional Goals   Additional Long Term Goals Yes                   Plan - 01/22/22 1359     Clinical Impression Statement Pt demonstrates difficulty with thumb palmar ABD and able to oppose thumb to index and middle digits.  Therapist able to passively move thumb through the ROM for opposition to ring and small.  Pt reports he has done some exercises at home but still has pain present.  Pain does decrease after therapeutic intervention to around 3/10.  Continue to work towards goals in plan of care to improve ROM, decrease pain and increase functional use in necessary daily tasks.    OT Occupational Profile and History Comprehensive Assessment- Review of records and extensive additional review of physical, cognitive, psychosocial history related to current functional performance    Occupational performance deficits (Please refer to evaluation for details): ADL's;IADL's;Leisure    Body Structure / Function / Physical Skills ADL;Coordination;Scar mobility;UE functional use;Sensation;IADL;Pain;Flexibility;Dexterity;FMC;Strength;Edema;ROM    Psychosocial Skills Habits;Environmental  Adaptations;Routines and Behaviors    Rehab Potential Good    Clinical Decision Making Limited treatment options, no task modification necessary    Comorbidities Affecting Occupational Performance: May have comorbidities impacting occupational performance    Modification or Assistance to Complete Evaluation  No modification of tasks or assist necessary to complete eval    OT Frequency 2x / week    OT Duration 6 weeks    OT Treatment/Interventions Self-care/ADL training;Cryotherapy;Paraffin;Therapeutic exercise;DME and/or AE  instruction;Ultrasound;Fluidtherapy;Neuromuscular education;Manual Therapy;Scar mobilization;Splinting;Moist Heat;Contrast Bath;Passive range of motion;Therapeutic activities;Coping strategies training;Patient/family education    Consulted and Agree with Plan of Care Patient             Patient will benefit from skilled therapeutic intervention in order to improve the following deficits and impairments:   Body Structure / Function / Physical Skills: ADL, Coordination, Scar mobility, UE functional use, Sensation, IADL, Pain, Flexibility, Dexterity, FMC, Strength, Edema, ROM   Psychosocial Skills: Habits, Environmental  Adaptations, Routines and Behaviors   Visit Diagnosis: Muscle weakness (generalized)  Other lack of coordination  Pain in right wrist  Pain in right hand  Carpal tunnel syndrome, right upper limb    Problem List Patient Active Problem List   Diagnosis Date Noted   Degenerative joint disease of shoulder region 02/10/2021   Carpal tunnel syndrome of left wrist 02/10/2021   Fibrillation, atrial (HCC) 01/10/2020   Obesity, unspecified 08/18/2017   Shoulder joint pain 07/05/2016   Small Ostium secundum type atrial septal defect with septal aneurysm 03/22/2016   Pain in knee 03/19/2016   Embolism involving retinal artery    Insomnia 02/16/2016   Hollenhorst plaque, right eye 02/16/2016   Broken tooth, complicated 01/23/2016   Hypertension 01/07/2016   Total knee replacement status 01/01/2016   Primary osteoarthritis of right knee 05/26/2015   Benign essential hypertension 04/17/2009   Male erectile disorder (CODE) 12/16/2008   Karrissa Parchment Cornelius Moras, OTR/L, CLT  Juliany Daughety, OT 01/23/2022, 2:11 PM  [place] [facility] MAIN [facility] SERVICES [address] [place], [place], [address] Phone: [phone]   Fax:  [phone]  Name: FITZROY MIKAMI MRN: [id number] Date of Birth: 1956/01/01  [place] [facility] MAIN [facility] SERVICES [address] [place], [place], [address] Phone: [phone]   Fax:  [phone]  Occupational Therapy Treatment  Patient Details  Name: [facility] MRN: [id number] Date of Birth: [birth year] Referring Provider (OT): Rosita Kea   Encounter Date: 01/14/2022   OT End of Session - 01/22/22 1358     Visit Number 2    Number of Visits 12    Date for OT Re-Evaluation 02/05/22    OT Start Time 0830    OT Stop Time 0915    OT Time Calculation (min) 45 min    Activity Tolerance Patient tolerated treatment well    Behavior During Therapy [facility] for tasks assessed/performed             Past Medical History:  Diagnosis Date   Arthritis    Bell's palsy 2010   lasted one day ago, told that he had a ministroke   Dysrhythmia    atrial fib   Headache    Hollenhorst plaque, right eye    Hypertension    followed by Dr. Nita Sells" with [facility]      Past Surgical History:  Procedure Laterality Date   CARDIOVERSION N/A 05/29/2020   Procedure: CARDIOVERSION;  Surgeon: Alwyn Pea, MD;  Location: [facility];  Service: Cardiovascular;  Laterality: N/A;   CIRCUMCISION     done as a child- given inhalation    JOINT REPLACEMENT Bilateral    KNEE CLOSED REDUCTION Left 03/19/2016   Procedure: CLOSED MANIPULATION LEFT KNEE;  Surgeon: Tarry Kos, MD;  [facility];  Service: Orthopedics;  Laterality: Left;   LEFT HEART CATH AND CORONARY ANGIOGRAPHY N/A 11/06/2020   Procedure: LEFT HEART CATH AND CORONARY ANGIOGRAPHY;  Surgeon: Alwyn Pea, MD;  Location: [facility] INVASIVE [facility];  Service: Cardiovascular;  Laterality: N/A;   TEE WITHOUT CARDIOVERSION N/A 02/19/2016   Procedure: TRANSESOPHAGEAL ECHOCARDIOGRAM (TEE);  Surgeon: Thurmon Fair, MD;  Location: [facility] ENDOSCOPY;  Service: Cardiovascular;  Laterality: N/A;   TOTAL KNEE ARTHROPLASTY Right 05/26/2015   Procedure: RIGHT TOTAL KNEE ARTHROPLASTY;  Surgeon: Tarry Kos, MD;  [facility];  Service: Orthopedics;  Laterality: Right;   TOTAL KNEE ARTHROPLASTY Left 01/01/2016   Procedure: LEFT TOTAL KNEE ARTHROPLASTY;  Surgeon: Tarry Kos, MD;  [facility];  Service: Orthopedics;  Laterality: Left;    There were no vitals filed for this visit.   Subjective Assessment - 01/22/22 1357     Subjective  Pt reports pain 7/10 numbness in index, middle and thumb, feels like an ache all the time.    Pertinent History Walter Hodges is a 66 y.o. male who presents today status post right carpal tunnel release by Dr. Kennedy Bucker on 11/27/2021. Patient having a lot of swelling, burning sensation throughout the fingers. He describes sharp bolts of lightning shooting to the fingertips.  After surgery, pt presented with Right middle and left ring finger with catching and locking with tenderness along the A1 pulley with no warmth, redness. Sensation is intact. Extensor tendons tracking well.  Pt received cortisone injections on 12/04/2021  right middle and a left ring trigger finger    Patient Stated Goals Pt would like to get function back in his hand without pain, perform self care better.    Currently in Pain? Yes    Pain Score 7     Pain Location Hand    Pain Orientation Right    Pain Descriptors / Indicators

## 2022-01-18 ENCOUNTER — Ambulatory Visit (INDEPENDENT_AMBULATORY_CARE_PROVIDER_SITE_OTHER): Payer: Medicare Other | Admitting: Physician Assistant

## 2022-01-18 DIAGNOSIS — E291 Testicular hypofunction: Secondary | ICD-10-CM | POA: Diagnosis not present

## 2022-01-18 MED ORDER — TESTOSTERONE CYPIONATE 200 MG/ML IM SOLN
100.0000 mg | Freq: Once | INTRAMUSCULAR | Status: AC
  Administered 2022-01-18: 100 mg via INTRAMUSCULAR

## 2022-01-18 NOTE — Progress Notes (Signed)
Testosterone IM Injection ? ?Due to Hypogonadism patient is present today for a Testosterone Injection. ? ?Medication: Testosterone Cypionate ?Dose: 100mg /0.73mL ?Location: right upper outer buttocks ?Lot: 4m ?Exp:04/2023 ? ?Patient tolerated well, no complications were noted. ? ?Performed by: [id number], CMA  ? ?Follow up: RTC in 2 weeks Testosterone Injection  ? ?

## 2022-01-19 ENCOUNTER — Ambulatory Visit: Payer: Medicare Other | Admitting: Occupational Therapy

## 2022-01-21 ENCOUNTER — Ambulatory Visit: Payer: Medicare Other | Admitting: Occupational Therapy

## 2022-01-21 DIAGNOSIS — M6281 Muscle weakness (generalized): Secondary | ICD-10-CM | POA: Diagnosis not present

## 2022-01-21 DIAGNOSIS — M79641 Pain in right hand: Secondary | ICD-10-CM

## 2022-01-21 DIAGNOSIS — M25531 Pain in right wrist: Secondary | ICD-10-CM

## 2022-01-21 DIAGNOSIS — R278 Other lack of coordination: Secondary | ICD-10-CM

## 2022-01-21 DIAGNOSIS — G5601 Carpal tunnel syndrome, right upper limb: Secondary | ICD-10-CM

## 2022-01-23 NOTE — Therapy (Signed)
[facility] MAIN [facility] SERVICES [address] [place], [place], [address] Phone: [phone]   Fax:  [phone]  Occupational Therapy Treatment  Patient Details  Name: Walter Hodges MRN: [id number] Date of Birth: Jan 23, 1956 Referring Provider (OT): Rosita Kea   Encounter Date: 01/21/2022   OT End of Session - 01/23/22 1505     Visit Number 3    Number of Visits 12    Date for OT Re-Evaluation 02/05/22    OT Start Time 0845    OT Stop Time 0931    OT Time Calculation (min) 46 min    Activity Tolerance Patient tolerated treatment well    Behavior During Therapy [facility] for tasks assessed/performed             Past Medical History:  Diagnosis Date   Arthritis    Bell's palsy 2010   lasted one day ago, told that he had a ministroke   Dysrhythmia    atrial fib   Headache    Hollenhorst plaque, right eye    Hypertension    followed by Dr. Nita Sells" with [facility]      Past Surgical History:  Procedure Laterality Date   CARDIOVERSION N/A 05/29/2020   Procedure: CARDIOVERSION;  Surgeon: Alwyn Pea, MD;  Location: [facility] ORS;  Service: Cardiovascular;  Laterality: N/A;   CIRCUMCISION     done as a child- given inhalation    JOINT REPLACEMENT Bilateral    KNEE CLOSED REDUCTION Left 03/19/2016   Procedure: CLOSED MANIPULATION LEFT KNEE;  Surgeon: Tarry Kos, MD;  Location: [facility] OR;  Service: Orthopedics;  Laterality: Left;   LEFT HEART CATH AND CORONARY ANGIOGRAPHY N/A 11/06/2020   Procedure: LEFT HEART CATH AND CORONARY ANGIOGRAPHY;  Surgeon: Alwyn Pea, MD;  Location: [facility] INVASIVE CV LAB;  Service: Cardiovascular;  Laterality: N/A;   TEE WITHOUT CARDIOVERSION N/A 02/19/2016   Procedure: TRANSESOPHAGEAL ECHOCARDIOGRAM (TEE);  Surgeon: Thurmon Fair, MD;  Location: [facility] ENDOSCOPY;  Service: Cardiovascular;  Laterality: N/A;   TOTAL KNEE ARTHROPLASTY Right 05/26/2015   Procedure: RIGHT TOTAL KNEE ARTHROPLASTY;  Surgeon: Tarry Kos, MD;  Location: [facility] OR;  Service: Orthopedics;  Laterality: Right;   TOTAL KNEE ARTHROPLASTY Left 01/01/2016   Procedure: LEFT TOTAL KNEE ARTHROPLASTY;  Surgeon: Tarry Kos, MD;  Location: [facility] OR;  Service: Orthopedics;  Laterality: Left;    There were no vitals filed for this visit.   Subjective Assessment - 01/23/22 1503     Subjective  Numbness across the top of fingers and in the hand it feels like shock waves (not pins and needles)    Pertinent History Walter Hodges is a 66 y.o. male who presents today status post right carpal tunnel release by Dr. Kennedy Bucker on 11/27/2021. Patient having a lot of swelling, burning sensation throughout the fingers. He describes sharp bolts of lightning shooting to the fingertips.  After surgery, pt presented with Right middle and left ring finger with catching and locking with tenderness along the A1 pulley with no warmth, redness. Sensation is intact. Extensor tendons tracking well.  Pt received cortisone injections on 12/04/2021  right middle and a left ring trigger finger    Patient Stated Goals Pt would like to get function back in his hand without pain, perform self care better.    Currently in Pain? Yes    Pain Score 5     Pain Location Hand    Pain Orientation Right    Pain Descriptors /  Indicators Aching;Sharp    Pain Type Surgical pain    Pain Onset More than a month ago    Pain Frequency Constant              Patient reports his hand felt much better for a while after the last session.  Contrast: Pt seen this date with use of Contrast bath for 11 mins, alternating hot and cold to decrease edema, inflammation and to increase tissue mobility and prepare hand for exercises.  Manual therapy: Therapist performed soft tissue mobilizations to right hand, carpal and metacarpal spread to promote decreased edema and improved lymphatic flow as well as Scar massage to surgical scar from CTR.  Use of Mindi Slicker tool this date to right  forearm, dorsal and volar surface to decrease adhesions and restore tissue.  Manual therapy performed prior to and independent of therapeutic exercises.   Therapeutic exercise: Pt seen for A/AROM to right hand, tendon gliding exercises 10 reps and median nerve glides for 5 reps, pt continues to require occasional cues for proper form and technique.  Palmar ABD, radial ABD, thumb flexion/ext and oppositional movements.   PROM to thumb for oppositional movements towards ring and small finger with reattempts at active movement afterwards.  Therapist ordered a d ring wrist brace and awaiting order to fit patient to wear during heavy tasks and at night.    Issued CICA scar to place over scar at night to promote healing, hydration to the scar.     Response to treatment: Patient responding well to manual therapy techniques and reports decreased pain after session.  He still requires some occasional cues for proper form and technique with exercises.  He is unable to pinpoint any work-related activities that might involve either heavy work, lifting or repetitive motions that need to be modified we will continue to assess his work-related skills in coming sessions.  Issued CICA care scar pad to wear at night over the next week.  Therapist ordered a D-ring wrist brace and awaiting order to be received to fit patient.  Continue to work towards goals to improve range of motion, strength and improved functional use in his right hand and wrist.                        OT Education - 01/23/22 1505     Education Details contrast, scar massage, ROM exs    Person(s) Educated Patient    Methods Explanation;Demonstration;Handout    Comprehension Returned demonstration;Verbalized understanding;Need further instruction                 OT Long Term Goals - 12/25/21 2103       OT LONG TERM GOAL #1   Title Patient to be independent in home program to decrease numbness and pain in the  right hand      OT LONG TERM GOAL #2   Title Pt will improve FOTO score to 70 or greater to show a clinically relevant change to impact ADL independence.    Baseline Eval score of 50    Time 6    Period Weeks    Status New    Target Date 02/05/22      OT LONG TERM GOAL #3   Title Right hand wrist and digits AROM  improve to within functional limits with no increase symptoms    Baseline decreased ROM and opposition of right hand    Time 6    Period Weeks  Status New    Target Date 02/05/22      OT LONG TERM GOAL #4   Title Pt to complete basic self care tasks with modified independence including tying shoes    Baseline difficulty with tying shoes    Time 3    Period Weeks    Status New    Target Date 01/15/22      OT LONG TERM GOAL #5   Title Pt able to demonstrate ability to pick up coins from tabletop with modified independence    Baseline difficulty at eval    Time 6    Period Weeks    Target Date 02/05/22      Long Term Additional Goals   Additional Long Term Goals Yes                   Plan - 01/23/22 1506     Clinical Impression Statement Patient responding well to manual therapy techniques and reports decreased pain after session.  He still requires some occasional cues for proper form and technique with exercises.  He is unable to pinpoint any work-related activities that might involve either heavy work, lifting or repetitive motions that need to be modified we will continue to assess his work-related skills in coming sessions.  Issued CICA care scar pad to wear at night over the next week.  Therapist ordered a D-ring wrist brace and awaiting order to be received to fit patient.  Continue to work towards goals to improve range of motion, strength and improved functional use in his right hand and wrist.    OT Occupational Profile and History Comprehensive Assessment- Review of records and extensive additional review of physical, cognitive, psychosocial  history related to current functional performance    Occupational performance deficits (Please refer to evaluation for details): ADL's;IADL's;Leisure    Body Structure / Function / Physical Skills ADL;Coordination;Scar mobility;UE functional use;Sensation;IADL;Pain;Flexibility;Dexterity;FMC;Strength;Edema;ROM    Psychosocial Skills Habits;Environmental  Adaptations;Routines and Behaviors    Rehab Potential Good    Clinical Decision Making Limited treatment options, no task modification necessary    Comorbidities Affecting Occupational Performance: May have comorbidities impacting occupational performance    Modification or Assistance to Complete Evaluation  No modification of tasks or assist necessary to complete eval    OT Frequency 2x / week    OT Duration 6 weeks    OT Treatment/Interventions Self-care/ADL training;Cryotherapy;Paraffin;Therapeutic exercise;DME and/or AE instruction;Ultrasound;Fluidtherapy;Neuromuscular education;Manual Therapy;Scar mobilization;Splinting;Moist Heat;Contrast Bath;Passive range of motion;Therapeutic activities;Coping strategies training;Patient/family education    Consulted and Agree with Plan of Care Patient             Patient will benefit from skilled therapeutic intervention in order to improve the following deficits and impairments:   Body Structure / Function / Physical Skills: ADL, Coordination, Scar mobility, UE functional use, Sensation, IADL, Pain, Flexibility, Dexterity, FMC, Strength, Edema, ROM   Psychosocial Skills: Habits, Environmental  Adaptations, Routines and Behaviors   Visit Diagnosis: Muscle weakness (generalized)  Other lack of coordination  Pain in right wrist  Pain in right hand  Carpal tunnel syndrome, right upper limb    Problem List Patient Active Problem List   Diagnosis Date Noted   Degenerative joint disease of shoulder region 02/10/2021   Carpal tunnel syndrome of left wrist 02/10/2021   Fibrillation, atrial  (HCC) 01/10/2020   Obesity, unspecified 08/18/2017   Shoulder joint pain 07/05/2016   Small Ostium secundum type atrial septal defect with septal aneurysm 03/22/2016   Pain in knee 03/19/2016  Embolism involving retinal artery    Insomnia 02/16/2016   Hollenhorst plaque, right eye 02/16/2016   Broken tooth, complicated 01/23/2016   Hypertension 01/07/2016   Total knee replacement status 01/01/2016   Primary osteoarthritis of right knee 05/26/2015   Benign essential hypertension 04/17/2009   Male erectile disorder (CODE) 12/16/2008   Walter Hodges, OTR/L, CLT  Walter Hodges, OT 01/23/2022, 3:17 PM  [facility] MAIN [facility] SERVICES [address] [place], [place], [address] Phone: [phone]   Fax:  [phone]  Name: Walter Hodges MRN: [id number] Date of Birth: 1956/02/02

## 2022-01-26 ENCOUNTER — Encounter: Payer: Medicare Other | Admitting: Occupational Therapy

## 2022-01-27 ENCOUNTER — Ambulatory Visit: Payer: Medicare Other | Admitting: Occupational Therapy

## 2022-01-28 ENCOUNTER — Ambulatory Visit: Payer: Medicare Other | Admitting: Occupational Therapy

## 2022-01-28 ENCOUNTER — Encounter: Payer: Self-pay | Admitting: Occupational Therapy

## 2022-01-28 DIAGNOSIS — M25531 Pain in right wrist: Secondary | ICD-10-CM

## 2022-01-28 DIAGNOSIS — R278 Other lack of coordination: Secondary | ICD-10-CM

## 2022-01-28 DIAGNOSIS — M79641 Pain in right hand: Secondary | ICD-10-CM

## 2022-01-28 DIAGNOSIS — M6281 Muscle weakness (generalized): Secondary | ICD-10-CM

## 2022-01-28 DIAGNOSIS — G5601 Carpal tunnel syndrome, right upper limb: Secondary | ICD-10-CM

## 2022-01-28 NOTE — Therapy (Addendum)
[facility] MAIN [facility] SERVICES [address] [place], [place], [address] Phone: [phone]   Fax:  [phone]  Occupational Therapy Treatment  Patient Details  Name: Walter Hodges MRN: [id number] Date of Birth: 1955-11-04 Referring Provider (OT): Rosita Kea   Encounter Date: 01/28/2022   OT End of Session - 01/28/22 1845     Visit Number 4    Number of Visits 12    Date for OT Re-Evaluation 02/05/22    OT Start Time 0904    OT Stop Time 0930    OT Time Calculation (min) 26 min    Activity Tolerance Patient tolerated treatment well    Behavior During Therapy [facility] for tasks assessed/performed             Past Medical History:  Diagnosis Date   Arthritis    Bell's palsy 2010   lasted one day ago, told that he had a ministroke   Dysrhythmia    atrial fib   Headache    Hollenhorst plaque, right eye    Hypertension    followed by Dr. Nita Sells" with [facility]      Past Surgical History:  Procedure Laterality Date   CARDIOVERSION N/A 05/29/2020   Procedure: CARDIOVERSION;  Surgeon: Alwyn Pea, MD;  Location: [facility] ORS;  Service: Cardiovascular;  Laterality: N/A;   CIRCUMCISION     done as a child- given inhalation    JOINT REPLACEMENT Bilateral    KNEE CLOSED REDUCTION Left 03/19/2016   Procedure: CLOSED MANIPULATION LEFT KNEE;  Surgeon: Tarry Kos, MD;  Location: [facility] OR;  Service: Orthopedics;  Laterality: Left;   LEFT HEART CATH AND CORONARY ANGIOGRAPHY N/A 11/06/2020   Procedure: LEFT HEART CATH AND CORONARY ANGIOGRAPHY;  Surgeon: Alwyn Pea, MD;  Location: [facility] INVASIVE CV LAB;  Service: Cardiovascular;  Laterality: N/A;   TEE WITHOUT CARDIOVERSION N/A 02/19/2016   Procedure: TRANSESOPHAGEAL ECHOCARDIOGRAM (TEE);  Surgeon: Thurmon Fair, MD;  Location: [facility] ENDOSCOPY;  Service: Cardiovascular;  Laterality: N/A;   TOTAL KNEE ARTHROPLASTY Right 05/26/2015   Procedure: RIGHT TOTAL KNEE ARTHROPLASTY;  Surgeon: Tarry Kos, MD;  Location: [facility] OR;  Service: Orthopedics;  Laterality: Right;   TOTAL KNEE ARTHROPLASTY Left 01/01/2016   Procedure: LEFT TOTAL KNEE ARTHROPLASTY;  Surgeon: Tarry Kos, MD;  Location: [facility] OR;  Service: Orthopedics;  Laterality: Left;    There were no vitals filed for this visit.   Subjective Assessment - 01/28/22 1843     Subjective  Pt reports pain is easing at times, still feels numb and not like his hand.    Pertinent History Walter Hodges is a 66 y.o. male who presents today status post right carpal tunnel release by Dr. Kennedy Bucker on 11/27/2021. Patient having a lot of swelling, burning sensation throughout the fingers. He describes sharp bolts of lightning shooting to the fingertips.  After surgery, pt presented with Right middle and left ring finger with catching and locking with tenderness along the A1 pulley with no warmth, redness. Sensation is intact. Extensor tendons tracking well.  Pt received cortisone injections on 12/04/2021  right middle and a left ring trigger finger    Patient Stated Goals Pt would like to get function back in his hand without pain, perform self care better.    Currently in Pain? No/denies    Pain Score 0-No pain              Manual therapy: Therapist performed soft tissue mobilizations to  right hand, carpal and metacarpal spread to promote decreased edema and improved lymphatic flow as well as Scar massage to surgical scar from CTR.  Manual therapy performed prior to and independent of therapeutic exercises.   Therapeutic exercise: Pt seen for A/AROM to right hand, tendon gliding exercises 10 reps and median nerve glides for 5 reps, pt continues to require occasional cues for proper form and technique.  Palmar ABD, radial ABD, thumb flexion/ext and oppositional movements.   PROM to thumb for oppositional movements towards ring and small finger with reattempts at active movement afterwards.   Orthotic Fit: Pt fitted for large d ring  wrist splint to right hand and wrist, adjustments to velcro straps.  Pt instructed on wear and care, recommend he wear splint during heavier tasks and at night as needed.  He can remove during the day with light tasks and to complete exercises and ROM multiple times a day.      Pt continues to use CICA scar to place over scar at night to promote healing, hydration to the scar.     Response to tx:   Pt arrived late to appt therefore tx time was shortened.  Pt fitted for right d ring wrist brace to use during heavy work tasks and at night for wrist support as needed.  Pain has decreased overall but still reports numbness in right hand.  Recommended pt continue to perform contrast bath multiple times daily, ROM exercises, use of CICA care for scar, massage and use of splint as needed.  Continue OT to improve right UE functional use in daily tasks.   Rationale for Evaluation and Treatment Rehabilitation            OT Education - 01/28/22 1844     Education Details wrist splint, wear schedule, manual/scar massage    Person(s) Educated Patient    Methods Explanation;Demonstration;Handout    Comprehension Returned demonstration;Verbalized understanding;Need further instruction                 OT Long Term Goals - 12/25/21 2103       OT LONG TERM GOAL #1   Title Patient to be independent in home program to decrease numbness and pain in the right hand      OT LONG TERM GOAL #2   Title Pt will improve FOTO score to 70 or greater to show a clinically relevant change to impact ADL independence.    Baseline Eval score of 50    Time 6    Period Weeks    Status New    Target Date 02/05/22      OT LONG TERM GOAL #3   Title Right hand wrist and digits AROM  improve to within functional limits with no increase symptoms    Baseline decreased ROM and opposition of right hand    Time 6    Period Weeks    Status New    Target Date 02/05/22      OT LONG TERM GOAL #4   Title Pt to  complete basic self care tasks with modified independence including tying shoes    Baseline difficulty with tying shoes    Time 3    Period Weeks    Status New    Target Date 01/15/22      OT LONG TERM GOAL #5   Title Pt able to demonstrate ability to pick up coins from tabletop with modified independence    Baseline difficulty at eval    Time  6    Period Weeks    Target Date 02/05/22      Long Term Additional Goals   Additional Long Term Goals Yes                   Plan - 01/28/22 1845     Clinical Impression Statement Pt arrived late to appt therefore tx time was shortened.  Pt fitted for right d ring wrist brace to use during heavy work tasks and at night for wrist support as needed.  Pain has decreased overall but still reports numbness in right hand.  Recommended pt continue to perform contrast bath multiple times daily, ROM exercises, use of CICA care for scar, massage and use of splint as needed.  Continue OT to improve right UE functional use in daily tasks.    OT Occupational Profile and History Comprehensive Assessment- Review of records and extensive additional review of physical, cognitive, psychosocial history related to current functional performance    Occupational performance deficits (Please refer to evaluation for details): ADL's;IADL's;Leisure    Body Structure / Function / Physical Skills ADL;Coordination;Scar mobility;UE functional use;Sensation;IADL;Pain;Flexibility;Dexterity;FMC;Strength;Edema;ROM    Psychosocial Skills Habits;Environmental  Adaptations;Routines and Behaviors    Rehab Potential Good    Clinical Decision Making Limited treatment options, no task modification necessary    Comorbidities Affecting Occupational Performance: May have comorbidities impacting occupational performance    Modification or Assistance to Complete Evaluation  No modification of tasks or assist necessary to complete eval    OT Frequency 2x / week    OT Duration 6 weeks     OT Treatment/Interventions Self-care/ADL training;Cryotherapy;Paraffin;Therapeutic exercise;DME and/or AE instruction;Ultrasound;Fluidtherapy;Neuromuscular education;Manual Therapy;Scar mobilization;Splinting;Moist Heat;Contrast Bath;Passive range of motion;Therapeutic activities;Coping strategies training;Patient/family education    Consulted and Agree with Plan of Care Patient             Patient will benefit from skilled therapeutic intervention in order to improve the following deficits and impairments:   Body Structure / Function / Physical Skills: ADL, Coordination, Scar mobility, UE functional use, Sensation, IADL, Pain, Flexibility, Dexterity, FMC, Strength, Edema, ROM   Psychosocial Skills: Habits, Environmental  Adaptations, Routines and Behaviors   Visit Diagnosis: Muscle weakness (generalized)  Other lack of coordination  Pain in right wrist  Pain in right hand  Carpal tunnel syndrome, right upper limb    Problem List Patient Active Problem List   Diagnosis Date Noted   Degenerative joint disease of shoulder region 02/10/2021   Carpal tunnel syndrome of left wrist 02/10/2021   Fibrillation, atrial (HCC) 01/10/2020   Obesity, unspecified 08/18/2017   Shoulder joint pain 07/05/2016   Small Ostium secundum type atrial septal defect with septal aneurysm 03/22/2016   Pain in knee 03/19/2016   Embolism involving retinal artery    Insomnia 02/16/2016   Hollenhorst plaque, right eye 02/16/2016   Broken tooth, complicated 01/23/2016   Hypertension 01/07/2016   Total knee replacement status 01/01/2016   Primary osteoarthritis of right knee 05/26/2015   Benign essential hypertension 04/17/2009   Male erectile disorder (CODE) 12/16/2008   Walter Hodges, Walter Hodges, Walter Hodges  Walter Hodges, OT 01/28/2022, 6:54 PM  [facility] MAIN [facility] SERVICES [address] [place], [place], [address] Phone: [phone]   Fax:  [phone]  Name:  LILBERN SLACUM MRN: [id number] Date of Birth: 01-04-1956

## 2022-02-02 ENCOUNTER — Ambulatory Visit: Payer: Medicare Other

## 2022-02-02 ENCOUNTER — Ambulatory Visit: Payer: Medicare Other | Admitting: Physician Assistant

## 2022-02-03 ENCOUNTER — Ambulatory Visit: Payer: Medicare Other | Admitting: Occupational Therapy

## 2022-02-03 ENCOUNTER — Encounter: Payer: Self-pay | Admitting: Occupational Therapy

## 2022-02-03 DIAGNOSIS — M25531 Pain in right wrist: Secondary | ICD-10-CM

## 2022-02-03 DIAGNOSIS — M6281 Muscle weakness (generalized): Secondary | ICD-10-CM

## 2022-02-03 DIAGNOSIS — R278 Other lack of coordination: Secondary | ICD-10-CM

## 2022-02-03 DIAGNOSIS — M79641 Pain in right hand: Secondary | ICD-10-CM

## 2022-02-03 DIAGNOSIS — G5601 Carpal tunnel syndrome, right upper limb: Secondary | ICD-10-CM

## 2022-02-03 NOTE — Therapy (Addendum)
[facility] MAIN [facility] SERVICES [address] [place], [place], [address] Phone: [phone]   Fax:  [phone]  Occupational Therapy Treatment  Patient Details  Name: Walter Hodges MRN: [id number] Date of Birth: [birth year] Referring Provider (OT): Rosita Kea   Encounter Date: 02/03/2022   OT End of Session - 02/03/22 0911     Visit Number 5    Number of Visits 12    Date for OT Re-Evaluation 02/05/22    OT Start Time 0855    OT Stop Time 0942    OT Time Calculation (min) 47 min    Activity Tolerance Patient tolerated treatment well    Behavior During Therapy [facility] for tasks assessed/performed             Past Medical History:  Diagnosis Date   Arthritis    Bell's palsy 2010   lasted one day ago, told that he had a ministroke   Dysrhythmia    atrial fib   Headache    Hollenhorst plaque, right eye    Hypertension    followed by Dr. Nita Sells" with [facility]      Past Surgical History:  Procedure Laterality Date   CARDIOVERSION N/A 05/29/2020   Procedure: CARDIOVERSION;  Surgeon: Alwyn Pea, MD;  Location: [facility] ORS;  Service: Cardiovascular;  Laterality: N/A;   CIRCUMCISION     done as a child- given inhalation    JOINT REPLACEMENT Bilateral    KNEE CLOSED REDUCTION Left 03/19/2016   Procedure: CLOSED MANIPULATION LEFT KNEE;  Surgeon: Tarry Kos, MD;  [facility]: [facility] OR;  Service: Orthopedics;  Laterality: Left;   LEFT HEART CATH AND CORONARY ANGIOGRAPHY N/A 11/06/2020   Procedure: LEFT HEART CATH AND CORONARY ANGIOGRAPHY;  Surgeon: Alwyn Pea, MD;  Location: [facility] INVASIVE CV LAB;  Service: Cardiovascular;  Laterality: N/A;   TEE WITHOUT CARDIOVERSION N/A 02/19/2016   Procedure: TRANSESOPHAGEAL ECHOCARDIOGRAM (TEE);  Surgeon: Thurmon Fair, MD;  Location: [facility] ENDOSCOPY;  Service: Cardiovascular;  Laterality: N/A;   TOTAL KNEE ARTHROPLASTY Right 05/26/2015   Procedure: RIGHT TOTAL KNEE ARTHROPLASTY;  Surgeon: Tarry Kos, MD;  [facility]: [facility] OR;  Service: Orthopedics;  Laterality: Right;   TOTAL KNEE ARTHROPLASTY Left 01/01/2016   Procedure: LEFT TOTAL KNEE ARTHROPLASTY;  Surgeon: Tarry Kos, MD;  Location: [facility] OR;  Service: Orthopedics;  Laterality: Left;    There were no vitals filed for this visit.   Subjective Assessment - 02/03/22 0908     Subjective  Pt reports wearing the splint on the right hand did help some this week.  Left hand bothering him and reports a trigger finger with ring finger.    Pertinent History Bricyn Labrada is a 66 y.o. male who presents today status post right carpal tunnel release by Dr. Kennedy Bucker on 11/27/2021. Patient having a lot of swelling, burning sensation throughout the fingers. He describes sharp bolts of lightning shooting to the fingertips.  After surgery, pt presented with Right middle and left ring finger with catching and locking with tenderness along the A1 pulley with no warmth, redness. Sensation is intact. Extensor tendons tracking well.  Pt received cortisone injections on 12/04/2021  right middle and a left ring trigger finger    Patient Stated Goals Pt would like to get function back in his hand without pain, perform self care better.    Currently in Pain? Yes    Pain Score 7     Pain Location Hand  tooth, complicated 01/23/2016   Hypertension 01/07/2016   Total knee replacement status 01/01/2016   Primary osteoarthritis of right knee 05/26/2015   Benign essential hypertension 04/17/2009   Male erectile disorder (CODE) 12/16/2008   Walter Hodges, OTR/L, CLT  Jadrian Bulman, OT 02/03/2022, 8:17 PM  [facility] MAIN [facility] SERVICES [address] [place], [place], [address] Phone: [phone]   Fax:  [phone]  Name: Walter Hodges MRN: [id number] Date of Birth: 1956-02-17  [facility] MAIN [facility] SERVICES [address] [place], [place], [address] Phone: [phone]   Fax:  [phone]  Occupational Therapy Treatment  Patient Details  Name: Walter Hodges MRN: [id number] Date of Birth: [birth year] Referring Provider (OT): Rosita Kea   Encounter Date: 02/03/2022   OT End of Session - 02/03/22 0911     Visit Number 5    Number of Visits 12    Date for OT Re-Evaluation 02/05/22    OT Start Time 0855    OT Stop Time 0942    OT Time Calculation (min) 47 min    Activity Tolerance Patient tolerated treatment well    Behavior During Therapy [facility] for tasks assessed/performed             Past Medical History:  Diagnosis Date   Arthritis    Bell's palsy 2010   lasted one day ago, told that he had a ministroke   Dysrhythmia    atrial fib   Headache    Hollenhorst plaque, right eye    Hypertension    followed by Dr. Nita Sells" with [facility].      Past Surgical History:  Procedure Laterality Date   CARDIOVERSION N/A 05/29/2020   Procedure: CARDIOVERSION;  Surgeon: Alwyn Pea, MD;  Location: [facility] ORS;  Service: Cardiovascular;  Laterality: N/A;   CIRCUMCISION     done as a child- given inhalation    JOINT REPLACEMENT Bilateral    KNEE CLOSED REDUCTION Left 03/19/2016   Procedure: CLOSED MANIPULATION LEFT KNEE;  Surgeon: Tarry Kos, MD;  [facility] OR;  Service: Orthopedics;  Laterality: Left;   LEFT HEART CATH AND CORONARY ANGIOGRAPHY N/A 11/06/2020   Procedure: LEFT HEART CATH AND CORONARY ANGIOGRAPHY;  Surgeon: Alwyn Pea, MD;  Location: [facility] INVASIVE CV LAB;  Service: Cardiovascular;  Laterality: N/A;   TEE WITHOUT CARDIOVERSION N/A 02/19/2016   Procedure: TRANSESOPHAGEAL ECHOCARDIOGRAM (TEE);  Surgeon: Thurmon Fair, MD;  Location: [facility] ENDOSCOPY;  Service: Cardiovascular;  Laterality: N/A;   TOTAL KNEE ARTHROPLASTY Right 05/26/2015   Procedure: RIGHT TOTAL KNEE ARTHROPLASTY;  Surgeon: Tarry Kos, MD;  [facility] OR;  Service: Orthopedics;  Laterality: Right;   TOTAL KNEE ARTHROPLASTY Left 01/01/2016   Procedure: LEFT TOTAL KNEE ARTHROPLASTY;  Surgeon: Tarry Kos, MD;  Location: [facility] OR;  Service: Orthopedics;  Laterality: Left;    There were no vitals filed for this visit.   Subjective Assessment - 02/03/22 0908     Subjective  Pt reports wearing the splint on the right hand did help some this week.  Left hand bothering him and reports a trigger finger with ring finger.    Pertinent History Bricyn Labrada is a 66 y.o. male who presents today status post right carpal tunnel release by Dr. Kennedy Bucker on 11/27/2021. Patient having a lot of swelling, burning sensation throughout the fingers. He describes sharp bolts of lightning shooting to the fingertips.  After surgery, pt presented with Right middle and left ring finger with catching and locking with tenderness along the A1 pulley with no warmth, redness. Sensation is intact. Extensor tendons tracking well.  Pt received cortisone injections on 12/04/2021  right middle and a left ring trigger finger    Patient Stated Goals Pt would like to get function back in his hand without pain, perform self care better.    Currently in Pain? Yes    Pain Score 7     Pain Location Hand  [facility] MAIN [facility] SERVICES [address] [place], [place], [address] Phone: [phone]   Fax:  [phone]  Occupational Therapy Treatment  Patient Details  Name: Walter Hodges MRN: [id number] Date of Birth: [birth year] Referring Provider (OT): Rosita Kea   Encounter Date: 02/03/2022   OT End of Session - 02/03/22 0911     Visit Number 5    Number of Visits 12    Date for OT Re-Evaluation 02/05/22    OT Start Time 0855    OT Stop Time 0942    OT Time Calculation (min) 47 min    Activity Tolerance Patient tolerated treatment well    Behavior During Therapy [facility] for tasks assessed/performed             Past Medical History:  Diagnosis Date   Arthritis    Bell's palsy 2010   lasted one day ago, told that he had a ministroke   Dysrhythmia    atrial fib   Headache    Hollenhorst plaque, right eye    Hypertension    followed by Dr. Nita Sells" with [facility].      Past Surgical History:  Procedure Laterality Date   CARDIOVERSION N/A 05/29/2020   Procedure: CARDIOVERSION;  Surgeon: Alwyn Pea, MD;  Location: [facility] ORS;  Service: Cardiovascular;  Laterality: N/A;   CIRCUMCISION     done as a child- given inhalation    JOINT REPLACEMENT Bilateral    KNEE CLOSED REDUCTION Left 03/19/2016   Procedure: CLOSED MANIPULATION LEFT KNEE;  Surgeon: Tarry Kos, MD;  Location: [facility] OR;  Service: Orthopedics;  Laterality: Left;   LEFT HEART CATH AND CORONARY ANGIOGRAPHY N/A 11/06/2020   Procedure: LEFT HEART CATH AND CORONARY ANGIOGRAPHY;  Surgeon: Alwyn Pea, MD;  Location: [facility] INVASIVE CV LAB;  Service: Cardiovascular;  Laterality: N/A;   TEE WITHOUT CARDIOVERSION N/A 02/19/2016   Procedure: TRANSESOPHAGEAL ECHOCARDIOGRAM (TEE);  Surgeon: Thurmon Fair, MD;  Location: [facility] ENDOSCOPY;  Service: Cardiovascular;  Laterality: N/A;   TOTAL KNEE ARTHROPLASTY Right 05/26/2015   Procedure: RIGHT TOTAL KNEE ARTHROPLASTY;  Surgeon: Tarry Kos, MD;  [facility] OR;  Service: Orthopedics;  Laterality: Right;   TOTAL KNEE ARTHROPLASTY Left 01/01/2016   Procedure: LEFT TOTAL KNEE ARTHROPLASTY;  Surgeon: Tarry Kos, MD;  Location: [facility] OR;  Service: Orthopedics;  Laterality: Left;    There were no vitals filed for this visit.   Subjective Assessment - 02/03/22 0908     Subjective  Pt reports wearing the splint on the right hand did help some this week.  Left hand bothering him and reports a trigger finger with ring finger.    Pertinent History Bricyn Labrada is a 66 y.o. male who presents today status post right carpal tunnel release by Dr. Kennedy Bucker on 11/27/2021. Patient having a lot of swelling, burning sensation throughout the fingers. He describes sharp bolts of lightning shooting to the fingertips.  After surgery, pt presented with Right middle and left ring finger with catching and locking with tenderness along the A1 pulley with no warmth, redness. Sensation is intact. Extensor tendons tracking well.  Pt received cortisone injections on 12/04/2021  right middle and a left ring trigger finger    Patient Stated Goals Pt would like to get function back in his hand without pain, perform self care better.    Currently in Pain? Yes    Pain Score 7     Pain Location Hand

## 2022-02-04 ENCOUNTER — Ambulatory Visit: Payer: Medicare Other

## 2022-02-08 ENCOUNTER — Ambulatory Visit: Payer: Medicare Other | Attending: Orthopedic Surgery | Admitting: Occupational Therapy

## 2022-02-08 DIAGNOSIS — R278 Other lack of coordination: Secondary | ICD-10-CM | POA: Diagnosis present

## 2022-02-08 DIAGNOSIS — G5601 Carpal tunnel syndrome, right upper limb: Secondary | ICD-10-CM | POA: Diagnosis present

## 2022-02-08 DIAGNOSIS — M25642 Stiffness of left hand, not elsewhere classified: Secondary | ICD-10-CM

## 2022-02-08 DIAGNOSIS — M25531 Pain in right wrist: Secondary | ICD-10-CM | POA: Diagnosis present

## 2022-02-08 DIAGNOSIS — M6281 Muscle weakness (generalized): Secondary | ICD-10-CM

## 2022-02-08 DIAGNOSIS — M79642 Pain in left hand: Secondary | ICD-10-CM | POA: Diagnosis present

## 2022-02-08 DIAGNOSIS — M79641 Pain in right hand: Secondary | ICD-10-CM | POA: Diagnosis present

## 2022-02-09 ENCOUNTER — Ambulatory Visit (INDEPENDENT_AMBULATORY_CARE_PROVIDER_SITE_OTHER): Payer: Medicare Other | Admitting: Physician Assistant

## 2022-02-09 DIAGNOSIS — E291 Testicular hypofunction: Secondary | ICD-10-CM

## 2022-02-09 MED ORDER — TESTOSTERONE CYPIONATE 200 MG/ML IM SOLN: 200.0000 mg | Freq: Once | INTRAMUSCULAR | Status: DC

## 2022-02-09 NOTE — Progress Notes (Signed)
Testosterone IM Injection   Due to Hypogonadism patient is present today for a Testosterone Injection.   Medication: Testosterone Cypionate Dose: 100mg /0.64mL Location: right upper outer buttocks Lot: 4m Exp:04/2023   Patient tolerated well, no complications were noted.   Performed by: 05/2023 CMA   Follow up: RTC in 2 weeks for testosterone injection

## 2022-02-10 ENCOUNTER — Encounter: Payer: Medicare Other | Admitting: Occupational Therapy

## 2022-02-11 ENCOUNTER — Ambulatory Visit: Payer: Medicare Other | Admitting: Occupational Therapy

## 2022-02-11 DIAGNOSIS — M25642 Stiffness of left hand, not elsewhere classified: Secondary | ICD-10-CM

## 2022-02-11 DIAGNOSIS — M79642 Pain in left hand: Secondary | ICD-10-CM

## 2022-02-11 DIAGNOSIS — M79641 Pain in right hand: Secondary | ICD-10-CM

## 2022-02-11 DIAGNOSIS — M6281 Muscle weakness (generalized): Secondary | ICD-10-CM

## 2022-02-11 DIAGNOSIS — M25531 Pain in right wrist: Secondary | ICD-10-CM

## 2022-02-11 DIAGNOSIS — R278 Other lack of coordination: Secondary | ICD-10-CM

## 2022-02-11 DIAGNOSIS — G5601 Carpal tunnel syndrome, right upper limb: Secondary | ICD-10-CM

## 2022-02-16 ENCOUNTER — Ambulatory Visit: Payer: Medicare Other | Admitting: Occupational Therapy

## 2022-02-16 DIAGNOSIS — M79641 Pain in right hand: Secondary | ICD-10-CM

## 2022-02-16 DIAGNOSIS — M6281 Muscle weakness (generalized): Secondary | ICD-10-CM

## 2022-02-16 DIAGNOSIS — M25531 Pain in right wrist: Secondary | ICD-10-CM

## 2022-02-16 DIAGNOSIS — G5601 Carpal tunnel syndrome, right upper limb: Secondary | ICD-10-CM

## 2022-02-16 DIAGNOSIS — R278 Other lack of coordination: Secondary | ICD-10-CM

## 2022-02-16 DIAGNOSIS — M25642 Stiffness of left hand, not elsewhere classified: Secondary | ICD-10-CM

## 2022-02-16 DIAGNOSIS — M79642 Pain in left hand: Secondary | ICD-10-CM

## 2022-02-18 ENCOUNTER — Encounter: Payer: Self-pay | Admitting: Occupational Therapy

## 2022-02-18 ENCOUNTER — Ambulatory Visit: Payer: Medicare Other | Admitting: Occupational Therapy

## 2022-02-18 DIAGNOSIS — M79641 Pain in right hand: Secondary | ICD-10-CM

## 2022-02-18 DIAGNOSIS — G5601 Carpal tunnel syndrome, right upper limb: Secondary | ICD-10-CM

## 2022-02-18 DIAGNOSIS — M79642 Pain in left hand: Secondary | ICD-10-CM

## 2022-02-18 DIAGNOSIS — M6281 Muscle weakness (generalized): Secondary | ICD-10-CM

## 2022-02-18 DIAGNOSIS — R278 Other lack of coordination: Secondary | ICD-10-CM

## 2022-02-18 DIAGNOSIS — M25531 Pain in right wrist: Secondary | ICD-10-CM

## 2022-02-18 DIAGNOSIS — M25642 Stiffness of left hand, not elsewhere classified: Secondary | ICD-10-CM

## 2022-02-18 NOTE — Therapy (Signed)
[facility] MAIN [facility] SERVICES [address] [place], [place], [address] Phone: [phone]   Fax:  [phone]  Occupational Therapy Treatment   Patient Details  Name: Walter Hodges MRN: [id number] Date of Birth: 1956/07/02 Referring Provider (OT): Rosita Kea   Encounter Date: 02/18/2022   OT End of Session - 02/19/22 1143     Visit Number 9    Number of Visits 18    Date for OT Re-Evaluation 03/22/22    OT Start Time 0847    OT Stop Time 0930    OT Time Calculation (min) 43 min    Activity Tolerance Patient tolerated treatment well    Behavior During Therapy [facility] for tasks assessed/performed              Past Medical History:  Diagnosis Date   Arthritis    Bell's palsy 2010   lasted one day ago, told that he had a ministroke   Dysrhythmia    atrial fib   Headache    Hollenhorst plaque, right eye    Hypertension    followed by Dr. Nita Sells" with [facility].      Past Surgical History:  Procedure Laterality Date   CARDIOVERSION N/A 05/29/2020   Procedure: CARDIOVERSION;  Surgeon: Alwyn Pea, MD;  Location: [facility] ORS;  Service: Cardiovascular;  Laterality: N/A;   CIRCUMCISION     done as a child- given inhalation    JOINT REPLACEMENT Bilateral    KNEE CLOSED REDUCTION Left 03/19/2016   Procedure: CLOSED MANIPULATION LEFT KNEE;  Surgeon: Tarry Kos, MD;  Location: [facility] OR;  Service: Orthopedics;  Laterality: Left;   LEFT HEART CATH AND CORONARY ANGIOGRAPHY N/A 11/06/2020   Procedure: LEFT HEART CATH AND CORONARY ANGIOGRAPHY;  Surgeon: Alwyn Pea, MD;  Location: [facility] INVASIVE CV LAB;  Service: Cardiovascular;  Laterality: N/A;   TEE WITHOUT CARDIOVERSION N/A 02/19/2016   Procedure: TRANSESOPHAGEAL ECHOCARDIOGRAM (TEE);  Surgeon: Thurmon Fair, MD;  Location: [facility] ENDOSCOPY;  Service: Cardiovascular;  Laterality: N/A;   TOTAL KNEE ARTHROPLASTY Right 05/26/2015   Procedure: RIGHT TOTAL KNEE ARTHROPLASTY;  Surgeon: Tarry Kos, MD;  Location: [facility] OR;  Service: Orthopedics;  Laterality: Right;   TOTAL KNEE ARTHROPLASTY Left 01/01/2016   Procedure: LEFT TOTAL KNEE ARTHROPLASTY;  Surgeon: Tarry Kos, MD;  Location: [facility] OR;  Service: Orthopedics;  Laterality: Left;    There were no vitals filed for this visit.   Subjective Assessment - 02/19/22 1141     Subjective  Pt reports his right hand is doing well, needs a new wrist brace since the one he has the seams are coming apart.    Pertinent History Walter Hodges is a 66 y.o. male who presents today status post right carpal tunnel release by Dr. Kennedy Bucker on 11/27/2021. Patient having a lot of swelling, burning sensation throughout the fingers. He describes sharp bolts of lightning shooting to the fingertips.  After surgery, pt presented with Right middle and left ring finger with catching and locking with tenderness along the A1 pulley with no warmth, redness. Sensation is intact. Extensor tendons tracking well.  Pt received cortisone injections on 12/04/2021  right middle and a left ring trigger finger    Patient Stated Goals Pt would like to get function back in his hand without pain, perform self care better.    Currently in Pain? Yes    Pain Score 4     Pain Location Hand    Pain Orientation  Left    Pain Descriptors / Indicators Aching    Pain Onset More than a month ago    Pain Frequency Intermittent              Paraffin Pt seen for use of paraffin to right and left hand and wrist to decrease pain, increase tissue mobility to improve ROM.   Skin inspected prior to and following paraffin with no issues or irritation.  Manual:  Following paraffin, pt seen for manual therapy with soft tissue massage to bilateral hands, carpal and metacarpal spreads to decrease edema and improve lymphatic flow to the area.  Scar massage to surgical scar on the right wrist.  Use of Gua Sha tool bilaterally to volar and dorsal surface of forearm to decrease  adhesions and improve ROM to distal hand and digits.     Therapeutic Exercises: 1# dumbell for wrist flexion/extension, supination/pronation, UD/RD for 2 sets of 10 reps each Tendon glides to right UE Oppositional movements of thumb to index and middle finger bilaterally.  Unable to achieve to ring and small fingers.   Left hand MP flexion with PIP extension  Issued new d ring wrist brace for R wrist since the one he had is coming apart.  Advised pt to start weaning off brace more and just to use when doing heavier work or repetitive tasks such as unpacking boxes of clothing at work.    Response to tx: Pt has continued to make great progress with right hand, left hand is more problematic lately with trigger finger.  He demonstrates understanding of exercises, use of contrast bilaterally and has a blocking ring on the left to decrease incidence of trigger finger.  Will continue to monitor and address symptoms with therapeutic intervention.  Will reassess next session for progress update.                   OT Education - 02/19/22 1143     Education Details use of contrast, paraffin, exercises, new wrist brace    Person(s) Educated Patient    Methods Explanation;Demonstration;Handout    Comprehension Returned demonstration;Verbalized understanding;Need further instruction                 OT Long Term Goals - 02/18/22 1606       OT LONG TERM GOAL #1   Title Patient to be independent with splint for left trigger finger to decrease pain and inflammation.    Baseline no current splint    Time 3    Period Weeks    Status New    Target Date 03/01/22      OT LONG TERM GOAL #2   Title Pt will improve FOTO score to 70 or greater to show a clinically relevant change to impact ADL independence.    Baseline Eval score of 50    Time 6    Period Weeks    Status On-going    Target Date 03/22/22      OT LONG TERM GOAL #3   Title Right hand wrist and digits AROM  improve  to within functional limits with no increase symptoms    Baseline decreased ROM and opposition of right hand    Time 6    Period Weeks    Status On-going    Target Date 03/22/22      OT LONG TERM GOAL #4   Title Pt to complete basic self care tasks with modified independence including tying shoes    Baseline difficulty  with tying shoes    Time 3    Period Weeks    Status On-going    Target Date 03/22/22      OT LONG TERM GOAL #5   Title Pt able to demonstrate ability to pick up coins from tabletop with modified independence    Baseline difficulty at eval    Time 6    Period Weeks    Status On-going    Target Date 03/22/22      Long Term Additional Goals   Additional Long Term Goals Yes      OT LONG TERM GOAL #6   Title Pt to decrease pain in left ring finger to 2/10 or less with activity.    Baseline 8/10 pain this date    Time 6    Period Weeks    Status New    Target Date 03/22/22      OT LONG TERM GOAL #7   Title Pt to demonstrate understanding of modifications to activity to avoid tight gripping and exacerbation of symptoms of left trigger finger.    Baseline decreased knowledge of trigger finger    Time 6    Period Weeks    Target Date 03/22/22                   Plan - 02/19/22 1143     Clinical Impression Statement Pt has continued to make great progress with right hand, left hand is more problematic lately with trigger finger.  He demonstrates understanding of exercises, use of contrast bilaterally and has a blocking ring on the left to decrease incidence of trigger finger.  Will continue to monitor and address symptoms with therapeutic intervention.  Will reassess next session for progress update.    OT Occupational Profile and History Comprehensive Assessment- Review of records and extensive additional review of physical, cognitive, psychosocial history related to current functional performance    Occupational performance deficits (Please refer to  evaluation for details): ADL's;IADL's;Leisure    Body Structure / Function / Physical Skills ADL;Coordination;Scar mobility;UE functional use;Sensation;IADL;Pain;Flexibility;Dexterity;FMC;Strength;Edema;ROM    Psychosocial Skills Habits;Environmental  Adaptations;Routines and Behaviors    Rehab Potential Good    Clinical Decision Making Limited treatment options, no task modification necessary    Comorbidities Affecting Occupational Performance: May have comorbidities impacting occupational performance    Modification or Assistance to Complete Evaluation  No modification of tasks or assist necessary to complete eval    OT Frequency 2x / week    OT Duration 6 weeks    OT Treatment/Interventions Self-care/ADL training;Cryotherapy;Paraffin;Therapeutic exercise;DME and/or AE instruction;Ultrasound;Fluidtherapy;Neuromuscular education;Manual Therapy;Scar mobilization;Splinting;Moist Heat;Contrast Bath;Passive range of motion;Therapeutic activities;Coping strategies training;Patient/family education    Consulted and Agree with Plan of Care Patient             Patient will benefit from skilled therapeutic intervention in order to improve the following deficits and impairments:   Body Structure / Function / Physical Skills: ADL, Coordination, Scar mobility, UE functional use, Sensation, IADL, Pain, Flexibility, Dexterity, FMC, Strength, Edema, ROM   Psychosocial Skills: Habits, Environmental  Adaptations, Routines and Behaviors   Visit Diagnosis: Muscle weakness (generalized)  Other lack of coordination  Pain in right wrist  Pain in right hand  Carpal tunnel syndrome, right upper limb  Pain in left hand  Stiffness of finger joint of left hand    Problem List Patient Active Problem List   Diagnosis Date Noted   Degenerative joint disease of shoulder region 02/10/2021   Carpal tunnel syndrome  of left wrist 02/10/2021   Fibrillation, atrial (HCC) 01/10/2020   Obesity, unspecified  08/18/2017   Shoulder joint pain 07/05/2016   Small Ostium secundum type atrial septal defect with septal aneurysm 03/22/2016   Pain in knee 03/19/2016   Embolism involving retinal artery    Insomnia 02/16/2016   Hollenhorst plaque, right eye 02/16/2016   Broken tooth, complicated 01/23/2016   Hypertension 01/07/2016   Total knee replacement status 01/01/2016   Primary osteoarthritis of right knee 05/26/2015   Benign essential hypertension 04/17/2009   Male erectile disorder (CODE) 12/16/2008   Raelin Pixler Cornelius Moras, OTR/L, CLT  Jacci Ruberg, OT 02/19/2022, 11:52 AM  [facility] MAIN [facility] SERVICES [address] [place], [place], [address] Phone: [phone]   Fax:  [phone]  Name: Walter Hodges MRN: [id number] Date of Birth: Jan 27, 1956

## 2022-02-18 NOTE — Therapy (Addendum)
[facility] MAIN [facility] SERVICES [address] [place], [place], [address] Phone: [phone]   Fax:  [phone]  Occupational Therapy Treatment/Recertification  Patient Details  Name: Walter Hodges MRN: [id number] Date of Birth: 31-Mar-1956 Referring Provider (OT): Rosita Kea   Encounter Date: 02/08/2022   OT End of Session - 02/18/22 1338     Visit Number 6    Number of Visits 12    Date for OT Re-Evaluation 02/05/22    OT Start Time 0857    OT Stop Time 0930    OT Time Calculation (min) 33 min    Activity Tolerance Patient tolerated treatment well    Behavior During Therapy [facility] for tasks assessed/performed             Past Medical History:  Diagnosis Date   Arthritis    Bell's palsy 2010   lasted one day ago, told that he had a ministroke   Dysrhythmia    atrial fib   Headache    Hollenhorst plaque, right eye    Hypertension    followed by Dr. Nita Sells" with [facility]      Past Surgical History:  Procedure Laterality Date   CARDIOVERSION N/A 05/29/2020   Procedure: CARDIOVERSION;  Surgeon: Alwyn Pea, MD;  Location: [facility] ORS;  Service: Cardiovascular;  Laterality: N/A;   CIRCUMCISION     done as a child- given inhalation    JOINT REPLACEMENT Bilateral    KNEE CLOSED REDUCTION Left 03/19/2016   Procedure: CLOSED MANIPULATION LEFT KNEE;  Surgeon: Tarry Kos, MD;  Location: [facility] OR;  Service: Orthopedics;  Laterality: Left;   LEFT HEART CATH AND CORONARY ANGIOGRAPHY N/A 11/06/2020   Procedure: LEFT HEART CATH AND CORONARY ANGIOGRAPHY;  Surgeon: Alwyn Pea, MD;  Location: [facility] INVASIVE CV LAB;  Service: Cardiovascular;  Laterality: N/A;   TEE WITHOUT CARDIOVERSION N/A 02/19/2016   Procedure: TRANSESOPHAGEAL ECHOCARDIOGRAM (TEE);  Surgeon: Thurmon Fair, MD;  Location: [facility] ENDOSCOPY;  Service: Cardiovascular;  Laterality: N/A;   TOTAL KNEE ARTHROPLASTY Right 05/26/2015   Procedure: RIGHT TOTAL KNEE ARTHROPLASTY;   Surgeon: Tarry Kos, MD;  Location: [facility] OR;  Service: Orthopedics;  Laterality: Right;   TOTAL KNEE ARTHROPLASTY Left 01/01/2016   Procedure: LEFT TOTAL KNEE ARTHROPLASTY;  Surgeon: Tarry Kos, MD;  Location: [facility] OR;  Service: Orthopedics;  Laterality: Left;    There were no vitals filed for this visit.   Subjective Assessment - 02/18/22 1320     Subjective  Pt reports 8/10 pain in left hand with trigger finger. His back has also been hurting today.    Pertinent History Walter Hodges is a 66 y.o. male who presents today status post right carpal tunnel release by Dr. Kennedy Bucker on 11/27/2021. Patient having a lot of swelling, burning sensation throughout the fingers. He describes sharp bolts of lightning shooting to the fingertips.  After surgery, pt presented with Right middle and left ring finger with catching and locking with tenderness along the A1 pulley with no warmth, redness. Sensation is intact. Extensor tendons tracking well.  Pt received cortisone injections on 12/04/2021  right middle and a left ring trigger finger    Patient Stated Goals Pt would like to get function back in his hand without pain, perform self care better.    Currently in Pain? Yes    Pain Score 8     Pain Location Hand    Pain Orientation Left    Pain Type Acute pain  [facility] MAIN [facility] SERVICES [address] [place], [place], [address] Phone: [phone]   Fax:  [phone]  Occupational Therapy Treatment/Recertification  Patient Details  Name: Walter Hodges MRN: [id number] Date of Birth: 31-Mar-1956 Referring Provider (OT): Rosita Kea   Encounter Date: 02/08/2022   OT End of Session - 02/18/22 1338     Visit Number 6    Number of Visits 12    Date for OT Re-Evaluation 02/05/22    OT Start Time 0857    OT Stop Time 0930    OT Time Calculation (min) 33 min    Activity Tolerance Patient tolerated treatment well    Behavior During Therapy [facility] for tasks assessed/performed             Past Medical History:  Diagnosis Date   Arthritis    Bell's palsy 2010   lasted one day ago, told that he had a ministroke   Dysrhythmia    atrial fib   Headache    Hollenhorst plaque, right eye    Hypertension    followed by Dr. Nita Sells" with [facility].      Past Surgical History:  Procedure Laterality Date   CARDIOVERSION N/A 05/29/2020   Procedure: CARDIOVERSION;  Surgeon: Alwyn Pea, MD;  Location: [facility] ORS;  Service: Cardiovascular;  Laterality: N/A;   CIRCUMCISION     done as a child- given inhalation    JOINT REPLACEMENT Bilateral    KNEE CLOSED REDUCTION Left 03/19/2016   Procedure: CLOSED MANIPULATION LEFT KNEE;  Surgeon: Tarry Kos, MD;  [facility]: [facility] OR;  Service: Orthopedics;  Laterality: Left;   LEFT HEART CATH AND CORONARY ANGIOGRAPHY N/A 11/06/2020   Procedure: LEFT HEART CATH AND CORONARY ANGIOGRAPHY;  Surgeon: Alwyn Pea, MD;  Location: [facility] INVASIVE CV LAB;  Service: Cardiovascular;  Laterality: N/A;   TEE WITHOUT CARDIOVERSION N/A 02/19/2016   Procedure: TRANSESOPHAGEAL ECHOCARDIOGRAM (TEE);  Surgeon: Thurmon Fair, MD;  Location: [facility] ENDOSCOPY;  Service: Cardiovascular;  Laterality: N/A;   TOTAL KNEE ARTHROPLASTY Right 05/26/2015   Procedure: RIGHT TOTAL KNEE ARTHROPLASTY;   Surgeon: Tarry Kos, MD;  [facility] OR;  Service: Orthopedics;  Laterality: Right;   TOTAL KNEE ARTHROPLASTY Left 01/01/2016   Procedure: LEFT TOTAL KNEE ARTHROPLASTY;  Surgeon: Tarry Kos, MD;  [facility]: [facility] OR;  Service: Orthopedics;  Laterality: Left;    There were no vitals filed for this visit.   Subjective Assessment - 02/18/22 1320     Subjective  Pt reports 8/10 pain in left hand with trigger finger. His back has also been hurting today.    Pertinent History Walter Hodges is a 66 y.o. male who presents today status post right carpal tunnel release by Dr. Kennedy Bucker on 11/27/2021. Patient having a lot of swelling, burning sensation throughout the fingers. He describes sharp bolts of lightning shooting to the fingertips.  After surgery, pt presented with Right middle and left ring finger with catching and locking with tenderness along the A1 pulley with no warmth, redness. Sensation is intact. Extensor tendons tracking well.  Pt received cortisone injections on 12/04/2021  right middle and a left ring trigger finger    Patient Stated Goals Pt would like to get function back in his hand without pain, perform self care better.    Currently in Pain? Yes    Pain Score 8     Pain Location Hand    Pain Orientation Left    Pain Type Acute pain  range of motion;Therapeutic activities;Coping strategies training;Patient/family education    Consulted and Agree with Plan of Care Patient             Patient will benefit from skilled therapeutic intervention in order to improve the  following deficits and impairments:   Body Structure / Function / Physical Skills: ADL, Coordination, Scar mobility, UE functional use, Sensation, IADL, Pain, Flexibility, Dexterity, FMC, Strength, Edema, ROM   Psychosocial Skills: Habits, Environmental  Adaptations, Routines and Behaviors   Visit Diagnosis: Muscle weakness (generalized) - Plan: Ot plan of care cert/re-cert  Other lack of coordination - Plan: Ot plan of care cert/re-cert  Pain in right wrist - Plan: Ot plan of care cert/re-cert  Pain in right hand - Plan: Ot plan of care cert/re-cert  Carpal tunnel syndrome, right upper limb - Plan: Ot plan of care cert/re-cert  Pain in left hand - Plan: Ot plan of care cert/re-cert  Stiffness of left hand, not elsewhere classified - Plan: Ot plan of care cert/re-cert    Problem List Patient Active Problem List   Diagnosis Date Noted   Degenerative joint disease of shoulder region 02/10/2021   Carpal tunnel syndrome of left wrist 02/10/2021   Fibrillation, atrial (HCC) 01/10/2020   Obesity, unspecified 08/18/2017   Shoulder joint pain 07/05/2016   Small Ostium secundum type atrial septal defect with septal aneurysm 03/22/2016   Pain in knee 03/19/2016   Embolism involving retinal artery    Insomnia 02/16/2016   Hollenhorst plaque, right eye 02/16/2016   Broken tooth, complicated 01/23/2016   Hypertension 01/07/2016   Total knee replacement status 01/01/2016   Primary osteoarthritis of right knee 05/26/2015   Benign essential hypertension 04/17/2009   Male erectile disorder (CODE) 12/16/2008   Quante Pettry Cornelius Moras, OTR/L, CLT  Remmy Riffe, OT 02/18/2022, 4:30 PM  [facility] MAIN [facility] SERVICES [address] [place], [place], [address] Phone: [phone]   Fax:  [phone]  Name: Walter Hodges MRN: [id number] Date of Birth: 03-20-1956  range of motion;Therapeutic activities;Coping strategies training;Patient/family education    Consulted and Agree with Plan of Care Patient             Patient will benefit from skilled therapeutic intervention in order to improve the  following deficits and impairments:   Body Structure / Function / Physical Skills: ADL, Coordination, Scar mobility, UE functional use, Sensation, IADL, Pain, Flexibility, Dexterity, FMC, Strength, Edema, ROM   Psychosocial Skills: Habits, Environmental  Adaptations, Routines and Behaviors   Visit Diagnosis: Muscle weakness (generalized) - Plan: Ot plan of care cert/re-cert  Other lack of coordination - Plan: Ot plan of care cert/re-cert  Pain in right wrist - Plan: Ot plan of care cert/re-cert  Pain in right hand - Plan: Ot plan of care cert/re-cert  Carpal tunnel syndrome, right upper limb - Plan: Ot plan of care cert/re-cert  Pain in left hand - Plan: Ot plan of care cert/re-cert  Stiffness of left hand, not elsewhere classified - Plan: Ot plan of care cert/re-cert    Problem List Patient Active Problem List   Diagnosis Date Noted   Degenerative joint disease of shoulder region 02/10/2021   Carpal tunnel syndrome of left wrist 02/10/2021   Fibrillation, atrial (HCC) 01/10/2020   Obesity, unspecified 08/18/2017   Shoulder joint pain 07/05/2016   Small Ostium secundum type atrial septal defect with septal aneurysm 03/22/2016   Pain in knee 03/19/2016   Embolism involving retinal artery    Insomnia 02/16/2016   Hollenhorst plaque, right eye 02/16/2016   Broken tooth, complicated 01/23/2016   Hypertension 01/07/2016   Total knee replacement status 01/01/2016   Primary osteoarthritis of right knee 05/26/2015   Benign essential hypertension 04/17/2009   Male erectile disorder (CODE) 12/16/2008   Quante Pettry Cornelius Moras, OTR/L, CLT  Remmy Riffe, OT 02/18/2022, 4:30 PM  [facility] MAIN [facility] SERVICES [address] [place], [place], [address] Phone: [phone]   Fax:  [phone]  Name: Walter Hodges MRN: [id number] Date of Birth: 03-20-1956

## 2022-02-18 NOTE — Therapy (Signed)
[facility] [address] [place], [address] Phone: [phone]   Fax:  [phone]  Occupational Therapy Treatment  Patient Details  Name: Walter Hodges MRN: [id number] Date of Birth: 1956/02/15 Referring Provider (OT): Rosita Kea   Encounter Date: 02/11/2022   OT End of Session - 02/18/22 1633     Visit Number 7    Number of Visits 18    Date for OT Re-Evaluation 03/22/22    OT Start Time 0845    OT Stop Time 0923    OT Time Calculation (min) 38 min    Activity Tolerance Patient tolerated treatment well    Behavior During Therapy [facility] for tasks assessed/performed             Past Medical History:  Diagnosis Date   Arthritis    Bell's palsy 2010   lasted one day ago, told that he had a ministroke   Dysrhythmia    atrial fib   Headache    Hollenhorst plaque, right eye    Hypertension    followed by Dr. Nita Sells" with [facility].      Past Surgical History:  Procedure Laterality Date   CARDIOVERSION N/A 05/29/2020   Procedure: CARDIOVERSION;  Surgeon: Alwyn Pea, MD;  Location: [facility] ORS;  Service: Cardiovascular;  Laterality: N/A;   CIRCUMCISION     done as a child- given inhalation    JOINT REPLACEMENT Bilateral    KNEE CLOSED REDUCTION Left 03/19/2016   Procedure: CLOSED MANIPULATION LEFT KNEE;  Surgeon: Tarry Kos, MD;  Location: [facility] OR;  Service: Orthopedics;  Laterality: Left;   LEFT HEART CATH AND CORONARY ANGIOGRAPHY N/A 11/06/2020   Procedure: LEFT HEART CATH AND CORONARY ANGIOGRAPHY;  Surgeon: Alwyn Pea, MD;  Location: [facility] INVASIVE CV LAB;  Service: Cardiovascular;  Laterality: N/A;   TEE WITHOUT CARDIOVERSION N/A 02/19/2016   Procedure: TRANSESOPHAGEAL ECHOCARDIOGRAM (TEE);  Surgeon: Thurmon Fair, MD;  Location: [facility] ENDOSCOPY;  Service: Cardiovascular;  Laterality: N/A;   TOTAL KNEE ARTHROPLASTY Right 05/26/2015   Procedure: RIGHT TOTAL KNEE ARTHROPLASTY;  Surgeon:  Tarry Kos, MD;  Location: [facility] OR;  Service: Orthopedics;  Laterality: Right;   TOTAL KNEE ARTHROPLASTY Left 01/01/2016   Procedure: LEFT TOTAL KNEE ARTHROPLASTY;  Surgeon: Tarry Kos, MD;  Location: [facility] OR;  Service: Orthopedics;  Laterality: Left;    There were no vitals filed for this visit.   Subjective Assessment - 02/18/22 1632     Subjective  Pt reports he is doing better.    Pertinent History Walter Hodges is a 66 y.o. male who presents today status post right carpal tunnel release by Dr. Kennedy Bucker on 11/27/2021. Patient having a lot of swelling, burning sensation throughout the fingers. He describes sharp bolts of lightning shooting to the fingertips.  After surgery, pt presented with Right middle and left ring finger with catching and locking with tenderness along the A1 pulley with no warmth, redness. Sensation is intact. Extensor tendons tracking well.  Pt received cortisone injections on 12/04/2021  right middle and a left ring trigger finger    Patient Stated Goals Pt would like to get function back in his hand without pain, perform self care better.    Currently in Pain? Yes    Pain Score 4     Pain Location Hand    Pain Orientation Left    Pain Descriptors / Indicators Aching    Pain Type Acute pain  Pain Onset More than a month ago    Pain Frequency Intermittent           Rationale for Evaluation and Treatment Rehabilitation   Use of Fluidotherapy to bilateral UEs this date to decrease pain, decrease inflammation and increase tissue mobility in preparation for therapeutic exercises.  Manual therapy performed after fluidotherapy for scar massage to wrist, use of graston tool Nr1 to volar and dorsal forearm with sweeping motion to decrease fascial restrictions, stimulate blood flow to area prior to therapeutic exercises.   Therapeutic Exercises:   ROM measurement of right wrist, 60 degrees of wrist flexion, 48 extension  Wrist exercises for wrist flexion,  extension, UD, RD for 2 sets 10 reps each, no weight this date.   Opposition to middle finger right  Left to lateral side of middle finger  Tendon gliding exercises on right, median nerve glides Cues for proper form and technique with exercises  Pt fitted for finger orthotic for left ring finger to block flexion to decrease triggering with daily tasks.    Response to tx: Pt continues to respond well to therapeutic intervention, decreased pain in right hand, improved motion and improved FOTO score.  Recent resurfacing of pain in left for trigger finger but responding well to conservative efforts and pain decreased from 8 to 4 today.  Continue to work towards goals in plan of care to decrease pain, increase motion, restore strength and functional use for daily tasks.                        OT Education - 02/18/22 1633     Education Details use of contrast, paraffin, exercises    Person(s) Educated Patient    Methods Explanation;Demonstration;Handout    Comprehension Returned demonstration;Verbalized understanding;Need further instruction                 OT Long Term Goals - 02/18/22 1606       OT LONG TERM GOAL #1   Title Patient to be independent with splint for left trigger finger to decrease pain and inflammation.    Baseline no current splint    Time 3    Period Weeks    Status New    Target Date 03/01/22      OT LONG TERM GOAL #2   Title Pt will improve FOTO score to 70 or greater to show a clinically relevant change to impact ADL independence.    Baseline Eval score of 50    Time 6    Period Weeks    Status On-going    Target Date 03/22/22      OT LONG TERM GOAL #3   Title Right hand wrist and digits AROM  improve to within functional limits with no increase symptoms    Baseline decreased ROM and opposition of right hand    Time 6    Period Weeks    Status On-going    Target Date 03/22/22      OT LONG TERM GOAL #4   Title Pt to complete  basic self care tasks with modified independence including tying shoes    Baseline difficulty with tying shoes    Time 3    Period Weeks    Status On-going    Target Date 03/22/22      OT LONG TERM GOAL #5   Title Pt able to demonstrate ability to pick up coins from tabletop with modified independence    Baseline difficulty  at eval    Time 6    Period Weeks    Status On-going    Target Date 03/22/22      Long Term Additional Goals   Additional Long Term Goals Yes      OT LONG TERM GOAL #6   Title Pt to decrease pain in left ring finger to 2/10 or less with activity.    Baseline 8/10 pain this date    Time 6    Period Weeks    Status New    Target Date 03/22/22      OT LONG TERM GOAL #7   Title Pt to demonstrate understanding of modifications to activity to avoid tight gripping and exacerbation of symptoms of left trigger finger.    Baseline decreased knowledge of trigger finger    Time 6    Period Weeks    Target Date 03/22/22                   Plan - 02/18/22 1633     Clinical Impression Statement Pt continues to respond well to therapeutic intervention, decreased pain in right hand, improved motion and improved FOTO score.  Recent resurfacing of pain in left for trigger finger but responding well to conservative efforts and pain decreased from 8 to 4 today.  Continue to work towards goals in plan of care to decrease pain, increase motion, restore strength and functional use for daily tasks.    OT Occupational Profile and History Comprehensive Assessment- Review of records and extensive additional review of physical, cognitive, psychosocial history related to current functional performance    Occupational performance deficits (Please refer to evaluation for details): ADL's;IADL's;Leisure    Body Structure / Function / Physical Skills ADL;Coordination;Scar mobility;UE functional use;Sensation;IADL;Pain;Flexibility;Dexterity;FMC;Strength;Edema;ROM    Psychosocial  Skills Habits;Environmental  Adaptations;Routines and Behaviors    Rehab Potential Good    Clinical Decision Making Limited treatment options, no task modification necessary    Comorbidities Affecting Occupational Performance: May have comorbidities impacting occupational performance    Modification or Assistance to Complete Evaluation  No modification of tasks or assist necessary to complete eval    OT Frequency 2x / week    OT Duration 6 weeks    OT Treatment/Interventions Self-care/ADL training;Cryotherapy;Paraffin;Therapeutic exercise;DME and/or AE instruction;Ultrasound;Fluidtherapy;Neuromuscular education;Manual Therapy;Scar mobilization;Splinting;Moist Heat;Contrast Bath;Passive range of motion;Therapeutic activities;Coping strategies training;Patient/family education    Consulted and Agree with Plan of Care Patient             Patient will benefit from skilled therapeutic intervention in order to improve the following deficits and impairments:   Body Structure / Function / Physical Skills: ADL, Coordination, Scar mobility, UE functional use, Sensation, IADL, Pain, Flexibility, Dexterity, FMC, Strength, Edema, ROM   Psychosocial Skills: Habits, Environmental  Adaptations, Routines and Behaviors   Visit Diagnosis: Muscle weakness (generalized)  Other lack of coordination  Pain in right wrist  Pain in right hand  Carpal tunnel syndrome, right upper limb  Pain in left hand  Stiffness of left hand, not elsewhere classified    Problem List Patient Active Problem List   Diagnosis Date Noted   Degenerative joint disease of shoulder region 02/10/2021   Carpal tunnel syndrome of left wrist 02/10/2021   Fibrillation, atrial (HCC) 01/10/2020   Obesity, unspecified 08/18/2017   Shoulder joint pain 07/05/2016   Small Ostium secundum type atrial septal defect with septal aneurysm 03/22/2016   Pain in knee 03/19/2016   Embolism involving retinal artery    Insomnia 02/16/2016  Hollenhorst plaque, right eye 02/16/2016   Broken tooth, complicated 01/23/2016   Hypertension 01/07/2016   Total knee replacement status 01/01/2016   Primary osteoarthritis of right knee 05/26/2015   Benign essential hypertension 04/17/2009   Male erectile disorder (CODE) 12/16/2008   Walter Hodges, OTR/L, CLT  Achaia Garlock, OT 02/18/2022, 4:48 PM  [facility] [address] [place], [address] Phone: [phone]   Fax:  [phone]  Name: ANKIT KOPECKY MRN: [id number] Date of Birth: 1956-08-07

## 2022-02-18 NOTE — Therapy (Signed)
[facility] MAIN [facility] SERVICES [address] [place], [place], [address] Phone: [phone]   Fax:  [phone]  Occupational Therapy Treatment  Patient Details  Name: Walter Hodges MRN: [id number] Date of Birth: 10/29/1955 Referring Provider (OT): Rosita Kea   Encounter Date: 02/16/2022   OT End of Session - 02/18/22 1652     Visit Number 8    Number of Visits 18    Date for OT Re-Evaluation 03/22/22    OT Start Time 0844    OT Stop Time 0930    OT Time Calculation (min) 46 min    Activity Tolerance Patient tolerated treatment well    Behavior During Therapy [facility] for tasks assessed/performed             Past Medical History:  Diagnosis Date   Arthritis    Bell's palsy 2010   lasted one day ago, told that he had a ministroke   Dysrhythmia    atrial fib   Headache    Hollenhorst plaque, right eye    Hypertension    followed by Dr. Nita Sells" with [facility]      Past Surgical History:  Procedure Laterality Date   CARDIOVERSION N/A 05/29/2020   Procedure: CARDIOVERSION;  Surgeon: Alwyn Pea, MD;  Location: [facility] ORS;  Service: Cardiovascular;  Laterality: N/A;   CIRCUMCISION     done as a child- given inhalation    JOINT REPLACEMENT Bilateral    KNEE CLOSED REDUCTION Left 03/19/2016   Procedure: CLOSED MANIPULATION LEFT KNEE;  Surgeon: Tarry Kos, MD;  Location: [facility] OR;  Service: Orthopedics;  Laterality: Left;   LEFT HEART CATH AND CORONARY ANGIOGRAPHY N/A 11/06/2020   Procedure: LEFT HEART CATH AND CORONARY ANGIOGRAPHY;  Surgeon: Alwyn Pea, MD;  Location: [facility] INVASIVE CV LAB;  Service: Cardiovascular;  Laterality: N/A;   TEE WITHOUT CARDIOVERSION N/A 02/19/2016   Procedure: TRANSESOPHAGEAL ECHOCARDIOGRAM (TEE);  Surgeon: Thurmon Fair, MD;  Location: [facility] ENDOSCOPY;  Service: Cardiovascular;  Laterality: N/A;   TOTAL KNEE ARTHROPLASTY Right 05/26/2015   Procedure: RIGHT TOTAL KNEE ARTHROPLASTY;  Surgeon: Tarry Kos, MD;  Location: [facility] OR;  Service: Orthopedics;  Laterality: Right;   TOTAL KNEE ARTHROPLASTY Left 01/01/2016   Procedure: LEFT TOTAL KNEE ARTHROPLASTY;  Surgeon: Tarry Kos, MD;  Location: [facility] OR;  Service: Orthopedics;  Laterality: Left;    There were no vitals filed for this visit.   Subjective Assessment - 02/18/22 1650     Subjective  Pt reports he has been really busy with appts and his work schedule.  Pt states, "I need a new brace, this one is coming apart."    Pertinent History Walter Hodges is a 66 y.o. male who presents today status post right carpal tunnel release by Dr. Kennedy Bucker on 11/27/2021. Patient having a lot of swelling, burning sensation throughout the fingers. He describes sharp bolts of lightning shooting to the fingertips.  After surgery, pt presented with Right middle and left ring finger with catching and locking with tenderness along the A1 pulley with no warmth, redness. Sensation is intact. Extensor tendons tracking well.  Pt received cortisone injections on 12/04/2021  right middle and a left ring trigger finger    Patient Stated Goals Pt would like to get function back in his hand without pain, perform self care better.    Currently in Pain? Yes    Pain Score 4     Pain Location Hand    Pain  Orientation Left    Pain Descriptors / Indicators Aching    Pain Type Acute pain    Pain Onset More than a month ago    Pain Frequency Intermittent            Pt seen for use of paraffin to right and left hand and wrist to decrease pain, increase tissue mobility to improve ROM.   Skin inspected prior to and following paraffin with no issues or irritation.  Following paraffin, pt seen for manual therapy with soft tissue massage, carpal and metacarpal spreads to decrease edema and improve lymphatic flow to the area.  Scar massage to surgical scar on the right wrist.  Use of Gua Sha tool to volar and dorsal surface of forearm to decrease adhesions and improve  ROM to distal hand and digits.    Therapeutic Exercises: Added 1# dumbell for wrist flexion/extension, supination/pronation, UD/RD for 2 sets of 10 reps each Tendon glides to right UE Oppositional movements of thumb to index and middle finger bilaterally.  Unable to achieve to ring and small fingers.   Left hand MP flexion with PIP extension No gripping with left, gentle ROM only for composite fisting.    Response to tx:   Pt continues to report decreased pain right and left hands.  Primary area of pain now is trigger finger on left but pain has decreased over the last week by 50%.  Added light weight for right wrist strengthening and motion and able to demonstrate with cues for proper form and technique.  Wearing blocking orthotic on left has decreased incidence of triggering this week.  Continue to work towards goals in plan of care to improve bilateral hand function for necessary daily tasks.                             OT Long Term Goals - 02/18/22 1606       OT LONG TERM GOAL #1   Title Patient to be independent with splint for left trigger finger to decrease pain and inflammation.    Baseline no current splint    Time 3    Period Weeks    Status New    Target Date 03/01/22      OT LONG TERM GOAL #2   Title Pt will improve FOTO score to 70 or greater to show a clinically relevant change to impact ADL independence.    Baseline Eval score of 50    Time 6    Period Weeks    Status On-going    Target Date 03/22/22      OT LONG TERM GOAL #3   Title Right hand wrist and digits AROM  improve to within functional limits with no increase symptoms    Baseline decreased ROM and opposition of right hand    Time 6    Period Weeks    Status On-going    Target Date 03/22/22      OT LONG TERM GOAL #4   Title Pt to complete basic self care tasks with modified independence including tying shoes    Baseline difficulty with tying shoes    Time 3    Period Weeks     Status On-going    Target Date 03/22/22      OT LONG TERM GOAL #5   Title Pt able to demonstrate ability to pick up coins from tabletop with modified independence    Baseline difficulty at eval  Time 6    Period Weeks    Status On-going    Target Date 03/22/22      Long Term Additional Goals   Additional Long Term Goals Yes      OT LONG TERM GOAL #6   Title Pt to decrease pain in left ring finger to 2/10 or less with activity.    Baseline 8/10 pain this date    Time 6    Period Weeks    Status New    Target Date 03/22/22      OT LONG TERM GOAL #7   Title Pt to demonstrate understanding of modifications to activity to avoid tight gripping and exacerbation of symptoms of left trigger finger.    Baseline decreased knowledge of trigger finger    Time 6    Period Weeks    Target Date 03/22/22                   Plan - 02/18/22 1652     Clinical Impression Statement Pt continues to report decreased pain right and left hands.  Primary area of pain now is trigger finger on left but pain has decreased over the last week by 50%.  Added light weight for right wrist strengthening and motion and able to demonstrate with cues for proper form and technique.  Wearing blocking orthotic on left has decreased incidence of triggering this week.  Continue to work towards goals in plan of care to improve bilateral hand function for necessary daily tasks.    OT Occupational Profile and History Comprehensive Assessment- Review of records and extensive additional review of physical, cognitive, psychosocial history related to current functional performance    Occupational performance deficits (Please refer to evaluation for details): ADL's;IADL's;Leisure    Body Structure / Function / Physical Skills ADL;Coordination;Scar mobility;UE functional use;Sensation;IADL;Pain;Flexibility;Dexterity;FMC;Strength;Edema;ROM    Psychosocial Skills Habits;Environmental  Adaptations;Routines and Behaviors     Rehab Potential Good    Clinical Decision Making Limited treatment options, no task modification necessary    Comorbidities Affecting Occupational Performance: May have comorbidities impacting occupational performance    Modification or Assistance to Complete Evaluation  No modification of tasks or assist necessary to complete eval    OT Frequency 2x / week    OT Duration 6 weeks    OT Treatment/Interventions Self-care/ADL training;Cryotherapy;Paraffin;Therapeutic exercise;DME and/or AE instruction;Ultrasound;Fluidtherapy;Neuromuscular education;Manual Therapy;Scar mobilization;Splinting;Moist Heat;Contrast Bath;Passive range of motion;Therapeutic activities;Coping strategies training;Patient/family education    Consulted and Agree with Plan of Care Patient             Patient will benefit from skilled therapeutic intervention in order to improve the following deficits and impairments:   Body Structure / Function / Physical Skills: ADL, Coordination, Scar mobility, UE functional use, Sensation, IADL, Pain, Flexibility, Dexterity, FMC, Strength, Edema, ROM   Psychosocial Skills: Habits, Environmental  Adaptations, Routines and Behaviors   Visit Diagnosis: Muscle weakness (generalized)  Other lack of coordination  Pain in right wrist  Pain in right hand  Carpal tunnel syndrome, right upper limb  Pain in left hand  Stiffness of left hand, not elsewhere classified    Problem List Patient Active Problem List   Diagnosis Date Noted   Degenerative joint disease of shoulder region 02/10/2021   Carpal tunnel syndrome of left wrist 02/10/2021   Fibrillation, atrial (HCC) 01/10/2020   Obesity, unspecified 08/18/2017   Shoulder joint pain 07/05/2016   Small Ostium secundum type atrial septal defect with septal aneurysm 03/22/2016   Pain in knee  03/19/2016   Embolism involving retinal artery    Insomnia 02/16/2016   Hollenhorst plaque, right eye 02/16/2016   Broken tooth,  complicated 01/23/2016   Hypertension 01/07/2016   Total knee replacement status 01/01/2016   Primary osteoarthritis of right knee 05/26/2015   Benign essential hypertension 04/17/2009   Male erectile disorder (CODE) 12/16/2008   Lilybelle Mayeda Cornelius Moras, OTR/L, CLT  Dontasia Miranda, OT 02/18/2022, 5:04 PM  [facility] MAIN [facility] SERVICES [address] [place], [place], [address] Phone: [phone]   Fax:  [phone]  Name: Walter Hodges MRN: [id number] Date of Birth: 1956/04/28

## 2022-02-23 ENCOUNTER — Ambulatory Visit: Payer: Medicare Other | Admitting: Physician Assistant

## 2022-02-23 ENCOUNTER — Ambulatory Visit: Payer: Medicare Other | Admitting: Podiatry

## 2022-02-23 ENCOUNTER — Ambulatory Visit: Payer: Medicare Other | Admitting: Occupational Therapy

## 2022-02-23 DIAGNOSIS — M25642 Stiffness of left hand, not elsewhere classified: Secondary | ICD-10-CM

## 2022-02-23 DIAGNOSIS — G5601 Carpal tunnel syndrome, right upper limb: Secondary | ICD-10-CM

## 2022-02-23 DIAGNOSIS — M25531 Pain in right wrist: Secondary | ICD-10-CM

## 2022-02-23 DIAGNOSIS — M6281 Muscle weakness (generalized): Secondary | ICD-10-CM | POA: Diagnosis not present

## 2022-02-23 DIAGNOSIS — M79642 Pain in left hand: Secondary | ICD-10-CM

## 2022-02-23 DIAGNOSIS — M79641 Pain in right hand: Secondary | ICD-10-CM

## 2022-02-23 DIAGNOSIS — R278 Other lack of coordination: Secondary | ICD-10-CM

## 2022-02-25 ENCOUNTER — Other Ambulatory Visit: Payer: Medicare Other

## 2022-02-25 DIAGNOSIS — M19019 Primary osteoarthritis, unspecified shoulder: Secondary | ICD-10-CM | POA: Insufficient documentation

## 2022-02-27 ENCOUNTER — Encounter: Payer: Self-pay | Admitting: Occupational Therapy

## 2022-03-03 ENCOUNTER — Other Ambulatory Visit: Payer: Self-pay | Admitting: *Deleted

## 2022-03-03 ENCOUNTER — Encounter: Payer: Medicare Other | Admitting: Occupational Therapy

## 2022-03-03 DIAGNOSIS — E291 Testicular hypofunction: Secondary | ICD-10-CM

## 2022-03-04 ENCOUNTER — Other Ambulatory Visit: Payer: Medicare Other

## 2022-03-04 ENCOUNTER — Encounter: Payer: Medicare Other | Admitting: Occupational Therapy

## 2022-03-11 ENCOUNTER — Ambulatory Visit: Payer: Medicare Other | Attending: Orthopedic Surgery | Admitting: Occupational Therapy

## 2022-03-16 ENCOUNTER — Ambulatory Visit: Payer: Medicare Other | Admitting: Podiatry

## 2022-03-16 ENCOUNTER — Ambulatory Visit: Payer: Medicare Other | Admitting: Occupational Therapy

## 2022-03-19 ENCOUNTER — Ambulatory Visit: Payer: Medicare Other | Admitting: Occupational Therapy

## 2022-03-23 ENCOUNTER — Ambulatory Visit: Payer: Medicare Other | Admitting: Occupational Therapy

## 2022-03-25 ENCOUNTER — Ambulatory Visit: Payer: Medicare Other

## 2022-03-30 ENCOUNTER — Encounter: Payer: Medicare Other | Admitting: Occupational Therapy

## 2022-03-31 DIAGNOSIS — M75120 Complete rotator cuff tear or rupture of unspecified shoulder, not specified as traumatic: Secondary | ICD-10-CM | POA: Insufficient documentation

## 2022-04-01 ENCOUNTER — Ambulatory Visit: Payer: Medicare Other | Admitting: Occupational Therapy

## 2022-04-02 ENCOUNTER — Telehealth: Payer: Self-pay | Admitting: Urology

## 2022-04-02 NOTE — Telephone Encounter (Signed)
Patient came into the office today requesting a testosterone injection.   I advised him that he missed a lab appointment on 6/29 and would need to reschedule his lab appointment before we can schedule another testosterone injection.   Lab appointment has been rescheduled for Monday, 04/05/22 at 8:45am.

## 2022-04-05 ENCOUNTER — Other Ambulatory Visit: Payer: Medicare Other

## 2022-04-05 DIAGNOSIS — E291 Testicular hypofunction: Secondary | ICD-10-CM

## 2022-04-06 LAB — TESTOSTERONE: Testosterone: 656 ng/dL (ref 264–916)

## 2022-04-06 LAB — HEMOGLOBIN AND HEMATOCRIT, BLOOD
Hematocrit: 42.9 % (ref 37.5–51.0)
Hemoglobin: 13.9 g/dL (ref 13.0–17.7)

## 2022-04-07 ENCOUNTER — Telehealth: Payer: Self-pay | Admitting: Family Medicine

## 2022-04-07 NOTE — Telephone Encounter (Signed)
Patient notified and voiced understanding. Appointment have been made.

## 2022-04-07 NOTE — Telephone Encounter (Signed)
-----   Message from Harle Battiest, PA-C sent at 04/06/2022  9:57 AM EDT ----- He is cleared to continue his testosterone injections, but he will need an office visit in September with PSA, testosterone midway between injection dates, hemoglobin hematocrit, IPSS, shim and exam.

## 2022-04-08 NOTE — Telephone Encounter (Signed)
Pt LM on triage line requesting a call back from [place].

## 2022-04-08 NOTE — Telephone Encounter (Signed)
Spoke to patient. He was confirming his appointment times.

## 2022-04-09 ENCOUNTER — Ambulatory Visit (INDEPENDENT_AMBULATORY_CARE_PROVIDER_SITE_OTHER): Payer: Medicare Other | Admitting: Physician Assistant

## 2022-04-09 DIAGNOSIS — E349 Endocrine disorder, unspecified: Secondary | ICD-10-CM

## 2022-04-09 DIAGNOSIS — E291 Testicular hypofunction: Secondary | ICD-10-CM | POA: Diagnosis not present

## 2022-04-09 MED ORDER — TESTOSTERONE CYPIONATE 200 MG/ML IM SOLN
100.0000 mg | INTRAMUSCULAR | 0 refills | Status: DC
Start: 2022-04-09 — End: 2022-07-26

## 2022-04-09 MED ORDER — TESTOSTERONE CYPIONATE 200 MG/ML IM SOLN
200.0000 mg | Freq: Once | INTRAMUSCULAR | Status: AC
  Administered 2022-04-09: 200 mg via INTRAMUSCULAR

## 2022-04-09 NOTE — Progress Notes (Signed)
Patient ID: Walter Hodges, male   DOB: Sep 03, 1956, 66 y.o.   MRN: [id number] Testosterone IM Injection  Due to Hypogonadism patient is present today for a Testosterone Injection.  Medication: Testosterone Cypionate Dose: 0.59ml Location: right upper outer buttocks Lot: [id number] Exp:04/07/23  Patient tolerated well, no complications were noted  Performed by: Mervin Hack, CMA   Follow up: 2 weeks

## 2022-04-13 ENCOUNTER — Encounter: Payer: Medicare Other | Admitting: Occupational Therapy

## 2022-04-15 ENCOUNTER — Encounter: Payer: Medicare Other | Admitting: Occupational Therapy

## 2022-04-20 ENCOUNTER — Encounter: Payer: Medicare Other | Admitting: Occupational Therapy

## 2022-04-23 ENCOUNTER — Ambulatory Visit: Payer: Medicare Other | Admitting: Urology

## 2022-04-23 NOTE — Progress Notes (Incomplete)
Testosterone IM Injection  Due to Hypogonadism patient is present today for a Testosterone Injection.  Medication: Testosterone Cypionate Dose: *** Location: {RIGHT/LEFT:20294} upper outer buttocks Lot: *** Exp:***  Patient tolerated well, {dnt complications:20057}  Performed by: ***  Follow up: ***  

## 2022-04-26 ENCOUNTER — Ambulatory Visit (INDEPENDENT_AMBULATORY_CARE_PROVIDER_SITE_OTHER): Payer: Medicare Other | Admitting: Physician Assistant

## 2022-04-26 DIAGNOSIS — E291 Testicular hypofunction: Secondary | ICD-10-CM

## 2022-04-26 MED ORDER — TESTOSTERONE CYPIONATE 200 MG/ML IM SOLN
100.0000 mg | Freq: Once | INTRAMUSCULAR | Status: AC
  Administered 2022-04-26: 100 mg via INTRAMUSCULAR

## 2022-04-26 NOTE — Progress Notes (Signed)
Testosterone IM Injection  Due to Hypogonadism patient is present today for a Testosterone Injection.  Medication: Testosterone Cypionate Dose: 0.5mg  Location: left upper outer buttocks Lot: [id number] Exp:11/2024   Patient tolerated well, no complications were noted.  Performed by: Debbe Bales, CMA   Follow up: RTC in 2 weeks for injection

## 2022-04-27 ENCOUNTER — Encounter: Payer: Medicare Other | Admitting: Occupational Therapy

## 2022-04-27 ENCOUNTER — Telehealth: Payer: Self-pay

## 2022-04-27 NOTE — Telephone Encounter (Signed)
Incoming call from pt who inquires if going forward he can have RX written for 59ml vial in order to cut cost as he is only doing 0.65ml if a single dose vial which causes half of the medication to be wasted. Please advise.

## 2022-04-28 ENCOUNTER — Other Ambulatory Visit: Payer: Self-pay | Admitting: Urology

## 2022-04-28 DIAGNOSIS — N5201 Erectile dysfunction due to arterial insufficiency: Secondary | ICD-10-CM

## 2022-04-28 MED ORDER — AMBULATORY NON FORMULARY MEDICATION: 0 refills | Status: DC

## 2022-04-28 NOTE — Telephone Encounter (Signed)
Pt has 4 single dose vials before he will need a refill. Pt expresses frustration over having to throw away the unused portion at each injection as he believes this is wasting money, he is wondering if there is a way to change his dosing to waste less. He is scheduled for OV w Carollee Herter 06/01/22, advised pt he will need to talk w Carollee Herter ab this matter at that appt. He expressed understanding. Pt is also requesting a refill on Trimix.

## 2022-04-29 ENCOUNTER — Encounter: Payer: Medicare Other | Admitting: Occupational Therapy

## 2022-05-11 ENCOUNTER — Ambulatory Visit: Payer: Medicare Other | Admitting: Urology

## 2022-05-12 ENCOUNTER — Other Ambulatory Visit: Payer: Self-pay

## 2022-05-12 ENCOUNTER — Ambulatory Visit (INDEPENDENT_AMBULATORY_CARE_PROVIDER_SITE_OTHER): Payer: Medicare Other | Admitting: Urology

## 2022-05-12 DIAGNOSIS — E291 Testicular hypofunction: Secondary | ICD-10-CM

## 2022-05-12 DIAGNOSIS — N401 Enlarged prostate with lower urinary tract symptoms: Secondary | ICD-10-CM

## 2022-05-12 DIAGNOSIS — N138 Other obstructive and reflux uropathy: Secondary | ICD-10-CM

## 2022-05-12 DIAGNOSIS — E349 Endocrine disorder, unspecified: Secondary | ICD-10-CM

## 2022-05-12 MED ORDER — TESTOSTERONE CYPIONATE 200 MG/ML IM SOLN
100.0000 mg | Freq: Once | INTRAMUSCULAR | Status: AC
  Administered 2022-05-12: 100 mg via INTRAMUSCULAR

## 2022-05-12 NOTE — Progress Notes (Signed)
Testosterone IM Injection  Due to Hypogonadism patient is present today for a Testosterone Injection.  Medication: Testosterone Cypionate Dose: 0.39mL/100mg  Location: right upper outer buttocks Lot: [id number] Exp:11/2024  Patient tolerated well, no complications were noted  Performed by: Franchot Erichsen CMA  Follow up: RTC in 2 weeks for labs and next injection.

## 2022-05-13 LAB — PSA: Prostate Specific Ag, Serum: 1.1 ng/mL (ref 0.0–4.0)

## 2022-05-19 ENCOUNTER — Other Ambulatory Visit: Payer: Self-pay | Admitting: *Deleted

## 2022-05-19 DIAGNOSIS — N138 Other obstructive and reflux uropathy: Secondary | ICD-10-CM

## 2022-05-19 DIAGNOSIS — E349 Endocrine disorder, unspecified: Secondary | ICD-10-CM

## 2022-05-26 ENCOUNTER — Ambulatory Visit: Payer: Medicare Other | Admitting: Physician Assistant

## 2022-05-27 ENCOUNTER — Ambulatory Visit (INDEPENDENT_AMBULATORY_CARE_PROVIDER_SITE_OTHER): Payer: Medicare Other | Admitting: Physician Assistant

## 2022-05-27 ENCOUNTER — Other Ambulatory Visit: Payer: Medicare Other

## 2022-05-27 DIAGNOSIS — E349 Endocrine disorder, unspecified: Secondary | ICD-10-CM

## 2022-05-27 DIAGNOSIS — E291 Testicular hypofunction: Secondary | ICD-10-CM

## 2022-05-27 DIAGNOSIS — N401 Enlarged prostate with lower urinary tract symptoms: Secondary | ICD-10-CM

## 2022-05-27 MED ORDER — TESTOSTERONE CYPIONATE 200 MG/ML IM SOLN
100.0000 mg | Freq: Once | INTRAMUSCULAR | Status: AC
  Administered 2022-05-27: 100 mg via INTRAMUSCULAR

## 2022-05-27 NOTE — Progress Notes (Signed)
Testosterone IM Injection  Due to Hypogonadism patient is present today for a Testosterone Injection.  Medication: Testosterone Cypionate Dose: 0.5ML Location: right upper outer buttocks Lot: [id number] Exp:11/2024  Patient tolerated well, no complications were noted  Performed by: S.Chord Takahashi, CMA

## 2022-05-28 LAB — TESTOSTERONE: Testosterone: 590 ng/dL (ref 264–916)

## 2022-05-28 LAB — PSA: Prostate Specific Ag, Serum: 1.4 ng/mL (ref 0.0–4.0)

## 2022-05-28 LAB — HEMOGLOBIN AND HEMATOCRIT, BLOOD
Hematocrit: 42.3 % (ref 37.5–51.0)
Hemoglobin: 14.5 g/dL (ref 13.0–17.7)

## 2022-05-31 NOTE — Progress Notes (Unsigned)
06/01/22 9:46 AM   Walter Hodges 1955-12-05 [id number]  Referring provider:  Donnie Coffin, MD [place],  [place] [address]  Urological history: 1. Testosterone deficiency -contributing factors of age and obesity -testosterone (05/2022) 590 -hemoglobin/hematocrit (05/2022) 14.5/42.3 -managed with testosterone cypionate 200 mg/mL, 0.5 cc every 14 days   2. ED -contributing factors of age, HTN, BPH, testosterone deficiency and obesity -failed PDE5i's -SHIM 12 -managed with Trimix (30/1/50) 1 cc    3. BPH with LU TS - PSA (05/2022) 1.4 - I PSS 4/2   HPI: Walter Hodges is a 66 y.o.male who presents today for follow up.    He is currently receiving testosterone cypionate injections every 2 weeks through our office.  He has not had any issues with the injections.    I PSS 4/2  He has nocturia x 2-3.  Patient denies any modifying or aggravating factors.  Patient denies any gross hematuria, dysuria or suprapubic/flank pain.  Patient denies any fevers, chills, nausea or vomiting.      IPSS     Row Name 06/01/22 0900         International Prostate Symptom Score   How often have you had the sensation of not emptying your bladder? Not at All     How often have you had to urinate less than every two hours? Less than 1 in 5 times     How often have you found you stopped and started again several times when you urinated? Not at All     How often have you found it difficult to postpone urination? Not at All     How often have you had a weak urinary stream? Not at All     How often have you had to strain to start urination? Not at All     How many times did you typically get up at night to urinate? 3 Times     Total IPSS Score 4       Quality of Life due to urinary symptoms   If you were to spend the rest of your life with your urinary condition just the way it is now how would you feel about that? Mostly Satisfied               Score:  1-7  Mild 8-19 Moderate 20-35 Severe  Patient is not having spontaneous erections.  He denies any pain or curvature with erections.    SHIM     Row Name 06/01/22 0914         SHIM: Over the last 6 months:   How do you rate your confidence that you could get and keep an erection? Low     When you had erections with sexual stimulation, how often were your erections hard enough for penetration (entering your partner)? A Few Times (much less than half the time)     During sexual intercourse, how often were you able to maintain your erection after you had penetrated (entered) your partner? A Few Times (much less than half the time)     During sexual intercourse, how difficult was it to maintain your erection to completion of intercourse? Difficult     When you attempted sexual intercourse, how often was it satisfactory for you? Sometimes (about half the time)       SHIM Total Score   SHIM 12                 PMH:  Past Medical History:  Diagnosis Date   Arthritis    Bell's palsy 2010   lasted one day ago, told that he had a ministroke   Dysrhythmia    atrial fib   Headache    Hollenhorst plaque, right eye    Hypertension    followed by Dr. Velta Addison" with [facility]      Surgical History: Past Surgical History:  Procedure Laterality Date   CARDIOVERSION N/A 05/29/2020   Procedure: CARDIOVERSION;  Surgeon: Yolonda Kida, MD;  Location: [facility] ORS;  Service: Cardiovascular;  Laterality: N/A;   CIRCUMCISION     done as a child- given inhalation    JOINT REPLACEMENT Bilateral    KNEE CLOSED REDUCTION Left 03/19/2016   Procedure: CLOSED MANIPULATION LEFT KNEE;  Surgeon: Leandrew Koyanagi, MD;  Location: [place];  Service: Orthopedics;  Laterality: Left;   LEFT HEART CATH AND CORONARY ANGIOGRAPHY N/A 11/06/2020   Procedure: LEFT HEART CATH AND CORONARY ANGIOGRAPHY;  Surgeon: Yolonda Kida, MD;  Location: [place] CV LAB;  Service: Cardiovascular;  Laterality: N/A;   TEE  WITHOUT CARDIOVERSION N/A 02/19/2016   Procedure: TRANSESOPHAGEAL ECHOCARDIOGRAM (TEE);  Surgeon: Sanda Klein, MD;  Location: [place];  Service: Cardiovascular;  Laterality: N/A;   TOTAL KNEE ARTHROPLASTY Right 05/26/2015   Procedure: RIGHT TOTAL KNEE ARTHROPLASTY;  Surgeon: Leandrew Koyanagi, MD;  Location: [place];  Service: Orthopedics;  Laterality: Right;   TOTAL KNEE ARTHROPLASTY Left 01/01/2016   Procedure: LEFT TOTAL KNEE ARTHROPLASTY;  Surgeon: Leandrew Koyanagi, MD;  Location: [place];  Service: Orthopedics;  Laterality: Left;    Home Medications:  Allergies as of 06/01/2022       Reactions   Ace Inhibitors Swelling        Medication List        Accurate as of June 01, 2022  9:46 AM. If you have any questions, ask your nurse or doctor.          AMBULATORY NON FORMULARY MEDICATION Trimix (30/1/50)-(Pap/Phent/PGE)  Dosage: Inject 1 cc per injection   Vial 65m  Qty #10 Refills 6  [phone] [phone]  amiodarone 200 MG tablet Commonly known as: PACERONE Take 200 mg by mouth daily.   amLODipine 5 MG tablet Commonly known as: NORVASC Take 5 mg by mouth daily.   baclofen 10 MG tablet Commonly known as: LIORESAL Take 10 mg by mouth 2 (two) times daily as needed for muscle spasms.   Cialis 20 MG tablet Generic drug: tadalafil Take 20 mg by mouth as needed for erectile dysfunction.   diclofenac Sodium 1 % Gel Commonly known as: VOLTAREN Apply 2 g topically 4 (four) times daily as needed (pain.).   Eliquis 5 MG Tabs tablet Generic drug: apixaban Take 5 mg by mouth 2 (two) times daily.   EPINEPHrine 0.3 mg/0.3 mL Soaj injection Commonly known as: EPI-PEN Inject 0.3 mg into the muscle as needed for anaphylaxis.   gabapentin 600 MG tablet Commonly known as: NEURONTIN Take 600 mg by mouth 3 (three) times daily.   ibuprofen 800 MG tablet Commonly known as: ADVIL Take 800 mg by mouth 3 (three) times daily.   isosorbide  mononitrate 30 MG 24 hr tablet Commonly known as: IMDUR Take 30 mg by mouth daily.   losartan 100 MG tablet Commonly known as: COZAAR Take 100 mg by mouth daily.   lubiprostone 8 MCG capsule Commonly known as: AMITIZA Take by mouth.   metoprolol succinate 25 MG 24  hr tablet Commonly known as: TOPROL-XL Take 25 mg by mouth daily.   Narcan 4 MG/0.1ML Liqd nasal spray kit Generic drug: naloxone Place 1 spray into the nose as needed (overdose).   nitroGLYCERIN 0.4 MG SL tablet Commonly known as: NITROSTAT Place 0.4 mg under the tongue every 5 (five) minutes x 3 doses as needed for chest pain.   omeprazole 20 MG capsule Commonly known as: PRILOSEC Take 20 mg by mouth 2 (two) times daily.   oxyCODONE-acetaminophen 10-325 MG tablet Commonly known as: PERCOCET Take 1 tablet by mouth every 4 (four) hours as needed.   testosterone cypionate 200 MG/ML injection Commonly known as: DEPOTESTOSTERONE CYPIONATE Inject 0.5 mLs (100 mg total) into the muscle every 14 (fourteen) days.   Viagra 100 MG tablet Generic drug: sildenafil Take by mouth.        Allergies:  Allergies  Allergen Reactions   Ace Inhibitors Swelling    Family History: Family History  Problem Relation Age of Onset   Headache Neg Hx     Social History:  reports that he has never smoked. He has never used smokeless tobacco. He reports that he does not drink alcohol and does not use drugs.   Physical Exam: BP 134/80   Pulse 96   Ht 5' 10"  (1.778 m)   Wt 247 lb (112 kg)   BMI 35.44 kg/m   Constitutional:  Well nourished. Alert and oriented, No acute distress. HEENT: [place] AT, moist mucus membranes.  Trachea midline Cardiovascular: No clubbing, cyanosis, or edema. Respiratory: Normal respiratory effort, no increased work of breathing. Neurologic: Grossly intact, no focal deficits, moving all 4 extremities. Psychiatric: Normal mood and affect.   Laboratory Data: Component     Latest Ref Rng 05/27/2022   Prostate Specific Ag, Serum     0.0 - 4.0 ng/mL 1.4    Component     Latest Ref Rng 05/27/2022  Testosterone     264 - 916 ng/dL 590    Component     Latest Ref Rng 05/27/2022  Hemoglobin     13.0 - 17.7 g/dL 14.5   HCT     37.5 - 51.0 % 42.3   I have reviewed the labs.   Pertinent imaging: N/A  Assessment & Plan:    1. Testosterone deficiency  -Testosterone level is at therapeutic range -continue testosterone cypionate 200 mg/mL, 0.5 cc every 14 days    2. BPH with LUTS -PSA stable   3. Erectile dysfunction:    -continue Trimix (30/1/50)-1 cc prn for intercourse -refill sent  Return in about 6 months (around 11/30/2022) for PSA, testosterone (one week after injection) H & H, SHIM, IPSS and exam .  Zara Council, [place] [address] [place], [place] [address] [phone]

## 2022-06-01 ENCOUNTER — Ambulatory Visit (INDEPENDENT_AMBULATORY_CARE_PROVIDER_SITE_OTHER): Payer: Medicare Other | Admitting: Urology

## 2022-06-01 ENCOUNTER — Other Ambulatory Visit: Payer: Self-pay

## 2022-06-01 ENCOUNTER — Ambulatory Visit: Payer: Medicare Other | Admitting: Urology

## 2022-06-01 ENCOUNTER — Encounter: Payer: Self-pay | Admitting: Urology

## 2022-06-01 VITALS — BP 134/80 | HR 96 | Ht 70.0 in | Wt 247.0 lb

## 2022-06-01 DIAGNOSIS — N401 Enlarged prostate with lower urinary tract symptoms: Secondary | ICD-10-CM | POA: Diagnosis not present

## 2022-06-01 DIAGNOSIS — N5201 Erectile dysfunction due to arterial insufficiency: Secondary | ICD-10-CM

## 2022-06-01 DIAGNOSIS — E291 Testicular hypofunction: Secondary | ICD-10-CM | POA: Diagnosis not present

## 2022-06-01 DIAGNOSIS — N138 Other obstructive and reflux uropathy: Secondary | ICD-10-CM | POA: Diagnosis not present

## 2022-06-01 DIAGNOSIS — E349 Endocrine disorder, unspecified: Secondary | ICD-10-CM

## 2022-06-01 MED ORDER — AMBULATORY NON FORMULARY MEDICATION: 0 refills | Status: DC

## 2022-06-10 ENCOUNTER — Ambulatory Visit: Payer: Medicare Other | Admitting: Physician Assistant

## 2022-06-15 NOTE — H&P (Signed)
Patient's anticipated LOS is less than 2 midnights, meeting these requirements: - Younger than 59 - Lives within 1 hour of care - Has a competent adult at home to recover with post-op recover - NO history of  - Chronic pain requiring opiods  - Diabetes  - Coronary Artery Disease  - Heart failure  - Heart attack  - Stroke  - DVT/VTE  - Cardiac arrhythmia  - Respiratory Failure/COPD  - Renal failure  - Anemia  - Advanced Liver disease     Walter Hodges is an 66 y.o. male.    Chief Complaint: left shoulder pain  HPI: Pt is a 66 y.o. male complaining of left shoulder pain for multiple years. Pain had continually increased since the beginning. X-rays in the clinic show end-stage arthritic changes of the left shoulder. Pt has tried various conservative treatments which have failed to alleviate their symptoms, including injections and therapy. Various options are discussed with the patient. Risks, benefits and expectations were discussed with the patient. Patient understand the risks, benefits and expectations and wishes to proceed with surgery.   PCP:  Donnie Coffin, MD  D/C Plans: Home  PMH: Past Medical History:  Diagnosis Date   Arthritis    Bell's palsy 2010   lasted one day ago, told that he had a ministroke   Dysrhythmia    atrial fib   Headache    Hollenhorst plaque, right eye    Hypertension    followed by Dr. Velta Addison" [facility]      PSH: Past Surgical History:  Procedure Laterality Date   CARDIOVERSION N/A 05/29/2020   Procedure: CARDIOVERSION;  Surgeon: Yolonda Kida, MD;  Location: [facility];  Service: Cardiovascular;  Laterality: N/A;   CIRCUMCISION     done as a child- given inhalation    JOINT REPLACEMENT Bilateral    KNEE CLOSED REDUCTION Left 03/19/2016   Procedure: CLOSED MANIPULATION LEFT KNEE;  Surgeon: Leandrew Koyanagi, MD;  Location: [place];  Service: Orthopedics;  Laterality: Left;   LEFT HEART CATH AND CORONARY ANGIOGRAPHY N/A 11/06/2020    Procedure: LEFT HEART CATH AND CORONARY ANGIOGRAPHY;  Surgeon: Yolonda Kida, MD;  Location: [place] [facility];  Service: Cardiovascular;  Laterality: N/A;   TEE WITHOUT CARDIOVERSION N/A 02/19/2016   Procedure: TRANSESOPHAGEAL ECHOCARDIOGRAM (TEE);  Surgeon: Sanda Klein, MD;  Location: [place];  Service: Cardiovascular;  Laterality: N/A;   TOTAL KNEE ARTHROPLASTY Right 05/26/2015   Procedure: RIGHT TOTAL KNEE ARTHROPLASTY;  Surgeon: Leandrew Koyanagi, MD;  Location: [place];  Service: Orthopedics;  Laterality: Right;   TOTAL KNEE ARTHROPLASTY Left 01/01/2016   Procedure: LEFT TOTAL KNEE ARTHROPLASTY;  Surgeon: Leandrew Koyanagi, MD;  Location: [place];  Service: Orthopedics;  Laterality: Left;    Social History:  reports that he has never smoked. He has never used smokeless tobacco. He reports that he does not drink alcohol and does not use drugs. BMI: Estimated body mass index is 35.44 kg/m as calculated from the following:   Height as of 06/01/22: 5' 10"  (1.778 m).   Weight as of 06/01/22: 112 kg.  Lab Results  Component Value Date   ALBUMIN 4.0 12/10/2019   Diabetes: Patient does not have a diagnosis of diabetes.     Smoking Status:   reports that he has never smoked. He has never used smokeless tobacco.    Allergies:  Allergies  Allergen Reactions   Ace Inhibitors Swelling    Medications: Current Facility-Administered Medications  Medication Dose Route Frequency Provider Last Rate Last Admin   testosterone cypionate (DEPOTESTOSTERONE CYPIONATE) injection 200 mg  200 mg Intramuscular Once Vaillancourt, Samantha, PA-C       Current Outpatient Medications  Medication Sig Dispense Refill   AMBULATORY NON FORMULARY MEDICATION Trimix (30/1/50)-(Pap/Phent/PGE)  Dosage: Inject 1 cc per injection   Vial 27m  Qty #10 Refills 6  [phone] [phone] vial 0   amiodarone (PACERONE) 200 MG tablet Take 200 mg by mouth daily.     amLODipine  (NORVASC) 5 MG tablet Take 5 mg by mouth daily.     baclofen (LIORESAL) 10 MG tablet Take 10 mg by mouth 2 (two) times daily as needed for muscle spasms.     CIALIS 20 MG tablet Take 20 mg by mouth as needed for erectile dysfunction.     diclofenac Sodium (VOLTAREN) 1 % GEL Apply 2 g topically 4 (four) times daily as needed (pain.).     ELIQUIS 5 MG TABS tablet Take 5 mg by mouth 2 (two) times daily.     EPINEPHrine 0.3 mg/0.3 mL IJ SOAJ injection Inject 0.3 mg into the muscle as needed for anaphylaxis. 1 each 0   gabapentin (NEURONTIN) 600 MG tablet Take 600 mg by mouth 3 (three) times daily.     ibuprofen (ADVIL) 800 MG tablet Take 800 mg by mouth 3 (three) times daily.     isosorbide mononitrate (IMDUR) 30 MG 24 hr tablet Take 30 mg by mouth daily.     losartan (COZAAR) 100 MG tablet Take 100 mg by mouth daily.     lubiprostone (AMITIZA) 8 MCG capsule Take by mouth.     metoprolol succinate (TOPROL-XL) 25 MG 24 hr tablet Take 25 mg by mouth daily.     NARCAN 4 MG/0.1ML LIQD nasal spray kit Place 1 spray into the nose as needed (overdose).     nitroGLYCERIN (NITROSTAT) 0.4 MG SL tablet Place 0.4 mg under the tongue every 5 (five) minutes x 3 doses as needed for chest pain.     omeprazole (PRILOSEC) 20 MG capsule Take 20 mg by mouth 2 (two) times daily.     oxyCODONE-acetaminophen (PERCOCET) 10-325 MG tablet Take 1 tablet by mouth every 4 (four) hours as needed.     testosterone cypionate (DEPOTESTOSTERONE CYPIONATE) 200 MG/ML injection Inject 0.5 mLs (100 mg total) into the muscle every 14 (fourteen) days. 5 mL 0   VIAGRA 100 MG tablet Take by mouth.      No results found for this or any previous visit (from the past 48 hour(s)). No results found.  ROS: Pain with rom of the left upper extremity  Physical Exam: Alert and oriented 66y.o. male in no acute distress Cranial nerves 2-12 intact Cervical spine: full rom with no tenderness, nv intact distally Chest: active breath sounds  bilaterally, no wheeze rhonchi or rales Heart: regular rate and rhythm, no murmur Abd: non tender non distended with active bowel sounds Hip is stable with rom  Left shoulder pain and weakness with rom Nv intact distally No rashes or edema distally  Assessment/Plan Assessment: left shoulder cuff arthropathy  Plan:  Patient will undergo a left reverse total shoulder [facility] benefits and expectations were discussed with the patient. Patient understand risks, benefits and expectations and wishes to proceed. Preoperative templating of the joint replacement has been completed, documented, and submitted to the Operating Room personnel in order to optimize intra-operative equipment management.  Merla Riches PA-C, MPAS [facility] is now [employer] [address], [place], [place], [place] [address] Phone: [phone] [email or web address] Facebook  [employer]

## 2022-06-19 NOTE — Patient Instructions (Signed)
SURGICAL WAITING ROOM VISITATION Patients having surgery or a procedure may have no more than 2 support people in the waiting area - these visitors may rotate in the visitor waiting room.   Children under the age of 33 must have an adult with them who is not the patient. If the patient needs to stay at the hospital during part of their recovery, the visitor guidelines for inpatient rooms apply.  PRE-OP VISITATION  Pre-op nurse will coordinate an appropriate time for 1 support person to accompany the patient in pre-op.  This support person may not rotate.  This visitor will be contacted when the time is appropriate for the visitor to come back in the pre-op area.  Please refer to the [facility] website for the visitor guidelines for Inpatients (after your surgery is over and you are in a regular room).  You are not required to quarantine at this time prior to your surgery. However, you must do this: Hand Hygiene often Do NOT share personal items Notify your provider if you are in close contact with someone who has COVID or you develop fever 100.4 or greater, new onset of sneezing, cough, sore throat, shortness of breath or body aches.   If you received a COVID test during your pre-op visit  it is requested that you wear a mask when out in public, stay away from anyone that may not be feeling well and notify your surgeon if you develop symptoms. If you test positive for Covid or have been in contact with anyone that has tested positive in the last 10 days please notify you surgeon.       Your procedure is scheduled on:  Monday July 05, 2022  Report to [facility] Main Entrance.  Report to admitting at:  10:15 AM  +++++Call this number if you have any questions or problems the morning of surgery [phone]  Do not eat food :After Midnight the night prior to your surgery/procedure.  After Midnight you may have the following liquids until  09:45 AM DAY OF SURGERY  Clear  Liquid Diet Water Black Coffee (sugar ok, NO MILK/CREAM OR CREAMERS)  Tea (sugar ok, NO MILK/CREAM OR CREAMERS) regular and decaf                             Plain Jell-O  with no fruit (NO RED)                                           Fruit ices (not with fruit pulp, NO RED)                                     Popsicles (NO RED)                                                                  Juice: apple, WHITE grape, WHITE cranberry Sports drinks like Gatorade or Powerade (NO RED)  The day of surgery:  Drink ONE (1) Pre-Surgery Clear Ensure at    09:45  AM the morning of surgery. Drink in one sitting. Do not sip.  This drink was given to you during your hospital pre-op appointment visit. Nothing else to drink after completing the Pre-Surgery Clear Ensure : No candy, chewing gum or throat lozenges.    FOLLOW  ANY ADDITIONAL PRE OP INSTRUCTIONS YOU RECEIVED FROM YOUR SURGEON'S OFFICE!!!   Oral Hygiene is also important to reduce your risk of infection.        Remember - BRUSH YOUR TEETH THE MORNING OF SURGERY WITH YOUR REGULAR TOOTHPASTE  Do NOT smoke after Midnight the night before surgery.  Take ONLY these medicines the morning of surgery with A SIP OF WATER: Amiodarone, Amlodipine, Omeprazole  ????                     You may not have any metal on your body including  jewelry, and body piercing  Do not wear  lotions, powders,  cologne, or deodorant  Men may shave face and neck.  Contacts, Hearing Aids, dentures or bridgework may not be worn into surgery.   You may bring a small overnight bag with you on the day of surgery, only pack items that are not valuable .[place] IS NOT RESPONSIBLE   FOR VALUABLES THAT ARE LOST OR STOLEN.   DO NOT [place]. PHARMACY WILL DISPENSE MEDICATIONS LISTED ON YOUR MEDICATION LIST TO YOU DURING YOUR ADMISSION [place]!    Special Instructions: Bring a copy of your  healthcare power of attorney and living will documents the day of surgery, if you wish to have them scanned into your [facility] Medical Records- EPIC  Please read over the following fact sheets you were given: IF YOU HAVE QUESTIONS ABOUT YOUR PRE-OP INSTRUCTIONS, PLEASE CALL [phone]  ([place])   [place] - Preparing for Surgery Before surgery, you can play an important role.  Because skin is not sterile, your skin needs to be as free of germs as possible.  You can reduce the number of germs on your skin by washing with CHG (chlorahexidine gluconate) soap before surgery.  CHG is an antiseptic cleaner which kills germs and bonds with the skin to continue killing germs even after washing. Please DO NOT use if you have an allergy to CHG or antibacterial soaps.  If your skin becomes reddened/irritated stop using the CHG and inform your nurse when you arrive at Short Stay. Do not shave (including legs and underarms) for at least 48 hours prior to the first CHG shower.  You may shave your face/neck.  Please follow these instructions carefully:  1.  Shower with CHG Soap the night before surgery and the  morning of surgery.  2.  If you choose to wash your hair, wash your hair first as usual with your normal  shampoo.  3.  After you shampoo, rinse your hair and body thoroughly to remove the shampoo.                             4.  Use CHG as you would any other liquid soap.  You can apply chg directly to the skin and wash.  Gently with a scrungie or clean washcloth.  5.  Apply the CHG Soap to your body ONLY FROM THE NECK DOWN.   Do not use on  face/ open                           Wound or open sores. Avoid contact with eyes, ears mouth and genitals (private parts).                       Wash face,  Genitals (private parts) with your normal soap.             6.  Wash thoroughly, paying special attention to the area where your  surgery  will be performed.  7.  Thoroughly rinse your body with warm water  from the neck down.  8.  DO NOT shower/wash with your normal soap after using and rinsing off the CHG Soap.            9.  Pat yourself dry with a clean towel.            10.  Wear clean pajamas.            11.  Place clean sheets on your bed the night of your first shower and do not  sleep with pets.  ON THE DAY OF SURGERY : Do not apply any lotions/deodorants the morning of surgery.  Please wear clean clothes to the hospital/surgery center.    FAILURE TO FOLLOW THESE INSTRUCTIONS MAY RESULT IN THE CANCELLATION OF YOUR SURGERY  PATIENT SIGNATURE_________________________________  NURSE SIGNATURE__________________________________  ________________________________________________________________________       Adam Phenix    An incentive spirometer is a tool that can help keep your lungs clear and active. This tool measures how well you are filling your lungs with each breath. Taking long deep breaths may help reverse or decrease the chance of developing breathing (pulmonary) problems (especially infection) following: A long period of time when you are unable to move or be active. BEFORE THE PROCEDURE  If the spirometer includes an indicator to show your best effort, your nurse or respiratory therapist will set it to a desired goal. If possible, sit up straight or lean slightly forward. Try not to slouch. Hold the incentive spirometer in an upright position. INSTRUCTIONS FOR USE  Sit on the edge of your bed if possible, or sit up as far as you can in bed or on a chair. Hold the incentive spirometer in an upright position. Breathe out normally. Place the mouthpiece in your mouth and seal your lips tightly around it. Breathe in slowly and as deeply as possible, raising the piston or the ball toward the top of the column. Hold your breath for 3-5 seconds or for as long as possible. Allow the piston or ball to fall to the bottom of the column. Remove the mouthpiece from your  mouth and breathe out normally. Rest for a few seconds and repeat Steps 1 through 7 at least 10 times every 1-2 hours when you are awake. Take your time and take a few normal breaths between deep breaths. The spirometer may include an indicator to show your best effort. Use the indicator as a goal to work toward during each repetition. After each set of 10 deep breaths, practice coughing to be sure your lungs are clear. If you have an incision (the cut made at the time of surgery), support your incision when coughing by placing a pillow or rolled up towels firmly against it. Once you are able to get out of bed, walk around indoors and cough well. You may  stop using the incentive spirometer when instructed by your caregiver.  RISKS AND COMPLICATIONS Take your time so you do not get dizzy or light-headed. If you are in pain, you may need to take or ask for pain medication before doing incentive spirometry. It is harder to take a deep breath if you are having pain. AFTER USE Rest and breathe slowly and easily. It can be helpful to keep track of a log of your progress. Your caregiver can provide you with a simple table to help with this. If you are using the spirometer at home, follow these instructions: [place] IF:  You are having difficultly using the spirometer. You have trouble using the spirometer as often as instructed. Your pain medication is not giving enough relief while using the spirometer. You develop fever of 100.5 F (38.1 C) or higher.                                                                                                    SEEK IMMEDIATE MEDICAL CARE IF:  You cough up bloody sputum that had not been present before. You develop fever of 102 F (38.9 C) or greater. You develop worsening pain at or near the incision site. MAKE SURE YOU:  Understand these instructions. Will watch your condition. Will get help right away if you are not doing well or get  worse. Document Released: 01/03/2007 Document Revised: 11/15/2011 Document Reviewed: 03/06/2007 [facility] Patient Information 2014 [place], [place].

## 2022-06-19 NOTE — Progress Notes (Signed)
COVID Vaccine received:  []  No [x]  Yes Date of any COVID positive Test in last 90 days:  PCP - Tomasa Hose,  MD   [address]   [place], [place] [address]   [phone] (Work)   [phone] (Fax)    Cardiologist - Carlean Purl  Chest x-ray - 01-07-2017  Epic EKG -  07-31-2021  Epic Stress Test - 07-08-2017 Epic ECHO - 02-19-2016  Epic Cardiac Cath - 11-06-2020  L[facility]. Clayborn Bigness at [facility]  Pacemaker/ICD device     []  N/A Spinal Cord Stimulator:[]  No []  Yes      (Remind patient to bring remote DOS) Other Implants:   History of Sleep Apnea? []  No []  Yes   Sleep Study Date:   CPAP used?- []  No []  Yes  (Instruct to bring their mask & Tubing)  Does the patient monitor blood sugar? []  No []  Yes  []  N/A Does patient have a [employer] or Dexacom? []  No []  Yes   Fasting Blood Sugar Ranges-  Checks Blood Sugar _____ times a day  Blood Thinner Instructions:  Eliquis  Aspirin Instructions: Last Dose:  ERAS Protocol Ordered: []  No  [x]  Yes PRE-SURGERY [x]  ENSURE  []  G2   Comments:   Activity level: Patient can / can not climb a flight of stairs without difficulty;  []  No CP  []  No SOB,  but would have ______   Anesthesia review: Afib (Ablation 10-21-2021), HX Retinal Artery Embolism/ Hollenhorst plaques, HTN, BPH  Patient denies shortness of breath, fever, cough and chest pain at PAT appointment.  Patient verbalized understanding and agreement to the Pre-Surgical Instructions that were given to them at this PAT appointment. Patient was also educated of the need to review these PAT instructions again prior to his/her surgery.I reviewed the appropriate phone numbers to call if they have any and questions or concerns.

## 2022-06-22 ENCOUNTER — Encounter (HOSPITAL_COMMUNITY)
Admission: RE | Admit: 2022-06-22 | Discharge: 2022-06-22 | Disposition: A | Discharge status: Home / Self Care | Payer: Medicare Other | Source: Ambulatory Visit | Attending: Orthopedic Surgery | Admitting: Orthopedic Surgery

## 2022-06-22 ENCOUNTER — Other Ambulatory Visit: Payer: Self-pay

## 2022-06-22 ENCOUNTER — Encounter (HOSPITAL_COMMUNITY): Payer: Self-pay

## 2022-06-22 VITALS — BP 154/93 | HR 60 | Temp 98.0°F | Resp 18 | Ht 70.0 in | Wt 240.0 lb

## 2022-06-22 DIAGNOSIS — Z01812 Encounter for preprocedural laboratory examination: Secondary | ICD-10-CM | POA: Diagnosis present

## 2022-06-22 DIAGNOSIS — Z01818 Encounter for other preprocedural examination: Secondary | ICD-10-CM

## 2022-06-22 DIAGNOSIS — I10 Essential (primary) hypertension: Secondary | ICD-10-CM

## 2022-06-22 HISTORY — DX: Gastro-esophageal reflux disease without esophagitis: K21.9

## 2022-06-22 LAB — BASIC METABOLIC PANEL
Anion gap: 6 (ref 5–15)
BUN: 16 mg/dL (ref 8–23)
CO2: 30 mmol/L (ref 22–32)
Calcium: 9 mg/dL (ref 8.9–10.3)
Chloride: 104 mmol/L (ref 98–111)
Creatinine, Ser: 0.73 mg/dL (ref 0.61–1.24)
GFR, Estimated: >60 mL/min (ref >60)
Glucose, Bld: 86 mg/dL (ref 70–99)
Potassium: 3.5 mmol/L (ref 3.5–5.1)
Sodium: 140 mmol/L (ref 135–145)

## 2022-06-22 LAB — SURGICAL PCR SCREEN
MRSA, PCR: NEGATIVE
Staphylococcus aureus: POSITIVE — AB

## 2022-06-22 LAB — CBC
HCT: 42.1 % (ref 39.0–52.0)
Hemoglobin: 13.7 g/dL (ref 13.0–17.0)
MCH: 28.8 pg (ref 26.0–34.0)
MCHC: 32.5 g/dL (ref 30.0–36.0)
MCV: 88.4 fL (ref 80.0–100.0)
Platelets: 252 10*3/uL (ref 150–400)
RBC: 4.76 MIL/uL (ref 4.22–5.81)
RDW: 13.5 % (ref 11.5–15.5)
WBC: 5.4 10*3/uL (ref 4.0–10.5)
nRBC: 0 % (ref 0.0–0.2)

## 2022-06-22 NOTE — Progress Notes (Signed)
Patient's PCR screen is positive for STAPH. Appropriate notes have been placed on the patient's chart. This note has been routed to Dr. Veverly Fells  for review. The Patient's surgery is currently Scheduled [id number] July 05, 2022     Leota Jacobsen, BSN, CVRN-BC   Pre-Surgical Testing Nurse [place]  [phone]

## 2022-06-24 NOTE — Progress Notes (Signed)
Anesthesia Chart Review   Case: [id number] Date/Time: 07/02/22 1220   Procedure: Left REVERSE total SHOULDER ARTHROPLASTY (Left: Shoulder) - 120 min Choice and interscalene block   Anesthesia type: Choice   Pre-op diagnosis: left shoulder osteoarthritis   [facility] 06 / [facility]   Surgeons: Netta Cedars, MD       DISCUSSION:66 y.o. never smoker with h/o HTN, atrial fibrillation s/p ablation, left shoulder OA scheduled for above procedure 07/05/2022 with Dr. Netta Cedars.   Pt last seen by cardiology 12/10/2021. Stable at this visit.   Pt advised to hold Eliquis three days prior to procedure.   Anticipate pt can proceed with planned procedure barring acute status change.   VS: BP (!) 154/93 Comment: right arm sitting  Pulse 60   Temp 36.7 C (Oral)   Resp 18   Ht 5' 10" (1.778 m)   Wt 108.9 kg   SpO2 100%   BMI 34.44 kg/m   PROVIDERS: Donnie Coffin, MD is PCP   Lujean Amel, MD is Cardiologist  LABS: Labs reviewed: Acceptable for surgery. (all labs ordered are listed, but only abnormal results are displayed)  Labs Reviewed  SURGICAL PCR SCREEN - Abnormal; Notable for the following components:      Result Value   Staphylococcus aureus POSITIVE (*)    All other components within normal limits  BASIC METABOLIC PANEL  CBC     IMAGES:   EKG:   CV: Cardiac Cath 11/06/2020 Left heart cath normal Normal coronary arteries   Normal cardiac cath Normal coronaries Normal cardiac pressures Ejection fraction around 55%  Echo 10/20/2021 CONCLUSIONS ------------------------------------------------------------------    1. No thrombus in the left atrial appendage    2. Biatrial enlargement    3. Patent foramen ovale present by color doppler with left to right flow    4. Mild mitral valve regurgitation with VC 0.12, EROA 0.02    5. Left ventricular ejection fraction estimated at 55%    6. Trivial TR, AR, PR    Echo 02/19/2016 Study Conclusions   - Left  ventricle: The cavity size was normal. Wall thickness was    normal. Systolic function was normal. The estimated ejection    fraction was in the range of 55% to 60%. Wall motion was normal;    there were no regional wall motion abnormalities.  - Aortic valve: There was mild regurgitation.  - Mitral valve: No evidence of vegetation.  - Left atrium: No evidence of thrombus in the atrial cavity or    appendage.  - Right atrium: No evidence of thrombus in the atrial cavity or    appendage.  - Atrial septum: There was a very small high secundum atrial septal    defect. Doppler showed a very small bidirectional, but    predominantly left-to-right, atrial level shunt, in the baseline    state. Echo contrast study showed a very small right-to-left    atrial level shunt, only with provocation. There was an atrial    septal aneurysm.  - Tricuspid valve: No evidence of vegetation.  Past Medical History:  Diagnosis Date   Arthritis    Bell's palsy 2010   lasted one day ago, told that he had a ministroke   Dysrhythmia    atrial fib   GERD (gastroesophageal reflux disease)    Headache    Hollenhorst plaque, right eye    Hypertension    followed by Dr. Velta Addison" with [facility]      Past Surgical History:  Procedure Laterality Date   CARDIOVERSION N/A 05/29/2020   Procedure: CARDIOVERSION;  Surgeon: Yolonda Kida, MD;  Location: [facility];  Service: Cardiovascular;  Laterality: N/A;   CIRCUMCISION     done as a child- given inhalation    KNEE CLOSED REDUCTION Left 03/19/2016   Procedure: CLOSED MANIPULATION LEFT KNEE;  Surgeon: Leandrew Koyanagi, MD;  Location: [place];  Service: Orthopedics;  Laterality: Left;   LEFT HEART CATH AND CORONARY ANGIOGRAPHY N/A 11/06/2020   Procedure: LEFT HEART CATH AND CORONARY ANGIOGRAPHY;  Surgeon: Yolonda Kida, MD;  Location: [place] [facility];  Service: Cardiovascular;  Laterality: N/A;   TEE WITHOUT CARDIOVERSION N/A 02/19/2016   Procedure:  TRANSESOPHAGEAL ECHOCARDIOGRAM (TEE);  Surgeon: Sanda Klein, MD;  Location: [facility]OS[facility];  Service: Cardiovascular;  Laterality: N/A;   TOTAL KNEE ARTHROPLASTY Right 05/26/2015   Procedure: RIGHT TOTAL KNEE ARTHROPLASTY;  Surgeon: Leandrew Koyanagi, MD;  Location: [place];  Service: Orthopedics;  Laterality: Right;   TOTAL KNEE ARTHROPLASTY Left 01/01/2016   Procedure: LEFT TOTAL KNEE ARTHROPLASTY;  Surgeon: Leandrew Koyanagi, MD;  Location: [place];  Service: Orthopedics;  Laterality: Left;    MEDICATIONS:  AMBULATORY NON FORMULARY MEDICATION   amiodarone (PACERONE) 200 MG tablet   amLODipine (NORVASC) 5 MG tablet   ELIQUIS 5 MG TABS tablet   EPINEPHrine 0.3 mg/0.3 mL IJ SOAJ injection   ibuprofen (ADVIL) 800 MG tablet   isosorbide mononitrate (IMDUR) 30 MG 24 hr tablet   losartan (COZAAR) 100 MG tablet   lubiprostone (AMITIZA) 8 MCG capsule   metoprolol succinate (TOPROL-XL) 25 MG 24 hr tablet   NARCAN 4 MG/0.1ML LIQD nasal spray kit   nitroGLYCERIN (NITROSTAT) 0.4 MG SL tablet   oxyCODONE-acetaminophen (PERCOCET) 10-325 MG tablet   testosterone cypionate (DEPOTESTOSTERONE CYPIONATE) 200 MG/ML injection   triamcinolone cream (KENALOG) 0.1 %   VIAGRA 100 MG tablet   No current facility-administered medications for this encounter.    Walter Felix Ward, PA-C [facility] Pre-Surgical Testing [phone]

## 2022-06-25 ENCOUNTER — Ambulatory Visit (INDEPENDENT_AMBULATORY_CARE_PROVIDER_SITE_OTHER): Payer: Medicare Other | Admitting: Physician Assistant

## 2022-06-25 DIAGNOSIS — E291 Testicular hypofunction: Secondary | ICD-10-CM

## 2022-06-25 DIAGNOSIS — E349 Endocrine disorder, unspecified: Secondary | ICD-10-CM

## 2022-06-25 MED ORDER — TESTOSTERONE CYPIONATE 200 MG/ML IM SOLN
200.0000 mg | Freq: Once | INTRAMUSCULAR | Status: AC
  Administered 2022-06-25: 200 mg via INTRAMUSCULAR

## 2022-06-25 NOTE — Progress Notes (Signed)
Updated date of surgery:  07/02/22  Updated time of arrival: 10 AM  Updated time for clears and ERAS drink: 930 AM  Update shoulder gel to use: Wednesday 10/25 and Thursday 10/26  Patient denies any changes in allergies, medications, medical history since pre op appointment on:  Pre op instructions reviewed, follow up questions addressed and patient verbalized understanding at this time.

## 2022-06-25 NOTE — Progress Notes (Signed)
Testosterone IM Injection  Due to Hypogonadism patient is present today for a Testosterone Injection.  Medication: Testosterone Cypionate Dose: 0.38ml Location: right upper outer buttocks Lot: [id number] Exp:11/2024  Patient tolerated well, no complications were noted  Performed by: Elberta Leatherwood, CMA  Follow up: 2 weeks

## 2022-07-02 ENCOUNTER — Other Ambulatory Visit: Payer: Self-pay

## 2022-07-02 ENCOUNTER — Ambulatory Visit (HOSPITAL_COMMUNITY): Payer: Medicare Other | Admitting: Physician Assistant

## 2022-07-02 ENCOUNTER — Observation Stay (HOSPITAL_COMMUNITY)
Admission: RE | Admit: 2022-07-02 | Discharge: 2022-07-03 | Disposition: A | Discharge status: Home / Self Care | Payer: Medicare Other | Attending: Orthopedic Surgery | Admitting: Orthopedic Surgery

## 2022-07-02 ENCOUNTER — Encounter (HOSPITAL_COMMUNITY): Payer: Self-pay | Admitting: Orthopedic Surgery

## 2022-07-02 ENCOUNTER — Observation Stay (HOSPITAL_COMMUNITY): Payer: Medicare Other

## 2022-07-02 ENCOUNTER — Ambulatory Visit (HOSPITAL_BASED_OUTPATIENT_CLINIC_OR_DEPARTMENT_OTHER): Payer: Medicare Other | Admitting: Anesthesiology

## 2022-07-02 ENCOUNTER — Encounter (HOSPITAL_COMMUNITY)
Admission: RE | Disposition: A | Discharge status: Home / Self Care | Payer: Self-pay | Source: Home / Self Care | Attending: Orthopedic Surgery

## 2022-07-02 DIAGNOSIS — Z96612 Presence of left artificial shoulder joint: Secondary | ICD-10-CM

## 2022-07-02 DIAGNOSIS — M75122 Complete rotator cuff tear or rupture of left shoulder, not specified as traumatic: Secondary | ICD-10-CM | POA: Diagnosis not present

## 2022-07-02 DIAGNOSIS — Z96653 Presence of artificial knee joint, bilateral: Secondary | ICD-10-CM | POA: Insufficient documentation

## 2022-07-02 DIAGNOSIS — Z79899 Other long term (current) drug therapy: Secondary | ICD-10-CM | POA: Diagnosis not present

## 2022-07-02 DIAGNOSIS — G709 Myoneural disorder, unspecified: Secondary | ICD-10-CM

## 2022-07-02 DIAGNOSIS — I10 Essential (primary) hypertension: Secondary | ICD-10-CM | POA: Insufficient documentation

## 2022-07-02 DIAGNOSIS — Z7901 Long term (current) use of anticoagulants: Secondary | ICD-10-CM | POA: Diagnosis not present

## 2022-07-02 DIAGNOSIS — M19012 Primary osteoarthritis, left shoulder: Secondary | ICD-10-CM | POA: Diagnosis present

## 2022-07-02 DIAGNOSIS — M75102 Unspecified rotator cuff tear or rupture of left shoulder, not specified as traumatic: Secondary | ICD-10-CM

## 2022-07-02 DIAGNOSIS — I4891 Unspecified atrial fibrillation: Secondary | ICD-10-CM | POA: Diagnosis not present

## 2022-07-02 HISTORY — PX: REVERSE SHOULDER ARTHROPLASTY: SHX5054

## 2022-07-02 SURGERY — ARTHROPLASTY, SHOULDER, TOTAL, REVERSE
Anesthesia: General | Site: Shoulder | Laterality: Left

## 2022-07-02 MED ORDER — BUPIVACAINE-EPINEPHRINE (PF) 0.25% -1:200000 IJ SOLN
INTRAMUSCULAR | Status: AC
  Filled 2022-07-02: qty 30

## 2022-07-02 MED ORDER — FENTANYL CITRATE PF 50 MCG/ML IJ SOSY
25.0000 ug | PREFILLED_SYRINGE | INTRAMUSCULAR | Status: DC | PRN
  Administered 2022-07-02 (×2): 50 ug via INTRAVENOUS

## 2022-07-02 MED ORDER — METHOCARBAMOL 500 MG PO TABS
500.0000 mg | ORAL_TABLET | Freq: Four times a day (QID) | ORAL | Status: DC | PRN
  Administered 2022-07-03: 500 mg via ORAL
  Filled 2022-07-02: qty 1

## 2022-07-02 MED ORDER — ONDANSETRON HCL 4 MG/2ML IJ SOLN
4.0000 mg | Freq: Four times a day (QID) | INTRAMUSCULAR | Status: DC | PRN
  Administered 2022-07-02 – 2022-07-03 (×2): 4 mg via INTRAVENOUS
  Filled 2022-07-02 (×2): qty 2

## 2022-07-02 MED ORDER — ACETAMINOPHEN 10 MG/ML IV SOLN
INTRAVENOUS | Status: AC
  Filled 2022-07-02: qty 100

## 2022-07-02 MED ORDER — BUPIVACAINE-EPINEPHRINE (PF) 0.25% -1:200000 IJ SOLN
INTRAMUSCULAR | Status: DC | PRN
  Administered 2022-07-02: 20 mL via PERINEURAL

## 2022-07-02 MED ORDER — FENTANYL CITRATE PF 50 MCG/ML IJ SOSY
PREFILLED_SYRINGE | INTRAMUSCULAR | Status: AC
  Filled 2022-07-02: qty 2

## 2022-07-02 MED ORDER — SODIUM CHLORIDE 0.9 % IR SOLN
Status: DC | PRN
  Administered 2022-07-02: 1000 mL

## 2022-07-02 MED ORDER — METOCLOPRAMIDE HCL 5 MG PO TABS: 5.0000 mg | ORAL_TABLET | Freq: Three times a day (TID) | ORAL | Status: DC | PRN

## 2022-07-02 MED ORDER — ONDANSETRON HCL 4 MG/2ML IJ SOLN
INTRAMUSCULAR | Status: AC
  Filled 2022-07-02: qty 2

## 2022-07-02 MED ORDER — DEXAMETHASONE SODIUM PHOSPHATE 10 MG/ML IJ SOLN
INTRAMUSCULAR | Status: AC
  Filled 2022-07-02: qty 1

## 2022-07-02 MED ORDER — METOCLOPRAMIDE HCL 5 MG/ML IJ SOLN: 5.0000 mg | Freq: Three times a day (TID) | INTRAMUSCULAR | Status: DC | PRN

## 2022-07-02 MED ORDER — ACETAMINOPHEN 325 MG PO TABS: 325.0000 mg | ORAL_TABLET | ORAL | Status: DC | PRN

## 2022-07-02 MED ORDER — ACETAMINOPHEN 10 MG/ML IV SOLN
1000.0000 mg | Freq: Once | INTRAVENOUS | Status: DC | PRN
  Administered 2022-07-02: 1000 mg via INTRAVENOUS

## 2022-07-02 MED ORDER — AMISULPRIDE (ANTIEMETIC) 5 MG/2ML IV SOLN: 10.0000 mg | Freq: Once | INTRAVENOUS | Status: DC | PRN

## 2022-07-02 MED ORDER — TRIAMCINOLONE ACETONIDE 0.1 % EX CREA: 1.0000 | TOPICAL_CREAM | CUTANEOUS | Status: DC | PRN

## 2022-07-02 MED ORDER — SUCCINYLCHOLINE CHLORIDE 200 MG/10ML IV SOSY
PREFILLED_SYRINGE | INTRAVENOUS | Status: AC
  Filled 2022-07-02: qty 10

## 2022-07-02 MED ORDER — PROMETHAZINE HCL 25 MG/ML IJ SOLN: 6.2500 mg | INTRAMUSCULAR | Status: DC | PRN

## 2022-07-02 MED ORDER — OXYCODONE HCL 5 MG PO TABS
5.0000 mg | ORAL_TABLET | Freq: Four times a day (QID) | ORAL | Status: DC | PRN
  Administered 2022-07-02 – 2022-07-03 (×2): 5 mg via ORAL
  Filled 2022-07-02 (×3): qty 1

## 2022-07-02 MED ORDER — FENTANYL CITRATE (PF) 100 MCG/2ML IJ SOLN
INTRAMUSCULAR | Status: DC | PRN
  Administered 2022-07-02 (×2): 50 ug via INTRAVENOUS

## 2022-07-02 MED ORDER — NITROGLYCERIN 0.4 MG SL SUBL: 0.4000 mg | SUBLINGUAL_TABLET | SUBLINGUAL | Status: DC | PRN

## 2022-07-02 MED ORDER — BUPIVACAINE LIPOSOME 1.3 % IJ SUSP
INTRAMUSCULAR | Status: DC | PRN
  Administered 2022-07-02: 10 mL via PERINEURAL

## 2022-07-02 MED ORDER — PHENYLEPHRINE HCL-NACL 20-0.9 MG/250ML-% IV SOLN
INTRAVENOUS | Status: DC | PRN
  Administered 2022-07-02: 25 ug/min via INTRAVENOUS

## 2022-07-02 MED ORDER — OXYCODONE-ACETAMINOPHEN 10-325 MG PO TABS: 1.0000 | ORAL_TABLET | Freq: Four times a day (QID) | ORAL | Status: DC | PRN

## 2022-07-02 MED ORDER — SUCCINYLCHOLINE CHLORIDE 200 MG/10ML IV SOSY
PREFILLED_SYRINGE | INTRAVENOUS | Status: DC | PRN
  Administered 2022-07-02: 120 mg via INTRAVENOUS

## 2022-07-02 MED ORDER — MENTHOL 3 MG MT LOZG: 1.0000 | LOZENGE | OROMUCOSAL | Status: DC | PRN

## 2022-07-02 MED ORDER — FENTANYL CITRATE PF 50 MCG/ML IJ SOSY
50.0000 ug | PREFILLED_SYRINGE | Freq: Once | INTRAMUSCULAR | Status: AC
  Administered 2022-07-02: 50 ug via INTRAVENOUS
  Filled 2022-07-02: qty 2

## 2022-07-02 MED ORDER — PROPOFOL 10 MG/ML IV BOLUS
INTRAVENOUS | Status: DC | PRN
  Administered 2022-07-02: 140 mg via INTRAVENOUS

## 2022-07-02 MED ORDER — LACTATED RINGERS IV SOLN: INTRAVENOUS | Status: DC

## 2022-07-02 MED ORDER — HYDROMORPHONE HCL 1 MG/ML IJ SOLN
0.5000 mg | INTRAMUSCULAR | Status: DC | PRN
  Administered 2022-07-02 – 2022-07-03 (×2): 1 mg via INTRAVENOUS
  Filled 2022-07-02 (×3): qty 1

## 2022-07-02 MED ORDER — METOPROLOL SUCCINATE ER 25 MG PO TB24: 25.0000 mg | ORAL_TABLET | Freq: Every day | ORAL | Status: DC

## 2022-07-02 MED ORDER — DOCUSATE SODIUM 100 MG PO CAPS
100.0000 mg | ORAL_CAPSULE | Freq: Two times a day (BID) | ORAL | Status: DC
  Administered 2022-07-02 – 2022-07-03 (×2): 100 mg via ORAL
  Filled 2022-07-02 (×2): qty 1

## 2022-07-02 MED ORDER — TESTOSTERONE CYPIONATE 200 MG/ML IM SOLN: 100.0000 mg | INTRAMUSCULAR | Status: DC

## 2022-07-02 MED ORDER — CEFAZOLIN SODIUM-DEXTROSE 2-4 GM/100ML-% IV SOLN
2.0000 g | Freq: Four times a day (QID) | INTRAVENOUS | Status: AC
  Administered 2022-07-02 – 2022-07-03 (×3): 2 g via INTRAVENOUS
  Filled 2022-07-02 (×3): qty 100

## 2022-07-02 MED ORDER — LIDOCAINE 2% (20 MG/ML) 5 ML SYRINGE
INTRAMUSCULAR | Status: DC | PRN
  Administered 2022-07-02: 40 mg via INTRAVENOUS

## 2022-07-02 MED ORDER — METHOCARBAMOL 500 MG IVPB - SIMPLE MED
INTRAVENOUS | Status: AC
  Filled 2022-07-02: qty 55

## 2022-07-02 MED ORDER — DEXAMETHASONE SODIUM PHOSPHATE 10 MG/ML IJ SOLN
INTRAMUSCULAR | Status: DC | PRN
  Administered 2022-07-02: 8 mg via INTRAVENOUS

## 2022-07-02 MED ORDER — LOSARTAN POTASSIUM 50 MG PO TABS
100.0000 mg | ORAL_TABLET | Freq: Every day | ORAL | Status: DC
  Administered 2022-07-02 – 2022-07-03 (×2): 100 mg via ORAL
  Filled 2022-07-02 (×2): qty 2

## 2022-07-02 MED ORDER — METHOCARBAMOL 500 MG IVPB - SIMPLE MED
500.0000 mg | Freq: Four times a day (QID) | INTRAVENOUS | Status: DC | PRN
  Administered 2022-07-02: 500 mg via INTRAVENOUS

## 2022-07-02 MED ORDER — TRAZODONE HCL 50 MG PO TABS
50.0000 mg | ORAL_TABLET | Freq: Once | ORAL | Status: AC
  Administered 2022-07-02: 50 mg via ORAL
  Filled 2022-07-02: qty 1

## 2022-07-02 MED ORDER — ONDANSETRON HCL 4 MG PO TABS: 4.0000 mg | ORAL_TABLET | Freq: Three times a day (TID) | ORAL | 1 refills | Status: DC | PRN

## 2022-07-02 MED ORDER — LUBIPROSTONE 8 MCG PO CAPS: 8.0000 ug | ORAL_CAPSULE | Freq: Every day | ORAL | Status: DC

## 2022-07-02 MED ORDER — CHLORHEXIDINE GLUCONATE 0.12 % MT SOLN
15.0000 mL | Freq: Once | OROMUCOSAL | Status: AC
  Administered 2022-07-02: 15 mL via OROMUCOSAL

## 2022-07-02 MED ORDER — OXYCODONE HCL 5 MG PO TABS
5.0000 mg | ORAL_TABLET | Freq: Once | ORAL | Status: AC | PRN
  Administered 2022-07-02: 5 mg via ORAL

## 2022-07-02 MED ORDER — ACETAMINOPHEN 325 MG PO TABS: 325.0000 mg | ORAL_TABLET | Freq: Four times a day (QID) | ORAL | Status: DC | PRN

## 2022-07-02 MED ORDER — LIDOCAINE HCL (PF) 2 % IJ SOLN
INTRAMUSCULAR | Status: AC
  Filled 2022-07-02: qty 5

## 2022-07-02 MED ORDER — POLYETHYLENE GLYCOL 3350 17 G PO PACK: 17.0000 g | PACK | Freq: Every day | ORAL | Status: DC | PRN

## 2022-07-02 MED ORDER — OXYCODONE HCL 5 MG/5ML PO SOLN: 5.0000 mg | Freq: Once | ORAL | Status: AC | PRN

## 2022-07-02 MED ORDER — OXYCODONE-ACETAMINOPHEN 5-325 MG PO TABS
1.0000 | ORAL_TABLET | Freq: Four times a day (QID) | ORAL | Status: DC | PRN
  Administered 2022-07-02 – 2022-07-03 (×2): 1 via ORAL
  Filled 2022-07-02 (×3): qty 1

## 2022-07-02 MED ORDER — ONDANSETRON HCL 4 MG/2ML IJ SOLN
INTRAMUSCULAR | Status: DC | PRN
  Administered 2022-07-02: 4 mg via INTRAVENOUS

## 2022-07-02 MED ORDER — CEFAZOLIN SODIUM-DEXTROSE 2-4 GM/100ML-% IV SOLN
2.0000 g | INTRAVENOUS | Status: AC
  Administered 2022-07-02: 2 g via INTRAVENOUS
  Filled 2022-07-02: qty 100

## 2022-07-02 MED ORDER — EPHEDRINE 5 MG/ML INJ
INTRAVENOUS | Status: AC
  Filled 2022-07-02: qty 5

## 2022-07-02 MED ORDER — SILDENAFIL CITRATE 100 MG PO TABS: 100.0000 mg | ORAL_TABLET | ORAL | Status: DC | PRN

## 2022-07-02 MED ORDER — AMIODARONE HCL 200 MG PO TABS
200.0000 mg | ORAL_TABLET | Freq: Every day | ORAL | Status: DC
  Administered 2022-07-02 – 2022-07-03 (×2): 200 mg via ORAL
  Filled 2022-07-02 (×2): qty 1

## 2022-07-02 MED ORDER — ACETAMINOPHEN 160 MG/5ML PO SOLN: 325.0000 mg | ORAL | Status: DC | PRN

## 2022-07-02 MED ORDER — PHENOL 1.4 % MT LIQD: 1.0000 | OROMUCOSAL | Status: DC | PRN

## 2022-07-02 MED ORDER — PROPOFOL 10 MG/ML IV BOLUS
INTRAVENOUS | Status: AC
  Filled 2022-07-02: qty 20

## 2022-07-02 MED ORDER — AMLODIPINE BESYLATE 5 MG PO TABS
5.0000 mg | ORAL_TABLET | Freq: Every day | ORAL | Status: DC
  Administered 2022-07-02 – 2022-07-03 (×2): 5 mg via ORAL
  Filled 2022-07-02 (×2): qty 1

## 2022-07-02 MED ORDER — STERILE WATER FOR IRRIGATION IR SOLN
Status: DC | PRN
  Administered 2022-07-02: 2000 mL

## 2022-07-02 MED ORDER — FENTANYL CITRATE (PF) 100 MCG/2ML IJ SOLN
INTRAMUSCULAR | Status: AC
  Filled 2022-07-02: qty 2

## 2022-07-02 MED ORDER — OXYCODONE HCL 5 MG PO TABS
ORAL_TABLET | ORAL | Status: AC
  Filled 2022-07-02: qty 1

## 2022-07-02 MED ORDER — APIXABAN 5 MG PO TABS: 5.0000 mg | ORAL_TABLET | Freq: Two times a day (BID) | ORAL | Status: DC

## 2022-07-02 MED ORDER — EPINEPHRINE 0.3 MG/0.3ML IJ SOAJ: 0.3000 mg | INTRAMUSCULAR | Status: DC | PRN

## 2022-07-02 MED ORDER — HYDROMORPHONE HCL 2 MG PO TABS: 2.0000 mg | ORAL_TABLET | ORAL | 0 refills | Status: DC | PRN

## 2022-07-02 MED ORDER — ORAL CARE MOUTH RINSE: 15.0000 mL | Freq: Once | OROMUCOSAL | Status: AC

## 2022-07-02 MED ORDER — ONDANSETRON HCL 4 MG PO TABS: 4.0000 mg | ORAL_TABLET | Freq: Four times a day (QID) | ORAL | Status: DC | PRN

## 2022-07-02 MED ORDER — BUPIVACAINE HCL (PF) 0.5 % IJ SOLN
INTRAMUSCULAR | Status: DC | PRN
  Administered 2022-07-02: 12 mL via PERINEURAL

## 2022-07-02 MED ORDER — IBUPROFEN 400 MG PO TABS: 800.0000 mg | ORAL_TABLET | Freq: Every day | ORAL | Status: DC | PRN

## 2022-07-02 MED ORDER — NALOXONE HCL 4 MG/0.1ML NA LIQD: 1.0000 | NASAL | Status: DC | PRN

## 2022-07-02 MED ORDER — MIDAZOLAM HCL 2 MG/2ML IJ SOLN
1.0000 mg | Freq: Once | INTRAMUSCULAR | Status: AC
  Administered 2022-07-02: 2 mg via INTRAVENOUS
  Filled 2022-07-02: qty 2

## 2022-07-02 MED ORDER — ISOSORBIDE MONONITRATE ER 30 MG PO TB24: 30.0000 mg | ORAL_TABLET | Freq: Every day | ORAL | Status: DC

## 2022-07-02 MED ORDER — METHOCARBAMOL 500 MG PO TABS: 500.0000 mg | ORAL_TABLET | Freq: Four times a day (QID) | ORAL | 1 refills | Status: DC

## 2022-07-02 MED ORDER — SODIUM CHLORIDE 0.9 % IV SOLN: INTRAVENOUS | Status: DC

## 2022-07-02 SURGICAL SUPPLY — 70 items
BAG COUNTER SPONGE SURGICOUNT (BAG) IMPLANT
BAG ZIPLOCK 12X15 (MISCELLANEOUS) IMPLANT
BIT DRILL 1.6MX128 (BIT) IMPLANT
BIT DRILL 170X2.5X (BIT) IMPLANT
BIT DRL 170X2.5X (BIT) ×1
BLADE SAG 18X100X1.27 (BLADE) ×1 IMPLANT
COVER BACK TABLE 60X90IN (DRAPES) ×1 IMPLANT
COVER SURGICAL LIGHT HANDLE (MISCELLANEOUS) ×1 IMPLANT
CUP HUMERAL 42 PLUS 3 (Orthopedic Implant) IMPLANT
DRAPE INCISE IOBAN 66X45 STRL (DRAPES) ×1 IMPLANT
DRAPE ORTHO SPLIT 77X108 STRL (DRAPES) ×2
DRAPE SHEET LG 3/4 BI-LAMINATE (DRAPES) ×1 IMPLANT
DRAPE SURG ORHT 6 SPLT 77X108 (DRAPES) ×2 IMPLANT
DRAPE TOP 10253 STERILE (DRAPES) ×1 IMPLANT
DRAPE U-SHAPE 47X51 STRL (DRAPES) ×1 IMPLANT
DRILL 2.5 (BIT) ×1
DRSG ADAPTIC 3X8 NADH LF (GAUZE/BANDAGES/DRESSINGS) ×1 IMPLANT
DURAPREP 26ML APPLICATOR (WOUND CARE) ×1 IMPLANT
ELECT BLADE TIP CTD 4 INCH (ELECTRODE) ×1 IMPLANT
ELECT NDL TIP 2.8 STRL (NEEDLE) ×1 IMPLANT
ELECT NEEDLE TIP 2.8 STRL (NEEDLE) ×1 IMPLANT
ELECT REM PT RETURN 15FT ADLT (MISCELLANEOUS) ×1 IMPLANT
EPIPHSYSI CENTER SZ 2 LT (Shoulder) ×1 IMPLANT
EPIPHYSIS CENTER SZ 2 LT (Shoulder) IMPLANT
FACESHIELD WRAPAROUND (MASK) ×1 IMPLANT
FACESHIELD WRAPAROUND OR TEAM (MASK) ×1 IMPLANT
GAUZE PAD ABD 8X10 STRL (GAUZE/BANDAGES/DRESSINGS) ×1 IMPLANT
GAUZE SPONGE 4X4 12PLY STRL (GAUZE/BANDAGES/DRESSINGS) ×1 IMPLANT
GLENOSPHERE XTEND LAT 42+0 STD (Miscellaneous) IMPLANT
GLOVE BIOGEL PI IND STRL 7.5 (GLOVE) ×1 IMPLANT
GLOVE BIOGEL PI IND STRL 8.5 (GLOVE) ×1 IMPLANT
GLOVE ORTHO TXT STRL SZ7.5 (GLOVE) ×1 IMPLANT
GLOVE SURG ORTHO 8.5 STRL (GLOVE) ×1 IMPLANT
GOWN STRL REUS W/ TWL XL LVL3 (GOWN DISPOSABLE) ×2 IMPLANT
GOWN STRL REUS W/TWL XL LVL3 (GOWN DISPOSABLE) ×2
KIT BASIN OR (CUSTOM PROCEDURE TRAY) ×1 IMPLANT
KIT TURNOVER KIT A (KITS) IMPLANT
MANIFOLD NEPTUNE II (INSTRUMENTS) ×1 IMPLANT
METAGLENE DELTA EXTEND (Trauma) IMPLANT
METAGLENE DXTEND (Trauma) ×1 IMPLANT
NDL MAYO CATGUT SZ4 TPR NDL (NEEDLE) ×1 IMPLANT
NEEDLE MAYO CATGUT SZ4 (NEEDLE) ×1 IMPLANT
NS IRRIG 1000ML POUR BTL (IV SOLUTION) ×1 IMPLANT
PACK SHOULDER (CUSTOM PROCEDURE TRAY) ×1 IMPLANT
PIN GUIDE 1.2 (PIN) IMPLANT
PIN GUIDE GLENOPHERE 1.5MX300M (PIN) IMPLANT
PIN METAGLENE 2.5 (PIN) IMPLANT
PROTECTOR NERVE ULNAR (MISCELLANEOUS) ×1 IMPLANT
RESTRAINT HEAD UNIVERSAL NS (MISCELLANEOUS) ×1 IMPLANT
SCREW 4.5X18MM (Screw) ×1 IMPLANT
SCREW 4.5X36MM (Screw) IMPLANT
SCREW BN 18X4.5XSTRL SHLDR (Screw) IMPLANT
SLING ARM FOAM STRAP LRG (SOFTGOODS) IMPLANT
SMARTMIX MINI TOWER (MISCELLANEOUS)
SPIKE FLUID TRANSFER (MISCELLANEOUS) ×1 IMPLANT
SPONGE T-LAP 4X18 ~~LOC~~+RFID (SPONGE) ×1 IMPLANT
STEM 12 HA (Stem) IMPLANT
STRIP CLOSURE SKIN 1/2X4 (GAUZE/BANDAGES/DRESSINGS) ×1 IMPLANT
SUCTION FRAZIER HANDLE 10FR (MISCELLANEOUS) ×1
SUCTION TUBE FRAZIER 10FR DISP (MISCELLANEOUS) ×1 IMPLANT
SUT FIBERWIRE #2 38 T-5 BLUE (SUTURE) ×2
SUT MNCRL AB 4-0 PS2 18 (SUTURE) ×1 IMPLANT
SUT VIC AB 0 CT1 36 (SUTURE) ×2 IMPLANT
SUT VIC AB 0 CT2 27 (SUTURE) ×1 IMPLANT
SUT VIC AB 2-0 CT1 27 (SUTURE) ×1
SUT VIC AB 2-0 CT1 TAPERPNT 27 (SUTURE) ×1 IMPLANT
SUTURE FIBERWR #2 38 T-5 BLUE (SUTURE) ×2 IMPLANT
TAPE CLOTH SURG 6X10 WHT LF (GAUZE/BANDAGES/DRESSINGS) IMPLANT
TOWEL OR 17X26 10 PK STRL BLUE (TOWEL DISPOSABLE) ×1 IMPLANT
TOWER SMARTMIX MINI (MISCELLANEOUS) IMPLANT

## 2022-07-02 NOTE — Brief Op Note (Signed)
07/02/2022  2:50 PM  PATIENT:  Walter Hodges  66 y.o. male  PRE-OPERATIVE DIAGNOSIS:  left shoulder osteoarthritis, rotator cuff tears  POST-OPERATIVE DIAGNOSIS:  left shoulder osteoarthritis, rotator cuff tears  PROCEDURE:  Procedure(s) with comments: Left REVERSE total SHOULDER ARTHROPLASTY (Left) - 120 min Choice and interscalene block DePuy Delta Xtend with NO subscap repair  SURGEON:  Surgeon(s) and Role:    Netta Cedars, MD - Primary  PHYSICIAN ASSISTANT:   ASSISTANTS: Ventura Bruns, PA-C   ANESTHESIA:   regional and general  EBL:  250 mL   BLOOD ADMINISTERED:none  DRAINS: none   LOCAL MEDICATIONS USED:  MARCAINE     SPECIMEN:  No Specimen  DISPOSITION OF SPECIMEN:  N/A  COUNTS:  YES  TOURNIQUET:  * No tourniquets in log *  DICTATION: .Other Dictation: Dictation Number [id number]  PLAN OF CARE: Admit for overnight observation  PATIENT DISPOSITION:  PACU - hemodynamically stable.   Delay start of Pharmacological VTE agent (>24hrs) due to surgical blood loss or risk of bleeding: not applicable

## 2022-07-02 NOTE — Transfer of Care (Signed)
Immediate Anesthesia Transfer of Care Note  Patient: TREVONE PRESTWOOD  Procedure(s) Performed: Left REVERSE total SHOULDER ARTHROPLASTY (Left: Shoulder)  Patient Location: PACU  Anesthesia Type:General  Level of Consciousness: drowsy  Airway & Oxygen Therapy: Patient Spontanous Breathing and Patient connected to face mask oxygen  Post-op Assessment: Report given to RN and Post -op Vital signs reviewed and stable  Post vital signs: Reviewed and stable  Last Vitals:  Vitals Value Taken Time  BP 151/70 07/02/22 1440  Temp    Pulse 65 07/02/22 1441  Resp 10 07/02/22 1441  SpO2 100 % 07/02/22 1441  Vitals shown include unvalidated device data.  Last Pain:  Vitals:   07/02/22 1009  TempSrc:   PainSc: 0-No pain         Complications: No notable events documented.

## 2022-07-02 NOTE — Anesthesia Preprocedure Evaluation (Addendum)
Anesthesia Evaluation  Patient identified by MRN, date of birth, ID band Patient awake    Reviewed: Allergy & Precautions, NPO status , Patient's Chart, lab work & pertinent test results  Airway Mallampati: I  TM Distance: >3 FB Neck ROM: Full    Dental  (+) Partial Upper   Pulmonary neg pulmonary ROS,    breath sounds clear to auscultation       Cardiovascular hypertension, + dysrhythmias  Rhythm:Regular Rate:Normal     Neuro/Psych  Headaches,  Neuromuscular disease negative psych ROS   GI/Hepatic Neg liver ROS, GERD  ,  Endo/Other  negative endocrine ROS  Renal/GU negative Renal ROS     Musculoskeletal  (+) Arthritis ,   Abdominal Normal abdominal exam  (+)   Peds  Hematology negative hematology ROS (+)   Anesthesia Other Findings   Reproductive/Obstetrics                            Anesthesia Physical Anesthesia Plan  ASA: 3  Anesthesia Plan: General   Post-op Pain Management: Regional block*   Induction: Intravenous  PONV Risk Score and Plan: 3 and Ondansetron, Dexamethasone and Midazolam  Airway Management Planned: Oral ETT  Additional Equipment: None  Intra-op Plan:   Post-operative Plan: Extubation in OR  Informed Consent: I have reviewed the patients History and Physical, chart, labs and discussed the procedure including the risks, benefits and alternatives for the proposed anesthesia with the patient or authorized representative who has indicated his/her understanding and acceptance.     Dental advisory given  Plan Discussed with: CRNA  Anesthesia Plan Comments:        Anesthesia Quick Evaluation

## 2022-07-02 NOTE — Anesthesia Postprocedure Evaluation (Signed)
Anesthesia Post Note  Patient: Walter Hodges  Procedure(s) Performed: Left REVERSE total SHOULDER ARTHROPLASTY (Left: Shoulder)     Patient location during evaluation: PACU Anesthesia Type: General Level of consciousness: awake and alert Pain management: pain level controlled Vital Signs Assessment: post-procedure vital signs reviewed and stable Respiratory status: spontaneous breathing, nonlabored ventilation, respiratory function stable and patient connected to nasal cannula oxygen Cardiovascular status: blood pressure returned to baseline and stable Postop Assessment: no apparent nausea or vomiting Anesthetic complications: no Comments: No pain.    No notable events documented.  Last Vitals:  Vitals:   07/02/22 1600 07/02/22 1630  BP: (!) 153/83 (!) 148/83  Pulse: 68 68  Resp: 14 16  Temp:  36.6 C  SpO2: 94% 96%    Last Pain:  Vitals:   07/02/22 1630  TempSrc: Oral  PainSc:                  Effie Berkshire

## 2022-07-02 NOTE — Anesthesia Procedure Notes (Signed)
Procedure Name: Intubation Date/Time: 07/02/2022 12:24 PM  Performed by: Niel Hummer, CRNAPre-anesthesia Checklist: Patient identified, Emergency Drugs available, Suction available and Patient being monitored Patient Re-evaluated:Patient Re-evaluated prior to induction Oxygen Delivery Method: Circle system utilized Preoxygenation: Pre-oxygenation with 100% oxygen Induction Type: IV induction Ventilation: Mask ventilation without difficulty Laryngoscope Size: Mac and 4 Grade View: Grade I Tube type: Oral Tube size: 7.5 mm Number of attempts: 1 Airway Equipment and Method: Stylet Placement Confirmation: ETT inserted through vocal cords under direct vision, positive ETCO2 and breath sounds checked- equal and bilateral Secured at: 26 cm Tube secured with: Tape Dental Injury: Teeth and Oropharynx as per pre-operative assessment  Comments: Taped at 26 at the lip

## 2022-07-02 NOTE — Discharge Instructions (Signed)
Ice to the shoulder constantly.  Keep the incision covered and clean and dry for one week, then ok to get it wet in the shower. ° °Do exercise as instructed several times per day. ° °DO NOT reach behind your back or push up out of a chair with the operative arm. ° °Use a sling while you are up and around for comfort, may remove while seated.  Keep pillow propped behind the operative elbow. ° °Follow up with Dr Simrin Vegh in two weeks in the office, call [phone] for appt °

## 2022-07-02 NOTE — Anesthesia Procedure Notes (Signed)
Anesthesia Regional Block: Interscalene brachial plexus block   Pre-Anesthetic Checklist: , timeout performed,  Correct Patient, Correct Site, Correct Laterality,  Correct Procedure, Correct Position, site marked,  Risks and benefits discussed,  Surgical consent,  Pre-op evaluation,  At surgeon's request and post-op pain management  Laterality: Left  Prep: chloraprep       Needles:  Injection technique: Single-shot  Needle Type: Echogenic Stimulator Needle     Needle Length: 9cm  Needle Gauge: 21     Additional Needles:   Procedures:,,,, ultrasound used (permanent image in chart),,    Narrative:  Start time: 07/02/2022 10:00 AM End time: 07/02/2022 10:05 AM Injection made incrementally with aspirations every 5 mL.  Performed by: Personally  Anesthesiologist: Effie Berkshire, MD  Additional Notes: Patient tolerated the procedure well. Local anesthetic introduced in an incremental fashion under minimal resistance after negative aspirations. No paresthesias were elicited. After completion of the procedure, no acute issues were identified and patient continued to be monitored by RN.

## 2022-07-02 NOTE — Interval H&P Note (Signed)
History and Physical Interval Note:  07/02/2022 9:38 AM  Walter Hodges  has presented today for surgery, with the diagnosis of left shoulder osteoarthritis.  The various methods of treatment have been discussed with the patient and family. After consideration of risks, benefits and other options for treatment, the patient has consented to  Procedure(s) with comments: Left REVERSE total SHOULDER ARTHROPLASTY (Left) - 120 min Choice and interscalene block as a surgical intervention.  The patient's history has been reviewed, patient examined, no change in status, stable for surgery.  I have reviewed the patient's chart and labs.  Questions were answered to the patient's satisfaction.     Augustin Schooling

## 2022-07-03 DIAGNOSIS — M19012 Primary osteoarthritis, left shoulder: Secondary | ICD-10-CM | POA: Diagnosis not present

## 2022-07-03 MED ORDER — OXYCODONE-ACETAMINOPHEN 5-325 MG PO TABS: 1.0000 | ORAL_TABLET | Freq: Four times a day (QID) | ORAL | Status: DC | PRN

## 2022-07-03 MED ORDER — OXYCODONE HCL 5 MG PO TABS: 5.0000 mg | ORAL_TABLET | Freq: Four times a day (QID) | ORAL | Status: DC | PRN

## 2022-07-03 MED ORDER — HYDROMORPHONE HCL 2 MG PO TABS: 2.0000 mg | ORAL_TABLET | ORAL | Status: DC | PRN

## 2022-07-03 MED ORDER — DOCUSATE SODIUM 100 MG PO CAPS: 100.0000 mg | ORAL_CAPSULE | Freq: Two times a day (BID) | ORAL | 0 refills | Status: DC | PRN

## 2022-07-03 NOTE — Clinical Social Work Note (Signed)
  Transition of Care [facility]) Screening Note   Patient Details  Name: BOCEPHUS CALI Date of Birth: Oct 29, 1955   Transition of Care [facility]) CM/SW Contact:    Ross Ludwig, LCSW Phone Number: 07/03/2022, 11:23 AM    Transition of Care Department [facility]) has reviewed patient and no TOC needs have been identified at this time. We will continue to monitor patient advancement through interdisciplinary progression rounds. If new patient transition needs arise, please place a TOC consult.

## 2022-07-03 NOTE — Discharge Summary (Signed)
Patient ID: Walter Hodges MRN: [id number] DOB/AGE: 67/30/1957 67 y.o.  Admit date: 07/02/2022 Discharge date: 07/03/2022  Admission Diagnoses:  Principal Problem:   H/O total shoulder replacement, left   Discharge Diagnoses:  Same  Past Medical History:  Diagnosis Date   Arthritis    Bell's palsy 2010   lasted one day ago, told that he had a ministroke   Dysrhythmia    atrial fib   GERD (gastroesophageal reflux disease)    Headache    Hollenhorst plaque, right eye    Hypertension    followed by Dr. Velta Addison" with [facility]      Surgeries: Procedure(s): Left REVERSE total SHOULDER ARTHROPLASTY on 07/02/2022   Consultants:   Discharged Condition: Improved  Hospital Course: Walter Hodges is an 66 y.o. male who was admitted 07/02/2022 for operative treatment ofH/O total shoulder replacement, left. Patient has severe unremitting pain that affects sleep, daily activities, and work/hobbies. After pre-op clearance the patient was taken to the operating room on 07/02/2022 and underwent  Procedure(s): Left REVERSE total SHOULDER ARTHROPLASTY.    Patient was given perioperative antibiotics:  Anti-infectives (From admission, onward)    Start     Dose/Rate Route Frequency Ordered Stop   07/02/22 1800  ceFAZolin (ANCEF) IVPB 2g/100 mL premix        2 g 200 mL/hr over 30 Minutes Intravenous Every 6 hours 07/02/22 1618 07/03/22 0700   07/02/22 0915  ceFAZolin (ANCEF) IVPB 2g/100 mL premix        2 g 200 mL/hr over 30 Minutes Intravenous On call to O.R. 07/02/22 0909 07/02/22 1300        Patient was given sequential compression devices, early ambulation, and chemoprophylaxis to prevent DVT.  Patient benefited maximally from hospital stay and there were no complications.    Recent vital signs: Patient Vitals for the past 24 hrs:  BP Temp Temp src Pulse Resp SpO2 Height Weight  07/03/22 1042 (!) 163/90 97.8 F (36.6 C) Oral 86 18 94 % -- --  07/03/22 0605 (!) 161/84  97.9 F (36.6 C) Oral 79 18 99 % -- --  07/03/22 0148 (!) 169/86 (!) 97.3 F (36.3 C) Oral 96 18 97 % -- --  07/02/22 1956 (!) 166/107 98.3 F (36.8 C) Oral 96 18 98 % -- --  07/02/22 1838 (!) 161/102 97.7 F (36.5 C) -- 85 18 97 % -- --  07/02/22 1630 (!) 148/83 97.8 F (36.6 C) Oral 68 16 96 % _0  (1.778 m) 109 kg  07/02/22 1600 (!) 153/83 -- -- 68 14 94 % -- --  07/02/22 1545 (!) 159/93 (!) 97.5 F (36.4 C) -- 80 15 98 % -- --  07/02/22 1530 (!) 155/79 -- -- 70 12 99 % -- --  07/02/22 1515 (!) 143/81 -- -- 72 19 96 % -- --  07/02/22 1500 125/81 -- -- 74 13 95 % -- --  07/02/22 1445 135/80 -- -- 73 14 100 % -- --  07/02/22 1441 (!) 151/70 97.6 F (36.4 C) -- 72 12 100 % -- --     Recent laboratory studies: No results for input(s): "WBC", "HGB", "HCT", "PLT", "NA", "K", "CL", "CO2", "BUN", "CREATININE", "GLUCOSE", "INR", "CALCIUM" in the last 72 hours.  Invalid input(s): "PT", "2"   Discharge Medications:   Allergies as of 07/03/2022       Reactions   Ace Inhibitors Swelling        Medication List     STOP taking these  medications    ibuprofen 800 MG tablet Commonly known as: ADVIL       TAKE these medications    AMBULATORY NON FORMULARY MEDICATION Trimix (30/1/50)-(Pap/Phent/PGE)  Dosage: Inject 1 cc per injection   Vial 78m  Qty #10 Refills 6  [phone] [phone] changed: additional instructions   amiodarone 200 MG tablet Commonly known as: PACERONE Take 200 mg by mouth daily.   amLODipine 5 MG tablet Commonly known as: NORVASC Take 5 mg by mouth daily.   docusate sodium 100 MG capsule Commonly known as: COLACE Take 1 capsule (100 mg total) by mouth 2 (two) times daily as needed for mild constipation.   Eliquis 5 MG Tabs tablet Generic drug: apixaban Take 5 mg by mouth 2 (two) times daily.   EPINEPHrine 0.3 mg/0.3 mL Soaj injection Commonly known as: EPI-PEN Inject 0.3 mg into the muscle as  needed for anaphylaxis.   HYDROmorphone 2 MG tablet Commonly known as: Dilaudid Take 1 tablet (2 mg total) by mouth every 4 (four) hours as needed for severe pain.   isosorbide mononitrate 30 MG 24 hr tablet Commonly known as: IMDUR Take 30 mg by mouth daily.   losartan 100 MG tablet Commonly known as: COZAAR Take 100 mg by mouth daily.   lubiprostone 8 MCG capsule Commonly known as: AMITIZA Take by mouth.   methocarbamol 500 MG tablet Commonly known as: ROBAXIN Take 1 tablet (500 mg total) by mouth 4 (four) times daily.   metoprolol succinate 25 MG 24 hr tablet Commonly known as: TOPROL-XL Take 25 mg by mouth daily.   Narcan 4 MG/0.1ML Liqd nasal spray kit Generic drug: naloxone Place 1 spray into the nose as needed (overdose).   nitroGLYCERIN 0.4 MG SL tablet Commonly known as: NITROSTAT Place 0.4 mg under the tongue every 5 (five) minutes x 3 doses as needed for chest pain.   ondansetron 4 MG tablet Commonly known as: Zofran Take 1 tablet (4 mg total) by mouth every 8 (eight) hours as needed for nausea, vomiting or refractory nausea / vomiting.   oxyCODONE-acetaminophen 10-325 MG tablet Commonly known as: PERCOCET Take 1 tablet by mouth 5 (five) times daily as needed for pain.   testosterone cypionate 200 MG/ML injection Commonly known as: DEPOTESTOSTERONE CYPIONATE Inject 0.5 mLs (100 mg total) into the muscle every 14 (fourteen) days. What changed: how much to take   triamcinolone cream 0.1 % Commonly known as: KENALOG Apply 1 Application topically as needed (pain).   Viagra 100 MG tablet Generic drug: sildenafil Take 100 mg by mouth as needed for erectile dysfunction.        Diagnostic Studies: DG Shoulder Left Port  Result Date: 07/02/2022 CLINICAL DATA:  Postop left shoulder. EXAM: LEFT SHOULDER COMPARISON:  Left shoulder radiographs 10/08/2018 FINDINGS: Interval reverse total left shoulder arthroplasty. No perihardware lucency is seen to indicate  hardware failure or loosening. Expected postoperative changes including subacromial and lateral proximal humeral subcutaneous air. Moderate acromioclavicular peripheral degenerative osteophytes. No acute fracture or dislocation. The visualized portion of the left lung is unremarkable. Mild-to-moderate calcification within the aortic arch. IMPRESSION: Interval reverse total left shoulder arthroplasty without evidence of hardware failure. Electronically Signed   By: RYvonne KendallM.D.   On: 07/02/2022 15:27    Disposition: Discharge disposition: 01-Home or Self Care       Discharge Instructions     Call MD / Call 911   Complete by: As directed    If you experience chest  pain or shortness of breath, CALL 911 and be transported to the hospital emergency room.  If you develope a fever above 101 F, pus (white drainage) or increased drainage or redness at the wound, or calf pain, call your surgeon's office.   Constipation Prevention   Complete by: As directed    Drink plenty of fluids.  Prune juice may be helpful.  You may use a stool softener, such as Colace (over the counter) 100 mg twice a day.  Use MiraLax (over the counter) for constipation as needed.   Diet - low sodium heart healthy   Complete by: As directed    Increase activity slowly as tolerated   Complete by: As directed    Post-operative opioid taper instructions:   Complete by: As directed    POST-OPERATIVE OPIOID TAPER INSTRUCTIONS: It is important to wean off of your opioid medication as soon as possible. If you do not need pain medication after your surgery it is ok to stop day one. Opioids include: Codeine, Hydrocodone(Norco, Vicodin), Oxycodone(Percocet, oxycontin) and hydromorphone amongst others.  Long term and even short term use of opiods can cause: Increased pain response Dependence Constipation Depression Respiratory depression And more.  Withdrawal symptoms can include Flu like symptoms Nausea, vomiting And  more Techniques to manage these symptoms Hydrate well Eat regular healthy meals Stay active Use relaxation techniques(deep breathing, meditating, yoga) Do Not substitute Alcohol to help with tapering If you have been on opioids for less than two weeks and do not have pain than it is ok to stop all together.  Plan to wean off of opioids This plan should start within one week post op of your joint replacement. Maintain the same interval or time between taking each dose and first decrease the dose.  Cut the total daily intake of opioids by one tablet each day Next start to increase the time between doses. The last dose that should be eliminated is the evening dose.           Follow-up Information     Netta Cedars, MD. Call in 2 week(s).   Specialty: Orthopedic Surgery Why: call [phone] for appt in two weeks Contact information: [address] [place] [address] [phone]                  Signed: Cecilie Kicks 07/03/2022, 1:49 PM

## 2022-07-03 NOTE — Plan of Care (Signed)
Plan of care reviewed and discussed with the patient. 

## 2022-07-03 NOTE — Evaluation (Signed)
Occupational Therapy Evaluation Patient Details Name: Walter Hodges MRN: [id number] DOB: 1955-12-27 Today's Date: 07/03/2022   History of Present Illness Pt is a 66 year old man admitted for L reverse TSA on 10/27. PMH: arthritis, bells palsy,  afib, headaches, HTN, B TKA.   Clinical Impression   Pt seen for evaluation in two separate sessions due to nausea during first. All education completed with pt verbalizing and/or demonstrating understanding. Information reinforced with written handouts.      Recommendations for follow up therapy are one component of a multi-disciplinary discharge planning process, led by the attending physician.  Recommendations may be updated based on patient status, additional functional criteria and insurance authorization.   Follow Up Recommendations  Follow physician's recommendations for discharge plan and follow up therapies    Assistance Recommended at Discharge PRN  Patient can return home with the following A little help with bathing/dressing/bathroom    Functional Status Assessment  Patient has had a recent decline in their functional status and demonstrates the ability to make significant improvements in function in a reasonable and predictable amount of time.  Equipment Recommendations  None recommended by OT    Recommendations for Other Services       Precautions / Restrictions Precautions Precautions: Shoulder Type of Shoulder Precautions: AROM L elbow to hand, no shoulder exercises Shoulder Interventions: Shoulder sling/immobilizer;Off for dressing/bathing/exercises Precaution Booklet Issued: Yes (comment) Required Braces or Orthoses: Sling Restrictions Weight Bearing Restrictions: Yes LUE Weight Bearing: Non weight bearing      Mobility Bed Mobility Overal bed mobility: Modified Independent                  Transfers Overall transfer level: Independent Equipment used: None                      Balance  Overall balance assessment: No apparent balance deficits (not formally assessed)                                         ADL either performed or assessed with clinical judgement   ADL   Eating/Feeding: Set up;Bed level   Grooming: Modified independent;Standing   Upper Body Bathing: Minimal assistance;Standing   Lower Body Bathing: Modified independent;Sit to/from stand   Upper Body Dressing : Minimal assistance;Sitting   Lower Body Dressing: Modified independent;Sit to/from stand   Toilet Transfer: Independent   Toileting- Water quality scientist and Hygiene: Independent       Functional mobility during ADLs: Independent General ADL Comments: Educated pt in positioning L UE in bed and chair, sling use and wearing schedule, compensatory strategies for ADLs, NWB on L UE and AROM exercises L elbow to hand.     Vision Baseline Vision/History: 1 Wears glasses Ability to See in Adequate Light: 0 Adequate       Perception     Praxis      Pertinent Vitals/Pain Pain Assessment Pain Assessment: No/denies pain     Hand Dominance Right   Extremity/Trunk Assessment Upper Extremity Assessment Upper Extremity Assessment: LUE deficits/detail LUE Deficits / Details: s/p reverse TSA, nerve block partially intact LUE Coordination: decreased gross motor   Lower Extremity Assessment Lower Extremity Assessment: Overall WFL for tasks assessed   Cervical / Trunk Assessment Cervical / Trunk Assessment: Normal   Communication Communication Communication: No difficulties   Cognition Arousal/Alertness: Awake/alert Behavior During Therapy: WFL for tasks assessed/performed  Overall Cognitive Status: Within Functional Limits for tasks assessed                                       General Comments       Exercises     Shoulder Instructions      Home Living Family/patient expects to be discharged to:: Private residence Living Arrangements:  Spouse/significant other Available Help at Discharge: Family;Available 24 hours/day Type of Home: House                                  Prior Functioning/Environment Prior Level of Function : Independent/Modified Independent;Driving                        OT Problem List:        OT Treatment/Interventions:      OT Goals(Current goals can be found in the care plan section)    OT Frequency:      Co-evaluation              AM-PAC OT "6 Clicks" Daily Activity     Outcome Measure Help from another person eating meals?: A Little Help from another person taking care of personal grooming?: None Help from another person toileting, which includes using toliet, bedpan, or urinal?: None Help from another person bathing (including washing, rinsing, drying)?: A Little Help from another person to put on and taking off regular upper body clothing?: A Little Help from another person to put on and taking off regular lower body clothing?: None 6 Click Score: 21   End of Session    Activity Tolerance: Patient tolerated treatment well Patient left: in bed;with call bell/phone within reach  OT Visit Diagnosis: Muscle weakness (generalized) (M62.81)                Time: 0981-1914, 7829-5621 OT Time Calculation (min): 16 min Charges:  OT General Charges $OT Visit: 1 Visit OT Evaluation $OT Eval Low Complexity: 1 Low OT Treatments $Self Care/Home Management : 8-22 mins  Cleta Alberts, OTR/L Acute Rehabilitation Services Office: [phone]   Malka So 07/03/2022, 11:02 AM

## 2022-07-03 NOTE — Progress Notes (Addendum)
Subjective: 1 Day Post-Op Procedure(s) (LRB): Left REVERSE total SHOULDER ARTHROPLASTY (Left) Patient reports pain as moderate.   Reports no numbness or tingling in L arm, minimal right after surgery, but still unable to lift the arm on his own. Reports pain 7/10 this AM, 5 hours s/p last dose of pain meds, just received prior to me seeing him. No other c/o.  Objective: Vital signs in last 24 hours: Temp:  [97.3 F (36.3 C)-98.3 F (36.8 C)] 97.9 F (36.6 C) (10/28 0605) Pulse Rate:  [63-96] 79 (10/28 0605) Resp:  [12-21] 18 (10/28 0605) BP: (125-169)/(68-107) 161/84 (10/28 0605) SpO2:  [94 %-100 %] 99 % (10/28 0605) Weight:  [109 kg] 109 kg (10/27 1630)  Intake/Output from previous day: 10/27 0701 - 10/28 0700 In: 2495 [P.O.:640; I.V.:1400; IV Piggyback:455] Out: 1350 [Urine:1100; Blood:250] Intake/Output this shift: No intake/output data recorded.  No results for input(s): "HGB" in the last 72 hours. No results for input(s): "WBC", "RBC", "HCT", "PLT" in the last 72 hours. No results for input(s): "NA", "K", "CL", "CO2", "BUN", "CREATININE", "GLUCOSE", "CALCIUM" in the last 72 hours. No results for input(s): "LABPT", "INR" in the last 72 hours.  Neurologically intact ABD soft Neurovascular intact Sensation intact distally Intact pulses distally Dorsiflexion/Plantar flexion intact Incision: dressing C/D/I and no drainage No cellulitis present Compartment soft No sign of DVT   Assessment/Plan: 1 Day Post-Op Procedure(s) (LRB): Left REVERSE total SHOULDER ARTHROPLASTY (Left) Advance diet Up with therapy D/C IV fluids If passes PT and can get better pain control today pt can D/C to home. Otherwise will need to stay until tomorrow for safe D/C. Pt on Oxy 10 at home. Added Dilaudid PO instead, 2-4mg  as often as q3 hours to obtain better pain control. Dr. Veverly Fells has already sent PO dilaudid for his pain meds on D/C in addition to him continuing his chronic Oxy 10. Orders  updated to reflect this. If pt ready to D/C today please call or text me [phone] for D/C order   Patient's anticipated LOS is less than 2 midnights, meeting these requirements: - Younger than 74 - Lives within 1 hour of care - Has a competent adult at home to recover with post-op recover - NO history of  - Chronic pain requiring opiods  - Diabetes  - Coronary Artery Disease  - Heart failure  - Heart attack  - Stroke  - DVT/VTE  - Cardiac arrhythmia  - Respiratory Failure/COPD  - Renal failure  - Anemia  - Advanced Liver disease     Cecilie Kicks 07/03/2022, 10:12 AM

## 2022-07-03 NOTE — Op Note (Unsigned)
Walter Hodges, ECKENRODE MEDICAL RECORD NO: [id number] ACCOUNT NO: [id number] DATE OF BIRTH: [birth year] FACILITY: [facility] PHYSICIAN: Doran Heater. Veverly Fells, MD  Operative Report   DATE OF PROCEDURE: 07/02/2022  PREOPERATIVE DIAGNOSIS:  Left shoulder end-stage arthritis and rotator cuff tears.  POSTOPERATIVE DIAGNOSIS:  Left shoulder end-stage arthritis and rotator cuff tears.  PROCEDURE PERFORMED:  Left reverse shoulder replacement using DePuy Delta Xtend prosthesis with no subscap repair.  ATTENDING SURGEON:  Doran Heater. Veverly Fells, MD  ASSISTANT:  Charletta Cousin [facility], who was scrubbed during the entire procedure, and necessary for satisfactory completion of surgery.  ANESTHESIA:  General anesthesia was used plus interscalene block.  ESTIMATED BLOOD LOSS:  200 mL  FLUID REPLACEMENT:  1500 mL crystalloid.  COUNTS:  Instrument counts correct.  COMPLICATIONS:  No complications.  ANTIBIOTICS:  Perioperative antibiotics were given.  INDICATIONS:  The patient is a 66 year old male with worsening left shoulder pain and dysfunction secondary to end-stage arthritis.  The patient also has multiple high-grade partial thickness rotator cuff tears.  We discussed options with the patient,  recommending reverse shoulder replacement to eliminate pain and to restore function.  Informed consent obtained.  DESCRIPTION OF PROCEDURE:  After an adequate level of anesthesia was achieved, the patient was positioned in modified beach chair position.  Left shoulder correctly identified and sterilely prepped and draped in the usual manner.  Timeout called,  verifying correct patient, correct site, we entered the patient's shoulder using a standard deltopectoral approach, starting the coracoid process extending down to the anterior humerus.  Dissection down through subcutaneous tissues using Bovie.  Cephalic  vein was identified, taken laterally with the deltoid, pectoralis taken medially.  Conjoined  tendon was identified and retracted medially.  We placed our deep retractors.  I then tenodesed the biceps in situ with 0 Vicryl figure-of-eight suture x2.  We  then released the subscapularis remnant off the lesser tuberosity and tagged for protection of the axillary nerve, we released the inferior capsule extending the shoulder delivering the humeral head out of the wound.  The humerus was devoid of cartilage.   We entered the proximal humerus with a 6 mm reamer, reaming up to a size 12.  We then used the T-handle 12 guide to resect the head at 20 degrees of retroversion with the oscillating saw at 155 degree angle.  Once we had the head resection complete, we  removed excess osteophytes with a rongeur.  We then subluxed the humerus posteriorly, gaining good exposure of the glenoid face.  We removed the capsule and the labrum and the biceps stump.  There was no cartilage remaining on the glenoid face.  We  found our center point for our guide pin, which we placed low on the glenoid.  We then drilled and placed our guide pin.  Next, we reamed for the metaglene baseplate.  We drilled our central peg hole and did our peripheral hand reaming.  We then impacted  the HA coated press-fit baseplate into position, which was nice and inferior on the glenoid.  Next, we placed a 36 screw inferiorly, 36 screw superiorly and 18 posteriorly.  The 36 screws were locked, the 18 was nonlocked.  We had great compression with  the screws and good fixation of the baseplate.  We then selected a 42+0 standard glenosphere and attached that to the baseplate using the screwdriver.  Once that was done, I did a finger sweep to make sure we had no soft tissue caught up in that  bearing.  We went to the humeral side, reamed for the 2 left metaphysis and placed our trial in place, it was a 12 stem with a 2 left metaphysis placed on the 0 setting and impacted in 20 degrees of retroversion.  We reduced the shoulder with a 42+3 poly  had a  nice stable shoulder.  We removed all trial components from the humeral side, irrigated thoroughly and then used available bone graft and impaction grafting technique to press-fit the HA coated 12 stem and a 2 left metaphysis set on the 0 setting  and impacted in 20 degrees of retroversion.  We had nice stable stem.  We selected the real 42+3 poly impacted on the tray, reduced the shoulder, had nice little pop as it reduced.  We irrigated thoroughly and then resected the subscap remnant.  We then  went to the deltopectoral closure with 0 Vicryl suture followed by 2-0 Vicryl for subcutaneous closure and 4-0 Monocryl for skin.  Steri-Strips applied followed by sterile dressing.  The patient tolerated surgery well.     SUJ D: 07/02/2022 2:55:19 pm T: 07/03/2022 12:43:00 am  JOB: [id number]/ [id number]

## 2022-07-05 ENCOUNTER — Encounter (HOSPITAL_COMMUNITY): Payer: Self-pay | Admitting: Orthopedic Surgery

## 2022-07-08 ENCOUNTER — Ambulatory Visit (INDEPENDENT_AMBULATORY_CARE_PROVIDER_SITE_OTHER): Payer: Medicare Other | Admitting: Physician Assistant

## 2022-07-08 ENCOUNTER — Encounter: Payer: Self-pay | Admitting: Physician Assistant

## 2022-07-08 VITALS — BP 154/95 | HR 89 | Ht 70.0 in | Wt 250.8 lb

## 2022-07-08 DIAGNOSIS — E349 Endocrine disorder, unspecified: Secondary | ICD-10-CM

## 2022-07-08 DIAGNOSIS — E291 Testicular hypofunction: Secondary | ICD-10-CM

## 2022-07-08 MED ORDER — TESTOSTERONE CYPIONATE 100 MG/ML IM SOLN: 100.0000 mg | INTRAMUSCULAR | Status: DC

## 2022-07-08 MED ORDER — TESTOSTERONE CYPIONATE 200 MG/ML IM SOLN
200.0000 mg | INTRAMUSCULAR | Status: DC
  Administered 2022-07-08: 100 mg via INTRAMUSCULAR

## 2022-07-08 NOTE — Progress Notes (Signed)
Testosterone IM Injection  Due to Hypogonadism patient is present today for a Testosterone Injection.  Medication: Testosterone Cypionate Dose: 100mg   Location: right upper outer buttocks Lot: [id number]  Patient tolerated well, no complications were noted  Performed by: Eliezer Lofts, CMA  Follow up: 2 weeks

## 2022-07-22 ENCOUNTER — Ambulatory Visit (INDEPENDENT_AMBULATORY_CARE_PROVIDER_SITE_OTHER): Payer: Medicare Other | Admitting: Urology

## 2022-07-22 DIAGNOSIS — E349 Endocrine disorder, unspecified: Secondary | ICD-10-CM

## 2022-07-22 MED ORDER — TESTOSTERONE CYPIONATE 200 MG/ML IM SOLN: 200.0000 mg | Freq: Once | INTRAMUSCULAR | Status: DC

## 2022-07-23 ENCOUNTER — Other Ambulatory Visit: Payer: Self-pay | Admitting: Physician Assistant

## 2022-07-26 ENCOUNTER — Other Ambulatory Visit: Payer: Self-pay | Admitting: *Deleted

## 2022-07-26 DIAGNOSIS — E349 Endocrine disorder, unspecified: Secondary | ICD-10-CM

## 2022-07-26 DIAGNOSIS — N5201 Erectile dysfunction due to arterial insufficiency: Secondary | ICD-10-CM

## 2022-07-26 MED ORDER — AMBULATORY NON FORMULARY MEDICATION: 0 refills | Status: DC

## 2022-07-27 MED ORDER — TESTOSTERONE CYPIONATE 200 MG/ML IM SOLN: 100.0000 mg | INTRAMUSCULAR | 0 refills | Status: DC

## 2022-08-05 NOTE — Progress Notes (Signed)
Appointment canceled.

## 2022-08-09 DIAGNOSIS — M79641 Pain in right hand: Secondary | ICD-10-CM | POA: Insufficient documentation

## 2022-08-20 ENCOUNTER — Encounter: Payer: Self-pay | Admitting: Urology

## 2022-08-20 ENCOUNTER — Ambulatory Visit: Payer: Medicare Other | Admitting: Urology

## 2022-08-26 ENCOUNTER — Ambulatory Visit (INDEPENDENT_AMBULATORY_CARE_PROVIDER_SITE_OTHER): Payer: Medicare Other | Admitting: Urology

## 2022-08-26 DIAGNOSIS — E349 Endocrine disorder, unspecified: Secondary | ICD-10-CM

## 2022-08-26 DIAGNOSIS — E291 Testicular hypofunction: Secondary | ICD-10-CM | POA: Diagnosis not present

## 2022-08-26 MED ORDER — TESTOSTERONE CYPIONATE 200 MG/ML IM SOLN
200.0000 mg | Freq: Once | INTRAMUSCULAR | Status: AC
  Administered 2022-08-26: 200 mg via INTRAMUSCULAR

## 2022-08-26 NOTE — Progress Notes (Signed)
Testosterone IM Injection  Due to Hypogonadism patient is present today for a Testosterone Injection.  Medication: Testosterone Cypionate Dose: 08/26/2022 Location: right upper outer buttocks Lot: [id number] Exp:09/2024  Patient tolerated well, no complications were noted  Performed by: S.Babette Stum, CMA  Follow up: 2 weeks

## 2022-09-09 ENCOUNTER — Ambulatory Visit (INDEPENDENT_AMBULATORY_CARE_PROVIDER_SITE_OTHER): Payer: Medicare Other | Admitting: Urology

## 2022-09-09 DIAGNOSIS — E291 Testicular hypofunction: Secondary | ICD-10-CM | POA: Diagnosis not present

## 2022-09-09 DIAGNOSIS — E349 Endocrine disorder, unspecified: Secondary | ICD-10-CM

## 2022-09-09 MED ORDER — TESTOSTERONE CYPIONATE 200 MG/ML IM SOLN
100.0000 mg | Freq: Once | INTRAMUSCULAR | Status: AC
  Administered 2022-09-09: 100 mg via INTRAMUSCULAR

## 2022-09-09 NOTE — Progress Notes (Addendum)
Patient ID: Walter Hodges, male   DOB: 08/14/1956, 66 y.o.   MRN: [id number] Testosterone IM Injection  Due to Hypogonadism patient is present today for a Testosterone Injection.  Medication: Testosterone Cypionate Dose: 0.5ml Location: right upper outer buttocks Lot: [id number] Exp:090126  Patient tolerated well, no complications were noted  Performed by: Harbert Fitterer, CMA  Follow up: 2 weeks  

## 2022-09-20 ENCOUNTER — Telehealth: Payer: Self-pay | Admitting: Urology

## 2022-09-23 ENCOUNTER — Ambulatory Visit: Payer: Medicare Other | Admitting: Urology

## 2022-09-24 ENCOUNTER — Ambulatory Visit: Payer: Medicare Other | Admitting: Urology

## 2022-09-24 ENCOUNTER — Ambulatory Visit (INDEPENDENT_AMBULATORY_CARE_PROVIDER_SITE_OTHER): Payer: 59 | Admitting: Urology

## 2022-09-24 DIAGNOSIS — E349 Endocrine disorder, unspecified: Secondary | ICD-10-CM

## 2022-09-24 DIAGNOSIS — E291 Testicular hypofunction: Secondary | ICD-10-CM

## 2022-09-24 MED ORDER — TESTOSTERONE CYPIONATE 200 MG/ML IM SOLN
200.0000 mg | Freq: Once | INTRAMUSCULAR | Status: AC
  Administered 2022-09-24: 200 mg via INTRAMUSCULAR

## 2022-09-24 NOTE — Progress Notes (Signed)
Patient ID: Walter Hodges, male   DOB: 21-Nov-1955, 67 y.o.   MRN: [id number] Testosterone IM Injection  Due to Hypogonadism patient is present today for a Testosterone Injection.  Medication: Testosterone Cypionate Dose: 0.77ml Location: right upper outer buttocks Lot: [id number]  Patient tolerated well, no complications were noted  Performed by: Edwin Dada, CMA  Follow up: 2 weeks

## 2022-10-07 ENCOUNTER — Ambulatory Visit: Payer: Medicare Other | Admitting: Urology

## 2022-10-15 ENCOUNTER — Ambulatory Visit (INDEPENDENT_AMBULATORY_CARE_PROVIDER_SITE_OTHER): Payer: 59 | Admitting: Urology

## 2022-10-15 DIAGNOSIS — E349 Endocrine disorder, unspecified: Secondary | ICD-10-CM

## 2022-10-15 DIAGNOSIS — E291 Testicular hypofunction: Secondary | ICD-10-CM

## 2022-10-15 MED ORDER — TESTOSTERONE CYPIONATE 200 MG/ML IM SOLN
100.0000 mg | Freq: Once | INTRAMUSCULAR | Status: AC
  Administered 2022-10-15: 100 mg via INTRAMUSCULAR

## 2022-10-15 NOTE — Progress Notes (Unsigned)
Patient ID: Walter Hodges, male   DOB: November 11, 1955, 67 y.o.   MRN: [id number] Testosterone IM Injection  Due to Hypogonadism patient is present today for a Testosterone Injection.  Medication: Testosterone Cypionate Dose: 0.63m Location: right upper outer buttocks Lot: [id number]:05/07/25  Patient tolerated well, no complications were noted  Performed by: DEdwin Dada CMA/ HMargorie John CMA  Follow up: 2 weeks

## 2022-10-29 ENCOUNTER — Ambulatory Visit (INDEPENDENT_AMBULATORY_CARE_PROVIDER_SITE_OTHER): Payer: 59 | Admitting: Urology

## 2022-10-29 DIAGNOSIS — E291 Testicular hypofunction: Secondary | ICD-10-CM | POA: Diagnosis not present

## 2022-10-29 DIAGNOSIS — E349 Endocrine disorder, unspecified: Secondary | ICD-10-CM

## 2022-10-29 MED ORDER — TESTOSTERONE CYPIONATE 200 MG/ML IM SOLN: 200.0000 mg | Freq: Once | INTRAMUSCULAR | Status: DC

## 2022-10-29 MED ORDER — TESTOSTERONE CYPIONATE 200 MG/ML IM SOLN
100.0000 mg | Freq: Once | INTRAMUSCULAR | Status: AC
  Administered 2022-10-29: 100 mg via INTRAMUSCULAR

## 2022-10-29 NOTE — Progress Notes (Signed)
Testosterone IM Injection  Due to Hypogonadism patient is present today for a Testosterone Injection.  Medication: Testosterone Cypionate Dose: 0.5 ml Location: right upper outer buttocks Lot: [id number] Exp:05/07/2025  Patient tolerated well, no complications were noted  Performed by: Mariam Dollar, CMA  Follow up: 2 weeks

## 2022-11-12 ENCOUNTER — Ambulatory Visit (INDEPENDENT_AMBULATORY_CARE_PROVIDER_SITE_OTHER): Payer: 59 | Admitting: Urology

## 2022-11-12 DIAGNOSIS — E291 Testicular hypofunction: Secondary | ICD-10-CM | POA: Diagnosis not present

## 2022-11-12 DIAGNOSIS — E349 Endocrine disorder, unspecified: Secondary | ICD-10-CM

## 2022-11-12 MED ORDER — TESTOSTERONE CYPIONATE 200 MG/ML IM SOLN
100.0000 mg | Freq: Once | INTRAMUSCULAR | Status: AC
  Administered 2022-11-12: 100 mg via INTRAMUSCULAR

## 2022-11-12 NOTE — Progress Notes (Signed)
Testosterone IM Injection  Due to Hypogonadism patient is present today for a Testosterone Injection.  Medication: Testosterone Cypionate Dose: 0.5 ml Location: right upper outer buttocks Lot: [id number] Exp:05/07/2025  Patient tolerated well, no complications were noted  Performed by: Mariam Dollar, RMA  Follow up: March 26th 2024

## 2022-11-19 ENCOUNTER — Other Ambulatory Visit: Payer: 59

## 2022-11-19 DIAGNOSIS — N138 Other obstructive and reflux uropathy: Secondary | ICD-10-CM

## 2022-11-19 DIAGNOSIS — E349 Endocrine disorder, unspecified: Secondary | ICD-10-CM

## 2022-11-20 LAB — TESTOSTERONE: Testosterone: 755 ng/dL (ref 264–916)

## 2022-11-20 LAB — HEMOGLOBIN AND HEMATOCRIT, BLOOD
Hematocrit: 39 % (ref 37.5–51.0)
Hemoglobin: 13.3 g/dL (ref 13.0–17.7)

## 2022-11-20 LAB — PSA: Prostate Specific Ag, Serum: 1.5 ng/mL (ref 0.0–4.0)

## 2022-11-29 NOTE — Progress Notes (Unsigned)
11/30/22 8:54 AM   [name] [birth year] [id number]  Referring provider:  Donnie Coffin, MD [place],  [place] [id number]  Urological history: 1. Testosterone deficiency -contributing factors of age and obesity -testosterone (11/2022) 755 -hemoglobin/hematocrit (11/2022) 13.3/39.0 -testosterone cypionate 200 mg/mL, 0.5 cc every 14 days   2. ED -contributing factors of age, HTN, BPH, testosterone deficiency and obesity -failed PDE5i's -Trimix (30/1/50) 1 cc prior to intercourse    3. BPH with LU TS - PSA (11/2022) 1.5  HPI: Walter Hodges is a 67 y.o.male who presents today for follow up.    He is currently receiving testosterone cypionate injections every 2 weeks through our office.  He has not had any issues with the injections.    I PSS 5/2  He has nocturia x 2-3.  Patient denies any modifying or aggravating factors.  Patient denies any gross hematuria, dysuria or suprapubic/flank pain.  Patient denies any fevers, chills, nausea or vomiting.  This is not bothersome to him.   IPSS     Row Name 11/30/22 0800         International Prostate Symptom Score   How often have you had the sensation of not emptying your bladder? Not at All     How often have you had to urinate less than every two hours? Less than half the time     How often have you found you stopped and started again several times when you urinated? Not at All     How often have you found it difficult to postpone urination? Not at All     How often have you had a weak urinary stream? Not at All     How often have you had to strain to start urination? Not at All     How many times did you typically get up at night to urinate? 3 Times     Total IPSS Score 5       Quality of Life due to urinary symptoms   If you were to spend the rest of your life with your urinary condition just the way it is now how would you feel about that? Mostly Satisfied                Score:  1-7  Mild 8-19 Moderate 20-35 Severe  SHIM 11  Patient is not having spontaneous erections.  He denies any pain or curvature with erections.   The Trimix is working well for him.    SHIM     Row Name 11/30/22 0830         SHIM: Over the last 6 months:   How do you rate your confidence that you could get and keep an erection? Low     When you had erections with sexual stimulation, how often were your erections hard enough for penetration (entering your partner)? A Few Times (much less than half the time)     During sexual intercourse, how often were you able to maintain your erection after you had penetrated (entered) your partner? A Few Times (much less than half the time)     During sexual intercourse, how difficult was it to maintain your erection to completion of intercourse? Difficult     When you attempted sexual intercourse, how often was it satisfactory for you? A Few Times (much less than half the time)       SHIM Total Score   SHIM 11  Score: 1-7 Severe ED 8-11 Moderate ED 12-16 Mild-Moderate ED 17-21 Mild ED 22-25 No ED   PMH: Past Medical History:  Diagnosis Date   Arthritis    Bell's palsy 2010   lasted one day ago, told that he had a ministroke   Dysrhythmia    atrial fib   GERD (gastroesophageal reflux disease)    Headache    Hollenhorst plaque, right eye    Hypertension    followed by Dr. Velta Addison" with [facility]      Surgical History: Past Surgical History:  Procedure Laterality Date   CARDIOVERSION N/A 05/29/2020   Procedure: CARDIOVERSION;  Surgeon: Yolonda Kida, MD;  Location: [facility];  Service: Cardiovascular;  Laterality: N/A;   CIRCUMCISION     done as a child- given inhalation    KNEE CLOSED REDUCTION Left 03/19/2016   Procedure: CLOSED MANIPULATION LEFT KNEE;  Surgeon: Leandrew Koyanagi, MD;  Location: [place];  Service: Orthopedics;  Laterality: Left;   LEFT HEART CATH AND CORONARY ANGIOGRAPHY N/A 11/06/2020    Procedure: LEFT HEART CATH AND CORONARY ANGIOGRAPHY;  Surgeon: Yolonda Kida, MD;  Location: [place] [facility];  Service: Cardiovascular;  Laterality: N/A;   REVERSE SHOULDER ARTHROPLASTY Left 07/02/2022   Procedure: Left REVERSE total SHOULDER ARTHROPLASTY;  Surgeon: Netta Cedars, MD;  Location: [facility];  Service: Orthopedics;  Laterality: Left;  120 min Choice and interscalene block   TEE WITHOUT CARDIOVERSION N/A 02/19/2016   Procedure: TRANSESOPHAGEAL ECHOCARDIOGRAM (TEE);  Surgeon: Sanda Klein, MD;  Location: [facility];  Service: Cardiovascular;  Laterality: N/A;   TOTAL KNEE ARTHROPLASTY Right 05/26/2015   Procedure: RIGHT TOTAL KNEE ARTHROPLASTY;  Surgeon: Leandrew Koyanagi, MD;  Location: [place];  Service: Orthopedics;  Laterality: Right;   TOTAL KNEE ARTHROPLASTY Left 01/01/2016   Procedure: LEFT TOTAL KNEE ARTHROPLASTY;  Surgeon: Leandrew Koyanagi, MD;  Location: [place];  Service: Orthopedics;  Laterality: Left;    Home Medications:  Allergies as of 11/30/2022       Reactions   Ace Inhibitors Swelling        Medication List        Accurate as of November 30, 2022  8:54 AM. If you have any questions, ask your nurse or doctor.          AMBULATORY NON FORMULARY MEDICATION Trimix (30/1/50)-(Pap/Phent/PGE)  Dosage: Inject 1 cc per injection   Vial 38ml  Qty #10 Refills 6  [place] [phone] Fax [phone]   amiodarone 200 MG tablet Commonly known as: PACERONE Take 200 mg by mouth daily.   amLODipine 5 MG tablet Commonly known as: NORVASC Take 5 mg by mouth daily.   docusate sodium 100 MG capsule Commonly known as: COLACE Take 1 capsule (100 mg total) by mouth 2 (two) times daily as needed for mild constipation.   Eliquis 5 MG Tabs tablet Generic drug: apixaban Take 5 mg by mouth 2 (two) times daily.   EPINEPHrine 0.3 mg/0.3 mL Soaj injection Commonly known as: EPI-PEN Inject 0.3 mg into the muscle as needed for anaphylaxis.    HYDROmorphone 2 MG tablet Commonly known as: Dilaudid Take 1 tablet (2 mg total) by mouth every 4 (four) hours as needed for severe pain.   ibuprofen 800 MG tablet Commonly known as: ADVIL Take 800 mg by mouth 3 (three) times daily.   indomethacin 50 MG capsule Commonly known as: INDOCIN Take by mouth.   isosorbide mononitrate 30 MG 24 hr tablet Commonly known as: IMDUR Take  30 mg by mouth daily.   losartan 100 MG tablet Commonly known as: COZAAR Take 100 mg by mouth daily.   lubiprostone 8 MCG capsule Commonly known as: AMITIZA Take by mouth.   methocarbamol 500 MG tablet Commonly known as: ROBAXIN Take 1 tablet (500 mg total) by mouth 4 (four) times daily.   metoprolol succinate 25 MG 24 hr tablet Commonly known as: TOPROL-XL Take 25 mg by mouth daily.   Narcan 4 MG/0.1ML Liqd nasal spray kit Generic drug: naloxone Place 1 spray into the nose as needed (overdose).   nitroGLYCERIN 0.4 MG SL tablet Commonly known as: NITROSTAT Place 0.4 mg under the tongue every 5 (five) minutes x 3 doses as needed for chest pain.   ondansetron 4 MG tablet Commonly known as: Zofran Take 1 tablet (4 mg total) by mouth every 8 (eight) hours as needed for nausea, vomiting or refractory nausea / vomiting.   oxyCODONE-acetaminophen 10-325 MG tablet Commonly known as: PERCOCET Take 1 tablet by mouth 5 (five) times daily as needed for pain.   testosterone cypionate 200 MG/ML injection Commonly known as: DEPOTESTOSTERONE CYPIONATE Inject 0.5 mLs (100 mg total) into the muscle every 14 (fourteen) days.   triamcinolone cream 0.1 % Commonly known as: KENALOG Apply 1 Application topically as needed (pain).   Viagra 100 MG tablet Generic drug: sildenafil Take 100 mg by mouth as needed for erectile dysfunction.        Allergies:  Allergies  Allergen Reactions   Ace Inhibitors Swelling    Family History: Family History  Problem Relation Age of Onset   Headache Neg Hx      Social History:  reports that he has never smoked. He has never used smokeless tobacco. He reports that he does not drink alcohol and does not use drugs.   Physical Exam: BP (!) 141/86   Pulse 65   Ht 5\' 10"  (1.778 m)   Wt 243 lb 12.8 oz (110.6 kg)   BMI 34.98 kg/m   Constitutional:  Well nourished. Alert and oriented, No acute distress. HEENT: [place] AT, moist mucus membranes.  Trachea midline Cardiovascular: No clubbing, cyanosis, or edema. Respiratory: Normal respiratory effort, no increased work of breathing. Neurologic: Grossly intact, no focal deficits, moving all 4 extremities. Psychiatric: Normal mood and affect.   Laboratory Data: Results for orders placed or performed in visit on 11/19/22  PSA  Result Value Ref Range   Prostate Specific Ag, Serum 1.5 0.0 - 4.0 ng/mL  Testosterone  Result Value Ref Range   Testosterone 755 264 - 916 ng/dL  Hemoglobin and hematocrit, blood  Result Value Ref Range   Hemoglobin 13.3 13.0 - 17.7 g/dL   Hematocrit 39.0 37.5 - 51.0 %  I have reviewed the labs.   Pertinent imaging: N/A  Assessment & Plan:    1. Testosterone deficiency  -Testosterone level is at therapeutic range -continue testosterone cypionate 200 mg/mL, 0.5 cc every 14 days    2. BPH with LUTS -PSA stable   3. Erectile dysfunction:    -continue Trimix (30/1/50)-1 cc prn for intercourse -refill sent  Return for schedule next testosterone injection.  Marjan Rosman, [place] [address], [place], [place] [address] [phone]

## 2022-11-30 ENCOUNTER — Encounter: Payer: Self-pay | Admitting: Urology

## 2022-11-30 ENCOUNTER — Ambulatory Visit (INDEPENDENT_AMBULATORY_CARE_PROVIDER_SITE_OTHER): Payer: 59 | Admitting: Urology

## 2022-11-30 VITALS — BP 141/86 | HR 65 | Ht 70.0 in | Wt 243.8 lb

## 2022-11-30 DIAGNOSIS — N5201 Erectile dysfunction due to arterial insufficiency: Secondary | ICD-10-CM

## 2022-11-30 DIAGNOSIS — N138 Other obstructive and reflux uropathy: Secondary | ICD-10-CM

## 2022-11-30 DIAGNOSIS — N401 Enlarged prostate with lower urinary tract symptoms: Secondary | ICD-10-CM | POA: Diagnosis not present

## 2022-11-30 DIAGNOSIS — E291 Testicular hypofunction: Secondary | ICD-10-CM

## 2022-11-30 MED ORDER — AMBULATORY NON FORMULARY MEDICATION: 0 refills | Status: DC

## 2022-12-09 ENCOUNTER — Ambulatory Visit: Payer: 59 | Admitting: Urology

## 2022-12-10 ENCOUNTER — Encounter: Payer: Self-pay | Admitting: Urology

## 2022-12-23 ENCOUNTER — Ambulatory Visit (INDEPENDENT_AMBULATORY_CARE_PROVIDER_SITE_OTHER): Payer: 59 | Admitting: Urology

## 2022-12-23 DIAGNOSIS — E349 Endocrine disorder, unspecified: Secondary | ICD-10-CM

## 2022-12-26 NOTE — Progress Notes (Signed)
Patient did not show for appointment.   

## 2022-12-27 ENCOUNTER — Encounter: Payer: Self-pay | Admitting: Physician Assistant

## 2022-12-27 ENCOUNTER — Ambulatory Visit: Payer: 59 | Admitting: Physician Assistant

## 2023-01-07 ENCOUNTER — Ambulatory Visit: Payer: 59 | Admitting: Physician Assistant

## 2023-01-11 ENCOUNTER — Ambulatory Visit: Payer: 59 | Admitting: Physician Assistant

## 2023-01-21 ENCOUNTER — Ambulatory Visit: Payer: 59 | Admitting: Urology

## 2023-01-21 ENCOUNTER — Ambulatory Visit (INDEPENDENT_AMBULATORY_CARE_PROVIDER_SITE_OTHER): Payer: 59 | Admitting: Urology

## 2023-01-21 DIAGNOSIS — E291 Testicular hypofunction: Secondary | ICD-10-CM

## 2023-01-21 DIAGNOSIS — E349 Endocrine disorder, unspecified: Secondary | ICD-10-CM

## 2023-01-21 MED ORDER — TESTOSTERONE CYPIONATE 200 MG/ML IM SOLN
200.0000 mg | Freq: Once | INTRAMUSCULAR | Status: AC
  Administered 2023-01-21: 200 mg via INTRAMUSCULAR

## 2023-01-21 NOTE — Progress Notes (Signed)
Testosterone IM Injection  Due to Hypogonadism patient is present today for a Testosterone Injection.  Medication: Testosterone Cypionate Dose: 0.5 ml Location: right upper outer buttocks Lot: [id number] Exp:05/07/2025  Patient tolerated well, no complications were noted  Performed by: Randa Lynn, RMA  Follow up: 2 week follow up

## 2023-02-04 ENCOUNTER — Encounter: Payer: Self-pay | Admitting: Urology

## 2023-02-04 ENCOUNTER — Ambulatory Visit (INDEPENDENT_AMBULATORY_CARE_PROVIDER_SITE_OTHER): Payer: 59 | Admitting: Urology

## 2023-02-04 DIAGNOSIS — E349 Endocrine disorder, unspecified: Secondary | ICD-10-CM

## 2023-02-04 DIAGNOSIS — E291 Testicular hypofunction: Secondary | ICD-10-CM

## 2023-02-04 MED ORDER — TESTOSTERONE CYPIONATE 200 MG/ML IM SOLN
200.0000 mg | Freq: Once | INTRAMUSCULAR | Status: AC
  Administered 2023-02-04: 200 mg via INTRAMUSCULAR

## 2023-02-04 NOTE — Progress Notes (Unsigned)
Testosterone IM Injection  Due to Hypogonadism patient is present today for a Testosterone Injection.  Medication: Testosterone Cypionate Dose: 200 mg/ml   0.5 ml given Location: right upper outer buttocks Lot: [id number] Exp:06/05/2025  Patient tolerated well, no complications were noted  Performed by: August Luz, RN   Follow up: 2 weeks for testosterone injection

## 2023-02-18 ENCOUNTER — Ambulatory Visit (INDEPENDENT_AMBULATORY_CARE_PROVIDER_SITE_OTHER): Payer: 59 | Admitting: Physician Assistant

## 2023-02-18 ENCOUNTER — Ambulatory Visit: Payer: 59

## 2023-02-18 DIAGNOSIS — E291 Testicular hypofunction: Secondary | ICD-10-CM | POA: Diagnosis not present

## 2023-02-18 DIAGNOSIS — E349 Endocrine disorder, unspecified: Secondary | ICD-10-CM

## 2023-02-18 MED ORDER — TESTOSTERONE CYPIONATE 200 MG/ML IM SOLN
200.0000 mg | Freq: Once | INTRAMUSCULAR | Status: AC
  Administered 2023-02-18: 200 mg via INTRAMUSCULAR

## 2023-02-18 NOTE — Progress Notes (Addendum)
Testosterone IM Injection  Due to Hypogonadism patient is present today for a Testosterone Injection.  Medication: Testosterone Cypionate Dose: 0.5 ml (100mg ) Location: right upper outer buttocks Lot: [id number] Exp:05/07/2025  Patient tolerated well, no complications were noted  Performed by: Karrah Mangini Magallom-Mariche, RMA  Follow up: 2 weeks

## 2023-02-23 ENCOUNTER — Telehealth: Payer: Self-pay | Admitting: Urology

## 2023-02-23 DIAGNOSIS — E349 Endocrine disorder, unspecified: Secondary | ICD-10-CM

## 2023-02-23 NOTE — Telephone Encounter (Signed)
Pt would like a refill for testosterone sent to pharmacy.

## 2023-02-24 MED ORDER — TESTOSTERONE CYPIONATE 200 MG/ML IM SOLN: 100.0000 mg | INTRAMUSCULAR | 0 refills | Status: DC

## 2023-02-24 NOTE — Telephone Encounter (Signed)
Please schedule him for follow-up visit with Carollee Herter in 3 months with testosterone, H&H, and PSA prior.  Testosterone refilled.

## 2023-03-04 ENCOUNTER — Ambulatory Visit: Payer: 59 | Admitting: Physician Assistant

## 2023-03-18 ENCOUNTER — Ambulatory Visit (INDEPENDENT_AMBULATORY_CARE_PROVIDER_SITE_OTHER): Payer: 59 | Admitting: Physician Assistant

## 2023-03-18 DIAGNOSIS — E291 Testicular hypofunction: Secondary | ICD-10-CM | POA: Diagnosis not present

## 2023-03-18 MED ORDER — TESTOSTERONE CYPIONATE 200 MG/ML IM SOLN
200.0000 mg | Freq: Once | INTRAMUSCULAR | Status: AC
Start: 2023-03-18 — End: 2023-03-18
  Administered 2023-03-18: 200 mg via INTRAMUSCULAR

## 2023-03-18 NOTE — Progress Notes (Signed)
Testosterone IM Injection  Due to Hypogonadism patient is present today for a Testosterone Injection.  Medication: Testosterone Cypionate Dose: 100mg /0.21ml Location: right upper outer buttocks Lot: [id number] Exp:08/2024  Patient tolerated well, no complications were noted  Performed by: [place] Pink RMA  Follow up: F/UP next week as scheduled

## 2023-03-24 ENCOUNTER — Telehealth: Payer: Self-pay

## 2023-03-24 NOTE — Telephone Encounter (Signed)
[facility] rep Francesca Jewett) called in to get the patient schedule for his colonoscopy. The patient told the rep and his provider that he has not had a bowel moment in 2 weeks, and he is still eating normally.

## 2023-03-29 NOTE — Telephone Encounter (Signed)
Patient called to schedule an office appointment.

## 2023-04-01 ENCOUNTER — Ambulatory Visit: Payer: 59 | Admitting: Physician Assistant

## 2023-04-01 DIAGNOSIS — E291 Testicular hypofunction: Secondary | ICD-10-CM

## 2023-04-01 MED ORDER — TESTOSTERONE CYPIONATE 200 MG/ML IM SOLN
100.0000 mg | Freq: Once | INTRAMUSCULAR | Status: AC
Start: 2023-04-01 — End: 2023-04-01
  Administered 2023-04-01: 100 mg via INTRAMUSCULAR

## 2023-04-01 NOTE — Progress Notes (Signed)
Testosterone IM Injection  Due to Hypogonadism patient is present today for a Testosterone Injection.  Medication: Testosterone Cypionate Dose: 0.5 mL Location: right upper outer buttocks Lot: [id number] Exp:08/06/2024  Patient tolerated well, no complications were noted  Performed by: Randa Lynn, RMA  Follow up: 2 weeks

## 2023-04-06 ENCOUNTER — Telehealth: Payer: Self-pay | Admitting: Physician Assistant

## 2023-04-06 NOTE — Telephone Encounter (Signed)
[facility] ([facility]) called to make sure we received the referral.

## 2023-04-15 ENCOUNTER — Ambulatory Visit: Payer: 59 | Admitting: Physician Assistant

## 2023-04-15 ENCOUNTER — Encounter: Payer: Self-pay | Admitting: Physician Assistant

## 2023-05-05 ENCOUNTER — Ambulatory Visit: Payer: 59 | Admitting: Physician Assistant

## 2023-05-10 ENCOUNTER — Telehealth: Payer: Self-pay

## 2023-05-10 NOTE — Telephone Encounter (Signed)
pt left message on triage to reschedule last week's testosterone injection appointment. last injection was given 04/01/2023 and has no showed/cancelled since then. should I go ahead and reschedule injection appointment and lab appointment too? Please advise

## 2023-05-10 NOTE — Telephone Encounter (Signed)
Pt states he will keep this following appointments for 05/19/2023 and 05/26/2023.

## 2023-05-13 ENCOUNTER — Other Ambulatory Visit: Payer: Self-pay

## 2023-05-16 NOTE — Progress Notes (Signed)
Walter Amy, PA-C [address]  [address]  [place], [place] [address]  Main: [phone]  Fax: [phone]   Gastroenterology Consultation  Referring Provider:     Emogene Morgan, MD Primary Care Physician:  Emogene Morgan, MD Primary Gastroenterologist:  Walter Amy, PA-C / Dr. Wyline Mood   Reason for Consultation:     Chronic constipation; history of colon polyps        HPI:   Walter Hodges is a 67 y.o. y/o male referred for consultation & management  by Emogene Morgan, MD.    Patient is referred to schedule a repeat colonoscopy.  He has history of adenomatous colon polyps.  Also has history of chronic constipation.  He has tried OTC MiraLAX and prescription Amitiza which did not help.  He has not tried Linzess.  Currently takes OTC laxatives and herbal tea for constipation.  He denies rectal bleeding, abdominal pain, weight loss, heartburn, or dysphagia.  No family history of colon cancer.  Last colonoscopy 08/2017 at [facility] in [place] showed fair prep and a 3 mm polyp removed from transverse colon.  Nonbleeding Grade 1 internal hemorrhoids.  5-year repeat.  Colonoscopy 06/2017 at [facility] showed very poor prep and medium internal hemorrhoids.  Two 6 to 8 mm polyps removed from ascending colon.  He is having chronic constipation.  No bowel movement for 2 weeks.   Currently takes Colace stool softener and Amitiza 8 mcg twice daily.  Chronic opioid use.  Medical history significant for atrial fibrillation, currently on Eliquis.  Cardioversion in 2021.    Past Medical History:  Diagnosis Date   Arthritis    Bell's palsy 2010   lasted one day ago, told that he had a ministroke   Dysrhythmia    atrial fib   GERD (gastroesophageal reflux disease)    Headache    Hollenhorst plaque, right eye    Hypertension    followed by Dr. Nita Sells" with [facility]      Past Surgical History:  Procedure Laterality Date   CARDIOVERSION N/A 05/29/2020   Procedure: CARDIOVERSION;   Surgeon: Alwyn Pea, MD;  Location: [facility] ORS;  Service: Cardiovascular;  Laterality: N/A;   CIRCUMCISION     done as a child- given inhalation    KNEE CLOSED REDUCTION Left 03/19/2016   Procedure: CLOSED MANIPULATION LEFT KNEE;  Surgeon: Tarry Kos, MD;  [facility]: [facility] OR;  Service: Orthopedics;  Laterality: Left;   LEFT HEART CATH AND CORONARY ANGIOGRAPHY N/A 11/06/2020   Procedure: LEFT HEART CATH AND CORONARY ANGIOGRAPHY;  Surgeon: Alwyn Pea, MD;  Location: [facility] INVASIVE CV LAB;  Service: Cardiovascular;  Laterality: N/A;   REVERSE SHOULDER ARTHROPLASTY Left 07/02/2022   Procedure: Left REVERSE total SHOULDER ARTHROPLASTY;  Surgeon: Beverely Low, MD;  Location: [facility] ORS;  Service: Orthopedics;  Laterality: Left;  120 min Choice and interscalene block   TEE WITHOUT CARDIOVERSION N/A 02/19/2016   Procedure: TRANSESOPHAGEAL ECHOCARDIOGRAM (TEE);  Surgeon: Thurmon Fair, MD;  Location: [facility] ENDOSCOPY;  Service: Cardiovascular;  Laterality: N/A;   TOTAL KNEE ARTHROPLASTY Right 05/26/2015   Procedure: RIGHT TOTAL KNEE ARTHROPLASTY;  Surgeon: Tarry Kos, MD;  [facility]: [facility] OR;  Service: Orthopedics;  Laterality: Right;   TOTAL KNEE ARTHROPLASTY Left 01/01/2016   Procedure: LEFT TOTAL KNEE ARTHROPLASTY;  Surgeon: Tarry Kos, MD;  [facility]: [facility] OR;  Service: Orthopedics;  Laterality: Left;    Prior to Admission medications   Medication Sig Start Date End Date Taking? Authorizing  Provider  AMBULATORY NON FORMULARY MEDICATION Trimix (30/1/50)-(Pap/Phent/PGE)  Dosage: Inject 1 cc per injection   Vial 1ml  Qty #10 Refills 6  [facility] [phone] Fax [phone] 11/30/22   Michiel Cowboy A, PA-C  amiodarone (PACERONE) 200 MG tablet Take 200 mg by mouth daily. 03/07/22   [provider]  amLODipine (NORVASC) 5 MG tablet Take 5 mg by mouth daily. 06/15/21   [provider]  docusate sodium (COLACE) 100 MG capsule Take 1 capsule (100 mg total) by  mouth 2 (two) times daily as needed for mild constipation. 07/03/22   Dorothy Spark, PA-C  ELIQUIS 5 MG TABS tablet Take 5 mg by mouth 2 (two) times daily. 01/22/20   [provider]  EPINEPHrine 0.3 mg/0.3 mL IJ SOAJ injection Inject 0.3 mg into the muscle as needed for anaphylaxis. 07/29/21   Gilles Chiquito, MD  HYDROmorphone (DILAUDID) 2 MG tablet Take 1 tablet (2 mg total) by mouth every 4 (four) hours as needed for severe pain. 07/02/22   Beverely Low, MD  ibuprofen (ADVIL) 800 MG tablet Take 800 mg by mouth 3 (three) times daily. 11/17/22   [provider]  indomethacin (INDOCIN) 50 MG capsule Take by mouth.    [provider]  isosorbide mononitrate (IMDUR) 30 MG 24 hr tablet Take 30 mg by mouth daily. 04/24/21   [provider]  losartan (COZAAR) 100 MG tablet Take 100 mg by mouth daily. 03/17/22   [provider]  lubiprostone (AMITIZA) 8 MCG capsule Take by mouth. 04/08/22   [provider]  methocarbamol (ROBAXIN) 500 MG tablet Take 1 tablet (500 mg total) by mouth 4 (four) times daily. 07/02/22   Beverely Low, MD  metoprolol succinate (TOPROL-XL) 25 MG 24 hr tablet Take 25 mg by mouth daily. 04/24/21   [provider]  NARCAN 4 MG/0.1ML LIQD nasal spray kit Place 1 spray into the nose as needed (overdose). 06/30/20   [provider]  nitroGLYCERIN (NITROSTAT) 0.4 MG SL tablet Place 0.4 mg under the tongue every 5 (five) minutes x 3 doses as needed for chest pain. 10/30/20   [provider]  ondansetron (ZOFRAN) 4 MG tablet Take 1 tablet (4 mg total) by mouth every 8 (eight) hours as needed for nausea, vomiting or refractory nausea / vomiting. 07/02/22 07/02/23  Beverely Low, MD  oxyCODONE-acetaminophen (PERCOCET) 10-325 MG tablet Take 1 tablet by mouth 5 (five) times daily as needed for pain. 11/13/21   [provider]  testosterone cypionate (DEPOTESTOSTERONE CYPIONATE) 200 MG/ML injection Inject 0.5  mLs (100 mg total) into the muscle every 14 (fourteen) days. 02/24/23   Vaillancourt, Lelon Mast, PA-C  triamcinolone cream (KENALOG) 0.1 % Apply 1 Application topically as needed (pain).    [provider]    Family History  Problem Relation Age of Onset   Headache Neg Hx      Social History   Tobacco Use   Smoking status: Never   Smokeless tobacco: Never  Vaping Use   Vaping status: Never Used  Substance Use Topics   Alcohol use: No    Comment: Socially in the past   Drug use: No    Allergies as of 05/17/2023 - Review Complete 05/17/2023  Allergen Reaction Noted   Ace inhibitors Swelling 07/29/2021    Review of Systems:    All systems reviewed and negative except where noted in HPI.   Physical Exam:  BP (!) 177/64   Pulse 73   Temp 97.9  F (36.6 C)   Ht 5\' 10"  (1.778 m)   Wt 242 lb 3.2 oz (109.9 kg)   BMI 34.75 kg/m  No LMP for male patient.  General:   Alert,  Well-developed, well-nourished, pleasant and cooperative in NAD Lungs:  Respirations even and unlabored.  Clear throughout to auscultation.   No wheezes, crackles, or rhonchi. No acute distress. Heart:  Regular rate and rhythm; no murmurs, clicks, rubs, or gallops. Abdomen:  Normal bowel sounds.  No bruits.  Soft, and non-distended without masses, hepatosplenomegaly or hernias noted.  No Tenderness.  No guarding or rebound tenderness.    Neurologic:  Alert and oriented x3;  grossly normal neurologically. Psych:  Alert and cooperative. Normal mood and affect.  Imaging Studies: No results found.  Assessment and Plan:   CALIJAH VANDERHOEF is a 67 y.o. y/o male has been referred for chronic constipation and history of adenomatous colon polyps.  He is overdue for a 5-year repeat colonoscopy.  1.  Chronic constipation  I gave samples and prescription for Linzess 290 mcg 1 capsule once daily.  Continue high-fiber diet and drink 64 ounces of water daily.  Continue herbal tea.  If Linzess does not work,  consider trial of Movantik, Compt[employer] or [employer].  2.  History of adenomatous colon polyps  Scheduling Colonoscopy I discussed risks of colonoscopy with patient to include risk of bleeding, colon perforation, and risk of sedation.  Patient expressed understanding and agrees to proceed with colonoscopy.   2-day prep; Sutab day #1 (sample given); GoLytely day #2.  3.  History of atrial fibrillation (In normal sinus rhythm today)  Requesting permission to hold Eliquis prior to colonoscopy procedure.  Follow up as needed based on colonoscopy results and GI symptoms.  Walter Amy, PA-C

## 2023-05-17 ENCOUNTER — Ambulatory Visit (INDEPENDENT_AMBULATORY_CARE_PROVIDER_SITE_OTHER): Payer: 59 | Admitting: Physician Assistant

## 2023-05-17 ENCOUNTER — Encounter: Payer: Self-pay | Admitting: Physician Assistant

## 2023-05-17 VITALS — BP 180/76 | HR 73 | Temp 97.9°F | Ht 70.0 in | Wt 242.2 lb

## 2023-05-17 DIAGNOSIS — K5909 Other constipation: Secondary | ICD-10-CM

## 2023-05-17 DIAGNOSIS — Z8679 Personal history of other diseases of the circulatory system: Secondary | ICD-10-CM

## 2023-05-17 DIAGNOSIS — Z86010 Personal history of colonic polyps: Secondary | ICD-10-CM

## 2023-05-17 DIAGNOSIS — K5904 Chronic idiopathic constipation: Secondary | ICD-10-CM

## 2023-05-17 MED ORDER — LINACLOTIDE 290 MCG PO CAPS: 290.0000 ug | ORAL_CAPSULE | Freq: Every day | ORAL | 5 refills | Status: AC

## 2023-05-17 MED ORDER — PEG 3350-KCL-NA BICARB-NACL 420 G PO SOLR: 4000.0000 mL | Freq: Once | ORAL | 0 refills | Status: AC

## 2023-05-17 NOTE — Patient Instructions (Signed)
Start Linzess 1 capsule once daily for Constipation.

## 2023-05-19 ENCOUNTER — Ambulatory Visit: Payer: 59 | Admitting: Physician Assistant

## 2023-05-23 ENCOUNTER — Encounter: Payer: Self-pay | Admitting: Physician Assistant

## 2023-05-23 ENCOUNTER — Other Ambulatory Visit: Payer: Self-pay

## 2023-05-23 DIAGNOSIS — E291 Testicular hypofunction: Secondary | ICD-10-CM

## 2023-05-25 ENCOUNTER — Telehealth: Payer: Self-pay

## 2023-05-25 NOTE — Telephone Encounter (Signed)
  Left detailed message on patient;s VM letting him know when to stop and restart the Eliquis. Received Eliquis clearance from Dr.Callwood-patient cleared to have procedure-stop the Eliquis 3 days prior and restart 2 after procedure. Colonoscopy scheduled 06-23-23.

## 2023-05-26 ENCOUNTER — Other Ambulatory Visit: Payer: 59

## 2023-05-26 DIAGNOSIS — E291 Testicular hypofunction: Secondary | ICD-10-CM

## 2023-05-27 LAB — TESTOSTERONE: Testosterone: 553 ng/dL (ref 264–916)

## 2023-05-27 LAB — HEMOGLOBIN AND HEMATOCRIT, BLOOD
Hematocrit: 41.1 % (ref 37.5–51.0)
Hemoglobin: 13.3 g/dL (ref 13.0–17.7)

## 2023-05-27 LAB — PSA: Prostate Specific Ag, Serum: 0.9 ng/mL (ref 0.0–4.0)

## 2023-06-02 ENCOUNTER — Ambulatory Visit: Payer: 59 | Admitting: Urology

## 2023-06-03 ENCOUNTER — Telehealth: Payer: Self-pay

## 2023-06-03 ENCOUNTER — Ambulatory Visit (INDEPENDENT_AMBULATORY_CARE_PROVIDER_SITE_OTHER): Payer: 59 | Admitting: Urology

## 2023-06-03 ENCOUNTER — Ambulatory Visit: Payer: 59 | Admitting: Urology

## 2023-06-03 ENCOUNTER — Other Ambulatory Visit: Payer: 59

## 2023-06-03 DIAGNOSIS — E291 Testicular hypofunction: Secondary | ICD-10-CM | POA: Diagnosis not present

## 2023-06-03 MED ORDER — TESTOSTERONE CYPIONATE 200 MG/ML IM SOLN
100.0000 mg | Freq: Once | INTRAMUSCULAR | Status: AC
Start: 2023-06-03 — End: 2023-06-03
  Administered 2023-06-03: 100 mg via INTRAMUSCULAR

## 2023-06-03 NOTE — Telephone Encounter (Signed)
Patient would like to reschedule Colonoscopy-R/S for 07-07-23- also received eliquis Clearance from Dr Juliann Pares -patient may hold Eliquis 3 days prior to procedure.spoke with Rosann Auerbach to move patient to 07-07-23

## 2023-06-03 NOTE — Progress Notes (Signed)
Testosterone IM Injection  Due to Hypogonadism patient is present today for a Testosterone Injection.  Medication: Testosterone Cypionate Dose: 100mg  Location: right upper outer buttocks Lot: [id number] Exp:08/2024  Patient tolerated well, no complications were noted.  Performed by: Debbe Bales, CMA   Follow up: RTC as scheduled.

## 2023-06-07 NOTE — Progress Notes (Signed)
06/12/23 7:37 PM   [name] Dec 22, 1955 [id number]  Referring provider:  Emogene Morgan, MD [address] [place],  [place] [address]  Urological history: 1. Testosterone deficiency -contributing factors of age and obesity -testosterone (05/2023) 553 -hemoglobin/hematocrit (05/2023) 13.3/41.1 -testosterone cypionate 200 mg/mL, 0.5 cc every 14 days   2. ED -contributing factors of age, HTN, BPH, testosterone deficiency and obesity -failed PDE5i's -Trimix (30/1/50) 1 cc prior to intercourse    3. BPH with LU TS - PSA (05/2023) 0.9  Chief Complaint  Patient presents with   Follow-up    HPI: Walter Hodges is a 67 y.o.male who presents today for follow up.    Previous records reviewed.    He feels the testosterone cypionate injections are working for him.  He has nocturia x 3, which is stable.   Patient denies any modifying or aggravating factors.  Patient denies any recent UTI's, gross hematuria, dysuria or suprapubic/flank pain.  Patient denies any fevers, chills, nausea or vomiting.     IPSS     Row Name 06/08/23 0900         International Prostate Symptom Score   How often have you had the sensation of not emptying your bladder? Less than 1 in 5     How often have you had to urinate less than every two hours? About half the time     How often have you found you stopped and started again several times when you urinated? Not at All     How often have you found it difficult to postpone urination? Not at All     How often have you had a weak urinary stream? Less than 1 in 5 times     How often have you had to strain to start urination? Not at All     How many times did you typically get up at night to urinate? 3 Times     Total IPSS Score 8       Quality of Life due to urinary symptoms   If you were to spend the rest of your life with your urinary condition just the way it is now how would you feel about that? Mostly Satisfied               Score:  1-7 Mild 8-19 Moderate 20-35 Severe  Patient is not having spontaneous erections.  He denies any pain or curvature with erections.    Trimix works well for him.   SHIM     Row Name 06/08/23 8469         SHIM: Over the last 6 months:   How do you rate your confidence that you could get and keep an erection? Moderate     When you had erections with sexual stimulation, how often were your erections hard enough for penetration (entering your partner)? Most Times (much more than half the time)     During sexual intercourse, how often were you able to maintain your erection after you had penetrated (entered) your partner? Sometimes (about half the time)     During sexual intercourse, how difficult was it to maintain your erection to completion of intercourse? Slightly Difficult     When you attempted sexual intercourse, how often was it satisfactory for you? Sometimes (about half the time)       SHIM Total Score   SHIM 17             Score: 1-7 Severe ED  8-11 Moderate ED 12-16 Mild-Moderate ED 17-21 Mild ED 22-25 No ED   PMH: Past Medical History:  Diagnosis Date   Arthritis    Bell's palsy 2010   lasted one day ago, told that he had a ministroke   Dysrhythmia    atrial fib   GERD (gastroesophageal reflux disease)    Headache    Hollenhorst plaque, right eye    Hypertension    followed by Dr. Nita Sells" with [facility]      Surgical History: Past Surgical History:  Procedure Laterality Date   CARDIOVERSION N/A 05/29/2020   Procedure: CARDIOVERSION;  Surgeon: Alwyn Pea, MD;  Location: [facility] ORS;  Service: Cardiovascular;  Laterality: N/A;   CIRCUMCISION     done as a child- given inhalation    KNEE CLOSED REDUCTION Left 03/19/2016   Procedure: CLOSED MANIPULATION LEFT KNEE;  Surgeon: Tarry Kos, MD;  Location: [facility] OR;  Service: Orthopedics;  Laterality: Left;   LEFT HEART CATH AND CORONARY ANGIOGRAPHY N/A 11/06/2020   Procedure: LEFT HEART  CATH AND CORONARY ANGIOGRAPHY;  Surgeon: Alwyn Pea, MD;  Location: [facility] INVASIVE CV LAB;  Service: Cardiovascular;  Laterality: N/A;   REVERSE SHOULDER ARTHROPLASTY Left 07/02/2022   Procedure: Left REVERSE total SHOULDER ARTHROPLASTY;  Surgeon: Beverely Low, MD;  Location: [facility] ORS;  Service: Orthopedics;  Laterality: Left;  120 min Choice and interscalene block   TEE WITHOUT CARDIOVERSION N/A 02/19/2016   Procedure: TRANSESOPHAGEAL ECHOCARDIOGRAM (TEE);  Surgeon: Thurmon Fair, MD;  Location: [facility] ENDOSCOPY;  Service: Cardiovascular;  Laterality: N/A;   TOTAL KNEE ARTHROPLASTY Right 05/26/2015   Procedure: RIGHT TOTAL KNEE ARTHROPLASTY;  Surgeon: Tarry Kos, MD;  Location: [facility] OR;  Service: Orthopedics;  Laterality: Right;   TOTAL KNEE ARTHROPLASTY Left 01/01/2016   Procedure: LEFT TOTAL KNEE ARTHROPLASTY;  Surgeon: Tarry Kos, MD;  Location: [facility] OR;  Service: Orthopedics;  Laterality: Left;    Home Medications:  Allergies as of 06/08/2023       Reactions   Ace Inhibitors Swelling        Medication List        Accurate as of June 08, 2023 11:59 PM. If you have any questions, ask your nurse or doctor.          STOP taking these medications    losartan 100 MG tablet Commonly known as: COZAAR Stopped by: Michiel Cowboy   oxyCODONE-acetaminophen 10-325 MG tablet Commonly known as: PERCOCET Stopped by: Tailey Top       TAKE these medications    AMBULATORY NON FORMULARY MEDICATION Trimix (30/1/50)-(Pap/Phent/PGE)  Dosage: Inject 1 cc per injection   Vial 1ml  Qty #10 Refills 6  [facility] [phone] Fax [phone]   amiodarone 200 MG tablet Commonly known as: PACERONE Take 200 mg by mouth daily.   amLODipine 5 MG tablet Commonly known as: NORVASC Take 5 mg by mouth daily.   docusate sodium 100 MG capsule Commonly known as: COLACE Take 1 capsule (100 mg total) by mouth 2 (two) times daily as needed for mild constipation.    Eliquis 5 MG Tabs tablet Generic drug: apixaban Take 5 mg by mouth 2 (two) times daily.   EPINEPHrine 0.3 mg/0.3 mL Soaj injection Commonly known as: EPI-PEN Inject 0.3 mg into the muscle as needed for anaphylaxis.   ibuprofen 800 MG tablet Commonly known as: ADVIL Take 800 mg by mouth 3 (three) times daily.   linaclotide 290 MCG Caps capsule Commonly known as: Linzess Take 1 capsule (  290 mcg total) by mouth daily before breakfast.   metoprolol succinate 25 MG 24 hr tablet Commonly known as: TOPROL-XL Take 25 mg by mouth daily.   nitroGLYCERIN 0.4 MG SL tablet Commonly known as: NITROSTAT Place 0.4 mg under the tongue every 5 (five) minutes x 3 doses as needed for chest pain.   testosterone cypionate 200 MG/ML injection Commonly known as: DEPOTESTOSTERONE CYPIONATE Inject 0.5 mLs (100 mg total) into the muscle every 14 (fourteen) days.   triamcinolone cream 0.1 % Commonly known as: KENALOG Apply 1 Application topically as needed (pain).        Allergies:  Allergies  Allergen Reactions   Ace Inhibitors Swelling    Family History: Family History  Problem Relation Age of Onset   Headache Neg Hx     Social History:  reports that he has never smoked. He has never used smokeless tobacco. He reports that he does not drink alcohol and does not use drugs.   Physical Exam: BP 119/75   Pulse 72   Ht 5\' 10"  (1.778 m)   Wt 242 lb (109.8 kg)   BMI 34.72 kg/m   Constitutional:  Well nourished. Alert and oriented, No acute distress. HEENT: [place] AT, moist mucus membranes.  Trachea midline Cardiovascular: No clubbing, cyanosis, or edema. Respiratory: Normal respiratory effort, no increased work of breathing. Neurologic: Grossly intact, no focal deficits, moving all 4 extremities. Psychiatric: Normal mood and affect.   Laboratory Data: Results for orders placed or performed in visit on 05/26/23  PSA  Result Value Ref Range   Prostate Specific Ag, Serum 0.9 0.0 - 4.0  ng/mL  Testosterone  Result Value Ref Range   Testosterone 553 264 - 916 ng/dL  Hemoglobin and Hematocrit, Blood  Result Value Ref Range   Hemoglobin 13.3 13.0 - 17.7 g/dL   Hematocrit 13.0 86.5 - 51.0 %  I have reviewed the labs.   Pertinent imaging: N/A  Assessment & Plan:    1. Testosterone deficiency  -Testosterone level is at therapeutic range -Hemoglobin/hematocrit within normal limits -continue testosterone cypionate 200 mg/mL, 0.5 cc every 14 days    2. BPH with LU TS -moderate symptoms, but overall satisfied  -PSA stable   3. Erectile dysfunction:    -mild symptoms -continue Trimix (30/1/50)-1 cc prn for intercourse  Return in about 1 week (around 06/15/2023) for testosterone injection .  Will need testosterone, PSA, hemoglobin and hematocrit labs in 6 months  Michiel Cowboy, PA-C   [facility] [address], [address] [place], [place] [address] [phone]

## 2023-06-08 ENCOUNTER — Ambulatory Visit: Payer: 59 | Admitting: Urology

## 2023-06-08 ENCOUNTER — Encounter: Payer: Self-pay | Admitting: Urology

## 2023-06-08 VITALS — BP 119/75 | HR 72 | Ht 70.0 in | Wt 242.0 lb

## 2023-06-08 DIAGNOSIS — N401 Enlarged prostate with lower urinary tract symptoms: Secondary | ICD-10-CM

## 2023-06-08 DIAGNOSIS — N5201 Erectile dysfunction due to arterial insufficiency: Secondary | ICD-10-CM

## 2023-06-08 DIAGNOSIS — E291 Testicular hypofunction: Secondary | ICD-10-CM | POA: Diagnosis not present

## 2023-06-08 DIAGNOSIS — N138 Other obstructive and reflux uropathy: Secondary | ICD-10-CM

## 2023-06-08 MED ORDER — AMBULATORY NON FORMULARY MEDICATION: 0 refills | Status: AC

## 2023-06-12 NOTE — Progress Notes (Signed)
Error

## 2023-06-17 ENCOUNTER — Ambulatory Visit: Payer: 59 | Admitting: Urology

## 2023-06-22 ENCOUNTER — Ambulatory Visit: Payer: 59 | Admitting: Urology

## 2023-06-24 ENCOUNTER — Ambulatory Visit (INDEPENDENT_AMBULATORY_CARE_PROVIDER_SITE_OTHER): Payer: 59 | Admitting: Urology

## 2023-06-24 DIAGNOSIS — E291 Testicular hypofunction: Secondary | ICD-10-CM | POA: Diagnosis not present

## 2023-06-24 MED ORDER — TESTOSTERONE CYPIONATE 200 MG/ML IM SOLN
100.0000 mg | Freq: Once | INTRAMUSCULAR | Status: AC
Start: 2023-06-24 — End: 2023-06-24
  Administered 2023-06-24: 100 mg via INTRAMUSCULAR

## 2023-06-24 NOTE — Progress Notes (Signed)
Testosterone IM Injection  Due to Hypogonadism patient is present today for a Testosterone Injection.  Medication: Testosterone Cypionate Dose: 0.20ml/100mg  Location: right upper outer buttocks Lot: [id number] Exp: 08/2024  Patient tolerated well, no complications were noted.  Performed by: Debbe Bales, CMA   Follow up: RTC in 2 weeks for injection.

## 2023-07-07 ENCOUNTER — Ambulatory Visit: Payer: 59 | Admitting: Anesthesiology

## 2023-07-07 ENCOUNTER — Encounter
Admission: RE | Disposition: A | Discharge status: Home / Self Care | Payer: Self-pay | Source: Home / Self Care | Attending: Gastroenterology

## 2023-07-07 ENCOUNTER — Ambulatory Visit
Admission: RE | Admit: 2023-07-07 | Discharge: 2023-07-07 | Disposition: A | Discharge status: Home / Self Care | Payer: 59 | Attending: Gastroenterology | Admitting: Gastroenterology

## 2023-07-07 ENCOUNTER — Encounter: Payer: Self-pay | Admitting: Gastroenterology

## 2023-07-07 DIAGNOSIS — Z79899 Other long term (current) drug therapy: Secondary | ICD-10-CM | POA: Insufficient documentation

## 2023-07-07 DIAGNOSIS — D120 Benign neoplasm of cecum: Secondary | ICD-10-CM | POA: Insufficient documentation

## 2023-07-07 DIAGNOSIS — K219 Gastro-esophageal reflux disease without esophagitis: Secondary | ICD-10-CM | POA: Insufficient documentation

## 2023-07-07 DIAGNOSIS — Z860100 Personal history of colon polyps, unspecified: Secondary | ICD-10-CM

## 2023-07-07 DIAGNOSIS — I10 Essential (primary) hypertension: Secondary | ICD-10-CM | POA: Diagnosis not present

## 2023-07-07 DIAGNOSIS — D126 Benign neoplasm of colon, unspecified: Secondary | ICD-10-CM

## 2023-07-07 DIAGNOSIS — Z1211 Encounter for screening for malignant neoplasm of colon: Secondary | ICD-10-CM | POA: Diagnosis present

## 2023-07-07 DIAGNOSIS — K5904 Chronic idiopathic constipation: Secondary | ICD-10-CM

## 2023-07-07 DIAGNOSIS — Z6834 Body mass index (BMI) 34.0-34.9, adult: Secondary | ICD-10-CM | POA: Diagnosis not present

## 2023-07-07 DIAGNOSIS — Z7901 Long term (current) use of anticoagulants: Secondary | ICD-10-CM | POA: Diagnosis not present

## 2023-07-07 DIAGNOSIS — R519 Headache, unspecified: Secondary | ICD-10-CM | POA: Insufficient documentation

## 2023-07-07 DIAGNOSIS — I4891 Unspecified atrial fibrillation: Secondary | ICD-10-CM | POA: Diagnosis not present

## 2023-07-07 DIAGNOSIS — E669 Obesity, unspecified: Secondary | ICD-10-CM | POA: Insufficient documentation

## 2023-07-07 HISTORY — PX: HOT HEMOSTASIS: SHX5433

## 2023-07-07 HISTORY — PX: COLONOSCOPY WITH PROPOFOL: SHX5780

## 2023-07-07 HISTORY — PX: POLYPECTOMY: SHX5525

## 2023-07-07 SURGERY — COLONOSCOPY WITH PROPOFOL
Anesthesia: General

## 2023-07-07 MED ORDER — SODIUM CHLORIDE 0.9 % IV SOLN: INTRAVENOUS | Status: DC

## 2023-07-07 MED ORDER — LIDOCAINE HCL (CARDIAC) PF 100 MG/5ML IV SOSY
PREFILLED_SYRINGE | INTRAVENOUS | Status: DC | PRN
  Administered 2023-07-07: 80 mg via INTRAVENOUS

## 2023-07-07 MED ORDER — PROPOFOL 10 MG/ML IV BOLUS
INTRAVENOUS | Status: DC | PRN
  Administered 2023-07-07: 75 ug/kg/min via INTRAVENOUS

## 2023-07-07 NOTE — Op Note (Signed)
[facility] Gastroenterology Patient Name: Walter Hodges Procedure Date: 07/07/2023 9:54 AM MRN: [id number] Account #: [id number] Date of Birth: August 16, 1956 Admit Type: Outpatient Age: 67 Room: [facility] ENDO ROOM 2 Gender: Male Note Status: Finalized Instrument Name: Prentice Docker [id number] Procedure:             Colonoscopy Indications:           Screening for colorectal malignant neoplasm Providers:             Wyline Mood MD, MD Referring MD:          Sylvie Farrier. Aycock MD (Referring MD) Medicines:             Monitored Anesthesia Care Complications:         No immediate complications. Procedure:             Pre-Anesthesia Assessment:                        - Prior to the procedure, a History and Physical was                         performed, and patient medications, allergies and                         sensitivities were reviewed. The patient's tolerance                         of previous anesthesia was reviewed.                        - The risks and benefits of the procedure and the                         sedation options and risks were discussed with the                         patient. All questions were answered and informed                         consent was obtained.                        - ASA Grade Assessment: II - A patient with mild                         systemic disease.                        After obtaining informed consent, the colonoscope was                         passed under direct vision. Throughout the procedure,                         the patient's blood pressure, pulse, and oxygen                         saturations were monitored continuously. The                         Colonoscope was  introduced through the anus and                         advanced to the the cecum, identified by the                         appendiceal orifice. The colonoscopy was performed                         with ease. The patient tolerated the procedure well.                          The quality of the bowel preparation was adequate. The                         ileocecal valve, appendiceal orifice, and rectum were                         photographed. Findings:      The perianal and digital rectal examinations were normal.      A 10 mm polyp was found in the cecum. The polyp was sessile. The polyp       was removed with a hot snare. Resection and retrieval were complete.      The exam was otherwise without abnormality on direct and retroflexion       views. Impression:            - One 10 mm polyp in the cecum, removed with a hot                         snare. Resected and retrieved.                        - The examination was otherwise normal on direct and                         retroflexion views. Recommendation:        - Discharge patient to home (with escort).                        - Resume previous diet.                        - Continue present medications.                        - Await pathology results.                        - Repeat colonoscopy in 3 years for surveillance. Procedure Code(s):     --- Professional ---                        [phone], Colonoscopy, flexible; with removal of                         tumor(s), polyp(s), or other lesion(s) by snare                         technique Diagnosis Code(s):     ---  Professional ---                        Z12.11, Encounter for screening for malignant neoplasm                         of colon                        D12.0, Benign neoplasm of cecum CPT copyright 2022 American Medical Association. All rights reserved. The codes documented in this report are preliminary and upon coder review may  be revised to meet current compliance requirements. Wyline Mood, MD Wyline Mood MD, MD 07/07/2023 69:62:95 AM This report has been signed electronically. Number of Addenda: 0 Note Initiated On: 07/07/2023 9:54 AM Scope Withdrawal Time: 0 hours 13 minutes 59 seconds  Total Procedure Duration:  0 hours 19 minutes 59 seconds  Estimated Blood Loss:  Estimated blood loss: none.      [facility]

## 2023-07-07 NOTE — Transfer of Care (Signed)
Immediate Anesthesia Transfer of Care Note  Patient: Walter Hodges  Procedure(s) Performed: COLONOSCOPY WITH PROPOFOL POLYPECTOMY HOT HEMOSTASIS (ARGON PLASMA COAGULATION/BICAP)  Patient Location: PACU  Anesthesia Type:General  Level of Consciousness: sedated  Airway & Oxygen Therapy: Patient Spontanous Breathing  Post-op Assessment: Report given to RN and Post -op Vital signs reviewed and stable  Post vital signs: Reviewed and stable  Last Vitals:  Vitals Value Taken Time  BP 102/59 07/07/23 1027  Temp 36.2 C 07/07/23 1026  Pulse 63 07/07/23 1028  Resp 16 07/07/23 1028  SpO2 98 % 07/07/23 1028  Vitals shown include unfiled device data.  Last Pain:  Vitals:   07/07/23 1026  TempSrc: Temporal  PainSc: Asleep         Complications: No notable events documented.

## 2023-07-07 NOTE — Anesthesia Postprocedure Evaluation (Signed)
Anesthesia Post Note  Patient: SAMVIT PEOT  Procedure(s) Performed: COLONOSCOPY WITH PROPOFOL POLYPECTOMY HOT HEMOSTASIS (ARGON PLASMA COAGULATION/BICAP)  Patient location during evaluation: PACU Anesthesia Type: General Level of consciousness: awake and awake and alert Pain management: satisfactory to patient Vital Signs Assessment: post-procedure vital signs reviewed and stable Respiratory status: spontaneous breathing Cardiovascular status: stable Anesthetic complications: no   No notable events documented.   Last Vitals:  Vitals:   07/07/23 0945 07/07/23 1026  BP: (!) 143/93 (!) 102/59  Pulse: 74 70  Resp: 18 18  Temp: (!) 36.1 C (!) 36.2 C  SpO2: 100% 98%    Last Pain:  Vitals:   07/07/23 1026  TempSrc: Temporal  PainSc: Asleep                 VAN STAVEREN,Anum Palecek

## 2023-07-07 NOTE — H&P (Signed)
Wyline Mood, MD [address], [address], [place], [place], [address] [address], [address], [place], [place], [address] Phone: [phone]  Fax: [phone]  Primary Care Physician:  Emogene Morgan, MD   Pre-Procedure History & Physical: HPI:  Walter Hodges is a 67 y.o. male is here for an colonoscopy.   Past Medical History:  Diagnosis Date   Arthritis    Bell's palsy 2010   lasted one day ago, told that he had a ministroke   Dysrhythmia    atrial fib   GERD (gastroesophageal reflux disease)    Headache    Hollenhorst plaque, right eye    Hypertension    followed by Dr. Nita Sells" with [facility]      Past Surgical History:  Procedure Laterality Date   CARDIOVERSION N/A 05/29/2020   Procedure: CARDIOVERSION;  Surgeon: Alwyn Pea, MD;  Location: [facility] ORS;  Service: Cardiovascular;  Laterality: N/A;   CIRCUMCISION     done as a child- given inhalation    COLONOSCOPY     KNEE CLOSED REDUCTION Left 03/19/2016   Procedure: CLOSED MANIPULATION LEFT KNEE;  Surgeon: Tarry Kos, MD;  [facility];  Service: Orthopedics;  Laterality: Left;   LEFT HEART CATH AND CORONARY ANGIOGRAPHY N/A 11/06/2020   Procedure: LEFT HEART CATH AND CORONARY ANGIOGRAPHY;  Surgeon: Alwyn Pea, MD;  Location: [facility] INVASIVE CV LAB;  Service: Cardiovascular;  Laterality: N/A;   REVERSE SHOULDER ARTHROPLASTY Left 07/02/2022   Procedure: Left REVERSE total SHOULDER ARTHROPLASTY;  Surgeon: Beverely Low, MD;  Location: [facility];  Service: Orthopedics;  Laterality: Left;  120 min Choice and interscalene block   TEE WITHOUT CARDIOVERSION N/A 02/19/2016   Procedure: TRANSESOPHAGEAL ECHOCARDIOGRAM (TEE);  Surgeon: Thurmon Fair, MD;  Location: [facility] ENDOSCOPY;  Service: Cardiovascular;  Laterality: N/A;   TOTAL KNEE ARTHROPLASTY Right 05/26/2015   Procedure: RIGHT TOTAL KNEE ARTHROPLASTY;  Surgeon: Tarry Kos, MD;  [facility];  Service: Orthopedics;  Laterality: Right;   TOTAL  KNEE ARTHROPLASTY Left 01/01/2016   Procedure: LEFT TOTAL KNEE ARTHROPLASTY;  Surgeon: Tarry Kos, MD;  [facility];  Service: Orthopedics;  Laterality: Left;    Prior to Admission medications   Medication Sig Start Date End Date Taking? Authorizing Provider  amiodarone (PACERONE) 200 MG tablet Take 200 mg by mouth daily. 03/07/22  Yes [provider]  amLODipine (NORVASC) 5 MG tablet Take 5 mg by mouth daily. 06/15/21  Yes [provider]  metoprolol succinate (TOPROL-XL) 25 MG 24 hr tablet Take 25 mg by mouth daily. 04/24/21  Yes [provider]  AMBULATORY NON FORMULARY MEDICATION Trimix (30/1/50)-(Pap/Phent/PGE)  Dosage: Inject 1 cc per injection   Vial 1ml  Qty #10 Refills 6  [facility] [phone] Fax [phone] 06/08/23   Michiel Cowboy A, PA-C  docusate sodium (COLACE) 100 MG capsule Take 1 capsule (100 mg total) by mouth 2 (two) times daily as needed for mild constipation. 07/03/22   Dorothy Spark, PA-C  ELIQUIS 5 MG TABS tablet Take 5 mg by mouth 2 (two) times daily. 01/22/20   [provider]  EPINEPHrine 0.3 mg/0.3 mL IJ SOAJ injection Inject 0.3 mg into the muscle as needed for anaphylaxis. 07/29/21   Gilles Chiquito, MD  ibuprofen (ADVIL) 800 MG tablet Take 800 mg by mouth 3 (three) times daily. 11/17/22   [provider]  linaclotide (LINZESS) 290 MCG CAPS capsule Take 1 capsule (290 mcg total) by mouth daily  before breakfast. 05/17/23 11/13/23  Celso Amy, PA-C  nitroGLYCERIN (NITROSTAT) 0.4 MG SL tablet Place 0.4 mg under the tongue every 5 (five) minutes x 3 doses as needed for chest pain. 10/30/20   [provider]  testosterone cypionate (DEPOTESTOSTERONE CYPIONATE) 200 MG/ML injection Inject 0.5 mLs (100 mg total) into the muscle every 14 (fourteen) days. 02/24/23   Vaillancourt, Lelon Mast, PA-C  triamcinolone cream (KENALOG) 0.1 % Apply 1 Application topically as needed (pain).    [provider]    Allergies as of 05/17/2023 - Review Complete 05/17/2023  Allergen Reaction Noted   Ace inhibitors Swelling 07/29/2021    Family History  Problem Relation Age of Onset   Headache Neg Hx     Social History   Socioeconomic History   Marital status: Married    Spouse name: Not on file   Number of children: 1   Years of education: Not on file   Highest education level: Not on file  Occupational History   Occupation: Part-time    Comment: Couple days a week  Tobacco Use   Smoking status: Never   Smokeless tobacco: Never  Vaping Use   Vaping status: Never Used  Substance and Sexual Activity   Alcohol use: No    Comment: Socially in the past   Drug use: No   Sexual activity: Yes  Other Topics Concern   Not on file  Social History Narrative   Lives at home w/ his aunt   Right-handed   Caffeine: occasional hot drinks in the winter, [place] or [place]. Dew   Social Determinants of Health   Financial Resource Strain: Not on file  Food Insecurity: No Food Insecurity (07/02/2022)   Hunger Vital Sign    Worried About Running Out of Food in the Last Year: Never true    Ran Out of Food in the Last Year: Never true  Transportation Needs: No Transportation Needs (07/02/2022)   PRAPARE - Administrator, Civil Service (Medical): No    Lack of Transportation (Non-Medical): No  Physical Activity: Not on file  Stress: Not on file  Social Connections: Not on file  Intimate Partner Violence: Not At Risk (07/02/2022)   Humiliation, Afraid, Rape, and Kick questionnaire    Fear of Current or Ex-Partner: No    Emotionally Abused: No    Physically Abused: No    Sexually Abused: No    Review of Systems: See HPI, otherwise negative ROS  Physical Exam: BP (!) 143/93   Pulse 74   Temp (!) 96.9 F (36.1 C) (Temporal)   Resp 18   Ht 5\' 10"  (1.778 m)   Wt 110 kg   SpO2 100%   BMI 34.78 kg/m  General:   Alert,  pleasant and cooperative in NAD Head:   Normocephalic and atraumatic. Neck:  Supple; no masses or thyromegaly. Lungs:  Clear throughout to auscultation, normal respiratory effort.    Heart:  +S1, +S2, Regular rate and rhythm, No edema. Abdomen:  Soft, nontender and nondistended. Normal bowel sounds, without guarding, and without rebound.   Neurologic:  Alert and  oriented x4;  grossly normal neurologically.  Impression/Plan: Walter Hodges is here for an colonoscopy to be performed for Screening colonoscopy average risk   Risks, benefits, limitations, and alternatives regarding  colonoscopy have been reviewed with the patient.  Questions have been answered.  All parties agreeable.   Wyline Mood, MD  07/07/2023, 9:54 AM

## 2023-07-07 NOTE — Anesthesia Preprocedure Evaluation (Signed)
Anesthesia Evaluation  Patient identified by MRN, date of birth, ID band Patient awake    Reviewed: Allergy & Precautions, NPO status , Patient's Chart, lab work & pertinent test results  Airway Mallampati: II  TM Distance: >3 FB Neck ROM: Full    Dental  (+) Teeth Intact   Pulmonary neg pulmonary ROS   Pulmonary exam normal breath sounds clear to auscultation       Cardiovascular Exercise Tolerance: Good hypertension, Pt. on medications negative cardio ROS Normal cardiovascular exam+ dysrhythmias Atrial Fibrillation  Rhythm:Irregular     Neuro/Psych  Headaches negative neurological ROS  negative psych ROS   GI/Hepatic negative GI ROS, Neg liver ROS,GERD  Medicated,,  Endo/Other  negative endocrine ROS    Renal/GU negative Renal ROS  negative genitourinary   Musculoskeletal   Abdominal  (+) + obese  Peds negative pediatric ROS (+)  Hematology negative hematology ROS (+)   Anesthesia Other Findings Past Medical History: No date: Arthritis 2010: Bell's palsy     Comment:  lasted one day ago, told that he had a ministroke No date: Dysrhythmia     Comment:  atrial fib No date: GERD (gastroesophageal reflux disease) No date: Headache 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 Nita Sells" with [facility].    Past Surgical History: 05/29/2020: CARDIOVERSION; N/A     Comment:  Procedure: CARDIOVERSION;  Surgeon: Alwyn Pea,               MD;  Location: [facility] ORS;  Service: Cardiovascular;                Laterality: N/A; No date: CIRCUMCISION     Comment:  done as a child- given inhalation  No date: COLONOSCOPY 03/19/2016: KNEE CLOSED REDUCTION; Left     Comment:  Procedure: CLOSED MANIPULATION LEFT KNEE;  Surgeon:               Tarry Kos, MD;  [facility];  Service:               Orthopedics;  Laterality: Left; 11/06/2020: LEFT HEART CATH AND  CORONARY ANGIOGRAPHY; N/A     Comment:  Procedure: LEFT HEART CATH AND CORONARY ANGIOGRAPHY;                Surgeon: Alwyn Pea, MD;  Location: [facility] INVASIVE              CV LAB;  Service: Cardiovascular;  Laterality: N/A; 07/02/2022: REVERSE SHOULDER ARTHROPLASTY; Left     Comment:  Procedure: Left REVERSE total SHOULDER ARTHROPLASTY;                Surgeon: Beverely Low, MD;  Location: [facility];  Service:               Orthopedics;  Laterality: Left;  120 min Choice and               interscalene block 02/19/2016: TEE WITHOUT CARDIOVERSION; N/A     Comment:  Procedure: TRANSESOPHAGEAL ECHOCARDIOGRAM (TEE);                Surgeon: Thurmon Fair, MD;  Location: [facility] ENDOSCOPY;                Service: Cardiovascular;  Laterality: N/A; 05/26/2015: TOTAL KNEE ARTHROPLASTY; Right     Comment:  Procedure: RIGHT TOTAL KNEE ARTHROPLASTY;  Surgeon:  Tarry Kos, MD;  [facility];  Service:               Orthopedics;  Laterality: Right; 01/01/2016: TOTAL KNEE ARTHROPLASTY; Left     Comment:  Procedure: LEFT TOTAL KNEE ARTHROPLASTY;  Surgeon:               Tarry Kos, MD;  [facility];  Service:               Orthopedics;  Laterality: Left;  BMI    Body Mass Index: 34.78 kg/m      Reproductive/Obstetrics negative OB ROS                             Anesthesia Physical Anesthesia Plan  ASA: 3  Anesthesia Plan: General   Post-op Pain Management:    Induction: Intravenous  PONV Risk Score and Plan: Propofol infusion and TIVA  Airway Management Planned: Natural Airway and Nasal Cannula  Additional Equipment:   Intra-op Plan:   Post-operative Plan:   Informed Consent: I have reviewed the patients History and Physical, chart, labs and discussed the procedure including the risks, benefits and alternatives for the proposed anesthesia with the patient or authorized representative who has indicated his/her understanding and  acceptance.     Dental Advisory Given  Plan Discussed with: CRNA and Surgeon  Anesthesia Plan Comments:        Anesthesia Quick Evaluation

## 2023-07-08 ENCOUNTER — Ambulatory Visit (INDEPENDENT_AMBULATORY_CARE_PROVIDER_SITE_OTHER): Payer: 59 | Admitting: Urology

## 2023-07-08 DIAGNOSIS — E349 Endocrine disorder, unspecified: Secondary | ICD-10-CM

## 2023-07-08 DIAGNOSIS — E291 Testicular hypofunction: Secondary | ICD-10-CM | POA: Diagnosis not present

## 2023-07-08 LAB — SURGICAL PATHOLOGY

## 2023-07-08 MED ORDER — TESTOSTERONE CYPIONATE 200 MG/ML IM SOLN
100.0000 mg | Freq: Once | INTRAMUSCULAR | Status: AC
  Administered 2023-07-08: 100 mg via INTRAMUSCULAR

## 2023-07-08 NOTE — Progress Notes (Signed)
Testosterone IM Injection  Due to Hypogonadism patient is present today for a Testosterone Injection.  Medication: Testosterone Cypionate Dose: 0.5 ml Location: right upper outer buttocks Lot: [id number] Exp:08/06/2024  Patient tolerated well, no complications were noted  Performed by: Gerda Yin Magallon-Mariche,RMA  Follow up: 2 weeks

## 2023-07-11 ENCOUNTER — Encounter: Payer: Self-pay | Admitting: Gastroenterology

## 2023-07-22 ENCOUNTER — Ambulatory Visit: Payer: 59 | Admitting: Urology

## 2023-07-22 DIAGNOSIS — E291 Testicular hypofunction: Secondary | ICD-10-CM

## 2023-07-22 DIAGNOSIS — E349 Endocrine disorder, unspecified: Secondary | ICD-10-CM

## 2023-07-22 MED ORDER — TESTOSTERONE CYPIONATE 200 MG/ML IM SOLN
200.0000 mg | Freq: Once | INTRAMUSCULAR | Status: AC
  Administered 2023-07-22: 200 mg via INTRAMUSCULAR

## 2023-07-22 NOTE — Progress Notes (Signed)
Patient ID: Walter Hodges, male   DOB: 1955-09-09, 67 y.o.   MRN: [id number] Testosterone IM Injection  Due to Hypogonadism patient is present today for a Testosterone Injection.  Medication: Testosterone Cypionate Dose: 0.52ml Location: right upper outer buttocks Lot: [id number]  Patient tolerated well, no complications were noted  Performed by: Mervin Hack, CMA  Follow up: 2 weeks

## 2023-07-26 ENCOUNTER — Other Ambulatory Visit: Payer: Self-pay | Admitting: Physician Assistant

## 2023-07-26 DIAGNOSIS — E349 Endocrine disorder, unspecified: Secondary | ICD-10-CM

## 2023-08-08 ENCOUNTER — Ambulatory Visit: Payer: 59 | Admitting: Physician Assistant

## 2023-08-15 ENCOUNTER — Ambulatory Visit: Payer: 59 | Admitting: Physician Assistant

## 2023-08-16 ENCOUNTER — Ambulatory Visit (INDEPENDENT_AMBULATORY_CARE_PROVIDER_SITE_OTHER): Payer: 59 | Admitting: Urology

## 2023-08-16 DIAGNOSIS — E291 Testicular hypofunction: Secondary | ICD-10-CM | POA: Diagnosis not present

## 2023-08-16 MED ORDER — TESTOSTERONE CYPIONATE 200 MG/ML IM SOLN
100.0000 mg | Freq: Once | INTRAMUSCULAR | Status: AC
  Administered 2023-08-16: 100 mg via INTRAMUSCULAR

## 2023-08-16 NOTE — Progress Notes (Signed)
Testosterone IM Injection  Due to Hypogonadism patient is present today for a Testosterone Injection.  Medication: Testosterone Cypionate Dose: 0.5 ml Location: right upper outer buttocks Lot: [id number] Exp:03/06/2025  Patient tolerated well, no complications were noted  Performed by: Thedford Bunton Magallon-Mariche  Follow up: 2 weeks

## 2023-09-06 ENCOUNTER — Encounter: Payer: Self-pay | Admitting: Physician Assistant

## 2023-09-06 ENCOUNTER — Ambulatory Visit: Payer: 59 | Admitting: Physician Assistant

## 2023-09-13 ENCOUNTER — Ambulatory Visit: Payer: 59 | Admitting: Urology

## 2023-09-14 ENCOUNTER — Ambulatory Visit: Payer: 59 | Admitting: Urology

## 2023-09-14 DIAGNOSIS — E291 Testicular hypofunction: Secondary | ICD-10-CM | POA: Diagnosis not present

## 2023-09-14 DIAGNOSIS — E349 Endocrine disorder, unspecified: Secondary | ICD-10-CM

## 2023-09-14 MED ORDER — TESTOSTERONE CYPIONATE 200 MG/ML IM SOLN
200.0000 mg | Freq: Once | INTRAMUSCULAR | Status: AC
  Administered 2023-09-14: 200 mg via INTRAMUSCULAR

## 2023-09-14 NOTE — Progress Notes (Signed)
 Testosterone IM Injection  Due to Hypogonadism patient is present today for a Testosterone Injection.  Medication: Testosterone Cypionate Dose: 200mg  Location: right upper outer buttocks Lot: [id number] Exp:04/04/25  Patient tolerated well, no complications were noted  Performed by: Tarsha Valentine,CMA  Follow up: 2 weeks

## 2023-09-15 LAB — TESTOSTERONE: Testosterone: 518 ng/dL (ref 264–916)

## 2023-09-15 LAB — HEMOGLOBIN AND HEMATOCRIT, BLOOD
Hematocrit: 40.6 % (ref 37.5–51.0)
Hemoglobin: 13.4 g/dL (ref 13.0–17.7)

## 2023-09-16 ENCOUNTER — Other Ambulatory Visit: Payer: Self-pay | Admitting: *Deleted

## 2023-09-16 DIAGNOSIS — E349 Endocrine disorder, unspecified: Secondary | ICD-10-CM

## 2023-09-16 DIAGNOSIS — N138 Other obstructive and reflux uropathy: Secondary | ICD-10-CM

## 2023-09-16 DIAGNOSIS — E291 Testicular hypofunction: Secondary | ICD-10-CM

## 2023-09-16 DIAGNOSIS — N5201 Erectile dysfunction due to arterial insufficiency: Secondary | ICD-10-CM

## 2023-09-21 NOTE — Progress Notes (Signed)
Testosterone IM Injection  Due to Hypogonadism patient is present today for a Testosterone Injection.  Medication: Testosterone Cypionate Dose: 200mg  Location: right upper outer buttocks Lot: [id number] Exp:04/04/25  Patient tolerated well, no complications were noted  Performed by: Tarsha Valentine,CMA  Follow up: 2 weeks

## 2023-09-22 ENCOUNTER — Ambulatory Visit: Payer: 59 | Admitting: Physician Assistant

## 2023-09-27 ENCOUNTER — Ambulatory Visit: Payer: 59 | Admitting: Urology

## 2023-09-30 ENCOUNTER — Ambulatory Visit: Payer: 59 | Admitting: Urology

## 2023-09-30 DIAGNOSIS — E291 Testicular hypofunction: Secondary | ICD-10-CM

## 2023-09-30 NOTE — Progress Notes (Signed)
Testosterone IM Injection  Due to Hypogonadism patient is present today for a Testosterone Injection.  Medication: Testosterone Cypionate Dose: 0.5 mL Location: right upper outer buttocks Lot: [id number] Exp:0701/2026  Patient tolerated well, no complications were noted  Performed by: Yasmin Bronaugh Magallon-Mariche,RMA  Follow up: 2 weeks

## 2023-10-03 ENCOUNTER — Ambulatory Visit: Payer: Medicaid Other | Admitting: Podiatry

## 2023-10-04 DIAGNOSIS — E291 Testicular hypofunction: Secondary | ICD-10-CM | POA: Diagnosis not present

## 2023-10-04 MED ORDER — TESTOSTERONE CYPIONATE 200 MG/ML IM SOLN
200.0000 mg | Freq: Once | INTRAMUSCULAR | Status: AC
  Administered 2023-10-04: 200 mg via INTRAMUSCULAR

## 2023-10-04 NOTE — Addendum Note (Signed)
Addended byRanda Lynn on: 10/04/2023 09:10 AM   Modules accepted: Orders

## 2023-10-14 ENCOUNTER — Ambulatory Visit (INDEPENDENT_AMBULATORY_CARE_PROVIDER_SITE_OTHER): Payer: 59 | Admitting: Urology

## 2023-10-14 DIAGNOSIS — E291 Testicular hypofunction: Secondary | ICD-10-CM | POA: Diagnosis not present

## 2023-10-14 MED ORDER — TESTOSTERONE CYPIONATE 200 MG/ML IM SOLN
100.0000 mg | Freq: Once | INTRAMUSCULAR | Status: AC
  Administered 2023-10-14: 100 mg via INTRAMUSCULAR

## 2023-10-14 NOTE — Progress Notes (Signed)
 Testosterone  IM Injection  Due to Hypogonadism patient is present today for a Testosterone  Injection.  Medication: Testosterone  Cypionate Dose: 0.5 ml Location: right upper outer buttocks Lot: [id number] Exp:03/06/2025  Patient tolerated well, no complications were noted  Performed by: Deangela Randleman Magallon-Mariche,RMA  Follow up: 2 weeks,

## 2023-10-28 ENCOUNTER — Ambulatory Visit: Payer: 59 | Admitting: Urology

## 2023-12-13 ENCOUNTER — Other Ambulatory Visit: Payer: 59

## 2023-12-15 ENCOUNTER — Ambulatory Visit: Payer: Self-pay | Admitting: Urology

## 2023-12-15 NOTE — Progress Notes (Deleted)
 12/15/23 11:16 AM   Walter Hodges Nov 01, 1955 [id number]  Referring provider:  Emogene Morgan, MD [address] [place],  [place] [address]  Urological history: 1.  Hypogonadism -contributing factors of age and obesity -testosterone pending  -hemoglobin/hematocrit pending  -testosterone cypionate 200 mg/mL, 0.5 cc every 14 days   2. ED -contributing factors of age, HTN, BPH, testosterone deficiency and obesity -failed PDE5i's -Trimix (30/1/50) 1 cc prior to intercourse    3. BPH with LU TS - PSA pending   No chief complaint on file.   HPI: Walter Hodges is a 68 y.o.male who presents today for 3 month follow up.    Previous records reviewed.    He feels the testosterone cypionate injections are working for him.  I PSS ***      Score:  1-7 Mild 8-19 Moderate 20-35 Severe  SHIM ***   Score: 1-7 Severe ED 8-11 Moderate ED 12-16 Mild-Moderate ED 17-21 Mild ED 22-25 No ED   PMH: Past Medical History:  Diagnosis Date   Arthritis    Bell's palsy 2010   lasted one day ago, told that he had a ministroke   Dysrhythmia    atrial fib   GERD (gastroesophageal reflux disease)    Headache    Hollenhorst plaque, right eye    Hypertension    followed by Dr. Nita Sells" with [facility]      Surgical History: Past Surgical History:  Procedure Laterality Date   CARDIOVERSION N/A 05/29/2020   Procedure: CARDIOVERSION;  Surgeon: Alwyn Pea, MD;  Location: [facility] ORS;  Service: Cardiovascular;  Laterality: N/A;   CIRCUMCISION     done as a child- given inhalation    COLONOSCOPY     COLONOSCOPY WITH PROPOFOL N/A 07/07/2023   Procedure: COLONOSCOPY WITH PROPOFOL;  Surgeon: Wyline Mood, MD;  Location: [facility] ENDOSCOPY;  Service: Gastroenterology;  Laterality: N/A;   HOT HEMOSTASIS  07/07/2023   Procedure: HOT HEMOSTASIS (ARGON PLASMA COAGULATION/BICAP);  Surgeon: Wyline Mood, MD;  Location: [facility] ENDOSCOPY;  Service: Gastroenterology;;   KNEE  CLOSED REDUCTION Left 03/19/2016   Procedure: CLOSED MANIPULATION LEFT KNEE;  Surgeon: Tarry Kos, MD;  [facility];  Service: Orthopedics;  Laterality: Left;   LEFT HEART CATH AND CORONARY ANGIOGRAPHY N/A 11/06/2020   Procedure: LEFT HEART CATH AND CORONARY ANGIOGRAPHY;  Surgeon: Alwyn Pea, MD;  Location: [facility] INVASIVE CV LAB;  Service: Cardiovascular;  Laterality: N/A;   POLYPECTOMY  07/07/2023   Procedure: POLYPECTOMY;  Surgeon: Wyline Mood, MD;  Location: [facility] ENDOSCOPY;  Service: Gastroenterology;;   REVERSE SHOULDER ARTHROPLASTY Left 07/02/2022   Procedure: Left REVERSE total SHOULDER ARTHROPLASTY;  Surgeon: Beverely Low, MD;  Location: [facility];  Service: Orthopedics;  Laterality: Left;  120 min Choice and interscalene block   TEE WITHOUT CARDIOVERSION N/A 02/19/2016   Procedure: TRANSESOPHAGEAL ECHOCARDIOGRAM (TEE);  Surgeon: Thurmon Fair, MD;  Location: [facility] ENDOSCOPY;  Service: Cardiovascular;  Laterality: N/A;   TOTAL KNEE ARTHROPLASTY Right 05/26/2015   Procedure: RIGHT TOTAL KNEE ARTHROPLASTY;  Surgeon: Tarry Kos, MD;  [facility];  Service: Orthopedics;  Laterality: Right;   TOTAL KNEE ARTHROPLASTY Left 01/01/2016   Procedure: LEFT TOTAL KNEE ARTHROPLASTY;  Surgeon: Tarry Kos, MD;  [facility];  Service: Orthopedics;  Laterality: Left;    Home Medications:  Allergies as of 12/16/2023       Reactions   Ace Inhibitors Swelling        Medication List  Accurate as of December 15, 2023 11:16 AM. If you have any questions, ask your nurse or doctor.          AMBULATORY NON FORMULARY MEDICATION Trimix (30/1/50)-(Pap/Phent/PGE)  Dosage: Inject 1 cc per injection   Vial 1ml  Qty #10 Refills 6  [facility] [phone] Fax [phone]   amiodarone 200 MG tablet Commonly known as: PACERONE Take 200 mg by mouth daily.   amLODipine 5 MG tablet Commonly known as: NORVASC Take 5 mg by mouth daily.   atorvastatin 10 MG  tablet Commonly known as: LIPITOR Take 10 mg by mouth daily.   Eliquis 5 MG Tabs tablet Generic drug: apixaban Take 5 mg by mouth 2 (two) times daily.   EPINEPHrine 0.3 mg/0.3 mL Soaj injection Commonly known as: EPI-PEN Inject 0.3 mg into the muscle as needed for anaphylaxis.   ibuprofen 800 MG tablet Commonly known as: ADVIL Take 800 mg by mouth 3 (three) times daily.   indomethacin 50 MG capsule Commonly known as: INDOCIN Take by mouth.   linaclotide 290 MCG Caps capsule Commonly known as: Linzess Take 1 capsule (290 mcg total) by mouth daily before breakfast.   losartan 100 MG tablet Commonly known as: COZAAR Take 100 mg by mouth daily.   methocarbamol 500 MG tablet Commonly known as: ROBAXIN Take 500 mg by mouth 3 (three) times daily.   oxyCODONE-acetaminophen 10-325 MG tablet Commonly known as: PERCOCET TAKE 1 TABLET BY MOUTH EVERY 4-6 HRS AS NEEDED FOR PAIN MAX DAILY DOSE OF 5 TABLET   testosterone cypionate 200 MG/ML injection Commonly known as: DEPOTESTOSTERONE CYPIONATE INJECT 0.5 MLS (100MG  TOTAL) INTO THE MUSCLE EVERY 14 DAYS. DISCARD EACH VIAL AFTER SINGLE USE.   Viagra 100 MG tablet Generic drug: sildenafil Take by mouth.        Allergies:  Allergies  Allergen Reactions   Ace Inhibitors Swelling    Family History: Family History  Problem Relation Age of Onset   Headache Neg Hx     Social History:  reports that he has never smoked. He has never used smokeless tobacco. He reports that he does not drink alcohol and does not use drugs.   Physical Exam: There were no vitals taken for this visit.  Constitutional:  Well nourished. Alert and oriented, No acute distress. HEENT: [place] AT, moist mucus membranes.  Trachea midline, no masses. Cardiovascular: No clubbing, cyanosis, or edema. Respiratory: Normal respiratory effort, no increased work of breathing. GI: Abdomen is soft, non tender, non distended, no abdominal masses. Liver and spleen not  palpable.  No hernias appreciated.  Stool sample for occult testing is not indicated.   GU: No CVA tenderness.  No bladder fullness or masses.  Patient with circumcised/uncircumcised phallus. ***Foreskin easily retracted***  Urethral meatus is patent.  No penile discharge. No penile lesions or rashes. Scrotum without lesions, cysts, rashes and/or edema.  Testicles are located scrotally bilaterally. No masses are appreciated in the testicles. Left and right epididymis are normal. Rectal: Patient with  normal sphincter tone. Anus and perineum without scarring or rashes. No rectal masses are appreciated. Prostate is approximately *** grams, *** nodules are appreciated. Seminal vesicles are normal. Skin: No rashes, bruises or suspicious lesions. Lymph: No cervical or inguinal adenopathy. Neurologic: Grossly intact, no focal deficits, moving all 4 extremities. Psychiatric: Normal mood and affect.   Laboratory Data: Pending  I have reviewed the labs.   Pertinent imaging: N/A  Assessment & Plan:    1. Hypogonadism -Testosterone levels pending  -  Hemoglobin/hematocrit pending  -continue testosterone cypionate 200 mg/mL, 0.5 cc every 14 days    2. BPH with LU TS -moderate symptoms, but overall satisfied  -PSA pending    3. Erectile dysfunction:    -mild symptoms -continue Trimix (30/1/50)-1 cc prn for intercourse  No follow-ups on file.    Cloretta Ned   [facility] [address], [address] [place], [place] [address] [phone]

## 2023-12-16 ENCOUNTER — Ambulatory Visit: Admitting: Urology

## 2023-12-16 DIAGNOSIS — E291 Testicular hypofunction: Secondary | ICD-10-CM

## 2023-12-16 DIAGNOSIS — N138 Other obstructive and reflux uropathy: Secondary | ICD-10-CM

## 2023-12-16 DIAGNOSIS — N5201 Erectile dysfunction due to arterial insufficiency: Secondary | ICD-10-CM

## 2024-02-24 ENCOUNTER — Other Ambulatory Visit: Payer: Self-pay

## 2024-02-24 ENCOUNTER — Encounter: Payer: Self-pay | Admitting: Emergency Medicine

## 2024-02-24 DIAGNOSIS — I10 Essential (primary) hypertension: Secondary | ICD-10-CM | POA: Insufficient documentation

## 2024-02-24 DIAGNOSIS — S39012A Strain of muscle, fascia and tendon of lower back, initial encounter: Secondary | ICD-10-CM | POA: Diagnosis not present

## 2024-02-24 DIAGNOSIS — M545 Low back pain, unspecified: Secondary | ICD-10-CM | POA: Diagnosis present

## 2024-02-24 DIAGNOSIS — Y92410 Unspecified street and highway as the place of occurrence of the external cause: Secondary | ICD-10-CM | POA: Insufficient documentation

## 2024-02-24 DIAGNOSIS — Z7901 Long term (current) use of anticoagulants: Secondary | ICD-10-CM | POA: Diagnosis not present
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
## 2024-02-25 ENCOUNTER — Emergency Department

## 2024-02-25 ENCOUNTER — Emergency Department
Admission: EM | Admit: 2024-02-25 | Discharge: 2024-02-25 | Disposition: A | Discharge status: Home / Self Care | Attending: Emergency Medicine | Admitting: Emergency Medicine

## 2024-02-25 DIAGNOSIS — S39012A Strain of muscle, fascia and tendon of lower back, initial encounter: Secondary | ICD-10-CM

## 2024-02-25 DIAGNOSIS — M5432 Sciatica, left side: Secondary | ICD-10-CM

## 2024-02-25 MED ORDER — LIDOCAINE 5 % EX PTCH
1.0000 | MEDICATED_PATCH | CUTANEOUS | Status: DC
  Administered 2024-02-25: 1 via TRANSDERMAL
  Filled 2024-02-25: qty 1

## 2024-02-25 MED ORDER — MUSCLE RUB 10-15 % EX CREA: 1.0000 | TOPICAL_CREAM | CUTANEOUS | 0 refills | Status: AC | PRN

## 2024-02-25 MED ORDER — ACETAMINOPHEN 500 MG PO TABS
1000.0000 mg | ORAL_TABLET | Freq: Once | ORAL | Status: AC
  Administered 2024-02-25: 1000 mg via ORAL
  Filled 2024-02-25: qty 2

## 2024-02-25 MED ORDER — LIDOCAINE 5 % EX PTCH: 1.0000 | MEDICATED_PATCH | CUTANEOUS | 0 refills | Status: AC

## 2024-02-25 NOTE — Discharge Instructions (Signed)
 Take Tylenol  1000 mg 3 times daily, at breakfast lunch and dinner. Use Lidoderm  or muscle rub topically for pain control as well.  Thank you for choosing us  for your health care today!  Please see your primary doctor this week for a follow up appointment.   If you have any new, worsening, or unexpected symptoms call your doctor right away or come back to the emergency department for reevaluation.  It was my pleasure to care for you today.   Ginnie EDISON Cyrena, MD
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
## 2024-02-25 NOTE — ED Notes (Signed)
 Pt complains of back pain  States same is his lower back .8/10 pain  Pt is noted to be resting comfortably at this time.

## 2024-04-28 NOTE — Progress Notes (Signed)
 ------------------------------------------------------------------------------- Attestation with edits by Knoll, Gregory M, MD at 05/02/24 1150 Patient independently seen and examined. I agree with the resident assessment and plan. Plan discussed with patient today.   -------------------------------------------------------------------------------  ORTHOPAEDIC NEW CLINIC NOTE   ASSESSMENT: Walter Hodges is a 68 y.o. male with severe bilateral carpal tunnel syndrome.  He had an open carpal tunnel release on the right in 2023 at an outside facility.  He states that following the procedure he did not note any improvement in his symptoms.  I reviewed his EMG which was notable for profound right sided carpal tunnel syndrome.  I explained that given the severe nature of his nerve compression it is not uncommon to have no improvement following carpal tunnel release as he has permanent nerve damage.  I explained that I could perform a Camitz tendon transfer to improve his thumb opposition but he is not interested in this at this time.  He will return to see me as needed.  He has not had his left carpal tunnel release and I recommended that he proceed with left carpal tunnel release however he is not wanting to do this at this time.  PLAN: Return to clinic as needed  PROCEDURES: None  SUBJECTIVE: Chief Complaint:  Right hand pain   History of Present Illness:  Walter Hodges is a 68 y.o. male with history of profound right sided carpal tunnel syndrome.  He had an open carpal tunnel release performed at an outside facility in 2023.  He states that he did not have any improvement in his symptoms or appearance of his hand following the procedure.  He wants to know if there is anything that can be done for this.  His prior nerve conduction studies were reviewed in clinic today notable for profound right sided carpal tunnel syndrome.   Medical History  Past Medical History[1]   Surgical  History  Past Surgical History[2]  Medications  Current Medications[3]  Allergies  Patient has no known allergies.   Social History Tobacco use: denies. Alcohol use: denies. Drug use: denies.    Family History The patient's family history includes No Known Problems in his brother, father, maternal aunt, maternal grandfather, maternal grandmother, maternal uncle, mother, paternal aunt, paternal grandfather, paternal grandmother, paternal uncle, and another family member; Seizures in his sister..      Review of Systems A 10 system review of systems in addition to the musculoskeletal system was performed by intake questionnaire and was negative except as noted in HPI.   OBJECTIVE: Physical Examination:  General Well nourished, appearing stated age  Not in acute distress  Alert and oriented x3 Appropriate affect and mood Appropriate to conversation  No increased work of breathing on room air  Musculoskeletal Right Upper Extremity: Significant thenar wasting noted.  Near complete absence of sensation in the median nerve distribution.  Normal sensation in ulnar nerve distribution.  Limited thumb opposition.  Palmaris longus is present.  No hypothenar wasting or intrinsic wasting.  Left Upper Extremity: Significant thenar wasting.  Diminished sensation in the median nerve distribution.  No hypothenar or intrinsic wasting.  Positive Tinel's at the wrist.  Test Results Imaging Prior EMG reviewed in clinic findings as noted above.  Problem List Active Problems:   * No active hospital problems. *        [1] Past Medical History: Diagnosis Date   Hypertension   [2] Past Surgical History: Procedure Laterality Date   PR COLONOSCOPY FLX DX W/COLLJ SPEC WHEN PFRMD N/A  05/19/2017   Procedure: COLONOSCOPY, FLEXIBLE, PROXIMAL TO SPLENIC FLEXURE; DIAGNOSTIC, W/WO COLLECTION SPECIMEN BY BRUSH OR WASH;  Surgeon: Eleanor Dewey Sorrel, MD;  [facility];   Service: Gastroenterology   PR COLONOSCOPY W/BIOPSY SINGLE/MULTIPLE N/A 06/17/2017   Procedure: COLONOSCOPY, FLEXIBLE, PROXIMAL TO SPLENIC FLEXURE; WITH BIOPSY, SINGLE OR MULTIPLE;  Surgeon: Dallas Jama Das, MD;  Location: [facility] PROCEDURES [facility];  Service: Gastroenterology   PR COLONOSCOPY W/BIOPSY SINGLE/MULTIPLE N/A 08/12/2017   Procedure: COLONOSCOPY, FLEXIBLE, PROXIMAL TO SPLENIC FLEXURE; WITH BIOPSY, SINGLE OR MULTIPLE;  Surgeon: Alphonsa Lav, MD;  Location: [facility];  Service: Gastroenterology   PR COLSC FLX W/RMVL OF TUMOR POLYP LESION SNARE TQ N/A 06/17/2017   Procedure: COLONOSCOPY FLEX; W/REMOV TUMOR/LES BY SNARE;  Surgeon: Dallas Jama Das, MD;  Location: [facility] PROCEDURES [facility];  Service: Gastroenterology   TOTAL KNEE ARTHROPLASTY Right 05/2015   TOTAL KNEE ARTHROPLASTY Left 12/2015  [3] Current Outpatient Medications  Medication Sig Dispense Refill   amLODIPine  (NORVASC ) 10 MG tablet   1   ibuprofen  (IBU) 400 MG tablet Take 1 tablet (400 mg total) by mouth every six (6) hours as needed for pain. 30 tablet 0   meloxicam  (MOBIC ) 15 MG tablet Take 15 mg by mouth daily.  0   naproxen sodium (ALEVE) 220 mg cap Take 2 tablets by mouth.     pregabalin (LYRICA) 50 MG capsule Take 1 capsule (50 mg total) by mouth Three (3) times a day. (Patient not taking: Reported on 08/12/2017) 90 capsule 5   sildenafil , antihypertensive, (REVATIO ) 20 mg tablet   0   SUBOXONE 2-0.5 mg Film dissolve 1 FILM under the tongue every 12 hours  0   VOLTAREN 1 % gel   0   zolpidem  (AMBIEN ) 5 MG tablet   0   No current facility-administered medications for this visit.

## 2024-05-16 NOTE — Progress Notes (Signed)
 Established Patient Visit   Chief Complaint: Chief Complaint  Patient presents with   Follow-up    12 month follow up   Date of Service: 05/24/2024 Date of Birth: February 19, 1956 PCP: Lorel Byars Achirimofor, MD  History of Present Illness: Mr. Walter Hodges is a 68 y.o.male patient who presents for a 1 year follow up. PMH significant for PAF, hypertension, obesity, hx of CVA.  Today, pt presents with no new cardiac concerns. Patient states that he is doing well. Denies having any recent chest pain, SOB. Patient has history of cardioversion and ablation. He has not noticed any feelings of a-fib since these procedures. Patient was able to lose some weight since his last visit. Today's EKG showed presence of a-fib. After reviewing past EKGs, it seems evident that his a-fib is intermittent. Patient will call if he starts noticing any a-fib symptoms. Encouraged to lose around 10-15 more pounds, target goal 200 pounds. No changes made today.   Visit Summaries: 06/03/2023 Patient was seen by me for a follow up and presented without any cardiac complaints. No changes were made  Past Medical and Surgical History  Past Medical History Past Medical History:  Diagnosis Date   Arthritis    Bell's palsy 2010   patient informed he had a mini stroke   Dysrhythmia    Headache    Hollenhorst plaque, right eye    Hypertension     Past Surgical History He has a past surgical history that includes Circumcision (N/A); Arthroplasty Total Knee (Right, 05/26/2015); Arthroplasty Total Knee (Left, 01/01/2016); tee without cardioversion (N/A, 02/19/2016); CLOSED REDUCTION KNEE MANIPULATION (Left, 03/19/2016); Cardioversion Int (N/A, 05/29/2020); and endoscopic carpal tunnel release (Right, 11/27/2021).   Medications and Allergies  Current Medications  Current Outpatient Medications  Medication Sig Dispense Refill   AMIOdarone  (PACERONE ) 200 MG tablet Take 200 mg by mouth once daily     amLODIPine  (NORVASC )  5 MG tablet Take 1 tablet (5 mg total) by mouth once daily for 90 days for Blood Pressure 90 tablet 3   apixaban  (ELIQUIS ) 5 mg tablet Take 1 tablet (5 mg total) by mouth every 12 (twelve) hours 180 tablet 3   diclofenac (VOLTAREN) 1 % topical gel Apply topically 3 (three) times daily 100 g 2   gabapentin (NEURONTIN) 600 MG tablet Take 1 tablet by mouth 3 (three) times daily     HYDROcodone -acetaminophen  (NORCO) 7.5-325 mg tablet Take 1 tablet by mouth every 6 (six) hours as needed for Pain 25 tablet 0   ibuprofen  (MOTRIN ) 800 MG tablet Take 1 tablet by mouth as directed     losartan  (COZAAR ) 100 MG tablet Take 1 tablet (100 mg total) by mouth once daily 90 tablet 3   oxyCODONE -acetaminophen  (PERCOCET) 10-325 mg tablet Take 1 tablet by mouth as needed     predniSONE  (DELTASONE ) 10 MG tablet Take 1 tablet (10 mg total) by mouth once daily 10 day taper, 5,5,4,4,3,3,2,2,1,1. Start with 5 tabs daily for 2 days then taper down 1 tab every 2 days. 30 tablet 0   tadalafiL  (CIALIS ) 20 MG tablet CIALIS  20 MG TABS     testosterone  cypionate (DEPO-TESTOSTERONE ) 200 mg/mL injection INJECT 1 ML IN THE MUSCLE EVERY 14 DAYS     No current facility-administered medications for this visit.    Allergies: Ace inhibitors  Social and Family History  Social History  reports that he has never smoked. He has never used smokeless tobacco. He reports that he does not currently use alcohol. He reports that he  does not use drugs.  Family History No family history on file.  Review of Systems   Pertinent positives and negatives are mentioned above in HPI and all other systems are negative.  Physical Examination   Vitals:BP 138/82   Pulse 87   Ht 177.8 cm (5' 10)   Wt 98.1 kg (216 lb 3.2 oz)   SpO2 96%   BMI 31.02 kg/m  Ht:177.8 cm (5' 10) Wt:98.1 kg (216 lb 3.2 oz) ADJ:Anib surface area is 2.2 meters squared. Body mass index is 31.02 kg/m.  HEENT: Pupils equally reactive to light and  accomodation  Neck: Supple without thyromegaly, carotid pulses 2+ Lungs: clear to auscultation bilaterally; no wheezes, rales, rhonchi Heart: Regular rate and rhythm.  No gallops, murmurs or rub Abdomen: soft nontender, nondistended, with normal bowel sounds Extremities: no cyanosis, clubbing, or edema Peripheral Pulses: 2+ in all extremities, 2+ femoral pulses bilaterally Neurologic: Alert and oriented X3; speech intact; face symmetrical; moves all extremities well  Cardiovascular Studies:    Echocardiogram 2D complete: 10/20/2021 CONCLUSIONS ------------------------------------------------------------------   1. No thrombus in the left atrial appendage   2. Biatrial enlargement   3. Patent foramen ovale present by color doppler with left to right flow   4. Mild mitral valve regurgitation with VC 0.12, EROA 0.02   5. Left ventricular ejection fraction estimated at 55%   6. Trivial TR, AR, PR      NM Myocardial Perfusion SPECT multiple (stress and rest): 02/22/2020 FINDINGS: Regional wall motion:  demonstrates  hypokinesis of the globally. The overall quality of the study is good.   Artifacts noted: no Left ventricular cavity: enlarged.   Perfusion Analysis:  SPECT images demonstrate homogeneous tracer distribution throughout the myocardium. Defect type:  Mixed   IMPRESSION: Mildly abnormal myocardial perfusion scan no evidence of stress-induced myocardial ischemia there is left ventricular enlargement ejection fraction around 39% conclusion intermediate scan    Cardiac Catheterization:  11/06/2020 Narrative  This result has an attachment that is not available.  Left heart cath normal  Normal coronary arteries  Normal cardiac cath Normal coronaries Normal cardiac pressures Ejection fraction around 55%  Holter:  Cardiac CT Scan:  Cardiac MRI:   Assessment   68 y.o. male with  1. Cerebrovascular accident (CVA), unspecified mechanism (CMS/HHS-HCC)   2.  Persistent atrial fibrillation (CMS/HHS-HCC)   3. History of prior ablation treatment   4. Essential hypertension, benign   5. Class 1 obesity due to excess calories with body mass index (BMI) of 34.0 to 34.9 in adult, unspecified whether serious comorbidity present    Plan   H x of CVA, continue amlodipine , losartan  Eliquis  and amiodarone  for A-fib PAF, hx of ablation, EKG today showed a-fib, patient has not noticed any recent a-fib symptoms, continue eliquis , amiodarone   Hypertension, today's BP was 138/82, reasonably controlled, continue amlodipine , losartan  Obesity, recommend weight loss, exercise, and portion control Status post ablation for A-fib consider referral back to EP Have the patient follow-up in 1 year    Return in about 1 year (around 05/24/2025).  This note is partially written by Leita Ellen, in the presence of and acting as the scribe of Dr. Cara Lovelace.      Leita Ellen  I have reviewed, edited and added to the note to reflect my best personal medical judgment.  Attestation Statement:   I personally performed the service. (TP)  DWAYNE JONETTA LOVELACE, MD  [facility] [place], [place] Ph:  [phone] Fax:  [phone] This note was generated in part with voice recognition software, Dragon.  I apologize for any typographical errors that were not detected and corrected from this process.  They are unintentional.
